# Patient Record
Sex: Male | Born: 1975 | Race: Black or African American | Hispanic: No | Marital: Single | Smoking: Former smoker
Health system: Southern US, Community
[De-identification: ages and names within clinical notes are randomized; demographics above are authoritative.]

## PROBLEM LIST (undated history)

## (undated) DIAGNOSIS — Z9225 Personal history of immunosupression therapy: Secondary | ICD-10-CM

## (undated) DIAGNOSIS — D709 Neutropenia, unspecified: Secondary | ICD-10-CM

## (undated) DIAGNOSIS — M069 Rheumatoid arthritis, unspecified: Secondary | ICD-10-CM

## (undated) DIAGNOSIS — L819 Disorder of pigmentation, unspecified: Secondary | ICD-10-CM

## (undated) HISTORY — DX: Disorder of pigmentation, unspecified: L81.9

## (undated) HISTORY — DX: Neutropenia, unspecified: D70.9

## (undated) HISTORY — DX: Rheumatoid arthritis, unspecified: M06.9

## (undated) HISTORY — DX: Personal history of immunosuppression therapy: Z92.25

---

## 2007-12-11 ENCOUNTER — Emergency Department: Payer: Self-pay | Admitting: Emergency Medicine

## 2008-01-12 ENCOUNTER — Ambulatory Visit: Payer: Self-pay | Admitting: Internal Medicine

## 2008-09-10 ENCOUNTER — Ambulatory Visit: Payer: Self-pay | Admitting: Internal Medicine

## 2008-10-01 ENCOUNTER — Ambulatory Visit: Payer: Self-pay | Admitting: Internal Medicine

## 2008-10-10 ENCOUNTER — Ambulatory Visit: Payer: Self-pay | Admitting: Internal Medicine

## 2008-11-26 ENCOUNTER — Ambulatory Visit: Payer: Self-pay | Admitting: Internal Medicine

## 2008-12-11 ENCOUNTER — Ambulatory Visit: Payer: Self-pay | Admitting: Internal Medicine

## 2008-12-18 ENCOUNTER — Ambulatory Visit: Payer: Self-pay | Admitting: Internal Medicine

## 2009-01-10 ENCOUNTER — Ambulatory Visit: Payer: Self-pay | Admitting: Internal Medicine

## 2016-07-16 ENCOUNTER — Other Ambulatory Visit: Payer: Self-pay | Admitting: Internal Medicine

## 2016-07-16 DIAGNOSIS — N289 Disorder of kidney and ureter, unspecified: Secondary | ICD-10-CM

## 2016-07-21 ENCOUNTER — Ambulatory Visit
Admission: RE | Admit: 2016-07-21 | Discharge: 2016-07-21 | Disposition: A | Discharge status: Home / Self Care | Payer: Self-pay | Source: Ambulatory Visit | Attending: Internal Medicine | Admitting: Internal Medicine

## 2016-07-21 DIAGNOSIS — N289 Disorder of kidney and ureter, unspecified: Secondary | ICD-10-CM | POA: Insufficient documentation

## 2019-07-09 ENCOUNTER — Ambulatory Visit: Payer: Self-pay | Attending: Internal Medicine

## 2019-07-09 DIAGNOSIS — Z23 Encounter for immunization: Secondary | ICD-10-CM

## 2019-07-09 NOTE — Progress Notes (Signed)
   Covid-19 Vaccination Clinic  Name:  Sean [name]    MRN: [id number] DOB: 04-12-1976  07/09/2019  Sean [name] was observed post Covid-19 immunization for 15 minutes without incident. He was provided with Vaccine Information Sheet and instruction to access the V-Safe system.   Sean [name] was instructed to call 911 with any severe reactions post vaccine: Marland Kitchen Difficulty breathing  . Swelling of face and throat  . A fast heartbeat  . A bad rash all over body  . Dizziness and weakness   Immunizations Administered    Name Date Dose VIS Date Route   Pfizer COVID-19 Vaccine 07/09/2019 10:53 AM 0.3 mL 03/23/2019 Intramuscular   Manufacturer: [employer], [employer]   Lot: [id number]   NDC: [address]-8

## 2019-07-30 ENCOUNTER — Ambulatory Visit: Payer: Self-pay

## 2019-07-30 ENCOUNTER — Ambulatory Visit: Payer: Self-pay | Attending: Internal Medicine

## 2019-07-30 DIAGNOSIS — Z23 Encounter for immunization: Secondary | ICD-10-CM

## 2019-07-30 NOTE — Progress Notes (Signed)
   Covid-19 Vaccination Clinic  Name:  Sean [name]    MRN: [id number] DOB: 1976/02/01  07/30/2019  [name] was observed post Covid-19 immunization for 15 minutes without incident. He was provided with Vaccine Information Sheet and instruction to access the V-Safe system.   [name] was instructed to call 911 with any severe reactions post vaccine: Marland Kitchen Difficulty breathing  . Swelling of face and throat  . A fast heartbeat  . A bad rash all over body  . Dizziness and weakness   Immunizations Administered    Name Date Dose VIS Date Route   Pfizer COVID-19 Vaccine 07/30/2019 12:08 PM 0.3 mL 06/06/2018 Intramuscular   Manufacturer: [employer], [employer]   Lot: [id number]   NDC: [address]-5

## 2021-03-26 DIAGNOSIS — M0510 Rheumatoid lung disease with rheumatoid arthritis of unspecified site: Secondary | ICD-10-CM | POA: Diagnosis present

## 2021-03-26 DIAGNOSIS — M0579 Rheumatoid arthritis with rheumatoid factor of multiple sites without organ or systems involvement: Secondary | ICD-10-CM | POA: Diagnosis present

## 2021-03-26 DIAGNOSIS — R7989 Other specified abnormal findings of blood chemistry: Secondary | ICD-10-CM

## 2021-03-26 DIAGNOSIS — D709 Neutropenia, unspecified: Secondary | ICD-10-CM | POA: Insufficient documentation

## 2021-04-24 ENCOUNTER — Other Ambulatory Visit: Payer: Self-pay | Admitting: *Deleted

## 2021-04-24 ENCOUNTER — Encounter: Payer: Self-pay | Admitting: *Deleted

## 2021-04-29 ENCOUNTER — Inpatient Hospital Stay: Payer: Self-pay | Admitting: Oncology

## 2021-04-29 ENCOUNTER — Other Ambulatory Visit: Payer: Self-pay

## 2021-04-29 ENCOUNTER — Inpatient Hospital Stay: Payer: Self-pay

## 2021-04-29 ENCOUNTER — Encounter (INDEPENDENT_AMBULATORY_CARE_PROVIDER_SITE_OTHER): Payer: Self-pay

## 2021-04-29 ENCOUNTER — Other Ambulatory Visit: Payer: Self-pay | Admitting: *Deleted

## 2021-04-29 ENCOUNTER — Inpatient Hospital Stay: Payer: Self-pay | Attending: Oncology | Admitting: Oncology

## 2021-04-29 ENCOUNTER — Encounter: Payer: Self-pay | Admitting: Oncology

## 2021-04-29 VITALS — BP 129/85 | HR 85 | Temp 98.5°F | Resp 16 | Ht 77.0 in | Wt 197.2 lb

## 2021-04-29 DIAGNOSIS — D708 Other neutropenia: Secondary | ICD-10-CM

## 2021-04-29 DIAGNOSIS — D709 Neutropenia, unspecified: Secondary | ICD-10-CM | POA: Insufficient documentation

## 2021-04-29 DIAGNOSIS — Z87891 Personal history of nicotine dependence: Secondary | ICD-10-CM | POA: Insufficient documentation

## 2021-04-29 LAB — CBC WITH DIFFERENTIAL/PLATELET
Abs Immature Granulocytes: 0.01 10*3/uL (ref 0.00–0.07)
Basophils Absolute: 0 10*3/uL (ref 0.0–0.1)
Basophils Relative: 0 %
Eosinophils Absolute: 0 10*3/uL (ref 0.0–0.5)
Eosinophils Relative: 0 %
HCT: 33.5 % — ABNORMAL LOW (ref 39.0–52.0)
Hemoglobin: 11.2 g/dL — ABNORMAL LOW (ref 13.0–17.0)
Immature Granulocytes: 0 %
Lymphocytes Relative: 10 %
Lymphs Abs: 0.4 10*3/uL — ABNORMAL LOW (ref 0.7–4.0)
MCH: 31.4 pg (ref 26.0–34.0)
MCHC: 33.4 g/dL (ref 30.0–36.0)
MCV: 93.8 fL (ref 80.0–100.0)
Monocytes Absolute: 0.5 10*3/uL (ref 0.1–1.0)
Monocytes Relative: 11 %
Neutro Abs: 3.2 10*3/uL (ref 1.7–7.7)
Neutrophils Relative %: 79 %
Platelets: 221 10*3/uL (ref 150–400)
RBC: 3.57 MIL/uL — ABNORMAL LOW (ref 4.22–5.81)
RDW: 14.5 % (ref 11.5–15.5)
WBC: 4.1 10*3/uL (ref 4.0–10.5)
nRBC: 0 % (ref 0.0–0.2)

## 2021-04-29 LAB — TECHNOLOGIST SMEAR REVIEW: Plt Morphology: ADEQUATE

## 2021-04-29 LAB — HEPATITIS B SURFACE ANTIGEN: Hepatitis B Surface Ag: NONREACTIVE

## 2021-04-29 LAB — HEPATITIS B CORE ANTIBODY, TOTAL: Hep B Core Total Ab: NONREACTIVE

## 2021-04-29 LAB — FOLATE: Folate: 13.2 ng/mL (ref >5.9)

## 2021-04-29 LAB — HEPATITIS C ANTIBODY: HCV Ab: NONREACTIVE

## 2021-04-29 LAB — VITAMIN B12: Vitamin B-12: 277 pg/mL (ref 180–914)

## 2021-04-29 NOTE — Progress Notes (Signed)
Hematology/Oncology Consult note [facility] Telephone:([phone] Fax:([phone]) [phone]  Patient Care Team: Casilda Carls, MD as PCP - General Sindy Guadeloupe, MD as Consulting Physician (Hematology and Oncology) Quintin Alto, MD as Consulting Physician (Rheumatology)   Name of the patient: Sean [name]  [id number]  Jul 14, 1975    Reason for referral-neutropenia   Referring physician-Dr. Erskine Squibb  Date of visit: 04/29/21   History of presenting illness- Patient is a 46 year old male who was seen by rheumatology Dr. Posey Pronto for history of stage II rheumatoid arthritis.  He was taking Plaquenil and prednisone for that but stopped it for a while before restarting it again.  He has been referred to [place] for neutropenia.  Most recent CBC from 04/21/2021 showed a white count of 3, H&H of 11.2/33.8 with a platelet count of 216.  Differential mainly showed both lymphopenia and neutropenia with an ANC of 1.3 and a lymphocyte count of 980.  Relative monocytosis with absolute monocyte count was normal.  ESR elevated at 54.  Prior to that in November 2022 his white count was 2.7 as well.  Currently reports some ongoing fatigue as well as joint pain and skin rash.  Denies any recurrent infections or hospitalizations.  ECOG PS- 1  Pain scale- 3   Review of systems- Review of Systems  Constitutional:  Positive for malaise/fatigue. Negative for chills, fever and weight loss.  HENT:  Negative for congestion, ear discharge and nosebleeds.   Eyes:  Negative for blurred vision.  Respiratory:  Negative for cough, hemoptysis, sputum production, shortness of breath and wheezing.   Cardiovascular:  Negative for chest pain, palpitations, orthopnea and claudication.  Gastrointestinal:  Negative for abdominal pain, blood in stool, constipation, diarrhea, heartburn, melena, nausea and vomiting.  Genitourinary:  Negative for dysuria, flank pain, frequency, hematuria and urgency.   Musculoskeletal:  Positive for joint pain. Negative for back pain and myalgias.  Skin:  Negative for rash.  Neurological:  Negative for dizziness, tingling, focal weakness, seizures, weakness and headaches.  Endo/Heme/Allergies:  Does not bruise/bleed easily.  Psychiatric/Behavioral:  Negative for depression and suicidal ideas. The patient does not have insomnia.    No Known Allergies  There are no problems to display for this patient.    Past Medical History:  Diagnosis Date   H/O immunosuppressive therapy    Hyperpigmentation of skin    Hyperpigmentation rash/vesicles of the palm and face   Neutropenia ([place])    RA (rheumatoid arthritis) ([place])      History reviewed. No pertinent surgical history.  Social History   Socioeconomic History   Marital status: Single    Spouse name: Not on file   Number of children: Not on file   Years of education: Not on file   Highest education level: Not on file  Occupational History   Not on file  Tobacco Use   Smoking status: Former    Types: Cigarettes   Smokeless tobacco: Never  Substance and Sexual Activity   Alcohol use: Not Currently   Drug use: Not Currently    Types: Marijuana   Sexual activity: Not on file  Other Topics Concern   Not on file  Social History Narrative   Not on file   Social Determinants of Health   Financial Resource Strain: Not on file  Food Insecurity: Not on file  Transportation Needs: Not on file  Physical Activity: Not on file  Stress: Not on file  Social Connections: Not on file  Intimate Partner  Violence: Not on file     No family history on file.   Current Outpatient Medications:    hydroxychloroquine (PLAQUENIL) 200 MG tablet, Take 1 tablet by mouth 2 (two) times daily., Disp: , Rfl:    predniSONE (DELTASONE) 20 MG tablet, Take by mouth., Disp: , Rfl:    Physical exam:  Vitals:   04/29/21 1548  BP: 129/85  Pulse: 85  Resp: 16  Temp: 98.5 F (36.9 C)  TempSrc: Tympanic   SpO2: 100%  Weight: 197 lb 3.2 oz (89.4 kg)  Height: '6\' 5"'  (1.956 m)   Physical Exam Constitutional:      General: He is not in acute distress. Cardiovascular:     Rate and Rhythm: Normal rate and regular rhythm.     Heart sounds: Normal heart sounds.  Pulmonary:     Effort: Pulmonary effort is normal.     Breath sounds: Normal breath sounds.  Abdominal:     General: Bowel sounds are normal.     Palpations: Abdomen is soft.     Comments: No palpable hepatosplenomegaly  Musculoskeletal:     Cervical back: Normal range of motion.  Lymphadenopathy:     Comments: No palpable cervical, supraclavicular, axillary or inguinal adenopathy    Skin:    Comments: Maculopapular rash noted over bilateral hands  Neurological:     Mental Status: He is alert and oriented to person, place, and time.      Assessment and plan- Patient is a 46 y.o. male referred for leukopenia/neutropenia  From 3.4 in October 20 22-2.7 in November and 3 in January 2023.  Differential mainly shows neutropenia that is mild with an ANC of 1.3 and lymphopenia.  Although Plaquenil can rarely be associated with leukopenia this could also be suggestive of autoimmune neutropenia given that patient has concomitant rheumatoid arthritis.  Presently his neutropenia is mild and patient does not have any recurrent infections.  I will check CBC with differential, smear review, B12 folate and hepatitis C testing.  HIV testing in November 2022 was negative.    Clinically patient does not seem to have splenomegaly  For now I plan is to watch his neutrophil counts conservatively without the need for a bone marrow biopsy.  If there is a continued downward trend in his neutrophil count that can be considered.  Video visit with me in 2 weeks time  Patient's white cell count over the last 4 months has been waxing and waning   Thank you for this kind referral and the opportunity to participate in the care of this patient   Visit  Diagnosis 1. Other neutropenia ([place])     Dr. Randa Evens, MD, MPH [facility] at [facility] [phone] 04/29/2021

## 2021-04-30 LAB — HEPATITIS B SURFACE ANTIBODY, QUANTITATIVE: Hep B S AB Quant (Post): <3.1 m[IU]/mL — ABNORMAL LOW (ref >9.9)

## 2021-05-18 ENCOUNTER — Inpatient Hospital Stay: Payer: Self-pay | Attending: Oncology | Admitting: Oncology

## 2021-05-18 ENCOUNTER — Other Ambulatory Visit: Payer: Self-pay

## 2021-05-18 ENCOUNTER — Encounter: Payer: Self-pay | Admitting: Oncology

## 2021-05-18 DIAGNOSIS — D708 Other neutropenia: Secondary | ICD-10-CM

## 2021-05-18 DIAGNOSIS — E538 Deficiency of other specified B group vitamins: Secondary | ICD-10-CM

## 2021-05-18 NOTE — Progress Notes (Signed)
I connected with Sean [name] on 05/18/21 at 11:30 AM EST by video enabled telemedicine visit and verified that I am speaking with the correct person using two identifiers.   I discussed the limitations, risks, security and privacy concerns of performing an evaluation and management service by telemedicine and the availability of in-person appointments. I also discussed with the patient that there may be a patient responsible charge related to this service. The patient expressed understanding and agreed to proceed.  Other persons participating in the visit and their role in the encounter:  none  Patient's location:  work Provider's location:  work  Risk analyst Complaint: Discuss results of blood work  History of present illness:  Patient is a 46 year old male who was seen by rheumatology Dr. Posey Pronto for history of stage II rheumatoid arthritis.  He was taking Plaquenil and prednisone for that but stopped it for a while before restarting it again.  He has been referred to [place] for neutropenia.  Most recent CBC from 04/21/2021 showed a white count of 3, H&H of 11.2/33.8 with a platelet count of 216.  Differential mainly showed both lymphopenia and neutropenia with an ANC of 1.3 and a lymphocyte count of 980.  Relative monocytosis with absolute monocyte count was normal.  ESR elevated at 54.  Prior to that in November 2022 his white count was 2.7 as well.  Currently reports some ongoing fatigue as well as joint pain and skin rash.  Denies any recurrent infections or hospitalizations.   Results of blood work from 04/29/2021 showed a white cell count of 4.1 with an [place] of 3.2.  Mild lymphopenia with absolute lymphocyte count of 0.4.  Hepatitis B and C testing negative.  Folate levels were normal and B12 level mildly low at 277.  Interval history patient is doing well and denies any specific complaints at this time other than mild fatigue   Review of Systems  Constitutional:  Negative for chills, fever,  malaise/fatigue and weight loss.  HENT:  Negative for congestion, ear discharge and nosebleeds.   Eyes:  Negative for blurred vision.  Respiratory:  Negative for cough, hemoptysis, sputum production, shortness of breath and wheezing.   Cardiovascular:  Negative for chest pain, palpitations, orthopnea and claudication.  Gastrointestinal:  Negative for abdominal pain, blood in stool, constipation, diarrhea, heartburn, melena, nausea and vomiting.  Genitourinary:  Negative for dysuria, flank pain, frequency, hematuria and urgency.  Musculoskeletal:  Negative for back pain, joint pain and myalgias.  Skin:  Negative for rash.  Neurological:  Negative for dizziness, tingling, focal weakness, seizures, weakness and headaches.  Endo/Heme/Allergies:  Does not bruise/bleed easily.  Psychiatric/Behavioral:  Negative for depression and suicidal ideas. The patient does not have insomnia.    No Known Allergies  Past Medical History:  Diagnosis Date   H/O immunosuppressive therapy    Hyperpigmentation of skin    Hyperpigmentation rash/vesicles of the palm and face   Neutropenia ([facility])    RA (rheumatoid arthritis) ([place])     History reviewed. No pertinent surgical history.  Social History   Socioeconomic History   Marital status: Single    Spouse name: Not on file   Number of children: Not on file   Years of education: Not on file   Highest education level: Not on file  Occupational History   Not on file  Tobacco Use   Smoking status: Former    Types: Cigarettes   Smokeless tobacco: Never  Substance and Sexual Activity   Alcohol use: Not Currently  Drug use: Not Currently    Types: Marijuana   Sexual activity: Not on file  Other Topics Concern   Not on file  Social History Narrative   Not on file   Social Determinants of Health   Financial Resource Strain: Not on file  Food Insecurity: Not on file  Transportation Needs: Not on file  Physical Activity: Not on file  Stress: Not  on file  Social Connections: Not on file  Intimate Partner Violence: Not on file    Family History  Problem Relation Age of Onset   Hyperlipidemia Mother    Hypertension Father      Current Outpatient Medications:    hydroxychloroquine (PLAQUENIL) 200 MG tablet, Take 1 tablet by mouth 2 (two) times daily., Disp: , Rfl:    predniSONE (DELTASONE) 20 MG tablet, Take by mouth., Disp: , Rfl:   No results found.  No images are attached to the encounter.   No flowsheet data found. CBC Latest Ref Rng & Units 04/29/2021  WBC 4.0 - 10.5 K/uL 4.1  Hemoglobin 13.0 - 17.0 g/dL 11.2(L)  Hematocrit 39.0 - 52.0 % 33.5(L)  Platelets 150 - 400 K/uL 221     Observation/objective: Appears in no acute distress over video visit today.  Breathing is nonlabored  Assessment and plan: Patient is a 46 year old African-American male referred for neutropenia  Patient's white cell count has been waxing and waning with mild intermittent neutropenia.  No other cytopenias.  Hepatitis B and C testing negative.  HIV testing negative in the past.  B12 levels are mildly low at 277 and asked him to take oral B12 1000 mcg daily.  Overall given that his neutropenia is mild and intermittent he does not require a bone marrow biopsy at this time.  We will repeat CBC with differential in 6 months and see him thereafter.  If overall counts are stable he does not need to follow-up with me subsequently  Follow-up instructions: As above  I discussed the assessment and treatment plan with the patient. The patient was provided an opportunity to ask questions and all were answered. The patient agreed with the plan and demonstrated an understanding of the instructions.   The patient was advised to call back or seek an in-person evaluation if the symptoms worsen or if the condition fails to improve as anticipated.    Visit Diagnosis: 1. Other neutropenia ([place])   2. B12 deficiency     Dr. Randa Evens, MD, MPH [facility] at  [facility] Tel- [phone] 05/18/2021 2:15 PM

## 2021-06-02 ENCOUNTER — Other Ambulatory Visit: Payer: Self-pay | Admitting: Rheumatology

## 2021-06-02 DIAGNOSIS — M0579 Rheumatoid arthritis with rheumatoid factor of multiple sites without organ or systems involvement: Secondary | ICD-10-CM

## 2021-06-12 ENCOUNTER — Ambulatory Visit: Payer: 59

## 2021-07-10 ENCOUNTER — Ambulatory Visit
Admission: RE | Admit: 2021-07-10 | Discharge: 2021-07-10 | Disposition: A | Discharge status: Home / Self Care | Payer: 59 | Source: Ambulatory Visit | Attending: Rheumatology | Admitting: Rheumatology

## 2021-07-10 ENCOUNTER — Other Ambulatory Visit: Payer: Self-pay

## 2021-07-10 DIAGNOSIS — N2889 Other specified disorders of kidney and ureter: Secondary | ICD-10-CM | POA: Insufficient documentation

## 2021-07-10 DIAGNOSIS — M0579 Rheumatoid arthritis with rheumatoid factor of multiple sites without organ or systems involvement: Secondary | ICD-10-CM | POA: Diagnosis present

## 2021-07-10 DIAGNOSIS — J90 Pleural effusion, not elsewhere classified: Secondary | ICD-10-CM | POA: Insufficient documentation

## 2021-07-15 ENCOUNTER — Other Ambulatory Visit: Payer: Self-pay | Admitting: Pulmonary Disease

## 2021-07-15 DIAGNOSIS — R0609 Other forms of dyspnea: Secondary | ICD-10-CM

## 2021-07-15 DIAGNOSIS — J849 Interstitial pulmonary disease, unspecified: Secondary | ICD-10-CM

## 2021-07-24 ENCOUNTER — Ambulatory Visit
Admission: RE | Admit: 2021-07-24 | Discharge: 2021-07-24 | Disposition: A | Discharge status: Home / Self Care | Payer: 59 | Source: Ambulatory Visit | Attending: Pulmonary Disease | Admitting: Pulmonary Disease

## 2021-07-24 DIAGNOSIS — J849 Interstitial pulmonary disease, unspecified: Secondary | ICD-10-CM | POA: Diagnosis present

## 2021-07-24 DIAGNOSIS — R0609 Other forms of dyspnea: Secondary | ICD-10-CM | POA: Insufficient documentation

## 2021-07-24 MED ORDER — IOHEXOL 300 MG/ML  SOLN
75.0000 mL | Freq: Once | INTRAMUSCULAR | Status: AC | PRN
  Administered 2021-07-24: 75 mL via INTRAVENOUS

## 2021-07-25 ENCOUNTER — Other Ambulatory Visit: Payer: Self-pay

## 2021-07-25 ENCOUNTER — Observation Stay: Payer: 59

## 2021-07-25 ENCOUNTER — Observation Stay
Admission: EM | Admit: 2021-07-25 | Discharge: 2021-07-27 | Disposition: A | Discharge status: Home / Self Care | Payer: 59 | Attending: Internal Medicine | Admitting: Internal Medicine

## 2021-07-25 ENCOUNTER — Emergency Department: Payer: 59

## 2021-07-25 DIAGNOSIS — Z20822 Contact with and (suspected) exposure to covid-19: Secondary | ICD-10-CM | POA: Diagnosis not present

## 2021-07-25 DIAGNOSIS — R0902 Hypoxemia: Secondary | ICD-10-CM

## 2021-07-25 DIAGNOSIS — Z87891 Personal history of nicotine dependence: Secondary | ICD-10-CM | POA: Diagnosis not present

## 2021-07-25 DIAGNOSIS — R7401 Elevation of levels of liver transaminase levels: Secondary | ICD-10-CM | POA: Insufficient documentation

## 2021-07-25 DIAGNOSIS — M0510 Rheumatoid lung disease with rheumatoid arthritis of unspecified site: Secondary | ICD-10-CM | POA: Diagnosis present

## 2021-07-25 DIAGNOSIS — R7989 Other specified abnormal findings of blood chemistry: Secondary | ICD-10-CM

## 2021-07-25 DIAGNOSIS — J90 Pleural effusion, not elsewhere classified: Secondary | ICD-10-CM | POA: Diagnosis not present

## 2021-07-25 DIAGNOSIS — Z79899 Other long term (current) drug therapy: Secondary | ICD-10-CM | POA: Diagnosis not present

## 2021-07-25 DIAGNOSIS — M0579 Rheumatoid arthritis with rheumatoid factor of multiple sites without organ or systems involvement: Secondary | ICD-10-CM | POA: Diagnosis present

## 2021-07-25 DIAGNOSIS — M069 Rheumatoid arthritis, unspecified: Secondary | ICD-10-CM | POA: Diagnosis not present

## 2021-07-25 DIAGNOSIS — R0602 Shortness of breath: Secondary | ICD-10-CM | POA: Diagnosis present

## 2021-07-25 DIAGNOSIS — Z9889 Other specified postprocedural states: Secondary | ICD-10-CM

## 2021-07-25 DIAGNOSIS — Z7952 Long term (current) use of systemic steroids: Secondary | ICD-10-CM | POA: Diagnosis not present

## 2021-07-25 DIAGNOSIS — J189 Pneumonia, unspecified organism: Secondary | ICD-10-CM

## 2021-07-25 LAB — BASIC METABOLIC PANEL
Anion gap: 6 (ref 5–15)
BUN: 17 mg/dL (ref 6–20)
CO2: 26 mmol/L (ref 22–32)
Calcium: 8.6 mg/dL — ABNORMAL LOW (ref 8.9–10.3)
Chloride: 102 mmol/L (ref 98–111)
Creatinine, Ser: 0.85 mg/dL (ref 0.61–1.24)
GFR, Estimated: >60 mL/min (ref >60)
Glucose, Bld: 155 mg/dL — ABNORMAL HIGH (ref 70–99)
Potassium: 3.9 mmol/L (ref 3.5–5.1)
Sodium: 134 mmol/L — ABNORMAL LOW (ref 135–145)

## 2021-07-25 LAB — CBC WITH DIFFERENTIAL/PLATELET
Abs Immature Granulocytes: 0.02 10*3/uL (ref 0.00–0.07)
Basophils Absolute: 0 10*3/uL (ref 0.0–0.1)
Basophils Relative: 0 %
Eosinophils Absolute: 0.1 10*3/uL (ref 0.0–0.5)
Eosinophils Relative: 1 %
HCT: 41.9 % (ref 39.0–52.0)
Hemoglobin: 13.9 g/dL (ref 13.0–17.0)
Immature Granulocytes: 1 %
Lymphocytes Relative: 14 %
Lymphs Abs: 0.6 10*3/uL — ABNORMAL LOW (ref 0.7–4.0)
MCH: 31.1 pg (ref 26.0–34.0)
MCHC: 33.2 g/dL (ref 30.0–36.0)
MCV: 93.7 fL (ref 80.0–100.0)
Monocytes Absolute: 0.4 10*3/uL (ref 0.1–1.0)
Monocytes Relative: 10 %
Neutro Abs: 3.2 10*3/uL (ref 1.7–7.7)
Neutrophils Relative %: 74 %
Platelets: 162 10*3/uL (ref 150–400)
RBC: 4.47 MIL/uL (ref 4.22–5.81)
RDW: 13.2 % (ref 11.5–15.5)
WBC: 4.3 10*3/uL (ref 4.0–10.5)
nRBC: 0 % (ref 0.0–0.2)

## 2021-07-25 LAB — HEPATIC FUNCTION PANEL
ALT: 132 U/L — ABNORMAL HIGH (ref 0–44)
AST: 99 U/L — ABNORMAL HIGH (ref 15–41)
Albumin: 2.8 g/dL — ABNORMAL LOW (ref 3.5–5.0)
Alkaline Phosphatase: 56 U/L (ref 38–126)
Bilirubin, Direct: <0.1 mg/dL (ref 0.0–0.2)
Total Bilirubin: 0.3 mg/dL (ref 0.3–1.2)
Total Protein: 6.7 g/dL (ref 6.5–8.1)

## 2021-07-25 LAB — RESP PANEL BY RT-PCR (FLU A&B, COVID) ARPGX2
Influenza A by PCR: NEGATIVE
Influenza B by PCR: NEGATIVE
SARS Coronavirus 2 by RT PCR: NEGATIVE

## 2021-07-25 LAB — D-DIMER, QUANTITATIVE: D-Dimer, Quant: 6.01 ug/mL-FEU — ABNORMAL HIGH (ref 0.00–0.50)

## 2021-07-25 LAB — TROPONIN I (HIGH SENSITIVITY): Troponin I (High Sensitivity): 7 ng/L (ref <18)

## 2021-07-25 LAB — PROCALCITONIN: Procalcitonin: <0.1 ng/mL

## 2021-07-25 LAB — SEDIMENTATION RATE: Sed Rate: 50 mm/hr — ABNORMAL HIGH (ref 0–16)

## 2021-07-25 MED ORDER — IOHEXOL 350 MG/ML SOLN
75.0000 mL | Freq: Once | INTRAVENOUS | Status: AC | PRN
  Administered 2021-07-25: 75 mL via INTRAVENOUS

## 2021-07-25 MED ORDER — HEPARIN SODIUM (PORCINE) 5000 UNIT/ML IJ SOLN
5000.0000 [IU] | Freq: Three times a day (TID) | INTRAMUSCULAR | Status: DC
  Administered 2021-07-25 – 2021-07-27 (×6): 5000 [IU] via SUBCUTANEOUS
  Filled 2021-07-25 (×6): qty 1

## 2021-07-25 MED ORDER — SODIUM CHLORIDE 0.9 % IV SOLN: 1.0000 g | INTRAVENOUS | Status: DC

## 2021-07-25 MED ORDER — ALBUTEROL SULFATE (2.5 MG/3ML) 0.083% IN NEBU
3.0000 mL | INHALATION_SOLUTION | Freq: Four times a day (QID) | RESPIRATORY_TRACT | Status: DC | PRN
Start: 2021-07-25 — End: 2021-07-27

## 2021-07-25 MED ORDER — SODIUM CHLORIDE 0.9 % IV SOLN: 500.0000 mg | INTRAVENOUS | Status: DC

## 2021-07-25 MED ORDER — ALBUTEROL SULFATE (2.5 MG/3ML) 0.083% IN NEBU: 2.5000 mg | INHALATION_SOLUTION | RESPIRATORY_TRACT | Status: DC | PRN

## 2021-07-25 MED ORDER — SODIUM CHLORIDE 0.9 % IV SOLN
500.0000 mg | Freq: Once | INTRAVENOUS | Status: AC
Start: 2021-07-25 — End: 2021-07-25
  Administered 2021-07-25: 500 mg via INTRAVENOUS
  Filled 2021-07-25: qty 5

## 2021-07-25 MED ORDER — SODIUM CHLORIDE 0.9 % IV SOLN
1.0000 g | Freq: Once | INTRAVENOUS | Status: AC
  Administered 2021-07-25: 1 g via INTRAVENOUS
  Filled 2021-07-25: qty 10

## 2021-07-25 NOTE — Assessment & Plan Note (Deleted)
Uncertain if this is PNA or Rheum manifestation of RA/RV with pulmonary involvement.  ?CTA chest ordered due to VTE risk in his case shows: ?IMPRESSION: ?1. No evidence of pulmonary embolus. ?2. Persistent multifocal bibasilar airspace disease, with suspected ?areas of cavitation within the consolidated lower lobes. Findings ?could be related to aspiration pneumonia or infection. Atypical ?infection should be considered given history of rheumatoid arthritis ?and immunosuppressive therapy. No change since CT performed ?yesterday. ?3. Small left pleural effusion.  Trace right pleural fluid. ?4. Likely reactive lymph nodes within the mediastinum. ? ? ?

## 2021-07-25 NOTE — ED Notes (Signed)
Pt to ED via POV c/o shortness of breath and low oxygen level. Pt is in NAD at this time. Respirations are equal and unlabored. Pt is able to speak in complete sentences with no accessory muscle use noted.  ?

## 2021-07-25 NOTE — H&P (Signed)
?History and Physical  ? ? ?Patient: Sean [name] [id number] DOB: 09/17/1975 ?DOA: 07/25/2021 ?DOS: the patient was seen and examined on 07/26/2021 ?PCP: Casilda Carls, MD  ?Patient coming from: Home ? ?Chief Complaint:  ?Chief Complaint  ?Patient presents with  ? Shortness of Breath  ? ?HPI: Sean [name] is a 46 y.o. male with medical history significant of RA/ ? 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 That started 2 months ago. He has been  Sob and is out of work since 2 months as well.  ? ?Duration:: 2 months ? ?Frequency:every day ? ?Location:N/A. ? ?Quality:N/A ? ?Rate:10/10 ? ?Radiation:N/A ? ?Aggravating:worse with standing  and exertion. ? ?Alleviating:rest ? ?Associated factors:None  ?Pt has started to have symptoms of skin and joints since October last year, he has been told multiple diagnosis. before they found his RA.  ?Pt has skin ulceration and also on his hands and fingers.  ?Pt follows up with rheumatologist . ?Have advised him to take pictures or progressing lesions as well as healing lesion. ?Patient has been told he has Lyme disease then he was told he has syphilis because of the ulcers of his fingers.  Clinically he appears vasculitic symptoms. With fingertip ulceration. And pulmonary nodules  Patient also has manifestations of rheumatoid arthritis.  The joints of his hands are thickened and swollen.  Pulses in his hands are within normal limits as are his feet.  Discussed with him that although his presentation is unlikely a PE, he is at high risk and we should evaluate him with a CT angio today.  Inform patient that we have requested consult from pulmonary Dr. Lanney Gins who he saw last month. ?Discussed with patient and family to seek follow-up with dermatology as well as continue follow-up with rheumatology.  Patient has also been on chronic prednisone since October. ? ? ? ?Review of Systems: Review of Systems  ?HENT:  Positive for sore throat.   ?     Pt went to ENT who  examined him and said it was benign- [place]. ?PT was given dukes magic mouthwash by dr. Posey Pronto the rheumatologist.   ?Respiratory:  Positive for shortness of breath.   ?All other systems reviewed and are negative. ? ?Past Medical History:  ?Diagnosis Date  ? H/O immunosuppressive therapy   ? Hyperpigmentation of skin   ? Hyperpigmentation rash/vesicles of the palm and face  ? Neutropenia ([place])   ? RA (rheumatoid arthritis) ([place])   ? ?History reviewed. No pertinent surgical history. ?Social History:  reports that he has quit smoking. His smoking use included cigarettes. He has never used smokeless tobacco. He reports that he does not currently use alcohol. He reports that he does not currently use drugs after having used the following drugs: Marijuana. ? ?No Known Allergies ? ?Family History  ?Problem Relation Age of Onset  ? Hyperlipidemia Mother   ? Hypertension Father   ? ? ?Prior to Admission medications   ?Medication Sig Start Date End Date Taking? Authorizing Provider  ?folic acid (FOLVITE) 1 MG tablet Take 1 mg by mouth daily. 07/09/21  Yes [provider]  ?hydroxychloroquine (PLAQUENIL) 200 MG tablet Take 1 tablet by mouth 2 (two) times daily. 03/26/21  Yes [provider]  ?methotrexate 2.5 MG tablet Take 5 tablets by mouth every 7 (seven) days. 07/09/21  Yes [provider]  ?Vitamin D, Ergocalciferol, (DRISDOL) 1.25 MG (50000 UNIT) CAPS capsule Take 50,000 Units by mouth once a week. 07/06/21  Yes  [provider]  ? ? ?Physical Exam: ?Vitals:  ? 07/25/21 1800 07/25/21 1830 07/25/21 1900 07/25/21 2024  ?BP: 108/69 110/74 126/81 118/71  ?Pulse: 91 88 96 93  ?Resp: 18  19 16   ?Temp:    97.9 ?F (36.6 ?C)  ?TempSrc:    Oral  ?SpO2: 94% 94% 96% 97%  ?Weight:    79.3 kg  ?Height:    6\' 5"  (1.956 m)  ?Physical Exam ?Vitals and nursing note reviewed.  ?Constitutional:   ?   General: He is not in acute distress. ?   Appearance: Normal appearance. He is not ill-appearing,  toxic-appearing or diaphoretic.  ?HENT:  ?   Head: Normocephalic and atraumatic.  ?   Right Ear: Hearing and external ear normal.  ?   Left Ear: Hearing and external ear normal.  ?   Nose: Nose normal. No nasal deformity.  ?   Mouth/Throat:  ?   Lips: Pink.  ?   Mouth: Mucous membranes are moist.  ?   Tongue: No lesions.  ?   Pharynx: Oropharynx is clear.  ?Eyes:  ?   Extraocular Movements: Extraocular movements intact.  ?   Pupils: Pupils are equal, round, and reactive to light.  ?Neck:  ?   Vascular: No carotid bruit.  ?Cardiovascular:  ?   Rate and Rhythm: Normal rate and regular rhythm.  ?   Pulses: Normal pulses.  ?   Heart sounds: Normal heart sounds.  ?Pulmonary:  ?   Effort: Pulmonary effort is normal.  ?   Breath sounds: Examination of the right-lower field reveals wheezing. Examination of the left-lower field reveals wheezing. Wheezing present.  ?Abdominal:  ?   General: Bowel sounds are normal. There is no distension.  ?   Palpations: Abdomen is soft. There is no mass.  ?   Tenderness: There is no abdominal tenderness. There is no guarding.  ?   Hernia: No hernia is present.  ?Musculoskeletal:  ?   Right lower leg: No edema.  ?   Left lower leg: No edema.  ?Skin: ?   General: Skin is warm.  ?Neurological:  ?   General: No focal deficit present.  ?   Mental Status: He is alert and oriented to person, place, and time.  ?   Cranial Nerves: Cranial nerves 2-12 are intact.  ?   Motor: Motor function is intact.  ?Psychiatric:     ?   Attention and Perception: Attention normal.     ?   Mood and Affect: Mood normal.     ?   Speech: Speech normal.     ?   Behavior: Behavior normal. Behavior is cooperative.     ?   Cognition and Memory: Cognition normal.  ? ? ?Data Reviewed: ?Results for orders placed or performed during the hospital encounter of 07/25/21 (from the past 24 hour(s))  ?CBC with Differential     Status: Abnormal  ? Collection Time: 07/25/21  4:08 PM  ?Result Value Ref Range  ? WBC 4.3 4.0 - 10.5 K/uL   ? RBC 4.47 4.22 - 5.81 MIL/uL  ? Hemoglobin 13.9 13.0 - 17.0 g/dL  ? HCT 41.9 39.0 - 52.0 %  ? MCV 93.7 80.0 - 100.0 fL  ? MCH 31.1 26.0 - 34.0 pg  ? MCHC 33.2 30.0 - 36.0 g/dL  ? RDW 13.2 11.5 - 15.5 %  ? Platelets 162 150 - 400 K/uL  ? nRBC 0.0 0.0 - 0.2 %  ? Neutrophils  Relative % 74 %  ? Neutro Abs 3.2 1.7 - 7.7 K/uL  ? Lymphocytes Relative 14 %  ? Lymphs Abs 0.6 (L) 0.7 - 4.0 K/uL  ? Monocytes Relative 10 %  ? Monocytes Absolute 0.4 0.1 - 1.0 K/uL  ? Eosinophils Relative 1 %  ? Eosinophils Absolute 0.1 0.0 - 0.5 K/uL  ? Basophils Relative 0 %  ? Basophils Absolute 0.0 0.0 - 0.1 K/uL  ? Immature Granulocytes 1 %  ? Abs Immature Granulocytes 0.02 0.00 - 0.07 K/uL  ?Basic metabolic panel     Status: Abnormal  ? Collection Time: 07/25/21  4:08 PM  ?Result Value Ref Range  ? Sodium 134 (L) 135 - 145 mmol/L  ? Potassium 3.9 3.5 - 5.1 mmol/L  ? Chloride 102 98 - 111 mmol/L  ? CO2 26 22 - 32 mmol/L  ? Glucose, Bld 155 (H) 70 - 99 mg/dL  ? BUN 17 6 - 20 mg/dL  ? Creatinine, Ser 0.85 0.61 - 1.24 mg/dL  ? Calcium 8.6 (L) 8.9 - 10.3 mg/dL  ? GFR, Estimated >60 >60 mL/min  ? Anion gap 6 5 - 15  ?Resp Panel by RT-PCR (Flu A&B, Covid) Nasopharyngeal Swab     Status: None  ? Collection Time: 07/25/21  5:10 PM  ? Specimen: Nasopharyngeal Swab; Nasopharyngeal(NP) swabs in vial transport medium  ?Result Value Ref Range  ? SARS Coronavirus 2 by RT PCR NEGATIVE NEGATIVE  ? Influenza A by PCR NEGATIVE NEGATIVE  ? Influenza B by PCR NEGATIVE NEGATIVE  ?D-dimer, quantitative     Status: Abnormal  ? Collection Time: 07/25/21  6:29 PM  ?Result Value Ref Range  ? D-Dimer, Quant 6.01 (H) 0.00 - 0.50 ug/mL-FEU  ?Procalcitonin - Baseline     Status: None  ? Collection Time: 07/25/21  6:29 PM  ?Result Value Ref Range  ? Procalcitonin <0.10 ng/mL  ?Sedimentation rate     Status: Abnormal  ? Collection Time: 07/25/21  6:29 PM  ?Result Value Ref Range  ? Sed Rate 50 (H) 0 - 16 mm/hr  ?Troponin I (High Sensitivity)     Status: None  ?  Collection Time: 07/25/21  6:29 PM  ?Result Value Ref Range  ? Troponin I (High Sensitivity) 7 <18 ng/L  ?C-reactive protein     Status: Abnormal  ? Collection Time: 07/25/21  6:29 PM  ?Result Value Ref Range  ? CRP 1.3 (H)

## 2021-07-25 NOTE — ED Provider Notes (Signed)
? ?[facility] ?Provider Note ? ? ? Event Date/Time  ? First MD Initiated Contact with Patient 07/25/21 1652   ?  (approximate) ? ? ?History  ? ?Shortness of Breath ? ? ?HPI ? ?Sean [name] is a 46 y.o. male  who, presents to the emergency department today because of concern for continued shortness of breath.  Patient states he has felt short of breath for roughly 1 month.  He was seen by his PCP who diagnosed him with pneumonia and started him on on doxycycline.  He states he finished that yesterday but continues to be short of breath.  They do portable oximeter at home and they have found him to be dropping into the 80s on room air with exertion.  Patient denies any chest pain.  Did see pulmonologist and had a CT scan done yesterday although had not been read.  Patient denies any fevers.  States he does have history of rheumatoid arthritis. Denies any recent travel. ? ?Physical Exam  ? ?Triage Vital Signs: ?ED Triage Vitals  ?Enc Vitals Group  ?   BP 07/25/21 1610 126/72  ?   Pulse Rate 07/25/21 1610 94  ?   Resp 07/25/21 1610 18  ?   Temp 07/25/21 1610 98.3 ?F (36.8 ?C)  ?   Temp Source 07/25/21 1610 Oral  ?   SpO2 07/25/21 1610 94 %  ?   Weight 07/25/21 1603 178 lb (80.7 kg)  ?   Height 07/25/21 1603  (1.956 m)  ?   Head Circumference --   ?   Peak Flow --   ?   Pain Score 07/25/21 1603 0  ?   Pain Loc --   ?   Pain Edu? --   ?   Excl. in GC? --   ? ? ?Most recent vital signs: ?Vitals:  ? 07/25/21 1610  ?BP: 126/72  ?Pulse: 94  ?Resp: 18  ?Temp: 98.3 ?F (36.8 ?C)  ?SpO2: 94%  ? ? ?General: Awake, alert and oriented. ?CV:  Good peripheral perfusion. Regular rate and rhythm. ?Resp:  Normal effort. Rhonchi in the right lower and left lower lungs, worse on right.  ?Abd:  No distention.  ?MSK:  No lower extremity edema.  ? ? ?ED Results / Procedures / Treatments  ? ?Labs ?(all labs ordered are listed, but only abnormal results are displayed) ?Labs Reviewed  ?CBC WITH  DIFFERENTIAL/PLATELET - Abnormal; Notable for the following components:  ?    Result Value  ? Lymphs Abs 0.6 (*)   ? All other components within normal limits  ?BASIC METABOLIC PANEL - Abnormal; Notable for the following components:  ? Sodium 134 (*)   ? Glucose, Bld 155 (*)   ? Calcium 8.6 (*)   ? All other components within normal limits  ? ? ? ?EKG ? ?IPhineas Semen, attending physician, personally viewed and interpreted this EKG ? ?EKG Time: 1604 ?Rate: 96 ?Rhythm: normal sinus rhythm ?Axis: rightward axis ?Intervals: qtc 434 ?QRS: narrow ?ST changes: no st elevation ?Impression: abnormal ekg ? ?RADIOLOGY ?I independently interpreted and visualized the cxr. My interpretation: lower airspace disease ?Radiology interpretation:  ?  ?IMPRESSION:  ?1. Bibasilar airspace disease consistent with pneumonia.  ?2. Small bilateral pleural effusions.  ? ?PROCEDURES: ? ?Critical Care performed: No ? ?Procedures ? ? ?MEDICATIONS ORDERED IN ED: ?Medications  ?albuterol (PROVENTIL) (2.5 MG/3ML) 0.083% nebulizer solution 2.5 mg (has no administration in time range)  ? ? ? ?IMPRESSION /  MDM / ASSESSMENT AND PLAN / ED COURSE  ?I reviewed the triage vital signs and the nursing notes. ?             ?               ? ?Differential diagnosis includes, but is not limited to, pneumonia, pneumothorax, CHF, PE, ACS. ? ?Patient presented to the emergency department today because of concerns for shortness of breath.  He had been treated as an outpatient for pneumonia with doxycycline.  Patient was found to be hypoxic upon ambulation here in the emergency department.  Chest x-ray is consistent with pneumonia.  Additionally radiology reviewed CT scan performed yesterday.  Is consistent with pneumonia with possible early cavitating lesions.  Patient does have history of rheumatoid arthritis.  Given radiographic findings and hypoxia upon ambulation will plan on admission.  Did start IV antibiotics here in the emergency department.   Discussed with Dr. Allena Katz with hospitalist service who will plan on admission. ? ?FINAL CLINICAL IMPRESSION(S) / ED DIAGNOSES  ? ?Final diagnoses:  ?SOB (shortness of breath)  ?Hypoxia  ?Pneumonia due to infectious organism, unspecified laterality, unspecified part of lung  ? ? ? ?Note:  This document was prepared using Dragon voice recognition software and may include unintentional dictation errors. ? ?  ?Phineas Semen, MD ?07/25/21 1831 ? ?

## 2021-07-25 NOTE — Assessment & Plan Note (Addendum)
Follow-up with rheumatology as outpatient.  Feeling better after Solu-Medrol given here.  Will start with high-dose prednisone upon discharge 60 mg and taper down to 5 mg every week until down to 10 mg daily.  Follow-up with rheumatology as outpatient.  Also on Plaquenil and methotrexate as outpatient.  Continue folic acid. ? ? ?

## 2021-07-25 NOTE — ED Triage Notes (Signed)
Pt to ED POV with partner ?Pt has been feeling SOB for about 2 weeks, states is worse with standing and walking ?Pt bought pulse ox 2 days ago, partner states it has been reading low. Has been 89% when sitting, then drops to 80% when pt walks. States pulse also jumps to about 120 when pt walks. ? ?Partner gave pt her son's albuterol nebulizer to try, which he did one time and SPO2 went from 89% to 93% then when pt stood up, dropped again to 89%. ? ?Pt denies chest pain, speaking in full sentences. States he feels winded and tired and like cannot get full breath in. ? ?Currently 94% on RA. ?

## 2021-07-25 NOTE — ED Notes (Signed)
Blue top sent with other labs. 

## 2021-07-26 ENCOUNTER — Observation Stay: Payer: 59

## 2021-07-26 DIAGNOSIS — J9601 Acute respiratory failure with hypoxia: Secondary | ICD-10-CM

## 2021-07-26 DIAGNOSIS — M0579 Rheumatoid arthritis with rheumatoid factor of multiple sites without organ or systems involvement: Secondary | ICD-10-CM | POA: Diagnosis not present

## 2021-07-26 LAB — PROTIME-INR
INR: 1 (ref 0.8–1.2)
Prothrombin Time: 12.9 seconds (ref 11.4–15.2)

## 2021-07-26 LAB — LACTATE DEHYDROGENASE, PLEURAL OR PERITONEAL FLUID: LD, Fluid: 614 U/L — ABNORMAL HIGH (ref 3–23)

## 2021-07-26 LAB — BODY FLUID CELL COUNT WITH DIFFERENTIAL
Eos, Fluid: 0 %
Lymphs, Fluid: 52 %
Monocyte-Macrophage-Serous Fluid: 48 %
Neutrophil Count, Fluid: 0 %
Total Nucleated Cell Count, Fluid: 3926 cu mm

## 2021-07-26 LAB — HIV ANTIBODY (ROUTINE TESTING W REFLEX): HIV Screen 4th Generation wRfx: NONREACTIVE

## 2021-07-26 LAB — GLUCOSE, PLEURAL OR PERITONEAL FLUID: Glucose, Fluid: 160 mg/dL

## 2021-07-26 LAB — LACTATE DEHYDROGENASE: LDH: 169 U/L (ref 98–192)

## 2021-07-26 LAB — C-REACTIVE PROTEIN: CRP: 1.3 mg/dL — ABNORMAL HIGH (ref <1.0)

## 2021-07-26 LAB — GAMMA GT: GGT: 64 U/L — ABNORMAL HIGH (ref 7–50)

## 2021-07-26 LAB — TRIGLYCERIDES: Triglycerides: 187 mg/dL — ABNORMAL HIGH (ref <150)

## 2021-07-26 LAB — FIBRINOGEN: Fibrinogen: 483 mg/dL — ABNORMAL HIGH (ref 210–475)

## 2021-07-26 MED ORDER — HYDROXYCHLOROQUINE SULFATE 200 MG PO TABS
200.0000 mg | ORAL_TABLET | Freq: Once | ORAL | Status: AC
  Administered 2021-07-26: 200 mg via ORAL
  Filled 2021-07-26: qty 1

## 2021-07-26 MED ORDER — METHYLPREDNISOLONE SODIUM SUCC 125 MG IJ SOLR
80.0000 mg | Freq: Every day | INTRAMUSCULAR | Status: DC
  Administered 2021-07-26 – 2021-07-27 (×2): 80 mg via INTRAVENOUS
  Filled 2021-07-26 (×2): qty 2

## 2021-07-26 MED ORDER — SULFAMETHOXAZOLE-TRIMETHOPRIM 800-160 MG PO TABS
1.0000 | ORAL_TABLET | Freq: Two times a day (BID) | ORAL | Status: DC
  Administered 2021-07-26 – 2021-07-27 (×3): 1 via ORAL
  Filled 2021-07-26 (×3): qty 1

## 2021-07-26 MED ORDER — WITCH HAZEL-GLYCERIN EX PADS
MEDICATED_PAD | CUTANEOUS | Status: DC | PRN
  Filled 2021-07-26: qty 100

## 2021-07-26 NOTE — Progress Notes (Signed)
?PROGRESS NOTE ? ? ? ?Sean [name]  [name] DOB: [birth year] DOA: 07/25/2021 ?PCP: Sean Carls, MD  ? ?Brief Narrative:  ?Per Pulm/hospitalist: ?This is a 46 year old male with a history of vesicular rash, neutropenia recent diagnosis of rheumatoid arthritis came in with complaints of progressive shortness of breath x2 months also reports joint discomfort.  Reports somebody previously mentioned having Lyme disease or syphilis but he is unsure.  He apparently had ear nose and throat evaluation without significant findings.  He was also evaluated by rheumatologist Dr. Posey Pronto and internal medicine Dr. Ola Spurr.  During internal medicine evaluation November 2022 reported having subjective fevers which were intermittent intermittent for total duration of few weeks as well as loose stools and myalgias with rash bilateral hands associated with 15 pound weight loss.  At that time labs did show mild lymphopenia with a WBC count of 3.4 as well as mild transaminitis in the 100s.  He did have COVID and flu and RSV testing which was negative in the ER and previous evaluation.  When he seen provider in emergency room there was some concern for possible RMSF and he did have a course of doxycycline for that.  Further rheumatologic evaluation was performed and patient was diagnosed with rheumatoid arthritis and had initiated therapy for this.  He has been on Plaquenil and prednisone.  Further patient was seen by me in the office and also complained of shortness of breath was noted to have nodules in this was also thought to be secondary to rheumatoid.  At that time patient did not look acutely ill and was with stable vitals on room air with recommendation to continue treatment of underlying systemic inflammatory disease which is known to be associated with pulmonary nodules.  On this admission lab work shows mildly elevated CRP at 1.3 and moderately elevated ESR at 50 ferritin was markedly elevated over 1200 July 10, 2021.  Chlamydia and gonorrhea PCR was - November 2022.  D-dimer on this admission is also markedly elevated over 6.01. I reviewed his CTPE study with findigs of left pleural effusion and infiltrates bilaterally worse at bases.  ? ? ?Assessment & Plan: ?  ?Principal Problem: ?  SOB (shortness of breath) ?Active Problems: ?  Rheumatoid arthritis involving multiple sites with positive rheumatoid factor ([place]) ? ? ?-Acute hypoxemic respiratory failure ?This complex case was seen by pulmonology in consultation-following closely as patient is known to pulmonology ?As noted prior infectious work-ups been negative ?CT with concerns for infection versus rheumatological disease-pulmonology consulted ID ?-Continue Solu-Medrol 40 mg twice daily ?-Additional immunologic evaluation for CD4/CD8 ratio, CD19, IL-6, triglycerides, GGT, immunoglobulins. ? ?-Left pleural effusion with compressive atelectasis ?IR for thoracentesis today, pulmonology evaluating for lymphoma and leukemia panel as well as flow cytometry cytology Gram stain pH LDH and microbiology ? ?-Rheumatoid arthritis ?Admitting provider ordered C3, ANA, ANCA, ESR, CRP ?Continue on Plaquenil and methotrexate ?Consider rheumatology consult pending results of above work-up ? ? ?DVT prophylaxis: SCD/Compression stockings  ?Code Status: full code ? ?  ?Code Status Orders  ?(From admission, onward)  ?  ? ? ?  ? ?  Start     Ordered  ? 07/25/21 1918  Full code  Continuous       ? 07/25/21 1918  ? ?  ?  ? ?  ? ?Code Status History   ? ? This patient has a current code status but no historical code status.  ? ?  ? ?Family Communication: Family member at bedside ?Disposition Plan:  Patient not stable for discharge requiring IV steroids, further pulmonology work-up for new respiratory failure ?Consults called: None ?Admission status: Observation ? ? ?Consultants:  ?Pulmonology ? ?Procedures:  ?DG Chest 2 View ? ?Result Date: 07/25/2021 ?CLINICAL DATA:  Short of breath for 2  weeks, hypoxia EXAM: CHEST - 2 VIEW COMPARISON:  07/24/2021 FINDINGS: Frontal and lateral views of the chest demonstrate persistent bibasilar airspace disease and small bilateral effusions. No pneumothorax. Cardiac silhouette is unremarkable. No acute bony abnormalities. IMPRESSION: 1. Bibasilar airspace disease consistent with pneumonia. 2. Small bilateral pleural effusions. Electronically Signed   By: Randa Ngo M.D.   On: 07/25/2021 16:38  ? ?CT CHEST W CONTRAST ? ?Result Date: 07/25/2021 ?CLINICAL DATA:  Worsening shortness of breath, fatigue, previous tobacco abuse, rheumatoid arthritis, recent diagnosis of pneumonia EXAM: CT CHEST WITH CONTRAST TECHNIQUE: Multidetector CT imaging of the chest was performed during intravenous contrast administration. RADIATION DOSE REDUCTION: This exam was performed according to the departmental dose-optimization program which includes automated exposure control, adjustment of the mA and/or kV according to patient size and/or use of iterative reconstruction technique. CONTRAST:  70m OMNIPAQUE IOHEXOL 300 MG/ML  SOLN COMPARISON:  07/09/2021 FINDINGS: Cardiovascular: The heart is unremarkable without pericardial effusion. No evidence of thoracic aortic aneurysm or dissection. Mediastinum/Nodes: No pathologic adenopathy within the mediastinum, hila, or axillary regions. Numerous subcentimeter lymph nodes are nonspecific. Thyroid, trachea, and esophagus are unremarkable. Lungs/Pleura: There is dense multifocal airspace disease at the lung bases, most pronounced within the lingula and bilateral lower lobes. Within the lower lobes, there are small cystic areas which could reflect central cavitation. Small left pleural effusion. No pneumothorax. Mild emphysema at the lung apices. Central airways are patent. Upper Abdomen: No acute abnormality. Musculoskeletal: There are no acute or destructive bony lesions. Reconstructed images demonstrate no additional findings. IMPRESSION: 1.  Multifocal bibasilar airspace disease consistent with bronchopneumonia. Cystic areas within the lower lobes compatible with cavitation. Given the patient's known diagnosis of rheumatoid arthritis and immunosuppressive therapy, atypical infection should be considered. The appearance is not typical for cavitating rheumatoid nodules. 2. Small left pleural effusion. 3. Subcentimeter mediastinal, hilar, and axillary lymph nodes, nonspecific. No pathologic adenopathy. These may be reactive. These results were called by telephone at the time of interpretation on 07/25/2021 at 6:01 pm to ER provider DR GArchie Balboa who verbally acknowledged these results. Electronically Signed   By: MRanda NgoM.D.   On: 07/25/2021 18:04  ? ?CT Angio Chest Pulmonary Embolism (PE) W or WO Contrast ? ?Result Date: 07/25/2021 ?CLINICAL DATA:  Short of breath for 2 weeks, dyspnea on exertion, positive D dimer EXAM: CT ANGIOGRAPHY CHEST WITH CONTRAST TECHNIQUE: Multidetector CT imaging of the chest was performed using the standard protocol during bolus administration of intravenous contrast. Multiplanar CT image reconstructions and MIPs were obtained to evaluate the vascular anatomy. RADIATION DOSE REDUCTION: This exam was performed according to the departmental dose-optimization program which includes automated exposure control, adjustment of the mA and/or kV according to patient size and/or use of iterative reconstruction technique. CONTRAST:  72mOMNIPAQUE IOHEXOL 350 MG/ML SOLN COMPARISON:  07/24/2021 FINDINGS: Cardiovascular: This is a technically adequate evaluation of the pulmonary vasculature. No filling defects or pulmonary emboli. The heart is unremarkable without pericardial effusion. No evidence of thoracic aortic aneurysm or dissection. Mediastinum/Nodes: Stable borderline enlarged lymph nodes within the mediastinum, likely reactive. No hilar or axillary adenopathy. Thyroid, trachea, and esophagus are unremarkable. Lungs/Pleura: No  significant change in the bilateral lower lobe consolidation seen previously. Small cystic  areas within the lower lobe consolidation may reflect early cavitation. Small left pleural effusion. Trace right pleu

## 2021-07-26 NOTE — Consult Note (Addendum)
? ? ? ?PULMONOLOGY ? ? ? ? ? ? ? ? ?Date: 07/26/2021,   ?MRN# [id number] Sean [name] [birth year] ? ? ?  ?AdmissionWeight: 80.7 kg                 ?CurrentWeight: 79.3 kg ? ?Referring provider: Dr. Posey [name] ? ? ?CHIEF COMPLAINT:  ? ?Abnormal chest imaging with pulmonary nodule worsening shortness of breath x2 months ? ? ?HISTORY OF PRESENT ILLNESS  ? ?This is a 46 year old male with a history of vesicular rash, neutropenia recent diagnosis of rheumatoid arthritis came in with complaints of progressive shortness of breath x2 months also reports joint discomfort.  Reports somebody previously mentioned having Lyme disease or syphilis but he is unsure.  He apparently had ear nose and throat evaluation without significant findings.  He was also evaluated by rheumatologist Dr. Posey [name] and internal medicine Dr. Ola [name].  During internal medicine evaluation November 2022 reported having subjective fevers which were intermittent intermittent for total duration of few weeks as well as loose stools and myalgias with rash bilateral hands associated with 15 pound weight loss.  At that time labs did show mild lymphopenia with a WBC count of 3.4 as well as mild transaminitis in the 100s.  He did have COVID and flu and RSV testing which was negative in the ER and previous evaluation.  When he seen provider in emergency room there was some concern for possible RMSF and he did have a course of doxycycline for that.  Further rheumatologic evaluation was performed and patient was diagnosed with rheumatoid arthritis and had initiated therapy for this.  He has been on Plaquenil and prednisone.  Further patient was seen by me in the office and also complained of shortness of breath was noted to have nodules in this was also thought to be secondary to rheumatoid.  At that time patient did not look acutely ill and was with stable vitals on room air with recommendation to continue treatment of underlying systemic inflammatory disease which  is known to be associated with pulmonary nodules.  On this admission lab work shows mildly elevated CRP at 1.3 and moderately elevated ESR at 50 ferritin was markedly elevated over 1200 July 10, 2021.  Chlamydia and gonorrhea PCR was - November 2022.  D-dimer on this admission is also markedly elevated over 6.01. I reviewed his CTPE study with findigs of left pleural effusion and infiltrates bilaterally worse at bases.  ? ? ?PAST MEDICAL HISTORY  ? ?Past Medical History:  ?Diagnosis Date  ? H/O immunosuppressive therapy   ? Hyperpigmentation of skin   ? Hyperpigmentation rash/vesicles of the palm and face  ? Neutropenia ([place])   ? RA (rheumatoid arthritis) ([name])   ? ? ? ?SURGICAL HISTORY  ? ?History reviewed. No pertinent surgical history. ? ? ?FAMILY HISTORY  ? ?Family History  ?Problem Relation Age of Onset  ? Hyperlipidemia Mother   ? Hypertension Father   ? ? ? ?SOCIAL HISTORY  ? ?Social History  ? ?Tobacco Use  ? Smoking status: Former  ?  Types: Cigarettes  ? Smokeless tobacco: Never  ?Substance Use Topics  ? Alcohol use: Not Currently  ? Drug use: Not Currently  ?  Types: Marijuana  ? ? ? ?MEDICATIONS  ? ? ?Home Medication:  ?  ?Current Medication: ? ?Current Facility-Administered Medications:  ?  albuterol (PROVENTIL) (2.5 MG/3ML) 0.083% nebulizer solution 3 mL, 3 mL, Inhalation, Q6H PRN, Para Skeans, MD ?  azithromycin (ZITHROMAX) 500 mg in sodium  chloride 0.9 % 250 mL IVPB, 500 mg, Intravenous, Q24H, Patel, Ekta V, MD ?  cefTRIAXone (ROCEPHIN) 1 g in sodium chloride 0.9 % 100 mL IVPB, 1 g, Intravenous, Q24H, Sean [name], Ekta V, MD ?  heparin injection 5,000 Units, 5,000 Units, Subcutaneous, Q8H, Para Skeans, MD, 5,000 Units at 07/26/21 9211 ? ? ? ?ALLERGIES  ? ?Patient has no known allergies. ? ? ? ? ?REVIEW OF SYSTEMS  ? ? ?Review of Systems: ? ?Gen:  Denies  fever, sweats, chills weigh loss  ?HEENT: Denies blurred vision, double vision, ear pain, eye pain, hearing loss, nose bleeds, sore throat ?Cardiac:   No dizziness, chest pain or heaviness, chest tightness,edema ?Resp:   reports dyspnea chronically  ?Gi: Denies swallowing difficulty, stomach pain, nausea or vomiting, diarrhea, constipation, bowel incontinence ?Gu:  Denies bladder incontinence, burning urine ?Ext:   Denies Joint pain, stiffness or swelling ?Skin: Denies  skin rash, easy bruising or bleeding or hives ?Endoc:  Denies polyuria, polydipsia , polyphagia or weight change ?Psych:   Denies depression, insomnia or hallucinations  ? ?Other:  All other systems negative ? ? ?VS: BP (!) 102/59 (BP Location: Left Arm)   Pulse 78   Temp 98.6 ?F (37 ?C)   Resp 16   Ht '6\' 5"'  (1.956 m)   Wt 79.3 kg   SpO2 97%   BMI 20.73 kg/m?   ? ? ? ?PHYSICAL EXAM  ? ? ?GENERAL:NAD, no fevers, chills, no weakness no fatigue ?HEAD: Normocephalic, atraumatic.  ?EYES: Pupils equal, round, reactive to light. Extraocular muscles intact. No scleral icterus.  ?MOUTH: Moist mucosal membrane. Dentition intact. No abscess noted.  ?EAR, NOSE, THROAT: Clear without exudates. No external lesions.  ?NECK: Supple. No thyromegaly. No nodules. No JVD.  ?PULMONARY: decreased breath sounds with mild rhonchi worse at bases bilaterally.  ?CARDIOVASCULAR: S1 and S2. Regular rate and rhythm. No murmurs, rubs, or gallops. No edema. Pedal pulses 2+ bilaterally.  ?GASTROINTESTINAL: Soft, nontender, nondistended. No masses. Positive bowel sounds. No hepatosplenomegaly.  ?MUSCULOSKELETAL: No swelling, clubbing, or edema. Range of motion full in all extremities.  ?NEUROLOGIC: Cranial nerves II through XII are intact. No gross focal neurological deficits. Sensation intact. Reflexes intact.  ?SKIN: No ulceration, lesions, rashes, or cyanosis. Skin warm and dry. Turgor intact.  ?PSYCHIATRIC: Mood, affect within normal limits. The patient is awake, alert and oriented x 3. Insight, judgment intact.  ? ? ?  ? ?IMAGING  ? ? ? ?ASSESSMENT/PLAN  ? ? ?Acute hypoxemic respiratory failure   ?    -Family reporting  consistent SPO2 low 80s while at rest at home, has been normoxic during my evaluation but does have bibasilar crackles and abnormal CT chest with findings infiltrates bilaterally and left-sided effusion ?- patient is not toxic appearing but is also relatively immunocompromised due to underlying rheumatoid and is currently on steroids outpatient prednisone and Plaquenil. ?-We will place on Bactrim twice daily at least for PCP prophylaxis and concomitantly treating possible infectious pneumonia.  He is nontachypneic nonlabored breathing without chest pain and does not show clinical signs of pneumonia.  We will DC Rocephin and Zithromax for now. ?-Additionally procalcitonin is negative less than 0.1 and absence of leukocytosis. ?-Previous infectious work-up has been negative. 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  Previous testing was negative for HIV/chlamydia gonorrhea.  Additional immunologic evaluation for CD4/CD8 ratio CD19 and 20 IL-6, triglycerides and GGT , immunoglobulins, pathology consultation for evaluation of hemophagocytosis ?-We will start empiric Solu-Medrol 40 mg twice daily ?-Infectious disease routine consultation for additional evaluation due to yet unclear etiology of recurrent febrile illness with progressive clinical worsening ?-Rheumatology evaluation routine consult ? ?Left pleural effusion with compressive atelectasis ?   -Have ordered diagnostic  thoracentesis with lymphoma and leukemia panel as well as flow cytometry, cytology, Gram stain, pH, LDH, microbiology ?-Patient did have incentive spirometry and use several times each hour. ? ?  ? ? ? ? ?Thank yo

## 2021-07-26 NOTE — Procedures (Signed)
PROCEDURE SUMMARY: ? ?Successful US guided diagnostic and therapeutic left thoracentesis. ?Yielded 500 mL of cloudy, yellow fluid. ?Pt tolerated procedure well. ?No immediate complications. ? ?Specimen was sent for labs. ?CXR ordered. ? ?EBL < 1 mL ? ?Shon Hough, AGNP ?07/26/2021 ?10:52 AM ? ?  ? ?

## 2021-07-27 DIAGNOSIS — R918 Other nonspecific abnormal finding of lung field: Secondary | ICD-10-CM | POA: Diagnosis not present

## 2021-07-27 DIAGNOSIS — J90 Pleural effusion, not elsewhere classified: Secondary | ICD-10-CM | POA: Diagnosis not present

## 2021-07-27 DIAGNOSIS — R7989 Other specified abnormal findings of blood chemistry: Secondary | ICD-10-CM

## 2021-07-27 DIAGNOSIS — R0602 Shortness of breath: Secondary | ICD-10-CM | POA: Diagnosis not present

## 2021-07-27 DIAGNOSIS — M0579 Rheumatoid arthritis with rheumatoid factor of multiple sites without organ or systems involvement: Secondary | ICD-10-CM | POA: Diagnosis not present

## 2021-07-27 LAB — IGG 1, 2, 3, AND 4
IgG (Immunoglobin G), Serum: 1469 mg/dL (ref 603–1613)
IgG, Subclass 1: 1133 mg/dL — ABNORMAL HIGH (ref 248–810)
IgG, Subclass 2: 273 mg/dL (ref 130–555)
IgG, Subclass 3: 59 mg/dL (ref 15–102)
IgG, Subclass 4: 46 mg/dL (ref 2–96)

## 2021-07-27 LAB — CMV IGM: CMV IgM: <30 AU/mL (ref 0.0–29.9)

## 2021-07-27 LAB — MEASLES/MUMPS/RUBELLA IMMUNITY
Mumps IgG: 91 AU/mL (ref >10.9)
Rubella: 7.56 index (ref >0.99)
Rubeola IgG: 169 AU/mL (ref >16.4)

## 2021-07-27 LAB — EBV AB TO VIRAL CAPSID AG PNL, IGG+IGM
EBV VCA IgG: 516 U/mL — ABNORMAL HIGH (ref 0.0–17.9)
EBV VCA IgM: <36 U/mL (ref 0.0–35.9)

## 2021-07-27 LAB — C3 COMPLEMENT: C3 Complement: 95 mg/dL (ref 82–167)

## 2021-07-27 LAB — RHEUMATOID FACTOR: Rheumatoid fact SerPl-aCnc: 182.3 IU/mL — ABNORMAL HIGH (ref <14.0)

## 2021-07-27 LAB — ANA: Anti Nuclear Antibody (ANA): NEGATIVE

## 2021-07-27 LAB — INTERLEUKIN-6, SERUM  IL6SL: Interleukin-6, Serum: 45 pg/mL — ABNORMAL HIGH (ref 0.0–13.0)

## 2021-07-27 MED ORDER — PREDNISONE 10 MG PO TABS
ORAL_TABLET | ORAL | 0 refills | Status: DC
Start: 2021-07-27 — End: 2021-09-13

## 2021-07-27 MED ORDER — WITCH HAZEL-GLYCERIN EX PADS: MEDICATED_PAD | CUTANEOUS | 0 refills | Status: DC | PRN

## 2021-07-27 MED ORDER — METHYLPREDNISOLONE SODIUM SUCC 125 MG IJ SOLR: 60.0000 mg | Freq: Every day | INTRAMUSCULAR | Status: DC

## 2021-07-27 MED ORDER — SULFAMETHOXAZOLE-TRIMETHOPRIM 400-80 MG PO TABS: 1.0000 | ORAL_TABLET | Freq: Two times a day (BID) | ORAL | 0 refills | Status: DC

## 2021-07-27 MED ORDER — POLYETHYLENE GLYCOL 3350 17 G PO PACK
17.0000 g | PACK | Freq: Every day | ORAL | Status: DC
  Administered 2021-07-27: 17 g via ORAL
  Filled 2021-07-27: qty 1

## 2021-07-27 NOTE — Progress Notes (Signed)
? ? ? ?PULMONOLOGY ? ? ? ? ? ? ? ? ?Date: 07/27/2021,   ?MRN# [id number] Sean [name] 1975/07/18 ? ? ?  ?AdmissionWeight: 80.7 kg                 ?CurrentWeight: 79.3 kg ? ?Referring provider: Dr. Posey Pronto ? ? ?CHIEF COMPLAINT:  ? ?Abnormal chest imaging with pulmonary nodule worsening shortness of breath x2 months ? ? ?HISTORY OF PRESENT ILLNESS  ? ?This is a 46 year old male with a history of vesicular rash, neutropenia recent diagnosis of rheumatoid arthritis came in with complaints of progressive shortness of breath x2 months also reports joint discomfort.  Reports somebody previously mentioned having Lyme disease or syphilis but he is unsure.  He apparently had ear nose and throat evaluation without significant findings.  He was also evaluated by rheumatologist Dr. Posey Pronto and internal medicine Dr. Ola Spurr.  During internal medicine evaluation November 2022 reported having subjective fevers which were intermittent intermittent for total duration of few weeks as well as loose stools and myalgias with rash bilateral hands associated with 15 pound weight loss.  At that time labs did show mild lymphopenia with a WBC count of 3.4 as well as mild transaminitis in the 100s.  He did have COVID and flu and RSV testing which was negative in the ER and previous evaluation.  When he seen provider in emergency room there was some concern for possible RMSF and he did have a course of doxycycline for that.  Further rheumatologic evaluation was performed and patient was diagnosed with rheumatoid arthritis and had initiated therapy for this.  He has been on Plaquenil and prednisone.  Further patient was seen by me in the office and also complained of shortness of breath was noted to have nodules in this was also thought to be secondary to rheumatoid.  At that time patient did not look acutely ill and was with stable vitals on room air with recommendation to continue treatment of underlying systemic inflammatory disease which  is known to be associated with pulmonary nodules.  On this admission lab work shows mildly elevated CRP at 1.3 and moderately elevated ESR at 50 ferritin was markedly elevated over 1200 July 10, 2021.  Chlamydia and gonorrhea PCR was - November 2022.  D-dimer on this admission is also markedly elevated over 6.01. I reviewed his CTPE study with findigs of left pleural effusion and infiltrates bilaterally worse at bases.  ? ?07/27/21- patient reports feeling better after initiation of steroids at solumedrol. I have reduced solumedrol to 60 daily IV.  Would dc on pred 68m with reduction by 538mweekly. He had thoracentesis and reports improvement. Most of the testing is still not back but he is stable on room air. Dr RaRamon Dredgeas seen him today and we discussed findings together with patient and family. He is cleared from pulmonary perspective and may follow up with [facility]. He should have single strength bactrim once daily for PCP prophylaxis ? ?PAST MEDICAL HISTORY  ? ?Past Medical History:  ?Diagnosis Date  ? H/O immunosuppressive therapy   ? Hyperpigmentation of skin   ? Hyperpigmentation rash/vesicles of the palm and face  ? [place]  ? RA (rheumatoid arthritis) ([facility]  ? ? ? ?SURGICAL HISTORY  ? ?History reviewed. No pertinent surgical history. ? ? ?FAMILY HISTORY  ? ?Family History  ?Problem Relation Age of Onset  ? Hyperlipidemia Mother   ? Hypertension Father   ? ? ? ?SOCIAL HISTORY  ? ?  Social History  ? ?Tobacco Use  ? Smoking status: Former  ?  Types: Cigarettes  ? Smokeless tobacco: Never  ?Substance Use Topics  ? Alcohol use: Not Currently  ? Drug use: Not Currently  ?  Types: Marijuana  ? ? ? ?MEDICATIONS  ? ? ?Home Medication:  ?  ?Current Medication: ? ?Current Facility-Administered Medications:  ?  albuterol (PROVENTIL) (2.5 MG/3ML) 0.083% nebulizer solution 3 mL, 3 mL, Inhalation, Q6H PRN, Para Skeans, MD ?  heparin injection 5,000 Units, 5,000 Units, Subcutaneous, Q8H, Para Skeans, MD, 5,000 Units at 07/27/21 [phone] ?  methylPREDNISolone sodium succinate (SOLU-MEDROL) 125 mg/2 mL injection 80 mg, 80 mg, Intravenous, Daily, Ottie Glazier, MD, 80 mg at 07/27/21 5643 ?  polyethylene glycol (MIRALAX / GLYCOLAX) packet 17 g, 17 g, Oral, Daily, Wieting, Richard, MD, 17 g at 07/27/21 0900 ?  sulfamethoxazole-trimethoprim (BACTRIM DS) 800-160 MG per tablet 1 tablet, 1 tablet, Oral, Q12H, Ottie Glazier, MD, 1 tablet at 07/27/21 3295 ?  witch hazel-glycerin (TUCKS) pad, , Topical, PRN, Spongberg, Audie Pinto, MD, Given at 07/26/21 2142 ? ? ? ?ALLERGIES  ? ?Patient has no known allergies. ? ? ? ? ?REVIEW OF SYSTEMS  ? ? ?Review of Systems: ? ?Gen:  Denies  fever, sweats, chills weigh loss  ?HEENT: Denies blurred vision, double vision, ear pain, eye pain, hearing loss, nose bleeds, sore throat ?Cardiac:  No dizziness, chest pain or heaviness, chest tightness,edema ?Resp:   reports dyspnea chronically  ?Gi: Denies swallowing difficulty, stomach pain, nausea or vomiting, diarrhea, constipation, bowel incontinence ?Gu:  Denies bladder incontinence, burning urine ?Ext:   Denies Joint pain, stiffness or swelling ?Skin: Denies  skin rash, easy bruising or bleeding or hives ?Endoc:  Denies polyuria, polydipsia , polyphagia or weight change ?Psych:   Denies depression, insomnia or hallucinations  ? ?Other:  All other systems negative ? ? ?VS: BP 105/75 (BP Location: Left Arm)   Pulse 88   Temp 98.1 ?F (36.7 ?C) (Oral)   Resp 18   Ht '6\' 5"'  (1.956 m)   Wt 79.3 kg   SpO2 96%   BMI 20.73 kg/m?   ? ? ? ?PHYSICAL EXAM  ? ? ?GENERAL:NAD, no fevers, chills, no weakness no fatigue ?HEAD: Normocephalic, atraumatic.  ?EYES: Pupils equal, round, reactive to light. Extraocular muscles intact. No scleral icterus.  ?MOUTH: Moist mucosal membrane. Dentition intact. No abscess noted.  ?EAR, NOSE, THROAT: Clear without exudates. No external lesions.  ?NECK: Supple. No thyromegaly. No nodules. No JVD.   ?PULMONARY: decreased breath sounds with mild rhonchi worse at bases bilaterally.  ?CARDIOVASCULAR: S1 and S2. Regular rate and rhythm. No murmurs, rubs, or gallops. No edema. Pedal pulses 2+ bilaterally.  ?GASTROINTESTINAL: Soft, nontender, nondistended. No masses. Positive bowel sounds. No hepatosplenomegaly.  ?MUSCULOSKELETAL: No swelling, clubbing, or edema. Range of motion full in all extremities.  ?NEUROLOGIC: Cranial nerves II through XII are intact. No gross focal neurological deficits. Sensation intact. Reflexes intact.  ?SKIN: No ulceration, lesions, rashes, or cyanosis. Skin warm and dry. Turgor intact.  ?PSYCHIATRIC: Mood, affect within normal limits. The patient is awake, alert and oriented x 3. Insight, judgment intact.  ? ? ?  ? ?IMAGING  ? ? ? ?ASSESSMENT/PLAN  ? ? ?Acute hypoxemic respiratory failure   ?    -Family reporting consistent SPO2 low 80s while at rest at home, has been normoxic during my evaluation but does have bibasilar crackles and abnormal CT chest with findings infiltrates bilaterally and left-sided effusion ?-  patient is not toxic appearing but is also relatively immunocompromised due to underlying rheumatoid and is currently on steroids outpatient prednisone and Plaquenil. ?-We will place on Bactrim twice daily at least for PCP prophylaxis and concomitantly treating possible infectious pneumonia.  He is nontachypneic nonlabored breathing without chest pain and does not show clinical signs of pneumonia.  We will DC Rocephin and Zithromax for now. ?-Additionally procalcitonin is negative less than 0.1 and absence of leukocytosis. ?-Previous infectious work-up has been negative. 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

## 2021-07-27 NOTE — Progress Notes (Signed)
Patient being discharged home. IV removed. Went over discharge instructions and medications with patient and patients wife. Both agreed they understood and all questions were answered. Patients home medications that were stored in pharmacy was given back to patient. Patient going home POV with wife.  ?

## 2021-07-27 NOTE — Assessment & Plan Note (Signed)
Follow up as outpatient.  

## 2021-07-27 NOTE — Consult Note (Addendum)
NAME: Sean [name]  ?DOB: February 14, 1976  ?MRN: [id number]  ?Date/Time: 07/27/2021 11:10 AM ? ?REQUESTING PROVIDER: Dr. Lanney Gins ?Subjective:  ?REASON FOR CONSULT: Bilateral pulmonary infiltrate ?History obtained from patient, his partner and chart reviewed thoroughly. ?Dr.Aleskerov in the room ? ?Sean [name] is a 46 y.o. male admitted with increasing shortness of breath. ?Patient was doing well at his baseline until 7 months ago. ?He first went to [facility] ED with a 3-week history of intermittent fever, headache, diarrhea, myalgia, chest pain and rash to his hands bilaterally and weight loss of 15 pounds on 02/09/2021.  He had a temperature of 101.5 during that ED visit.  COVID was negative so was flu and RSV.  Chest x-ray done did not show any infiltrate but blunting of the right costophrenic angle suggestive of pleural effusion.  During that visit he was given doxycycline for possible [facility] spotted fever. ?He then saw Dr. Ola Spurr internal medicine/infectious disease on 02/23/2021.  His main complaint was the rash and swelling of his hands and rash around his face and eyes when he came to see Dr. Ola Spurr.  He had a temperature of 101.  Dr. Caryn Section had a broad differential including syphilis, acute HIV dermatomyositis, rheumatoid arthritis, polymyositis, lupus.  He did not think tickborne illness was likely.  He sent labs for the above test.  Syphilis and HIV were negative.  So was the autoimmune work-up except for rheumatoid arthritis which was increased at 32 ( normal < 14). ?He was referred to rheumatologist and saw Dr. Posey Pronto on 03/26/2021.  He diagnosed him with rheumatoid arthritis stage II.  He was concerned about Felty syndrome because of low WBC.  The patient was already on prednisone 60 mg/day and started him to taper it to 20 mg/day.  He also put him on Plaquenil 200 mg twice daily.  Patient was also referred to oncology for the leukopenia.  Dr. Janese Banks plan to observe him with repeat  CBC.  Patient states when the prednisone was reduced to 5 mg he was having breath.  Increased prednisone himself.  On 07/15/2021 he saw pulmonologist at [facility] clinic for increasing shortness of breath.  He ordered a CT chest.  This was done on 07/24/2021 and it showed multifocal bibasilar airspace disease consistent with bronchopneumonia.  There was cystic areas within the lower lobes compatible with cavitation.  Subcentimeter mediastinal, hilar and axillary lymph nodes were present which were nonspecific. ?Patient presented to the ED on 07/25/2021 as his pulse was low 80s. ?He had a CT a and that ruled out PE.  The bilateral multifocal infiltrates were seen as well as cavitation in the lower lobes. ?He was been Solu-Medrol 60 mg by pulmonologist and he felt much better after that ?I am asked to see him just to rule out any infectious process. ?Patient does not give any history of travel ?He works in a warehouse ?He has a dog ?His partner has a bearded dragon ?He does not do any landscaping ?No swimming in body of water outside.  Only in the pool ?Smokes marijuana.  No vaping ?Uses a lot of bodybuilding products including SARMS ?Multiple tattoos ? ?Past Medical History:  ?Diagnosis Date  ? H/O immunosuppressive therapy   ? Hyperpigmentation of skin   ? Hyperpigmentation rash/vesicles of the palm and face  ? Neutropenia ([place])   ? RA (rheumatoid arthritis) ([place])   ?  ?History reviewed. No pertinent surgical history.  ?Social History  ? ?Socioeconomic History  ? Marital status: Single  ?  Spouse name: Not on file  ? Number of children: Not on file  ? Years of education: Not on file  ? Highest education level: Not on file  ?Occupational History  ? Not on file  ?Tobacco Use  ? Smoking status: Former  ?  Types: Cigarettes  ? Smokeless tobacco: Never  ?Substance and Sexual Activity  ? Alcohol use: Not Currently  ? Drug use: Not Currently  ?  Types: Marijuana  ? Sexual activity: Not on file  ?Other Topics Concern  ? Not on  file  ?Social History Narrative  ? Not on file  ? ?Social Determinants of Health  ? ?Financial Resource Strain: Not on file  ?Food Insecurity: Not on file  ?Transportation Needs: Not on file  ?Physical Activity: Not on file  ?Stress: Not on file  ?Social Connections: Not on file  ?Intimate Partner Violence: Not on file  ?  ?Family History  ?Problem Relation Age of Onset  ? Hyperlipidemia Mother   ? Hypertension Father   ? ?No Known Allergies ?I? ?Current Facility-Administered Medications  ?Medication Dose Route Frequency Provider Last Rate Last Admin  ? albuterol (PROVENTIL) (2.5 MG/3ML) 0.083% nebulizer solution 3 mL  3 mL Inhalation Q6H PRN Para Skeans, MD      ? heparin injection 5,000 Units  5,000 Units Subcutaneous Q8H Para Skeans, MD   5,000 Units at 07/27/21 [phone]  ? methylPREDNISolone sodium succinate (SOLU-MEDROL) 125 mg/2 mL injection 80 mg  80 mg Intravenous Daily Ottie Glazier, MD   80 mg at 07/27/21 [id number]  ? polyethylene glycol (MIRALAX / GLYCOLAX) packet 17 g  17 g Oral Daily Wieting, Richard, MD      ? sulfamethoxazole-trimethoprim (BACTRIM DS) 800-160 MG per tablet 1 tablet  1 tablet Oral Q12H Ottie Glazier, MD   1 tablet at 07/27/21 [id number]  ? witch hazel-glycerin (TUCKS) pad   Topical PRN Marcell Anger, MD   Given at 07/26/21 2142  ?  ? ?Abtx:  ?Anti-infectives (From admission, onward)  ? ? Start     Dose/Rate Route Frequency Ordered Stop  ? 07/26/21 2100  hydroxychloroquine (PLAQUENIL) tablet 200 mg       ? 200 mg Oral  Once 07/26/21 2013 07/26/21 2143  ? 07/26/21 1800  azithromycin (ZITHROMAX) 500 mg in sodium chloride 0.9 % 250 mL IVPB  Status:  Discontinued       ? 500 mg ?250 mL/hr over 60 Minutes Intravenous Every 24 hours 07/25/21 1922 07/26/21 0959  ? 07/26/21 1800  cefTRIAXone (ROCEPHIN) 1 g in sodium chloride 0.9 % 100 mL IVPB  Status:  Discontinued       ? 1 g ?200 mL/hr over 30 Minutes Intravenous Every 24 hours 07/25/21 1922 07/26/21 0959  ? 07/26/21 1045   sulfamethoxazole-trimethoprim (BACTRIM DS) 800-160 MG per tablet 1 tablet       ? 1 tablet Oral Every 12 hours 07/26/21 0959    ? 07/25/21 1800  cefTRIAXone (ROCEPHIN) 1 g in sodium chloride 0.9 % 100 mL IVPB       ? 1 g ?200 mL/hr over 30 Minutes Intravenous  Once 07/25/21 1752 07/25/21 1832  ? 07/25/21 1800  azithromycin (ZITHROMAX) 500 mg in sodium chloride 0.9 % 250 mL IVPB       ? 500 mg ?250 mL/hr over 60 Minutes Intravenous  Once 07/25/21 1752 07/25/21 2040  ? ?  ? ? ?REVIEW OF SYSTEMS:  ?Cons Intermittent fever, negative chills, ++weight loss ?Eyes: negative diplopia or visual  changes, negative eye pain ?ENT: negative coryza, + sore throat ?Resp: + cough, clear sputum no hemoptysis, ++dyspnea ?Cards: negative for chest pain, palpitations, lower extremity edema ?GU: negative for frequency, dysuria and hematuria ?GI: Negative for abdominal pain, diarrhea, bleeding, constipation ?Skin: ++ rash ?Heme: negative for easy bruising and gum/nose bleeding ?MS: stiffness and pain hands, fingers, elbows ?Neurolo:negative for headaches, dizziness, vertigo, memory problems  ?Psych: negative for feelings of anxiety, depression  ?Endocrine: negative for thyroid, diabetes ?Allergy/Immunology- negative for any medication or food allergies ?? ? ?Objective:  ?VITALS:  ?BP 105/75 (BP Location: Left Arm)   Pulse 88   Temp 98.1 ?F (36.7 ?C) (Oral)   Resp 18   Ht 6\' 5"  (1.956 m)   Wt 79.3 kg   SpO2 96%   BMI 20.73 kg/m?  ?PHYSICAL EXAM:  ?General: Alert, cooperative, no distress, appears stated age.  ?Head: Normocephalic, without obvious abnormality, atraumatic. ?Eyes: Conjunctivae clear, anicteric sclerae. Pupils are equal ?ENT Nares normal. No drainage or sinus tenderness. ?Lips, mucosa, and tongue normal. No Thrush ?Neck: Supple, symmetrical, no adenopathy, thyroid: non tender ?no carotid bruit and no JVD. ?Back: No CVA tenderness. ?Lungs: Clear to auscultation bilaterally. No Wheezing or Rhonchi. No rales. ?Heart:  Regular rate and rhythm, no murmur, rub or gallop. ?Abdomen: Soft, non-tender,not distended. Bowel sounds normal. No masses ?Extremities: atraumatic, no cyanosis. No edema. No clubbing ?Skin: No rashes or lesions. Or bruising ?Lymph

## 2021-07-27 NOTE — Discharge Summary (Signed)
?Physician Discharge Summary ?  ?Patient: Sean [name] MRN: [id number] DOB: 03-13-1976  ?Admit date:     07/25/2021  ?Discharge date: 07/27/21  ?Discharge Physician: Alford Highland  ? ?PCP: [name], MD  ? ?Recommendations at discharge:  ? ?Follow-up PCP 7 days ?Follow-up with pulmonary Dr. Karna Christmas on Thursday. ?Follow-up with rheumatology ? ?Discharge Diagnoses: ?Principal Problem: ?  Pleural effusion ?Active Problems: ?  Rheumatoid arthritis involving multiple sites with positive rheumatoid factor (HCC) ?  Elevated LFTs ? ? ?Hospital Course: ?The patient was admitted to the hospital on 07/25/2021 and discharged on 07/27/2021.  The patient has a history of rheumatoid arthritis who presented with shortness of breath.  Interventional radiology performed a thoracentesis which removed 500 mL of fluid underneath the left lung.  The patient was seen by pulmonary and infectious disease.  They believe that the pleural fluid is likely secondary to rheumatoid arthritis rather than infection.  Pulmonary recommended single strength Bactrim on a daily basis for prophylaxis.  Pulmonary also recommended high-dose prednisone 60 mg daily upon discharge.  The patient was feeling better and off oxygen at this point. ? ?Assessment and Plan: ?* Pleural effusion ?Likely secondary to rheumatoid arthritis.  500 mL of fluid drawn off from underneath the left lung.  Follow-up with pulmonary as outpatient.  High-dose prednisone 60 mg daily for 1 week and taper down by 5 mg every week down to his usual 10 mg. ? ?Rheumatoid arthritis involving multiple sites with positive rheumatoid factor (HCC) ?Follow-up with rheumatology as outpatient.  Feeling better after Solu-Medrol given here.  Will start with high-dose prednisone upon discharge 60 mg and taper down to 5 mg every week until down to 10 mg daily.  Follow-up with rheumatology as outpatient.  Also on Plaquenil and methotrexate as outpatient.  Continue folic acid. ? ? ? ?Elevated  LFTs ?Follow-up as outpatient ? ? ? ? ?  ? ? ?Consultants: Pulmonary, infectious disease ?Procedures performed: Left thoracentesis by interventional radiology ?Disposition: Home ?Diet recommendation:  ?Regular diet ?DISCHARGE MEDICATION: ?Allergies as of 07/27/2021   ?No Known Allergies ?  ? ?  ?Medication List  ?  ? ?TAKE these medications   ? ?folic acid 1 MG tablet ?Commonly known as: FOLVITE ?Take 1 mg by mouth daily. ?  ?hydroxychloroquine 200 MG tablet ?Commonly known as: PLAQUENIL ?Take 1 tablet by mouth 2 (two) times daily. ?  ?methotrexate 2.5 MG tablet ?Take 5 tablets by mouth every 7 (seven) days. ?  ?predniSONE 10 MG tablet ?Commonly known as: DELTASONE ?6 tabs (60 mg)po daily for one week; then taper  every week until down to 10 mg daily ?  ?sulfamethoxazole-trimethoprim 400-80 MG tablet ?Commonly known as: BACTRIM ?Take 1 tablet by mouth 2 (two) times daily. ?  ?Vitamin D (Ergocalciferol) 1.25 MG (50000 UNIT) Caps capsule ?Commonly known as: DRISDOL ?Take 50,000 Units by mouth once a week. ?  ?witch hazel-glycerin pad ?Commonly known as: TUCKS ?Apply topically as needed for hemorrhoids. ?  ? ?  ? ? Follow-up Information   ? ? [name], MD Follow up in 1 week(s).   ?Specialty: Internal Medicine ?Contact information: ?[address] ?[address] ?[place] [address] ?[phone] ? ? ?  ?  ? ? Vida Rigger, MD Follow up.   ?Specialty: Pulmonary Disease ?Why: Keep appointment on thursday ?Contact information: ?[address] ?[place] [address] ?[phone] ? ? ?  ?  ? ?  ?  ? ?  ? ?Discharge Exam: ?Filed Weights  ?  07/25/21 1603 07/25/21 2024  ?Weight: 80.7 kg 79.3 kg  ? ?Physical Exam ?HENT:  ?   Head: Normocephalic.  ?   Mouth/Throat:  ?   Pharynx: No oropharyngeal exudate.  ?Eyes:  ?   General: Lids are normal.  ?   Conjunctiva/sclera: Conjunctivae normal.  ?Cardiovascular:  ?   Rate and Rhythm: Normal rate and regular rhythm.  ?   Heart sounds: Normal heart sounds, S1 normal and S2  normal.  ?Pulmonary:  ?   Breath sounds: Examination of the right-lower field reveals rhonchi. Examination of the left-lower field reveals rhonchi. Rhonchi present. No decreased breath sounds, wheezing or rales.  ?Abdominal:  ?   Palpations: Abdomen is soft.  ?   Tenderness: There is no abdominal tenderness.  ?Musculoskeletal:  ?   Right lower leg: No swelling.  ?   Left lower leg: No swelling.  ?Skin: ?   General: Skin is warm.  ?   Findings: No rash.  ?Neurological:  ?   Mental Status: He is alert and oriented to person, place, and time.  ?  ? ?Condition at discharge: stable ? ?The results of significant diagnostics from this hospitalization (including imaging, microbiology, ancillary and laboratory) are listed below for reference.  ? ?Imaging Studies: ?DG Chest 2 View ? ?Result Date: 07/25/2021 ?CLINICAL DATA:  Short of breath for 2 weeks, hypoxia EXAM: CHEST - 2 VIEW COMPARISON:  07/24/2021 FINDINGS: Frontal and lateral views of the chest demonstrate persistent bibasilar airspace disease and small bilateral effusions. No pneumothorax. Cardiac silhouette is unremarkable. No acute bony abnormalities. IMPRESSION: 1. Bibasilar airspace disease consistent with pneumonia. 2. Small bilateral pleural effusions. Electronically Signed   By: Sharlet Salina M.D.   On: 07/25/2021 16:38  ? ?CT CHEST W CONTRAST ? ?Result Date: 07/25/2021 ?CLINICAL DATA:  Worsening shortness of breath, fatigue, previous tobacco abuse, rheumatoid arthritis, recent diagnosis of pneumonia EXAM: CT CHEST WITH CONTRAST TECHNIQUE: Multidetector CT imaging of the chest was performed during intravenous contrast administration. RADIATION DOSE REDUCTION: This exam was performed according to the departmental dose-optimization program which includes automated exposure control, adjustment of the mA and/or kV according to patient size and/or use of iterative reconstruction technique. CONTRAST:  75mL OMNIPAQUE IOHEXOL 300 MG/ML  SOLN COMPARISON:  07/09/2021  FINDINGS: Cardiovascular: The heart is unremarkable without pericardial effusion. No evidence of thoracic aortic aneurysm or dissection. Mediastinum/Nodes: No pathologic adenopathy within the mediastinum, hila, or axillary regions. Numerous subcentimeter lymph nodes are nonspecific. Thyroid, trachea, and esophagus are unremarkable. Lungs/Pleura: There is dense multifocal airspace disease at the lung bases, most pronounced within the lingula and bilateral lower lobes. Within the lower lobes, there are small cystic areas which could reflect central cavitation. Small left pleural effusion. No pneumothorax. Mild emphysema at the lung apices. Central airways are patent. Upper Abdomen: No acute abnormality. Musculoskeletal: There are no acute or destructive bony lesions. Reconstructed images demonstrate no additional findings. IMPRESSION: 1. Multifocal bibasilar airspace disease consistent with bronchopneumonia. Cystic areas within the lower lobes compatible with cavitation. Given the patient's known diagnosis of rheumatoid arthritis and immunosuppressive therapy, atypical infection should be considered. The appearance is not typical for cavitating rheumatoid nodules. 2. Small left pleural effusion. 3. Subcentimeter mediastinal, hilar, and axillary lymph nodes, nonspecific. No pathologic adenopathy. These may be reactive. These results were called by telephone at the time of interpretation on 07/25/2021 at 6:01 pm to ER provider DR Derrill Kay, who verbally acknowledged these results. Electronically Signed   By: Maxwell Caul.D.  On: 07/25/2021 18:04  ? ?CT Angio Chest Pulmonary Embolism (PE) W or WO Contrast ? ?Result Date: 07/25/2021 ?CLINICAL DATA:  Short of breath for 2 weeks, dyspnea on exertion, positive D dimer EXAM: CT ANGIOGRAPHY CHEST WITH CONTRAST TECHNIQUE: Multidetector CT imaging of the chest was performed using the standard protocol during bolus administration of intravenous contrast. Multiplanar CT image  reconstructions and MIPs were obtained to evaluate the vascular anatomy. RADIATION DOSE REDUCTION: This exam was performed according to the departmental dose-optimization program which includes automated expo

## 2021-07-27 NOTE — Assessment & Plan Note (Signed)
Likely secondary to rheumatoid arthritis.  500 mL of fluid drawn off from underneath the left lung.  Follow-up with pulmonary as outpatient.  High-dose prednisone 60 mg daily for 1 week and taper down by 5 mg every week down to his usual 10 mg. ?

## 2021-07-28 LAB — ACID FAST SMEAR (AFB, MYCOBACTERIA): Acid Fast Smear: NEGATIVE

## 2021-07-28 LAB — T-HELPER CELLS CD4/CD8 %
% CD 4 Pos. Lymph.: 55.9 % (ref 30.8–58.5)
Absolute CD 4 Helper: 447 /uL (ref 359–1519)
Basophils Absolute: 0 10*3/uL (ref 0.0–0.2)
Basos: 0 %
CD3+CD4+ Cells/CD3+CD8+ Cells Bld: 3.16 (ref 0.92–3.72)
CD3+CD8+ Cells # Bld: 142 /uL (ref 109–897)
CD3+CD8+ Cells NFr Bld: 17.7 % (ref 12.0–35.5)
EOS (ABSOLUTE): 0.1 10*3/uL (ref 0.0–0.4)
Eos: 2 %
Hematocrit: 39.2 % (ref 37.5–51.0)
Hemoglobin: 13.2 g/dL (ref 13.0–17.7)
Immature Grans (Abs): 0 10*3/uL (ref 0.0–0.1)
Immature Granulocytes: 0 %
Lymphocytes Absolute: 0.8 10*3/uL (ref 0.7–3.1)
Lymphs: 26 %
MCH: 31.1 pg (ref 26.6–33.0)
MCHC: 33.7 g/dL (ref 31.5–35.7)
MCV: 92 fL (ref 79–97)
Monocytes Absolute: 0.4 10*3/uL (ref 0.1–0.9)
Monocytes: 15 %
Neutrophils Absolute: 1.7 10*3/uL (ref 1.4–7.0)
Neutrophils: 57 %
Platelets: 149 10*3/uL — ABNORMAL LOW (ref 150–450)
RBC: 4.25 x10E6/uL (ref 4.14–5.80)
RDW: 12.9 % (ref 11.6–15.4)
WBC: 3 10*3/uL — ABNORMAL LOW (ref 3.4–10.8)

## 2021-07-28 LAB — ANCA PROFILE
Anti-MPO Antibodies: 0.2 units (ref 0.0–0.9)
Anti-PR3 Antibodies: <0.2 units (ref 0.0–0.9)
Atypical P-ANCA titer: <1:20 {titer}
C-ANCA: <1:20 {titer}
P-ANCA: <1:20 {titer}

## 2021-07-28 LAB — COMP PANEL: LEUKEMIA/LYMPHOMA

## 2021-07-28 LAB — ANGIOTENSIN CONVERTING ENZYME: Angiotensin-Converting Enzyme: 63 U/L (ref 14–82)

## 2021-07-29 LAB — CD19 AND CD20, FLOW CYTOMETRY

## 2021-07-29 LAB — CYTOLOGY - NON PAP

## 2021-07-30 LAB — BODY FLUID CULTURE W GRAM STAIN: Culture: NO GROWTH

## 2021-07-30 LAB — FUNGITELL, SERUM: Fungitell Result: <31 pg/mL (ref <80)

## 2021-07-31 ENCOUNTER — Other Ambulatory Visit (HOSPITAL_COMMUNITY): Payer: Self-pay | Admitting: Pulmonary Disease

## 2021-07-31 ENCOUNTER — Other Ambulatory Visit: Payer: Self-pay | Admitting: Pulmonary Disease

## 2021-07-31 DIAGNOSIS — R627 Adult failure to thrive: Secondary | ICD-10-CM

## 2021-08-02 LAB — HISTOPLASMA GAL'MANNAN AG SER: Histoplasma Gal'mannan Ag Ser: <0.5 (ref <0.5)

## 2021-08-05 DIAGNOSIS — J849 Interstitial pulmonary disease, unspecified: Secondary | ICD-10-CM | POA: Diagnosis present

## 2021-08-05 DIAGNOSIS — Z7960 Long term (current) use of unspecified immunomodulators and immunosuppressants: Secondary | ICD-10-CM | POA: Insufficient documentation

## 2021-08-12 LAB — MISC LABCORP TEST (SEND OUT)
LabCorp test name: 5367
Labcorp test code: 5367

## 2021-08-14 ENCOUNTER — Encounter (HOSPITAL_COMMUNITY): Admission: RE | Admit: 2021-08-14 | Payer: 59 | Source: Ambulatory Visit

## 2021-08-14 ENCOUNTER — Encounter (HOSPITAL_COMMUNITY): Payer: Self-pay

## 2021-08-20 ENCOUNTER — Ambulatory Visit
Admission: RE | Admit: 2021-08-20 | Discharge: 2021-08-20 | Disposition: A | Discharge status: Home / Self Care | Payer: 59 | Source: Ambulatory Visit | Attending: Pulmonary Disease | Admitting: Pulmonary Disease

## 2021-08-20 ENCOUNTER — Other Ambulatory Visit: Payer: Self-pay

## 2021-08-20 DIAGNOSIS — R634 Abnormal weight loss: Secondary | ICD-10-CM | POA: Diagnosis present

## 2021-08-20 DIAGNOSIS — J90 Pleural effusion, not elsewhere classified: Secondary | ICD-10-CM | POA: Insufficient documentation

## 2021-08-20 DIAGNOSIS — R590 Localized enlarged lymph nodes: Secondary | ICD-10-CM | POA: Diagnosis not present

## 2021-08-20 DIAGNOSIS — R627 Adult failure to thrive: Secondary | ICD-10-CM | POA: Insufficient documentation

## 2021-08-20 LAB — GLUCOSE, CAPILLARY: Glucose-Capillary: 210 mg/dL — ABNORMAL HIGH (ref 70–99)

## 2021-08-20 MED ORDER — FLUDEOXYGLUCOSE F - 18 (FDG) INJECTION
9.0000 | Freq: Once | INTRAVENOUS | Status: AC | PRN
  Administered 2021-08-20: 9.05 via INTRAVENOUS

## 2021-08-27 LAB — FUNGUS CULTURE RESULT

## 2021-08-27 LAB — FUNGUS CULTURE WITH STAIN

## 2021-08-27 LAB — FUNGAL ORGANISM REFLEX

## 2021-09-01 ENCOUNTER — Encounter: Payer: Self-pay | Admitting: Intensive Care

## 2021-09-01 ENCOUNTER — Emergency Department: Payer: 59

## 2021-09-01 ENCOUNTER — Inpatient Hospital Stay
Admission: EM | Admit: 2021-09-01 | Discharge: 2021-09-14 | DRG: 199 | Disposition: A | Discharge status: Home Health | Payer: 59 | Attending: Obstetrics and Gynecology | Admitting: Obstetrics and Gynecology

## 2021-09-01 ENCOUNTER — Other Ambulatory Visit: Payer: Self-pay

## 2021-09-01 DIAGNOSIS — R1310 Dysphagia, unspecified: Secondary | ICD-10-CM

## 2021-09-01 DIAGNOSIS — T378X5A Adverse effect of other specified systemic anti-infectives and antiparasitics, initial encounter: Secondary | ICD-10-CM | POA: Diagnosis present

## 2021-09-01 DIAGNOSIS — T380X5A Adverse effect of glucocorticoids and synthetic analogues, initial encounter: Secondary | ICD-10-CM | POA: Diagnosis present

## 2021-09-01 DIAGNOSIS — E876 Hypokalemia: Secondary | ICD-10-CM

## 2021-09-01 DIAGNOSIS — J189 Pneumonia, unspecified organism: Secondary | ICD-10-CM | POA: Diagnosis present

## 2021-09-01 DIAGNOSIS — R1312 Dysphagia, oropharyngeal phase: Secondary | ICD-10-CM | POA: Diagnosis not present

## 2021-09-01 DIAGNOSIS — Z8249 Family history of ischemic heart disease and other diseases of the circulatory system: Secondary | ICD-10-CM | POA: Diagnosis not present

## 2021-09-01 DIAGNOSIS — B3781 Candidal esophagitis: Secondary | ICD-10-CM | POA: Diagnosis present

## 2021-09-01 DIAGNOSIS — Z83438 Family history of other disorder of lipoprotein metabolism and other lipidemia: Secondary | ICD-10-CM | POA: Diagnosis not present

## 2021-09-01 DIAGNOSIS — J392 Other diseases of pharynx: Secondary | ICD-10-CM | POA: Diagnosis present

## 2021-09-01 DIAGNOSIS — Z79620 Long term (current) use of immunosuppressive biologic: Secondary | ICD-10-CM | POA: Diagnosis not present

## 2021-09-01 DIAGNOSIS — D849 Immunodeficiency, unspecified: Secondary | ICD-10-CM | POA: Diagnosis present

## 2021-09-01 DIAGNOSIS — K219 Gastro-esophageal reflux disease without esophagitis: Secondary | ICD-10-CM | POA: Diagnosis present

## 2021-09-01 DIAGNOSIS — Z681 Body mass index (BMI) 19 or less, adult: Secondary | ICD-10-CM | POA: Diagnosis not present

## 2021-09-01 DIAGNOSIS — E1165 Type 2 diabetes mellitus with hyperglycemia: Secondary | ICD-10-CM | POA: Diagnosis present

## 2021-09-01 DIAGNOSIS — R634 Abnormal weight loss: Secondary | ICD-10-CM | POA: Diagnosis not present

## 2021-09-01 DIAGNOSIS — R079 Chest pain, unspecified: Principal | ICD-10-CM

## 2021-09-01 DIAGNOSIS — E43 Unspecified severe protein-calorie malnutrition: Secondary | ICD-10-CM | POA: Insufficient documentation

## 2021-09-01 DIAGNOSIS — R042 Hemoptysis: Secondary | ICD-10-CM | POA: Diagnosis not present

## 2021-09-01 DIAGNOSIS — H539 Unspecified visual disturbance: Secondary | ICD-10-CM | POA: Diagnosis present

## 2021-09-01 DIAGNOSIS — J188 Other pneumonia, unspecified organism: Secondary | ICD-10-CM | POA: Diagnosis present

## 2021-09-01 DIAGNOSIS — R0781 Pleurodynia: Secondary | ICD-10-CM

## 2021-09-01 DIAGNOSIS — M0579 Rheumatoid arthritis with rheumatoid factor of multiple sites without organ or systems involvement: Secondary | ICD-10-CM | POA: Diagnosis not present

## 2021-09-01 DIAGNOSIS — R748 Abnormal levels of other serum enzymes: Secondary | ICD-10-CM

## 2021-09-01 DIAGNOSIS — K222 Esophageal obstruction: Secondary | ICD-10-CM | POA: Diagnosis present

## 2021-09-01 DIAGNOSIS — J982 Interstitial emphysema: Principal | ICD-10-CM | POA: Diagnosis present

## 2021-09-01 DIAGNOSIS — Z7952 Long term (current) use of systemic steroids: Secondary | ICD-10-CM

## 2021-09-01 DIAGNOSIS — J90 Pleural effusion, not elsewhere classified: Secondary | ICD-10-CM | POA: Diagnosis present

## 2021-09-01 DIAGNOSIS — R739 Hyperglycemia, unspecified: Secondary | ICD-10-CM | POA: Diagnosis not present

## 2021-09-01 DIAGNOSIS — J690 Pneumonitis due to inhalation of food and vomit: Secondary | ICD-10-CM | POA: Diagnosis not present

## 2021-09-01 DIAGNOSIS — J390 Retropharyngeal and parapharyngeal abscess: Secondary | ICD-10-CM | POA: Diagnosis not present

## 2021-09-01 DIAGNOSIS — Z87891 Personal history of nicotine dependence: Secondary | ICD-10-CM

## 2021-09-01 DIAGNOSIS — Y92009 Unspecified place in unspecified non-institutional (private) residence as the place of occurrence of the external cause: Secondary | ICD-10-CM

## 2021-09-01 DIAGNOSIS — E878 Other disorders of electrolyte and fluid balance, not elsewhere classified: Secondary | ICD-10-CM

## 2021-09-01 DIAGNOSIS — M0510 Rheumatoid lung disease with rheumatoid arthritis of unspecified site: Secondary | ICD-10-CM | POA: Diagnosis present

## 2021-09-01 DIAGNOSIS — Z931 Gastrostomy status: Secondary | ICD-10-CM

## 2021-09-01 LAB — BASIC METABOLIC PANEL
Anion gap: 7 (ref 5–15)
BUN: 12 mg/dL (ref 6–20)
CO2: 24 mmol/L (ref 22–32)
Calcium: 8.7 mg/dL — ABNORMAL LOW (ref 8.9–10.3)
Chloride: 104 mmol/L (ref 98–111)
Creatinine, Ser: 0.9 mg/dL (ref 0.61–1.24)
GFR, Estimated: >60 mL/min (ref >60)
Glucose, Bld: 250 mg/dL — ABNORMAL HIGH (ref 70–99)
Potassium: 3.7 mmol/L (ref 3.5–5.1)
Sodium: 135 mmol/L (ref 135–145)

## 2021-09-01 LAB — HEPATIC FUNCTION PANEL
ALT: 52 U/L — ABNORMAL HIGH (ref 0–44)
AST: 33 U/L (ref 15–41)
Albumin: 3 g/dL — ABNORMAL LOW (ref 3.5–5.0)
Alkaline Phosphatase: 63 U/L (ref 38–126)
Bilirubin, Direct: 0.1 mg/dL (ref 0.0–0.2)
Indirect Bilirubin: 0.7 mg/dL (ref 0.3–0.9)
Total Bilirubin: 0.8 mg/dL (ref 0.3–1.2)
Total Protein: 7.1 g/dL (ref 6.5–8.1)

## 2021-09-01 LAB — CBC
HCT: 40.1 % (ref 39.0–52.0)
Hemoglobin: 13.5 g/dL (ref 13.0–17.0)
MCH: 30.8 pg (ref 26.0–34.0)
MCHC: 33.7 g/dL (ref 30.0–36.0)
MCV: 91.6 fL (ref 80.0–100.0)
Platelets: 197 10*3/uL (ref 150–400)
RBC: 4.38 MIL/uL (ref 4.22–5.81)
RDW: 12.7 % (ref 11.5–15.5)
WBC: 6.2 10*3/uL (ref 4.0–10.5)
nRBC: 0 % (ref 0.0–0.2)

## 2021-09-01 LAB — LIPASE, BLOOD: Lipase: 45 U/L (ref 11–51)

## 2021-09-01 LAB — TROPONIN I (HIGH SENSITIVITY)
Troponin I (High Sensitivity): 9 ng/L (ref <18)
Troponin I (High Sensitivity): 11 ng/L (ref <18)

## 2021-09-01 LAB — SURGICAL PCR SCREEN
MRSA, PCR: NEGATIVE
Staphylococcus aureus: NEGATIVE

## 2021-09-01 LAB — BRAIN NATRIURETIC PEPTIDE: B Natriuretic Peptide: 22.9 pg/mL (ref 0.0–100.0)

## 2021-09-01 LAB — PROCALCITONIN: Procalcitonin: <0.1 ng/mL

## 2021-09-01 MED ORDER — SULFAMETHOXAZOLE-TRIMETHOPRIM 400-80 MG PO TABS
1.0000 | ORAL_TABLET | ORAL | Status: DC
  Administered 2021-09-02: 1 via ORAL
  Filled 2021-09-01: qty 1

## 2021-09-01 MED ORDER — UMECLIDINIUM BROMIDE 62.5 MCG/ACT IN AEPB
1.0000 | INHALATION_SPRAY | Freq: Every day | RESPIRATORY_TRACT | Status: DC
  Administered 2021-09-02 – 2021-09-13 (×10): 1 via RESPIRATORY_TRACT
  Filled 2021-09-01 (×2): qty 7

## 2021-09-01 MED ORDER — SODIUM CHLORIDE 0.9 % IV SOLN: 2.0000 g | INTRAVENOUS | Status: DC

## 2021-09-01 MED ORDER — HYDROXYCHLOROQUINE SULFATE 200 MG PO TABS
200.0000 mg | ORAL_TABLET | Freq: Two times a day (BID) | ORAL | Status: DC
  Administered 2021-09-01 – 2021-09-02 (×2): 200 mg via ORAL
  Filled 2021-09-01 (×2): qty 1

## 2021-09-01 MED ORDER — SODIUM CHLORIDE 0.9 % IV SOLN
500.0000 mg | INTRAVENOUS | Status: AC
  Administered 2021-09-02 – 2021-09-05 (×4): 500 mg via INTRAVENOUS
  Filled 2021-09-01 (×2): qty 5
  Filled 2021-09-01: qty 500
  Filled 2021-09-01: qty 5

## 2021-09-01 MED ORDER — ACETAMINOPHEN 325 MG PO TABS
650.0000 mg | ORAL_TABLET | Freq: Four times a day (QID) | ORAL | Status: DC | PRN
  Administered 2021-09-01 – 2021-09-04 (×5): 650 mg via ORAL
  Filled 2021-09-01 (×5): qty 2

## 2021-09-01 MED ORDER — LIDOCAINE VISCOUS HCL 2 % MT SOLN
15.0000 mL | Freq: Once | OROMUCOSAL | Status: AC
  Administered 2021-09-01: 15 mL via ORAL
  Filled 2021-09-01: qty 15

## 2021-09-01 MED ORDER — ONDANSETRON HCL 4 MG PO TABS: 4.0000 mg | ORAL_TABLET | Freq: Four times a day (QID) | ORAL | Status: DC | PRN

## 2021-09-01 MED ORDER — IPRATROPIUM-ALBUTEROL 0.5-2.5 (3) MG/3ML IN SOLN
3.0000 mL | Freq: Four times a day (QID) | RESPIRATORY_TRACT | Status: DC
Start: 2021-09-01 — End: 2021-09-02
  Administered 2021-09-01 – 2021-09-02 (×2): 3 mL via RESPIRATORY_TRACT
  Filled 2021-09-01 (×2): qty 3

## 2021-09-01 MED ORDER — PREDNISONE 10 MG PO TABS
30.0000 mg | ORAL_TABLET | Freq: Every day | ORAL | Status: DC
  Administered 2021-09-01 – 2021-09-03 (×3): 30 mg via ORAL
  Filled 2021-09-01 (×3): qty 3

## 2021-09-01 MED ORDER — SUCRALFATE 1 GM/10ML PO SUSP
1.0000 g | Freq: Once | ORAL | Status: DC
  Filled 2021-09-01 (×3): qty 10

## 2021-09-01 MED ORDER — SODIUM CHLORIDE 0.9 % IV SOLN: INTRAVENOUS | Status: DC

## 2021-09-01 MED ORDER — ONDANSETRON HCL 4 MG/2ML IJ SOLN
4.0000 mg | Freq: Once | INTRAMUSCULAR | Status: AC
Start: 2021-09-01 — End: 2021-09-01
  Administered 2021-09-01: 4 mg via INTRAVENOUS
  Filled 2021-09-01: qty 2

## 2021-09-01 MED ORDER — SODIUM CHLORIDE 0.9 % IV SOLN
2.0000 g | Freq: Once | INTRAVENOUS | Status: AC
  Administered 2021-09-01: 2 g via INTRAVENOUS
  Filled 2021-09-01: qty 12.5

## 2021-09-01 MED ORDER — MORPHINE SULFATE (PF) 2 MG/ML IV SOLN
2.0000 mg | INTRAVENOUS | Status: DC | PRN
  Administered 2021-09-01 – 2021-09-05 (×11): 2 mg via INTRAVENOUS
  Filled 2021-09-01 (×12): qty 1

## 2021-09-01 MED ORDER — TRAZODONE HCL 50 MG PO TABS: 25.0000 mg | ORAL_TABLET | Freq: Every evening | ORAL | Status: DC | PRN

## 2021-09-01 MED ORDER — BUDESON-GLYCOPYRROL-FORMOTEROL 160-9-4.8 MCG/ACT IN AERO: 2.0000 | INHALATION_SPRAY | Freq: Two times a day (BID) | RESPIRATORY_TRACT | Status: DC

## 2021-09-01 MED ORDER — VANCOMYCIN HCL 1250 MG/250ML IV SOLN
1250.0000 mg | Freq: Two times a day (BID) | INTRAVENOUS | Status: DC
  Administered 2021-09-02: 1250 mg via INTRAVENOUS
  Filled 2021-09-01: qty 250

## 2021-09-01 MED ORDER — IOHEXOL 350 MG/ML SOLN
50.0000 mL | Freq: Once | INTRAVENOUS | Status: AC | PRN
  Administered 2021-09-01: 50 mL via INTRAVENOUS

## 2021-09-01 MED ORDER — VITAMIN D (ERGOCALCIFEROL) 1.25 MG (50000 UNIT) PO CAPS
50000.0000 [IU] | ORAL_CAPSULE | ORAL | Status: DC
  Administered 2021-09-02 – 2021-09-09 (×2): 50000 [IU] via ORAL
  Filled 2021-09-01 (×2): qty 1

## 2021-09-01 MED ORDER — ONDANSETRON HCL 4 MG/2ML IJ SOLN: 4.0000 mg | Freq: Four times a day (QID) | INTRAMUSCULAR | Status: DC | PRN

## 2021-09-01 MED ORDER — PNEUMOCOCCAL 20-VAL CONJ VACC 0.5 ML IM SUSY
0.5000 mL | PREFILLED_SYRINGE | INTRAMUSCULAR | Status: DC
  Filled 2021-09-01 (×2): qty 0.5

## 2021-09-01 MED ORDER — GUAIFENESIN ER 600 MG PO TB12
600.0000 mg | ORAL_TABLET | Freq: Two times a day (BID) | ORAL | Status: DC
  Administered 2021-09-01 – 2021-09-02 (×2): 600 mg via ORAL
  Filled 2021-09-01 (×2): qty 1

## 2021-09-01 MED ORDER — ALUM & MAG HYDROXIDE-SIMETH 200-200-20 MG/5ML PO SUSP
30.0000 mL | Freq: Once | ORAL | Status: AC
  Administered 2021-09-01: 30 mL via ORAL
  Filled 2021-09-01: qty 30

## 2021-09-01 MED ORDER — SODIUM CHLORIDE 0.9 % IV SOLN
500.0000 mg | Freq: Once | INTRAVENOUS | Status: AC
  Administered 2021-09-01: 500 mg via INTRAVENOUS
  Filled 2021-09-01: qty 5

## 2021-09-01 MED ORDER — MORPHINE SULFATE (PF) 4 MG/ML IV SOLN
4.0000 mg | Freq: Once | INTRAVENOUS | Status: AC
  Administered 2021-09-01: 4 mg via INTRAVENOUS
  Filled 2021-09-01: qty 1

## 2021-09-01 MED ORDER — CEFEPIME HCL 2 G IV SOLR: 2.0000 g | Freq: Two times a day (BID) | INTRAVENOUS | Status: DC

## 2021-09-01 MED ORDER — ACETAMINOPHEN 650 MG RE SUPP: 650.0000 mg | Freq: Four times a day (QID) | RECTAL | Status: DC | PRN

## 2021-09-01 MED ORDER — VANCOMYCIN HCL 1500 MG/300ML IV SOLN
1500.0000 mg | Freq: Once | INTRAVENOUS | Status: AC
  Administered 2021-09-01: 1500 mg via INTRAVENOUS
  Filled 2021-09-01: qty 300

## 2021-09-01 MED ORDER — FLUTICASONE FUROATE-VILANTEROL 200-25 MCG/ACT IN AEPB
1.0000 | INHALATION_SPRAY | Freq: Every day | RESPIRATORY_TRACT | Status: DC
  Administered 2021-09-02 – 2021-09-14 (×10): 1 via RESPIRATORY_TRACT
  Filled 2021-09-01 (×2): qty 28

## 2021-09-01 MED ORDER — MYCOPHENOLATE MOFETIL 250 MG PO CAPS
500.0000 mg | ORAL_CAPSULE | Freq: Two times a day (BID) | ORAL | Status: DC
  Administered 2021-09-01 – 2021-09-03 (×4): 500 mg via ORAL
  Filled 2021-09-01 (×4): qty 2

## 2021-09-01 MED ORDER — SODIUM CHLORIDE 0.9 % IV SOLN
2.0000 g | Freq: Three times a day (TID) | INTRAVENOUS | Status: AC
  Administered 2021-09-02 – 2021-09-06 (×15): 2 g via INTRAVENOUS
  Filled 2021-09-01 (×4): qty 12.5
  Filled 2021-09-01: qty 2
  Filled 2021-09-01 (×10): qty 12.5

## 2021-09-01 MED ORDER — MAGNESIUM HYDROXIDE 400 MG/5ML PO SUSP: 30.0000 mL | Freq: Every day | ORAL | Status: DC | PRN

## 2021-09-01 MED ORDER — ENOXAPARIN SODIUM 40 MG/0.4ML IJ SOSY
40.0000 mg | PREFILLED_SYRINGE | INTRAMUSCULAR | Status: DC
  Administered 2021-09-01 – 2021-09-08 (×8): 40 mg via SUBCUTANEOUS
  Filled 2021-09-01 (×10): qty 0.4

## 2021-09-01 MED ORDER — IPRATROPIUM-ALBUTEROL 0.5-2.5 (3) MG/3ML IN SOLN: 3.0000 mL | RESPIRATORY_TRACT | Status: DC | PRN

## 2021-09-01 NOTE — ED Provider Notes (Signed)
[facility] Provider Note    Event Date/Time   First MD Initiated Contact with Patient 09/01/21 1425     (approximate)   History   Chest Pain   HPI  Sean [name] is a 46 y.o. male   with history of rheumatoid arthritis, interstitial lung disease, here with chest and abdominal pain.  The patient states that he was recently switched to CellCept.  Since then, he has had stabbing, burning, epigastric and chest pain.  It is worse when he swallows.  He is also had shortness of breath and pain when he takes a deep inspiration.  Patient has a history of previous RA associated pleural effusions requiring thoracentesis, and states he has felt mildly short of breath.  He has been taking medications without relief.  Denies any known fevers.  Denies any sputum production.  No hemoptysis.      Physical Exam   Triage Vital Signs: ED Triage Vitals  Enc Vitals Group     BP 09/01/21 1226 127/82     Pulse Rate 09/01/21 1226 (!) 102     Resp 09/01/21 1226 (!) 22     Temp 09/01/21 1226 98.3 F (36.8 C)     Temp Source 09/01/21 1226 Oral     SpO2 09/01/21 1226 (!) 88 %     Weight 09/01/21 1223 160 lb (72.6 kg)     Height 09/01/21 1223  (1.956 m)     Head Circumference --      Peak Flow --      Pain Score 09/01/21 1222 10     Pain Loc --      Pain Edu? --      Excl. in GC? --     Most recent vital signs: Vitals:   09/01/21 1933 09/01/21 2013  BP:  119/82  Pulse:  (!) 105  Resp:  18  Temp: 100 F (37.8 C) 100.1 F (37.8 C)  SpO2:  97%     General: Awake, appears mildly uncomfortable. CV:  Good peripheral perfusion.  Tachycardic. Resp:  Normal effort.  Bilateral rales with coarse breath sounds. Abd:  No distention.  Minimal epigastric tenderness.  No rebound or guarding. Other:  Dry mucous membranes.  Oropharynx clear without plaques.   ED Results / Procedures / Treatments   Labs (all labs ordered are listed, but only abnormal results are  displayed) Labs Reviewed  BASIC METABOLIC PANEL - Abnormal; Notable for the following components:      Result Value   Glucose, Bld 250 (*)    Calcium 8.7 (*)    All other components within normal limits  HEPATIC FUNCTION PANEL - Abnormal; Notable for the following components:   Albumin 3.0 (*)    ALT 52 (*)    All other components within normal limits  CBC  LIPASE, BLOOD  PROCALCITONIN  BRAIN NATRIURETIC PEPTIDE  BASIC METABOLIC PANEL  CBC  TROPONIN I (HIGH SENSITIVITY)  TROPONIN I (HIGH SENSITIVITY)     EKG Sinus tachycardia, ventricular rate 102.  PR 134, QRS 76, QTc 4 9.  No acute ST elevations or depressions.  Nonspecific, likely rate related changes.   RADIOLOGY Chest x-ray: Bibasilar atelectasis/infiltrates   I also independently reviewed and agree with radiologist interpretations.   PROCEDURES:  Critical Care performed: No  .1-3 Lead EKG Interpretation Performed by: Shaune Pollack, MD Authorized by: Shaune Pollack, MD     Interpretation: normal     ECG rate:  90-100  ECG rate assessment: tachycardic     Rhythm: sinus rhythm     Ectopy: none     Conduction: normal   Comments:     Indication: chest pain    MEDICATIONS ORDERED IN ED: Medications  sucralfate (CARAFATE) 1 GM/10ML suspension 1 g (0 g Oral Not Given 09/01/21 1718)  vancomycin (VANCOREADY) IVPB 1500 mg/300 mL (1,500 mg Intravenous New Bag/Given 09/01/21 1922)  azithromycin (ZITHROMAX) 500 mg in sodium chloride 0.9 % 250 mL IVPB (500 mg Intravenous New Bag/Given 09/01/21 1923)  enoxaparin (LOVENOX) injection 40 mg (has no administration in time range)  0.9 %  sodium chloride infusion (has no administration in time range)  acetaminophen (TYLENOL) tablet 650 mg (has no administration in time range)    Or  acetaminophen (TYLENOL) suppository 650 mg (has no administration in time range)  traZODone (DESYREL) tablet 25 mg (has no administration in time range)  magnesium hydroxide (MILK OF  MAGNESIA) suspension 30 mL (has no administration in time range)  ondansetron (ZOFRAN) tablet 4 mg (has no administration in time range)    Or  ondansetron (ZOFRAN) injection 4 mg (has no administration in time range)  azithromycin (ZITHROMAX) 500 mg in sodium chloride 0.9 % 250 mL IVPB (has no administration in time range)  ceFEPIme (MAXIPIME) 2 g in sodium chloride 0.9 % 100 mL IVPB (has no administration in time range)  guaiFENesin (MUCINEX) 12 hr tablet 600 mg (has no administration in time range)  ipratropium-albuterol (DUONEB) 0.5-2.5 (3) MG/3ML nebulizer solution 3 mL (has no administration in time range)  ipratropium-albuterol (DUONEB) 0.5-2.5 (3) MG/3ML nebulizer solution 3 mL (has no administration in time range)  hydroxychloroquine (PLAQUENIL) tablet 200 mg (has no administration in time range)  sulfamethoxazole-trimethoprim (BACTRIM) 400-80 MG per tablet 1 tablet (has no administration in time range)  predniSONE (DELTASONE) tablet 10 mg (has no administration in time range)  mycophenolate (CELLCEPT) tablet 500 mg (has no administration in time range)  Vitamin D (Ergocalciferol) (DRISDOL) capsule 50,000 Units (has no administration in time range)  morphine (PF) 2 MG/ML injection 2 mg (has no administration in time range)  alum & mag hydroxide-simeth (MAALOX/MYLANTA) 200-200-20 MG/5ML suspension 30 mL (30 mLs Oral Given 09/01/21 1546)    And  lidocaine (XYLOCAINE) 2 % viscous mouth solution 15 mL (15 mLs Oral Given 09/01/21 1546)  morphine (PF) 4 MG/ML injection 4 mg (4 mg Intravenous Given 09/01/21 1604)  ondansetron (ZOFRAN) injection 4 mg (4 mg Intravenous Given 09/01/21 1601)  iohexol (OMNIPAQUE) 350 MG/ML injection 50 mL (50 mLs Intravenous Contrast Given 09/01/21 1617)  morphine (PF) 4 MG/ML injection 4 mg (4 mg Intravenous Given 09/01/21 1832)  ceFEPIme (MAXIPIME) 2 g in sodium chloride 0.9 % 100 mL IVPB (0 g Intravenous Stopped 09/01/21 1945)     IMPRESSION / MDM / ASSESSMENT AND  PLAN / ED COURSE  I reviewed the triage vital signs and the nursing notes.                               The patient is on the cardiac monitor to evaluate for evidence of arrhythmia and/or significant heart rate changes.   Ddx:  Differential includes the following, with pertinent life- or limb-threatening emergencies considered:  Esophagitis (candidal, medication-related), ILD exacerbation with pleuritis, PE, PNA, atelectasis, less likely CAD/cardiac   MDM:  46 yo M with PMHx RA on Humira, Cellcept, prednisone and bactrim here with chest/abdominal pain. DDx as above. Pt  appears significantly uncomfortable, concern for esophagitis, pleuritis, PE. Denies fevers but is tachycardic, immunosuppressed. CBC without leukocytosis or anemia. BMP is largely unremarkable. Mild hyperglycemia likely related to his steroid use.EKG nonischemic, trop neg, doubt ACS. BNP normal. CXR shows bibasilar infiltrates, and he is s/p recent thoracentesis. Will check CT Angio for further evaluation, plan to likely admit. IVF, analgesia given.    MEDICATIONS GIVEN IN ED: Medications  sucralfate (CARAFATE) 1 GM/10ML suspension 1 g (0 g Oral Not Given 09/01/21 1718)  vancomycin (VANCOREADY) IVPB 1500 mg/300 mL (1,500 mg Intravenous New Bag/Given 09/01/21 1922)  azithromycin (ZITHROMAX) 500 mg in sodium chloride 0.9 % 250 mL IVPB (500 mg Intravenous New Bag/Given 09/01/21 1923)  enoxaparin (LOVENOX) injection 40 mg (has no administration in time range)  0.9 %  sodium chloride infusion (has no administration in time range)  acetaminophen (TYLENOL) tablet 650 mg (has no administration in time range)    Or  acetaminophen (TYLENOL) suppository 650 mg (has no administration in time range)  traZODone (DESYREL) tablet 25 mg (has no administration in time range)  magnesium hydroxide (MILK OF MAGNESIA) suspension 30 mL (has no administration in time range)  ondansetron (ZOFRAN) tablet 4 mg (has no administration in time range)     Or  ondansetron (ZOFRAN) injection 4 mg (has no administration in time range)  azithromycin (ZITHROMAX) 500 mg in sodium chloride 0.9 % 250 mL IVPB (has no administration in time range)  ceFEPIme (MAXIPIME) 2 g in sodium chloride 0.9 % 100 mL IVPB (has no administration in time range)  guaiFENesin (MUCINEX) 12 hr tablet 600 mg (has no administration in time range)  ipratropium-albuterol (DUONEB) 0.5-2.5 (3) MG/3ML nebulizer solution 3 mL (has no administration in time range)  ipratropium-albuterol (DUONEB) 0.5-2.5 (3) MG/3ML nebulizer solution 3 mL (has no administration in time range)  hydroxychloroquine (PLAQUENIL) tablet 200 mg (has no administration in time range)  sulfamethoxazole-trimethoprim (BACTRIM) 400-80 MG per tablet 1 tablet (has no administration in time range)  predniSONE (DELTASONE) tablet 10 mg (has no administration in time range)  mycophenolate (CELLCEPT) tablet 500 mg (has no administration in time range)  Vitamin D (Ergocalciferol) (DRISDOL) capsule 50,000 Units (has no administration in time range)  morphine (PF) 2 MG/ML injection 2 mg (has no administration in time range)  alum & mag hydroxide-simeth (MAALOX/MYLANTA) 200-200-20 MG/5ML suspension 30 mL (30 mLs Oral Given 09/01/21 1546)    And  lidocaine (XYLOCAINE) 2 % viscous mouth solution 15 mL (15 mLs Oral Given 09/01/21 1546)  morphine (PF) 4 MG/ML injection 4 mg (4 mg Intravenous Given 09/01/21 1604)  ondansetron (ZOFRAN) injection 4 mg (4 mg Intravenous Given 09/01/21 1601)  iohexol (OMNIPAQUE) 350 MG/ML injection 50 mL (50 mLs Intravenous Contrast Given 09/01/21 1617)  morphine (PF) 4 MG/ML injection 4 mg (4 mg Intravenous Given 09/01/21 1832)  ceFEPIme (MAXIPIME) 2 g in sodium chloride 0.9 % 100 mL IVPB (0 g Intravenous Stopped 09/01/21 1945)     Consults:     EMR reviewed  Reviewed DC summary 07/27/21,admission for pleural effusion thought 2/2 RA     FINAL CLINICAL IMPRESSION(S) / ED DIAGNOSES   Final  diagnoses:  Chest pain, unspecified type     Rx / DC Orders   ED Discharge Orders     None        Note:  This document was prepared using Dragon voice recognition software and may include unintentional dictation errors.   Shaune PollackIsaacs, Jessicaann Overbaugh, MD 09/01/21 2013

## 2021-09-01 NOTE — ED Notes (Signed)
Secure messaged by RN Jeanice Limholly that ok to bring patient to room IP.

## 2021-09-01 NOTE — ED Notes (Signed)
Pt endorsing chest and esophagus pain at this time. MD [place].

## 2021-09-01 NOTE — Consult Note (Addendum)
Pharmacy Antibiotic Note  Sean [name] is a 46 y.o. male w/ h/o RA and ILD (on CellCept, prednisone and Bactrim for PCP prophylaxis and Plaquenil) admitted on 09/01/2021 with with dyspnea and chest pain undergoing w/u for PNA and broad differential.  Pharmacy has been consulted for Vancomycin dosing.  Plan: F/u MRSA PCR ordered & cultures. Vancomycin 1.5g x1 in ED (5/23 1930); followed by 1.25g q12h (5/24 0700>> Goal AUC 400-600  Est AUC: 510; Cmax: 35; Cmin: 13 SCr 0.9; (TBW<IBW); Vd 0.72  Pt also received cefepime 2g IV x1 in ED (5/23 1915); followed by Cefepime 2g IV q8h (5/24 0400>> Pt also received azithromycin 500mg  IV x1 in ED (5/24 1930); then Azithromycin 500mg  IV q24h (5/24 -5/28)  Height: 6\' 5"  (195.6 cm) Weight: 72.6 kg (160 lb) IBW/kg (Calculated) : 89.1  Temp (24hrs), Avg:99.5 F (37.5 C), Min:98.3 F (36.8 C), Max:100.1 F (37.8 C)  Recent Labs  Lab 09/01/21 1227  WBC 6.2  CREATININE 0.90    Estimated Creatinine Clearance: 107.6 mL/min (by C-G formula based on SCr of 0.9 mg/dL).    No Known Allergies  Antimicrobials this admission: VAN/CFP (5/23 >>  Azith (5/23 >> 5/28) Bactrim SS for ppx (continued from home)  Dose adjustments this admission: CTM and adjust PRN.  Microbiology results: No Cultures drawn yet 5/23 MRSA PCR: ordered  Thank you for allowing pharmacy to be a part of this patient's care.  Martyn MalayBrandon D Pegi Milazzo 09/01/2021 8:23 PM

## 2021-09-01 NOTE — H&P (Signed)
PATIENT NAME: Sean KaufmanGeoffrey [name]    MR#:  [id number]  DATE OF BIRTH:  Dec 24, 1975  DATE OF ADMISSION:  09/01/2021  PRIMARY CARE PHYSICIAN: Sherrie MustacheJadali, Fayegh, MD   Patient is coming from: Home  REQUESTING/REFERRING PHYSICIAN: Georga HackingMcHugh, Kelly Rose, MD   CHIEF COMPLAINT:   Chief Complaint  Patient presents with  . Chest Pain    HISTORY OF PRESENT ILLNESS:  Sean [name] is a 46 y.o. male with medical history significant for ***  ED Course: *** EKG as reviewed by me : *** Imaging: *** PAST MEDICAL HISTORY:   Past Medical History:  Diagnosis Date  . H/O immunosuppressive therapy   . Hyperpigmentation of skin    Hyperpigmentation rash/vesicles of the palm and face  . Neutropenia (HCC)   . RA (rheumatoid arthritis) (HCC)     PAST SURGICAL HISTORY:  History reviewed. No pertinent surgical history.  SOCIAL HISTORY:   Social History   Tobacco Use  . Smoking status: Former    Types: Cigarettes  . Smokeless tobacco: Never  Substance Use Topics  . Alcohol use: Not Currently    FAMILY HISTORY:   Family History  Problem Relation Age of Onset  . Hyperlipidemia Mother   . Hypertension Father     DRUG ALLERGIES:  No Known Allergies  REVIEW OF SYSTEMS:   ROS As per history of present illness. All pertinent systems were reviewed above. Constitutional, HEENT, cardiovascular, respiratory, GI, GU, musculoskeletal, neuro, psychiatric, endocrine, integumentary and hematologic systems were reviewed and are otherwise negative/unremarkable except for positive findings mentioned above in the HPI.   MEDICATIONS AT HOME:   Prior to Admission medications   Medication Sig Start Date End Date Taking? Authorizing Provider  BREZTRI AEROSPHERE 160-9-4.8 MCG/ACT AERO Inhale 2 puffs into the lungs 2 (two) times daily. 08/27/21  Yes [provider]  hydroxychloroquine (PLAQUENIL) 200 MG tablet Take 1 tablet by mouth 2 (two) times daily. 03/26/21  Yes  [provider]  mycophenolate (CELLCEPT) 500 MG tablet Take 1 tablet by mouth every 12 (twelve) hours. 08/27/21  Yes [provider]  predniSONE (DELTASONE) 10 MG tablet 6 tabs (60 mg)po daily for one week; then taper 5mg  every week until down to 10 mg daily 07/27/21  Yes Wieting, Richard, MD  sulfamethoxazole-trimethoprim (BACTRIM) 400-80 MG tablet Take 1 tablet by mouth 2 (two) times daily. Patient taking differently: Take 1 tablet by mouth 3 (three) times a week. 07/27/21  Yes Wieting, Richard, MD  folic acid (FOLVITE) 1 MG tablet Take 1 mg by mouth daily. Patient not taking: Reported on 09/01/2021 07/09/21   [provider]  methotrexate 2.5 MG tablet Take 5 tablets by mouth every 7 (seven) days. Patient not taking: Reported on 09/01/2021 07/09/21   [provider]  Vitamin D, Ergocalciferol, (DRISDOL) 1.25 MG (50000 UNIT) CAPS capsule Take 50,000 Units by mouth once a week. 07/06/21   [provider]  witch hazel-glycerin (TUCKS) pad Apply topically as needed for hemorrhoids. Patient not taking: Reported on 09/01/2021 07/27/21   Alford HighlandWieting, Richard, MD      VITAL SIGNS:  Blood pressure 128/83, pulse (!) 103, temperature 100 F (37.8 C), temperature source Oral, resp. rate (!) 21, height 6\' 5"  (1.956 m), weight 72.6 kg, SpO2 95 %.  PHYSICAL EXAMINATION:  Physical Exam  GENERAL:  46 y.o.-year-old patient lying in the bed with no acute distress.  EYES: Pupils equal, round, reactive to light and accommodation. No scleral icterus. Extraocular muscles  intact.  HEENT: Head atraumatic, normocephalic. Oropharynx and nasopharynx clear.  NECK:  Supple, no jugular venous distention. No thyroid enlargement, no tenderness.  LUNGS: Normal breath sounds bilaterally, no wheezing, rales,rhonchi or crepitation. No use of accessory muscles of respiration.  CARDIOVASCULAR: Regular rate and rhythm, S1, S2 normal. No murmurs, rubs, or gallops.  ABDOMEN: Soft, nondistended,  nontender. Bowel sounds present. No organomegaly or mass.  EXTREMITIES: No pedal edema, cyanosis, or clubbing.  NEUROLOGIC: Cranial nerves II through XII are intact. Muscle strength 5/5 in all extremities. Sensation intact. Gait not checked.  PSYCHIATRIC: The patient is alert and oriented x 3.  Normal affect and good eye contact. SKIN: No obvious rash, lesion, or ulcer.   LABORATORY PANEL:   CBC Recent Labs  Lab 09/01/21 1227  WBC 6.2  HGB 13.5  HCT 40.1  PLT 197   ------------------------------------------------------------------------------------------------------------------  Chemistries  Recent Labs  Lab 09/01/21 1227  NA 135  K 3.7  CL 104  CO2 24  GLUCOSE 250*  BUN 12  CREATININE 0.90  CALCIUM 8.7*  AST 33  ALT 52*  ALKPHOS 63  BILITOT 0.8   ------------------------------------------------------------------------------------------------------------------  Cardiac Enzymes No results for input(s): TROPONINI in the last 168 hours. ------------------------------------------------------------------------------------------------------------------  RADIOLOGY:  DG Chest 2 View  Result Date: 09/01/2021 CLINICAL DATA:  Chest pain. EXAM: CHEST - 2 VIEW COMPARISON:  July 26, 2021.  Aug 20, 2021. FINDINGS: The heart size and mediastinal contours are within normal limits. Stable bibasilar atelectasis or infiltrates are noted with associated pleural effusions. The visualized skeletal structures are unremarkable. IMPRESSION: Stable bibasilar atelectasis or infiltrates are noted with associated pleural effusions. Electronically Signed   By: Lupita Raider M.D.   On: 09/01/2021 12:59   CT CHEST WO CONTRAST  Result Date: 09/01/2021 CLINICAL DATA:  Esophageal perforation with pneumo mediastinum. Right-sided chest pain for a few days. EXAM: CT CHEST WITHOUT CONTRAST TECHNIQUE: Multidetector CT imaging of the chest was performed following the standard protocol without IV contrast.  RADIATION DOSE REDUCTION: This exam was performed according to the departmental dose-optimization program which includes automated exposure control, adjustment of the mA and/or kV according to patient size and/or use of iterative reconstruction technique. COMPARISON:  CT angiography of the chest 09/01/2021 FINDINGS: Cardiovascular: Mild cardiac enlargement. No pericardial effusions. Normal caliber thoracic aorta. Mediastinum/Nodes: Extensive pneumomediastinum is again demonstrated throughout the mediastinum and extending into the base of the neck and supraclavicular regions. Contrast material fills the esophagus. The esophagus is patent to the stomach without any definite contrast extravasation. No significant lymphadenopathy. Lungs/Pleura: Emphysematous changes in the lungs. Coarse multifocal and confluent infiltrates scattered throughout the lungs with consolidation in the lung bases. This may represent multifocal pneumonia. Small bilateral pleural effusions. Airways are patent. Upper Abdomen: No acute abnormalities demonstrated. Musculoskeletal: No chest wall mass or suspicious bone lesions identified. IMPRESSION: 1. Extensive pneumomediastinum as previously demonstrated. Contrast filled esophagus is patent and no extraluminal contrast extravasation is identified. 2. Multiple focal and confluent infiltrates are scattered throughout the lungs, suggesting multifocal pneumonia. Small bilateral pleural effusions. Electronically Signed   By: Burman Nieves M.D.   On: 09/01/2021 18:31   CT Angio Chest PE W and/or Wo Contrast  Result Date: 09/01/2021 CLINICAL DATA:  Pulmonary embolism suspected. High probability. Right-sided chest pain. Rheumatoid arthritis. EXAM: CT ANGIOGRAPHY CHEST WITH CONTRAST TECHNIQUE: Multidetector CT imaging of the chest was performed using the standard protocol during bolus administration of intravenous contrast. Multiplanar CT image reconstructions and MIPs were obtained to evaluate the  vascular anatomy. RADIATION DOSE REDUCTION: This exam was performed according to the departmental dose-optimization program which includes automated exposure control, adjustment of the mA and/or kV according to patient size and/or use of iterative reconstruction technique. CONTRAST:  50mL OMNIPAQUE IOHEXOL 350 MG/ML SOLN COMPARISON:  Chest radiography same day. Prior CT angiography 07/25/2021. FINDINGS: Cardiovascular: Heart size upper limits of normal. No coronary artery calcification or aortic atherosclerotic calcification. Pulmonary arterial opacification is good. There are no pulmonary emboli. Mediastinum/Nodes: There is pneumomediastinum, extending into the lower neck. No evidence of mass. Few small mediastinal nodes as seen previously. Lungs/Pleura: Small pleural effusions layering dependently. Bilateral lower lobe atelectasis/pneumonia with areas of cystic change. This could be due to infectious pneumonia or rheumatoid lung disease. There is no pneumothorax/pleural air. Upper Abdomen: Negative Musculoskeletal: No acute spinal finding. Review of the MIP images confirms the above findings. IMPRESSION: No pulmonary emboli. Acute pneumomediastinum, with extension into the lower neck. No pneumothorax/pleural air. Small effusions, less than were seen previously. Bilateral lower lobe volume loss/pneumonia with areas of cystic change. Findings could be due to infectious pneumonia and or rheumatoid lung disease. Electronically Signed   By: Paulina Fusi M.D.   On: 09/01/2021 16:38      IMPRESSION AND PLAN:  Assessment and Plan: No notes have been filed under this hospital service. Service: Hospitalist      DVT prophylaxis: Lovenox***  Advanced Care Planning:  Code Status: full code***  Family Communication:  The plan of care was discussed in details with the patient (and family). I answered all questions. The patient agreed to proceed with the above mentioned plan. Further management will depend upon  hospital course. Disposition Plan: Back to previous home environment Consults called: none***  All the records are reviewed and case discussed with ED provider.  Status is: Inpatient {Inpatient:23812}   At the time of the admission, it appears that the appropriate admission status for this patient is inpatient.  This is judged to be reasonable and necessary in order to provide the required intensity of service to ensure the patient's safety given the presenting symptoms, physical exam findings and initial radiographic and laboratory data in the context of comorbid conditions.  The patient requires inpatient status due to high intensity of service, high risk of further deterioration and high frequency of surveillance required.  I certify that at the time of admission, it is my clinical judgment that the patient will require inpatient hospital care extending more than 2 midnights.                            Dispo: The patient is from: Home              Anticipated d/c is to: Home              Patient currently is not medically stable to d/c.              Difficult to place patient: No  Hannah Beat M.D on 09/01/2021 at 7:57 PM  [facility]   From 7 PM-7 AM, contact night-coverage [email or web address]  CC: Primary care physician; [name], MD

## 2021-09-01 NOTE — ED Notes (Signed)
Pt taken off O2 at this time. PT denying SHOB at this time.

## 2021-09-01 NOTE — ED Notes (Signed)
Patient placed on 2L O2 

## 2021-09-01 NOTE — ED Notes (Signed)
Pt ambulatory to toilet

## 2021-09-01 NOTE — ED Triage Notes (Signed)
Patient presents with right sided chest pain for a few days. Denies radiation. Hx fluid on lung due to rheumatoid arthritis per mom

## 2021-09-01 NOTE — ED Provider Notes (Signed)
The patient is a 46 year old male with past medical history of rheumatoid arthritis and interstitial lung disease on CellCept, prednisone and Bactrim for PCP prophylaxis and Plaquenil.  Presenting with dyspnea and chest pain.  He is pending a CTA at the time of signout.  CTA shows extensive bibasilar infiltrates which could be infectious versus worsening of his RA.  He also has acute pneumomediastinum without pneumothorax.  Spoke with radiology about how to best evaluate the esophagus this patient is having some dysphagia but no vomiting.  Radiology comments that the esophagus itself looks normal there is no air around it but could get an esophagram versus CT with oral contrast.  Unable to obtain esophagram so obtained a CT with oral contrast which does not show any extravasation esophagus is normal.  Suspect that the mediastinum is related to the lung disease and is spontaneous.  Will cover with cefepime vancomycin and azithromycin and admit to the hospital service.  Discussed with Dr. Cristina Gong.   Georga Hacking, MD 09/01/21 [phone]

## 2021-09-02 DIAGNOSIS — M0579 Rheumatoid arthritis with rheumatoid factor of multiple sites without organ or systems involvement: Secondary | ICD-10-CM | POA: Diagnosis not present

## 2021-09-02 DIAGNOSIS — E1165 Type 2 diabetes mellitus with hyperglycemia: Secondary | ICD-10-CM | POA: Insufficient documentation

## 2021-09-02 DIAGNOSIS — R739 Hyperglycemia, unspecified: Secondary | ICD-10-CM | POA: Diagnosis not present

## 2021-09-02 DIAGNOSIS — T380X5A Adverse effect of glucocorticoids and synthetic analogues, initial encounter: Secondary | ICD-10-CM

## 2021-09-02 DIAGNOSIS — J189 Pneumonia, unspecified organism: Secondary | ICD-10-CM | POA: Diagnosis not present

## 2021-09-02 DIAGNOSIS — R079 Chest pain, unspecified: Secondary | ICD-10-CM

## 2021-09-02 LAB — BASIC METABOLIC PANEL
Anion gap: 6 (ref 5–15)
BUN: 10 mg/dL (ref 6–20)
CO2: 24 mmol/L (ref 22–32)
Calcium: 8.3 mg/dL — ABNORMAL LOW (ref 8.9–10.3)
Chloride: 103 mmol/L (ref 98–111)
Creatinine, Ser: 0.86 mg/dL (ref 0.61–1.24)
GFR, Estimated: >60 mL/min (ref >60)
Glucose, Bld: 261 mg/dL — ABNORMAL HIGH (ref 70–99)
Potassium: 4.7 mmol/L (ref 3.5–5.1)
Sodium: 133 mmol/L — ABNORMAL LOW (ref 135–145)

## 2021-09-02 LAB — COMPREHENSIVE METABOLIC PANEL
ALT: 38 U/L (ref 0–44)
AST: 26 U/L (ref 15–41)
Albumin: 2.6 g/dL — ABNORMAL LOW (ref 3.5–5.0)
Alkaline Phosphatase: 47 U/L (ref 38–126)
Anion gap: 7 (ref 5–15)
BUN: 12 mg/dL (ref 6–20)
CO2: 23 mmol/L (ref 22–32)
Calcium: 8.4 mg/dL — ABNORMAL LOW (ref 8.9–10.3)
Chloride: 98 mmol/L (ref 98–111)
Creatinine, Ser: 0.79 mg/dL (ref 0.61–1.24)
GFR, Estimated: >60 mL/min (ref >60)
Glucose, Bld: 376 mg/dL — ABNORMAL HIGH (ref 70–99)
Potassium: 4.2 mmol/L (ref 3.5–5.1)
Sodium: 128 mmol/L — ABNORMAL LOW (ref 135–145)
Total Bilirubin: 0.6 mg/dL (ref 0.3–1.2)
Total Protein: 6.4 g/dL — ABNORMAL LOW (ref 6.5–8.1)

## 2021-09-02 LAB — CBC
HCT: 36.3 % — ABNORMAL LOW (ref 39.0–52.0)
Hemoglobin: 12.3 g/dL — ABNORMAL LOW (ref 13.0–17.0)
MCH: 31.3 pg (ref 26.0–34.0)
MCHC: 33.9 g/dL (ref 30.0–36.0)
MCV: 92.4 fL (ref 80.0–100.0)
Platelets: 186 10*3/uL (ref 150–400)
RBC: 3.93 MIL/uL — ABNORMAL LOW (ref 4.22–5.81)
RDW: 12.8 % (ref 11.5–15.5)
WBC: 10.3 10*3/uL (ref 4.0–10.5)
nRBC: 0 % (ref 0.0–0.2)

## 2021-09-02 MED ORDER — ENSURE ENLIVE PO LIQD: 237.0000 mL | Freq: Three times a day (TID) | ORAL | Status: DC

## 2021-09-02 MED ORDER — ADALIMUMAB 40 MG/0.4ML ~~LOC~~ AJKT
40.0000 mg | AUTO-INJECTOR | SUBCUTANEOUS | Status: DC
  Administered 2021-09-03: 40 mg via SUBCUTANEOUS
  Filled 2021-09-02: qty 40

## 2021-09-02 MED ORDER — MICAFUNGIN SODIUM 100 MG IV SOLR
150.0000 mg | INTRAVENOUS | Status: DC
  Administered 2021-09-02 – 2021-09-03 (×2): 150 mg via INTRAVENOUS
  Filled 2021-09-02 (×3): qty 7.5

## 2021-09-02 MED ORDER — GUAIFENESIN-CODEINE 100-10 MG/5ML PO SOLN
10.0000 mL | Freq: Four times a day (QID) | ORAL | Status: AC
  Administered 2021-09-02 – 2021-09-04 (×7): 10 mL via ORAL
  Filled 2021-09-02 (×8): qty 10

## 2021-09-02 MED ORDER — MAGIC MOUTHWASH W/LIDOCAINE
5.0000 mL | Freq: Three times a day (TID) | ORAL | Status: AC | PRN
  Administered 2021-09-03 – 2021-09-06 (×6): 5 mL via ORAL
  Filled 2021-09-02 (×7): qty 5

## 2021-09-02 MED ORDER — IPRATROPIUM-ALBUTEROL 0.5-2.5 (3) MG/3ML IN SOLN
3.0000 mL | Freq: Two times a day (BID) | RESPIRATORY_TRACT | Status: DC
  Administered 2021-09-02 – 2021-09-05 (×6): 3 mL via RESPIRATORY_TRACT
  Filled 2021-09-02 (×6): qty 3

## 2021-09-02 MED ORDER — PANTOPRAZOLE SODIUM 40 MG PO TBEC
40.0000 mg | DELAYED_RELEASE_TABLET | Freq: Every day | ORAL | Status: DC
  Administered 2021-09-02: 40 mg via ORAL
  Filled 2021-09-02: qty 1

## 2021-09-02 MED ORDER — INSULIN ASPART 100 UNIT/ML IJ SOLN: 0.0000 [IU] | Freq: Every day | INTRAMUSCULAR | Status: DC | PRN

## 2021-09-02 MED ORDER — ADULT MULTIVITAMIN W/MINERALS CH
1.0000 | ORAL_TABLET | Freq: Every day | ORAL | Status: DC
Start: 2021-09-02 — End: 2021-09-03
  Administered 2021-09-02 – 2021-09-03 (×2): 1 via ORAL
  Filled 2021-09-02 (×2): qty 1

## 2021-09-02 NOTE — Consult Note (Addendum)
PULMONOLOGY         Date: 09/02/2021,   MRN# [id number] Sean Sean [name] Sean Sean [name]     AdmissionWeight: 72.6 kg                 CurrentWeight: 72.6 kg  Referring provider: Dr. Dareen Piano   CHIEF COMPLAINT:   Forceful cough with pneumomediastinum   HISTORY OF PRESENT ILLNESS   This is Sean [name] pleasant patient with Sean [name] history of severe rheumatoid arthritis, seen previously while hospitalized due to bilateral multifocal pneumonia with treatment for community-acquired pneumonia and resolution.  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  Patient has been progressively dyspneic with reduction in spirometry performed in clinic via PFT.  Additional evaluation with rheumatology status post initiation of Humira infusions as well as CellCept and chronic prednisone use at higher dose starting 60 mg with slow taper.  On previous admission he also had moderate pleural effusion with resolution postthoracentesis and clinical improvement patient develops worsening symptoms when tapering below 45 mg daily including scaling of skin fingers and skin redness and erythema around nasolabial folds and worsening dyspnea and stiffness.  He came in on this admission with chest discomfort and severe dysphagia with cough.  He was seen in clinic and noted to have severe oral thrush status post ear nose and throat evaluation with course of Diflucan and partial resolution.  On examination there is still erythema in the upper airway and lower airway as well as remnants of thrush visible in the lower airway.  CT chest independently reviewed with findings of pneumomediastinum without esophageal perforation.  Overall interval improvement in lung parenchyma bilaterally.  Pulmonary critical care consultation placed for additional evaluation management   PAST MEDICAL HISTORY   Past Medical History:  Diagnosis Date    H/O immunosuppressive therapy    Hyperpigmentation of skin    Hyperpigmentation rash/vesicles of the palm and face   Neutropenia (HCC)    RA (rheumatoid arthritis) (HCC)      SURGICAL HISTORY   History reviewed. No pertinent surgical history.   FAMILY HISTORY   Family History  Problem Relation Age of Onset   Hyperlipidemia Mother    Hypertension Father      SOCIAL HISTORY   Social History   Tobacco Use   Smoking status: Former    Types: Cigarettes   Smokeless tobacco: Never  Vaping Use   Vaping Use: Never used  Substance Use Topics   Alcohol use: Not Currently   Drug use: Not Currently    Types: Marijuana     MEDICATIONS    Home Medication:    Current Medication:  Current Facility-Administered Medications:    0.9 %  sodium chloride infusion, , Intravenous, Continuous, Sean Sean [name], Sean Sean [name], Sean Sean [name], Last Rate: 100 mL/hr at 09/02/21 0714, New Bag at 09/02/21 0714   acetaminophen (TYLENOL) tablet 650 mg, 650 mg, Oral, Q6H PRN, 650 mg at 09/01/21 2020 **OR** acetaminophen (TYLENOL) suppository 650 mg, 650 mg, Rectal, Q6H PRN, Sean Sean [name], Sean Sean [name], Sean Sean [name]   [START ON 09/03/2021] Adalimumab PNKT 40 mg, 40 mg, Subcutaneous, Q14 Days, Sean Sean [name], Sean Sean [name]   azithromycin (ZITHROMAX) 500 mg in sodium chloride 0.9 % 250 mL IVPB, 500 mg, Intravenous, Q24H, Sean Sean [name], Sean Sean [name], Sean Sean [name]   ceFEPIme (MAXIPIME) 2 g in sodium chloride 0.9 % 100 mL IVPB, 2 g, Intravenous, Q8H, Sean Sean [name], Sean Sean [name], RPH, Last Rate: 200 mL/hr at 09/02/21 0412, 2 g at 09/02/21  0412   enoxaparin (LOVENOX) injection 40 mg, 40 mg, Subcutaneous, Q24H, Sean Sean [name], Sean Sean [name], Sean Sean [name], 40 mg at 09/01/21 2020   fluticasone furoate-vilanterol (BREO ELLIPTA) 200-25 MCG/ACT 1 puff, 1 puff, Inhalation, Daily, 1 puff at 09/02/21 0904 **AND** umeclidinium bromide (INCRUSE ELLIPTA) 62.5 MCG/ACT 1 puff, 1 puff, Inhalation, Daily, Sean Sean [name], Sean Sean [name], Sean Sean [name], 1 puff at 09/02/21 [id number]   guaiFENesin (MUCINEX) 12 hr tablet 600 mg, 600 mg, Oral, BID, Sean Sean [name], Sean Sean [name], Sean Sean [name], 600 mg at  09/02/21 [id number]   hydroxychloroquine (PLAQUENIL) tablet 200 mg, 200 mg, Oral, BID, Sean Sean [name], Sean Sean [name], Sean Sean [name], 200 mg at 09/02/21 [id number]   ipratropium-albuterol (DUONEB) 0.5-2.5 (3) MG/3ML nebulizer solution 3 mL, 3 mL, Nebulization, Q4H PRN, Sean Sean [name], Sean Sean [name], Sean Sean [name]   ipratropium-albuterol (DUONEB) 0.5-2.5 (3) MG/3ML nebulizer solution 3 mL, 3 mL, Nebulization, BID, Sean Sean [name], Sean Sean [name], Sean Sean [name]   magnesium hydroxide (MILK OF MAGNESIA) suspension 30 mL, 30 mL, Oral, Daily PRN, Sean Sean [name], Sean Sean [name], Sean Sean [name]   micafungin (MYCAMINE) 150 mg in sodium chloride 0.9 % 100 mL IVPB, 150 mg, Intravenous, Q24H, Sean Sean [name], Sean Sean [name]   morphine (PF) 2 MG/ML injection 2 mg, 2 mg, Intravenous, Q4H PRN, Sean Sean [name], Sean Sean [name], Sean Sean [name], 2 mg at 09/02/21 0901   mycophenolate (CELLCEPT) capsule 500 mg, 500 mg, Oral, Q12H, Sean Sean [name], Sean Sean [name], Sean Sean [name], 500 mg at 09/02/21 0859   ondansetron (ZOFRAN) tablet 4 mg, 4 mg, Oral, Q6H PRN **OR** ondansetron (ZOFRAN) injection 4 mg, 4 mg, Intravenous, Q6H PRN, Sean Sean [name], Sean Sean [name], Sean Sean [name]   pneumococcal 20-valent conjugate vaccine (PREVNAR 20) injection 0.5 mL, 0.5 mL, Intramuscular, Tomorrow-1000, Sean Sean [name], Sean Sean [name], Sean Sean [name]   predniSONE (DELTASONE) tablet 30 mg, 30 mg, Oral, Daily, Sean Sean [name], Sean Sean [name], Sean Sean [name], 30 mg at 09/02/21 0859   sucralfate (CARAFATE) 1 GM/10ML suspension 1 g, 1 g, Oral, Once, Sean Sean [name], Sean Sean [name], Sean Sean [name]   sulfamethoxazole-trimethoprim (BACTRIM) 400-80 MG per tablet 1 tablet, 1 tablet, Oral, Once per day on Mon Wed Fri, Sean Sean [name], Sean Sean [name], Sean Sean [name], 1 tablet at 09/02/21 0900   traZODone (DESYREL) tablet 25 mg, 25 mg, Oral, QHS PRN, Sean Sean [name], Sean Sean [name]   Sean Sean [name] (Ergocalciferol) (DRISDOL) capsule 50,000 Units, 50,000 Units, Oral, Weekly, Sean Sean [name], Sean Sean [name], Sean Sean [name], 50,000 Units at 09/02/21 0900    ALLERGIES   Patient has no known allergies.     REVIEW OF SYSTEMS    Review of Systems:  Gen:  Denies  fever, sweats, chills weigh loss  HEENT: Denies blurred vision, double vision, ear pain, eye pain, hearing loss, nose bleeds, sore throat Cardiac:  No dizziness, chest pain or  heaviness, chest tightness,edema Resp:   reports dyspnea chronically  Gi: Denies swallowing difficulty, stomach pain, nausea or vomiting, diarrhea, constipation, bowel incontinence Gu:  Denies bladder incontinence, burning urine Ext:   Denies Joint pain, stiffness or swelling Skin: Denies  skin rash, easy bruising or bleeding or hives Endoc:  Denies polyuria, polydipsia , polyphagia or weight change Psych:   Denies depression, insomnia or hallucinations   Other:  All other systems negative   VS: BP (!) 100/56 (BP Location: Right Arm)   Pulse 96   Temp 98.1 F (36.7 C)   Resp 18   Ht 6\' 5"  (1.956 m)   Wt 72.6 kg   SpO2 97%   BMI 18.97 kg/m      PHYSICAL EXAM    GENERAL:NAD, no fevers, chills, no weakness no fatigue HEAD: Normocephalic, atraumatic.  EYES: Pupils equal, round, reactive to light. Extraocular muscles intact. No scleral icterus.  MOUTH: Moist mucosal membrane. Dentition intact. No  abscess noted.  EAR, NOSE, THROAT: Clear without exudates. No external lesions.  NECK: Supple. No thyromegaly. No nodules. No JVD.  PULMONARY: decreased breath sounds with mild rhonchi worse at bases bilaterally.  CARDIOVASCULAR: S1 and S2. Regular rate and rhythm. No murmurs, rubs, or gallops. No edema. Pedal pulses 2+ bilaterally.  GASTROINTESTINAL: Soft, nontender, nondistended. No masses. Positive bowel sounds. No hepatosplenomegaly.  MUSCULOSKELETAL: No swelling, clubbing, or edema. Range of motion full in all extremities.  NEUROLOGIC: Cranial nerves II through XII are intact. No gross focal neurological deficits. Sensation intact. Reflexes intact.  SKIN: No ulceration, lesions, rashes, or cyanosis. Skin warm and dry. Turgor intact.  PSYCHIATRIC: Mood, affect within normal limits. The patient is awake, alert and oriented x 3. Insight, judgment intact.       IMAGING     ASSESSMENT/PLAN   Subacute chest discomfort and severe dysphagia Secondary to forceful cough likely due  to recurrent candidiasis with thrush -Failure of Diflucan p.o. and outpatient status post ENT evaluation -Have initiated micafungin IV for now -Supportive care for pneumomediastinum including reduction of shearing forces, avoid incentive spirometry, BiPAP/CPAP, flutter valve -Agree with empiric cefepime and Zithromax until microbiology returns -GI consultation-have discussed with Dr. 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     Thank you for allowing me to participate in the care of this patient.   Patient/Family are satisfied with care plan and all questions have been answered.    Provider disclosure: Patient with at least one acute or chronic illness or injury that poses Sean [name] threat to life or bodily function and is being managed actively during this encounter.  All of the below services have been performed independently by signing provider:  review of prior documentation from internal and or external health records.  Review of previous and current lab results.  Interview and comprehensive assessment during patient visit today. Review of current and previous chest radiographs/CT scans. Discussion of management and test interpretation with health care team and patient/family.   This document was prepared using Dragon voice recognition software and Sean include unintentional dictation errors.     Vida Rigger, M.Sean [name].   Division of Pulmonary & Critical Care Medicine

## 2021-09-02 NOTE — Progress Notes (Signed)
Inpatient Diabetes Program Recommendations  AACE/ADA: New Consensus Statement on Inpatient Glycemic Control (2015)  Target Ranges:  Prepandial:   less than 140 mg/dL      Peak postprandial:   less than 180 mg/dL (1-2 hours)      Critically ill patients:  140 - 180 mg/dL   Lab Results  Component Value Date   GLUCAP 210 (H) 08/20/2021    Review of Glycemic Control  Latest Reference Range & Units 09/01/21 12:27 09/02/21 06:12 09/02/21 13:18  Glucose 70 - 99 mg/dL 827 (H) 078 (H) 675 (H)    Current orders for Inpatient glycemic control:  Novolog 0-9 units tid prn (will not be scheduled for RN to be prompted to give)  Inpatient Diabetes Program Recommendations:    - schedule Novolog Correction 0-15 units tid and hs scale (ordered as prn) -  Add Novolog 3 units tid meal coverage if eating>50% of meals.  Thanks,  Christena Deem RN, MSN, BC-ADM Inpatient Diabetes Coordinator Team Pager [phone] (8a-5p)

## 2021-09-02 NOTE — Assessment & Plan Note (Addendum)
CT chest concerning for multifocal pneumonia.  He also have history of rheumatoid arthritis with pulmonary involvement. He is on multiple immunosuppressants for rheumatoid arthritis.  He is also at risk for aspiration pneumonia due to dysphagia, dysphagia and recent candidal infection.  Surprisingly, he has not had any fever.  Tmax was 100.1 on arrival.  He has no leukocytosis either.  He is on room air. -Vanco, cefepime and Zithromax 5/23-5/27>> Levaquin 5/30-5/31. -On IV Unasyn mainly for posterior pharyngeal perforation

## 2021-09-02 NOTE — Progress Notes (Signed)
Initial Nutrition Assessment  DOCUMENTATION CODES:   Severe malnutrition in context of chronic illness  INTERVENTION:  - Liberalize diet from a heart healthy to a regular diet to provide widest variety of menu options to enhance nutritional adequacy  - Ensure Enlive po BID, each supplement provides 350 kcal and 20 grams of protein.  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  GOAL:   Patient will meet greater than or equal to 90% of their needs  MONITOR:   PO intake, Supplement acceptance, Diet advancement, Labs, Weight trends  REASON FOR ASSESSMENT:   Malnutrition Screening Tool    ASSESSMENT:   Pt admitted with chest pain d/t bilateral PNA. 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  Per review of chart, CT chest shows no evidence of esophageal perforation. GI following for dysphagia and odynophagia, which is suspected to be attributed to pneumoperitoneum. Noted plans for EGD tomorrow.   Pt endorses swallowing difficulty within the last couple months associated with pain in his throat. He reports that he was eating well until this past week when he began having chest pain which is worsened with consumption of food and fluids. Prior to this last week, he was eating 4 meals per day consisting of eggs and grits for breakfast, and for the remaining meals he may have a burger, pasta, or spaghetti. This morning he ordered an omelette but was unable to eat it d/t chest pain.   Meal completions: 05/24: 50%-breakfast, 30%-lunch  Pt reports a usual weight of 208 lbs and endorses weight loss since October with most recent known weight of 160 lbs. Reviewed weight history. Pt noted to have had a 19% weight loss within the last 4 months which is  clinically significant for time frame.   Medications: plaquenil, cellcept, prednisone, sucralfate, Vitamin D, IV abx  Labs: sodium 133, calcium 8.3, CBG's 210  NUTRITION - FOCUSED PHYSICAL EXAM:  Flowsheet Row Most Recent Value  Orbital Region Mild depletion  Upper Arm Region Severe depletion  Thoracic and Lumbar Region Severe depletion  Buccal Region Moderate depletion  Temple Region Moderate depletion  Clavicle Bone Region Moderate depletion  Clavicle and Acromion Bone Region Moderate depletion  Scapular Bone Region Severe depletion  Dorsal Hand No depletion  Patellar Region Mild depletion  Anterior Thigh Region Mild depletion  Posterior Calf Region No depletion  Edema (RD Assessment) None  Hair Reviewed  Eyes Reviewed  Mouth Reviewed  Skin Reviewed  Nails Reviewed       Diet Order:   Diet Order             Diet regular Room service appropriate? Yes; Fluid consistency: Thin  Diet effective now                   EDUCATION NEEDS:   Education needs have been addressed  Skin:  Skin Assessment: Reviewed RN Assessment  Last BM:  5/22  Height:   Ht Readings from Last 1 Encounters:  09/01/21 6\' 5"  (1.956 m)    Weight:   Wt Readings from Last 1 Encounters:  09/01/21 72.6 kg   BMI:  Body mass index is 18.97 kg/m.  Estimated Nutritional Needs:   Kcal:  2300-2500  Protein:  100-115g  Fluid:  >/=2.3L  Clayborne Dana, RDN, LDN Clinical Nutrition

## 2021-09-02 NOTE — Assessment & Plan Note (Addendum)
On methotrexate, prednisone, CellCept and Plaquenil at home. -On IV Solu-Medrol per pulmonology.

## 2021-09-02 NOTE — Progress Notes (Signed)
PROGRESS NOTE  Sean [name]    DOB: 12/11/1975, 46 y.o.  [id number]    Code Status: Full Code   DOA: 09/01/2021   LOS: 1   Brief hospital course  Sean [name] is a 46 y.o. 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  They presented from home to the ED on 09/01/2021 with dyspnea and dysphagia/odynophagia, cough x several days worsening.  Patient recently treated with Diflucan for esophageal candidiasis infection.  In the ED, it was found that they had temperature as high as 100.1, tachycardia up to 105, tachypnea up to 23, O2 sats 88% on room air, stable blood pressure.  Significant findings included blood glucose of 250 with calcium of 8.7 and ALT of 52 with albumin of 3 and total protein of 7.1.  High-sensitivity troponin was 9 and later 11.  Procalcitonin was less than 0.1 and CBC was unremarkable. Chest CTA revealed no evidence for PE.  Showed acute pneumomediastinum with extension to lower neck with no pneumonia no thorax/pleural air.  It showed small effusions less than previously seen.  It showed bilateral lower lobe volume loss/pneumonia with areas of cystic changes.  Findings could be due to infectious pneumonia and or rheumatoid  lung disease.  Chest CT without contrast revealed extensive pneumomediastinum and contrast-filled esophagus was patent with no extraluminal contrast extravasation.  It showed multiple focal and confluent infiltrates that are scattered throughout the lungs suggestive of multifocal pneumonia with small bilateral pleural effusions. Pulmonology was consulted.  They were initially treated with cefepime, vancomycin, Zithromax.   Patient was admitted to medicine service for further workup and management of pneumonia as outlined in detail below.  Thoracentesis 4/16  09/02/21 -stable, improved  Assessment & Plan  Principal Problem:    Bilateral pneumonia Active Problems:   Rheumatoid arthritis involving multiple sites with positive rheumatoid factor (HCC)   Steroid-induced hyperglycemia  Bilateral pneumonia- stable on room air. -Pulmonology consulted.  Providers familiar with the patient's medical history.  Appreciate recommendations and care -Continue cefepime, Zithromax -Antitussive's as needed -F/U cultures   Interstitial lung disease  rheumatoid arthritis involving multiple sites with positive rheumatoid factor (HCC) -Continue Humira, CellCept, prednisone taper -Pulmonology has discontinued Plaquenil due to side effect profile -Low threshold to re-image chest if worsening pain or respirations due to concern of expanding pneumo mediastinum  Odynophagia  dysphagia- thought to be related to Candida esophagitis in setting of immune suppression.  Patient's biggest complaint is the discomfort with swallowing and currently denies dyspnea.  He states that prior to admission he was taking 800 mg ibuprofen twice daily for several days.  The morphine given and patient helped somewhat. -Analgesia as needed -GI consulted, appreciate recommendations  -EGD 5/25 - PPI -Micafungin IV for refractory candidal infection suspected   Steroid-induced hyperglycemia - We will continue monitoring blood glucose measures with supplement coverage with NovoLog.   Body mass index is 18.97 kg/m.  VTE ppx: enoxaparin (LOVENOX) injection 40 mg Start: 09/01/21 2200   Diet:     Diet   Diet Heart Room service appropriate? Yes; Fluid consistency: Thin   Consultants: Pulmonology GI Subjective 09/02/21    Pt reports no dyspnea or shortness of breath this morning.  He complains of stable throat soreness which is limiting his p.o. intake.  The pain extends down into his chest and feels as though when he swallows food is getting stuck in his throat.  Objective   Vitals:   09/01/21 2320 09/02/21 0400 09/02/21 0405 09/02/21 0736  BP:  115/64  110/72 112/90  Pulse: (!) 104  94 (!) 104  Resp: Temp: 98.9 F (37.2 C)  99.7 F (37.6 C) 98.1 F (36.7 C)  TempSrc: Oral  Oral   SpO2: 97%  96% 93%  Weight:      Height:       No intake or output data in the 24 hours ending 09/02/21 0754 Filed Weights   09/01/21 1223  Weight: 72.6 kg     Physical Exam:  General: awake, alert, NAD HEENT: atraumatic, clear conjunctiva, anicteric sclera, MMM, hearing grossly normal.  Pharynx erythematous Respiratory: normal respiratory effort. CTAB.  Pain in lower right ribs with deep inhalation.  Tender to palpation. Cardiovascular: quick capillary refill Gastrointestinal: soft, NT, ND Nervous: A&O x3. no gross focal neurologic deficits, normal speech Extremities: moves all equally, no edema, normal tone Skin: dry, intact, normal temperature, normal color. No rashes, lesions or ulcers on exposed skin Psychiatry: normal mood, congruent affect  Labs   I have personally reviewed the following labs and imaging studies CBC    Component Value Date/Time   WBC 10.3 09/02/2021 0612   RBC 3.93 (L) 09/02/2021 0612   HGB 12.3 (L) 09/02/2021 0612   HGB 13.2 07/26/2021 0905   HCT 36.3 (L) 09/02/2021 0612   HCT 39.2 07/26/2021 0905   PLT 186 09/02/2021 0612   PLT 149 (L) 07/26/2021 0905   MCV 92.4 09/02/2021 0612   MCV 92 07/26/2021 0905   MCH 31.3 09/02/2021 0612   MCHC 33.9 09/02/2021 0612   RDW 12.8 09/02/2021 0612   RDW 12.9 07/26/2021 0905   LYMPHSABS 0.8 07/26/2021 0905   MONOABS 0.4 07/25/2021 1608   EOSABS 0.1 07/26/2021 0905   BASOSABS 0.0 07/26/2021 0905      Latest Ref Rng & Units 09/02/2021    6:12 AM 09/01/2021   12:27 PM 07/25/2021    4:08 PM  BMP  Glucose 70 - 99 mg/dL 161   096   045    BUN 6 - 20 mg/dL Creatinine 0.61 - 1.24 mg/dL 4.09   8.11   9.14    Sodium 135 - 145 mmol/L 133   135   134    Potassium 3.5 - 5.1 mmol/L 4.7   3.7   3.9    Chloride 98 - 111 mmol/L 103   104   102     CO2 22 - 32 mmol/L Calcium 8.9 - 10.3 mg/dL 8.3   8.7   8.6      DG Chest 2 View  Result Date: 09/01/2021 CLINICAL DATA:  Chest pain. EXAM: CHEST - 2 VIEW COMPARISON:  July 26, 2021.  Aug 20, 2021. FINDINGS: The heart size and mediastinal contours are within normal limits. Stable bibasilar atelectasis or infiltrates are noted with associated pleural effusions. The visualized skeletal structures are unremarkable. IMPRESSION: Stable bibasilar atelectasis or infiltrates are noted with associated pleural effusions. Electronically Signed   By: Lupita Raider M.D.   On: 09/01/2021 12:59   CT CHEST WO CONTRAST  Result Date: 09/01/2021 CLINICAL DATA:  Esophageal perforation with pneumo mediastinum. Right-sided chest pain for a few days. EXAM: CT CHEST WITHOUT CONTRAST TECHNIQUE: Multidetector CT imaging of the chest was performed following the standard protocol without IV contrast.  RADIATION DOSE REDUCTION: This exam was performed according to the departmental dose-optimization program which includes automated exposure control, adjustment of the mA and/or kV according to patient size and/or use of iterative reconstruction technique. COMPARISON:  CT angiography of the chest 09/01/2021 FINDINGS: Cardiovascular: Mild cardiac enlargement. No pericardial effusions. Normal caliber thoracic aorta. Mediastinum/Nodes: Extensive pneumomediastinum is again demonstrated throughout the mediastinum and extending into the base of the neck and supraclavicular regions. Contrast material fills the esophagus. The esophagus is patent to the stomach without any definite contrast extravasation. No significant lymphadenopathy. Lungs/Pleura: Emphysematous changes in the lungs. Coarse multifocal and confluent infiltrates scattered throughout the lungs with consolidation in the lung bases. This may represent multifocal pneumonia. Small bilateral pleural effusions. Airways are patent. Upper Abdomen: No acute  abnormalities demonstrated. Musculoskeletal: No chest wall mass or suspicious bone lesions identified. IMPRESSION: 1. Extensive pneumomediastinum as previously demonstrated. Contrast filled esophagus is patent and no extraluminal contrast extravasation is identified. 2. Multiple focal and confluent infiltrates are scattered throughout the lungs, suggesting multifocal pneumonia. Small bilateral pleural effusions. Electronically Signed   By: Burman Nieves M.D.   On: 09/01/2021 18:31   CT Angio Chest PE W and/or Wo Contrast  Result Date: 09/01/2021 CLINICAL DATA:  Pulmonary embolism suspected. High probability. Right-sided chest pain. Rheumatoid arthritis. EXAM: CT ANGIOGRAPHY CHEST WITH CONTRAST TECHNIQUE: Multidetector CT imaging of the chest was performed using the standard protocol during bolus administration of intravenous contrast. Multiplanar CT image reconstructions and MIPs were obtained to evaluate the vascular anatomy. RADIATION DOSE REDUCTION: This exam was performed according to the departmental dose-optimization program which includes automated exposure control, adjustment of the mA and/or kV according to patient size and/or use of iterative reconstruction technique. CONTRAST:  50mL OMNIPAQUE IOHEXOL 350 MG/ML SOLN COMPARISON:  Chest radiography same day. Prior CT angiography 07/25/2021. FINDINGS: Cardiovascular: Heart size upper limits of normal. No coronary artery calcification or aortic atherosclerotic calcification. Pulmonary arterial opacification is good. There are no pulmonary emboli. Mediastinum/Nodes: There is pneumomediastinum, extending into the lower neck. No evidence of mass. Few small mediastinal nodes as seen previously. Lungs/Pleura: Small pleural effusions layering dependently. Bilateral lower lobe atelectasis/pneumonia with areas of cystic change. This could be due to infectious pneumonia or rheumatoid lung disease. There is no pneumothorax/pleural air. Upper Abdomen: Negative  Musculoskeletal: No acute spinal finding. Review of the MIP images confirms the above findings. IMPRESSION: No pulmonary emboli. Acute pneumomediastinum, with extension into the lower neck. No pneumothorax/pleural air. Small effusions, less than were seen previously. Bilateral lower lobe volume loss/pneumonia with areas of cystic change. Findings could be due to infectious pneumonia and or rheumatoid lung disease. Electronically Signed   By: Paulina Fusi M.D.   On: 09/01/2021 16:38    Disposition Plan & Communication  Patient status: Inpatient  Admitted From: Home Planned disposition location: Home Anticipated discharge date: 5/26 pending further GI work-up and treatment of candidal pharyngitis  Family Communication: Mother and father at bedside   Author: Leeroy Bock, DO [facility] 09/02/2021, 7:54 AM   Available by Epic secure chat 7AM-7PM. If 7PM-7AM, please contact night-coverage.  [facility] contact information found on [email or web address].

## 2021-09-02 NOTE — Assessment & Plan Note (Addendum)
A1c 11.9%.  No history of diabetes.  Likely due to chronic steroid use. Recent Labs  Lab 09/12/21 1952 09/13/21 0022 09/13/21 0437 09/13/21 0817 09/13/21 1131  GLUCAP 171* 117* 85 133* 256*  -Change SSI to moderate every 6 hours. -Check C-peptide -Consult diabetic coordinator -Needs insulin on discharge.

## 2021-09-02 NOTE — Consult Note (Signed)
Consultation  Referring Provider:     Dr Karna Christmas Admit date 09/01/21 Consult date    09/02/21     Reason for Consultation:     dysphagia         HPI:   Sean [name] is a 46 y.o. 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 Had ID eval last month while hospitalized for sob/pulmonary effusion at which time he underwent thoracentesis. Presented yesterday to ED with dyspnea/dyshagia/cough and admitted with pneumomediastnum/mutlifocal pneumonia. He has been given IV cefepime, vancomycin and Zithromax. He has been cosulted by pumonology, and GI has been requested for assistance with dysphagia. Patient reports his breathing improved after the thoracentesis. Dysphagia started about a week ago- at that time he also noted some increase in cough and soreness in the chest. There has been some odynophagia at the initiation of the swallow and it feels like the food gets hung in the mid chest and then passes quickly. This does not last long. He endorses frequent Ibuprofen use over the last month or so. There has been mild reflux at times. Denies abdominal pain, melena, NV, rectal bleeding and all other GI concerns. He denies any problems with sedated procedures. He reports some mild sob at rest but that his breathing is stable to improved since admission. Note he is also being treated for thrush. CT chest demonstrated a patent esophagus and extensive pneumomediastinum but improvement to his lungs. He is receiving supportive care for the pneumomediastinum. Denies any family history of GI malignancy. Labs as below.  PREVIOUS ENDOSCOPIES:            none   Past Medical History:  Diagnosis Date   H/O immunosuppressive therapy    Hyperpigmentation of skin    Hyperpigmentation  rash/vesicles of the palm and face   Neutropenia (HCC)    RA (rheumatoid arthritis) (HCC)     History reviewed. No pertinent surgical history.  Family History  Problem Relation Age of Onset   Hyperlipidemia Mother    Hypertension Father      Social History   Tobacco Use   Smoking status: Former    Types: Cigarettes   Smokeless tobacco: Never  Vaping Use   Vaping Use: Never used  Substance Use Topics   Alcohol use: Not Currently   Drug use: Not Currently    Types: Marijuana    Prior to Admission medications   Medication Sig Start Date End Date Taking? Authorizing Provider  BREZTRI AEROSPHERE 160-9-4.8 MCG/ACT AERO Inhale 2 puffs into the lungs 2 (two) times daily. 08/27/21  Yes [provider]  hydroxychloroquine (PLAQUENIL) 200 MG tablet Take 1 tablet by mouth 2 (two) times daily. 03/26/21  Yes [provider]  mycophenolate (CELLCEPT) 500 MG tablet Take 1 tablet by mouth every 12 (twelve) hours. 08/27/21  Yes [provider]  predniSONE (DELTASONE) 10 MG tablet 6 tabs (60 mg)po daily for one week; then taper  every week until down to 10 mg daily 07/27/21  Yes Wieting, Richard, MD  sulfamethoxazole-trimethoprim (BACTRIM) 400-80 MG tablet Take 1 tablet by mouth 2 (two) times daily. Patient taking differently: Take 1 tablet by mouth 3 (three) times a week. 07/27/21  Yes Wieting, Richard, MD  folic acid (FOLVITE) 1 MG tablet Take 1 mg by mouth daily. Patient not taking: Reported on 09/01/2021  07/09/21   [provider]  methotrexate 2.5 MG tablet Take 5 tablets by mouth every 7 (seven) days. Patient not taking: Reported on 09/01/2021 07/09/21   [provider]  Vitamin D, Ergocalciferol, (DRISDOL) 1.25 MG (50000 UNIT) CAPS capsule Take 50,000 Units by mouth once a week. 07/06/21   [provider]  witch hazel-glycerin (TUCKS) pad Apply topically as needed for hemorrhoids. Patient not taking: Reported on 09/01/2021 07/27/21   Alford Highland, MD    Current Facility-Administered Medications  Medication Dose Route Frequency Provider Last Rate Last Admin   acetaminophen (TYLENOL) tablet 650 mg  650 mg Oral Q6H PRN Mansy, Jan A, MD   650 mg at 09/01/21 2020   Or   acetaminophen (TYLENOL) suppository 650 mg  650 mg Rectal Q6H PRN Mansy, Vernetta Honey, MD       [START ON 09/03/2021] Adalimumab PNKT 40 mg  40 mg Subcutaneous Q14 Days Leeroy Bock, MD       azithromycin (ZITHROMAX) 500 mg in sodium chloride 0.9 % 250 mL IVPB  500 mg Intravenous Q24H Mansy, Jan A, MD       ceFEPIme (MAXIPIME) 2 g in sodium chloride 0.9 % 100 mL IVPB  2 g Intravenous Q8H Beers, Apolinar Junes D, RPH 200 mL/hr at 09/02/21 0412 2 g at 09/02/21 0412   enoxaparin (LOVENOX) injection 40 mg  40 mg Subcutaneous Q24H Mansy, Jan A, MD   40 mg at 09/01/21 2020   fluticasone furoate-vilanterol (BREO ELLIPTA) 200-25 MCG/ACT 1 puff  1 puff Inhalation Daily Mansy, Jan A, MD   1 puff at 09/02/21 1610   And   umeclidinium bromide (INCRUSE ELLIPTA) 62.5 MCG/ACT 1 puff  1 puff Inhalation Daily Mansy, Jan A, MD   1 puff at 09/02/21 0904   guaiFENesin-codeine 100-10 MG/5ML solution 10 mL  10 mL Oral QID Vida Rigger, MD       ipratropium-albuterol (DUONEB) 0.5-2.5 (3) MG/3ML nebulizer solution 3 mL  3 mL Nebulization Q4H PRN Mansy, Jan A, MD       ipratropium-albuterol (DUONEB) 0.5-2.5 (3) MG/3ML nebulizer solution 3 mL  3 mL Nebulization BID Leeroy Bock, MD       magnesium hydroxide (MILK OF MAGNESIA) suspension 30 mL  30 mL Oral Daily PRN Mansy, Jan A, MD       micafungin (MYCAMINE) 150 mg in sodium chloride 0.9 % 100 mL IVPB  150 mg Intravenous Q24H Vida Rigger, MD       morphine (PF) 2 MG/ML injection 2 mg  2 mg Intravenous Q4H PRN Mansy, Jan A, MD   2 mg at 09/02/21 0901   mycophenolate (CELLCEPT) capsule 500 mg  500 mg Oral Q12H Mansy, Jan A, MD   500 mg at 09/02/21 0859   ondansetron (ZOFRAN) tablet 4 mg  4 mg Oral Q6H PRN Mansy, Jan A, MD       Or    ondansetron [facility]) injection 4 mg  4 mg Intravenous Q6H PRN Mansy, Jan A, MD       pneumococcal 20-valent conjugate vaccine (PREVNAR 20) injection 0.5 mL  0.5 mL Intramuscular Tomorrow-1000 Mansy, Jan A, MD       predniSONE (DELTASONE) tablet 30 mg  30 mg Oral Daily Mansy, Jan A, MD   30 mg at 09/02/21 0859   sucralfate (CARAFATE) 1 GM/10ML suspension 1 g  1 g Oral Once Shaune Pollack, MD       sulfamethoxazole-trimethoprim (BACTRIM) 400-80 MG per tablet 1 tablet  1  tablet Oral Once per day on Mon Wed Fri Mansy, Jan A, MD   1 tablet at 09/02/21 0900   traZODone (DESYREL) tablet 25 mg  25 mg Oral QHS PRN Mansy, Vernetta Honey, MD       Vitamin D (Ergocalciferol) (DRISDOL) capsule 50,000 Units  50,000 Units Oral Weekly Mansy, Jan A, MD   50,000 Units at 09/02/21 0900    Allergies as of 09/01/2021   (No Known Allergies)     Review of Systems:    All systems reviewed and negative except where noted in HPI.  Gen: Denies any fever, chills, sweats, anorexia, fatigue, weakness, malaise, weight loss, and sleep disorder CV: Denies chest pain, angina, palpitations, syncope, orthopnea, PND, peripheral edema, and claudication. Resp: Denies dyspnea at rest, dyspnea with exercise, cough, sputum, wheezing, coughing up blood, and pleurisy. GI: Denies vomiting blood, jaundice, and fecal incontinence.   Denies dysphagia or odynophagia. GU : Denies urinary burning, blood in urine, urinary frequency, urinary hesitancy, nocturnal urination, and urinary incontinence. MS: Denies joint pain, limitation of movement, and swelling, stiffness, low back pain, extremity pain. Denies muscle weakness, cramps, atrophy.  Derm: Denies rash, itching, dry skin, hives, moles, warts, or unhealing ulcers.  Psych: Denies depression, anxiety, memory loss, suicidal ideation, hallucinations, paranoia, and confusion. Heme: Denies bruising, bleeding, and enlarged lymph nodes. Neuro:  Denies any headaches, dizziness, paresthesias. Endo:   Denies any problems with DM, thyroid, adrenal function.    Physical Exam:  Vital signs in last 24 hours: Temp:  [98.1 F (36.7 C)-100.1 F (37.8 C)] 98.1 F (36.7 C) (05/24 1155) Pulse Rate:  [94-105] 96 (05/24 1155) Resp:  [15-23] 18 (05/24 1155) BP: (100-147)/(56-119) 100/56 (05/24 1155) SpO2:  [93 %-98 %] 97 % (05/24 1155) Last BM Date : 08/31/21 General:   Pleasant  in NAD Head:  Normocephalic and atraumatic. Eyes:   No icterus.   Conjunctiva pink. Ears:  Normal auditory acuity. Mouth: Mucosa pink moist, no lesions. Neck:  Supple; no masses felt Lungs:  Respirations even and unlabored. Mild tachypnea. Lungs clear to auscultation in the upper lobes, bilateral crackles present at bases.  No wheezes, or rhonchi. I do not appreciate any subq emphysema to chest or neck Heart:  S1S2, RRR, no MRG. No edema. Abdomen:   Flat, soft, nondistended, nontender. Normal bowel sounds. No appreciable masses or hepatomegaly. No rebound signs or other peritoneal signs. Rectal:  Not performed.  Msk:  MAEW x4, No clubbing or cyanosis. Strength 5/5. Symmetrical without gross deformities. Neurologic:  Alert and  oriented x4;  Cranial nerves II-XII intact.  Skin:  Warm, dry, pink without significant lesions or rashes. Psych:  Alert and cooperative. Normal affect.  LAB RESULTS: Recent Labs    09/01/21 1227 09/02/21 0612  WBC 6.2 10.3  HGB 13.5 12.3*  HCT 40.1 36.3*  PLT 197 186   BMET Recent Labs    09/01/21 1227 09/02/21 0612  NA 135 133*  K 3.7 4.7  CL 104 103  CO2 24 24  GLUCOSE 250* 261*  BUN 12 10  CREATININE 0.90 0.86  CALCIUM 8.7* 8.3*   LFT Recent Labs    09/01/21 1227  PROT 7.1  ALBUMIN 3.0*  AST 33  ALT 52*  ALKPHOS 63  BILITOT 0.8  BILIDIR 0.1  IBILI 0.7   PT/INR No results for input(s): LABPROT, INR in the last 72 hours.  STUDIES: DG Chest 2 View  Result Date: 09/01/2021 CLINICAL DATA:  Chest pain. EXAM: CHEST - 2 VIEW COMPARISON:  July 26, 2021.  Aug 20, 2021. FINDINGS: The heart size and mediastinal contours are within normal limits. Stable bibasilar atelectasis or infiltrates are noted with associated pleural effusions. The visualized skeletal structures are unremarkable. IMPRESSION: Stable bibasilar atelectasis or infiltrates are noted with associated pleural effusions. Electronically Signed   By: Lupita Raider M.D.   On: 09/01/2021 12:59   CT CHEST WO CONTRAST  Result Date: 09/01/2021 CLINICAL DATA:  Esophageal perforation with pneumo mediastinum. Right-sided chest pain for a few days. EXAM: CT CHEST WITHOUT CONTRAST TECHNIQUE: Multidetector CT imaging of the chest was performed following the standard protocol without IV contrast. RADIATION DOSE REDUCTION: This exam was performed according to the departmental dose-optimization program which includes automated exposure control, adjustment of the mA and/or kV according to patient size and/or use of iterative reconstruction technique. COMPARISON:  CT angiography of the chest 09/01/2021 FINDINGS: Cardiovascular: Mild cardiac enlargement. No pericardial effusions. Normal caliber thoracic aorta. Mediastinum/Nodes: Extensive pneumomediastinum is again demonstrated throughout the mediastinum and extending into the base of the neck and supraclavicular regions. Contrast material fills the esophagus. The esophagus is patent to the stomach without any definite contrast extravasation. No significant lymphadenopathy. Lungs/Pleura: Emphysematous changes in the lungs. Coarse multifocal and confluent infiltrates scattered throughout the lungs with consolidation in the lung bases. This may represent multifocal pneumonia. Small bilateral pleural effusions. Airways are patent. Upper Abdomen: No acute abnormalities demonstrated. Musculoskeletal: No chest wall mass or suspicious bone lesions identified. IMPRESSION: 1. Extensive pneumomediastinum as previously demonstrated. Contrast filled esophagus is patent and no  extraluminal contrast extravasation is identified. 2. Multiple focal and confluent infiltrates are scattered throughout the lungs, suggesting multifocal pneumonia. Small bilateral pleural effusions. Electronically Signed   By: Burman Nieves M.D.   On: 09/01/2021 18:31   CT Angio Chest PE W and/or Wo Contrast  Result Date: 09/01/2021 CLINICAL DATA:  Pulmonary embolism suspected. High probability. Right-sided chest pain. Rheumatoid arthritis. EXAM: CT ANGIOGRAPHY CHEST WITH CONTRAST TECHNIQUE: Multidetector CT imaging of the chest was performed using the standard protocol during bolus administration of intravenous contrast. Multiplanar CT image reconstructions and MIPs were obtained to evaluate the vascular anatomy. RADIATION DOSE REDUCTION: This exam was performed according to the departmental dose-optimization program which includes automated exposure control, adjustment of the mA and/or kV according to patient size and/or use of iterative reconstruction technique. CONTRAST:  50mL OMNIPAQUE IOHEXOL 350 MG/ML SOLN COMPARISON:  Chest radiography same day. Prior CT angiography 07/25/2021. FINDINGS: Cardiovascular: Heart size upper limits of normal. No coronary artery calcification or aortic atherosclerotic calcification. Pulmonary arterial opacification is good. There are no pulmonary emboli. Mediastinum/Nodes: There is pneumomediastinum, extending into the lower neck. No evidence of mass. Few small mediastinal nodes as seen previously. Lungs/Pleura: Small pleural effusions layering dependently. Bilateral lower lobe atelectasis/pneumonia with areas of cystic change. This could be due to infectious pneumonia or rheumatoid lung disease. There is no pneumothorax/pleural air. Upper Abdomen: Negative Musculoskeletal: No acute spinal finding. Review of the MIP images confirms the above findings. IMPRESSION: No pulmonary emboli. Acute pneumomediastinum, with extension into the lower neck. No pneumothorax/pleural air.  Small effusions, less than were seen previously. Bilateral lower lobe volume loss/pneumonia with areas of cystic change. Findings could be due to infectious pneumonia and or rheumatoid lung disease. Electronically Signed   By: Paulina Fusi M.D.   On: 09/01/2021 16:38       Impression / Plan:   Dysphagia- history of recent frequent nsaids- patient was seen with Dr Mia Creek- planning for EGD  tomorrow for luminal evaluation as clinically feasible. He is agreeable to this. My benefit from PPI pending results if there is esophagitis.  Thank you very much for this consult. These services were provided by Vevelyn Pat, NP-C, in collaboration with Regis Bill, MD, with whom I have discussed this patient in full.   Vevelyn Pat, NP-C

## 2021-09-03 ENCOUNTER — Inpatient Hospital Stay: Payer: 59

## 2021-09-03 ENCOUNTER — Inpatient Hospital Stay: Payer: 59 | Admitting: Anesthesiology

## 2021-09-03 ENCOUNTER — Encounter
Admission: EM | Disposition: A | Discharge status: Home Health | Payer: Self-pay | Source: Home / Self Care | Attending: Student in an Organized Health Care Education/Training Program

## 2021-09-03 ENCOUNTER — Encounter: Payer: Self-pay | Admitting: Family Medicine

## 2021-09-03 DIAGNOSIS — R079 Chest pain, unspecified: Secondary | ICD-10-CM | POA: Diagnosis not present

## 2021-09-03 DIAGNOSIS — T380X5A Adverse effect of glucocorticoids and synthetic analogues, initial encounter: Secondary | ICD-10-CM | POA: Diagnosis not present

## 2021-09-03 DIAGNOSIS — R739 Hyperglycemia, unspecified: Secondary | ICD-10-CM | POA: Diagnosis not present

## 2021-09-03 DIAGNOSIS — M0579 Rheumatoid arthritis with rheumatoid factor of multiple sites without organ or systems involvement: Secondary | ICD-10-CM | POA: Diagnosis not present

## 2021-09-03 HISTORY — PX: ESOPHAGOGASTRODUODENOSCOPY: SHX5428

## 2021-09-03 LAB — BASIC METABOLIC PANEL
Anion gap: 7 (ref 5–15)
BUN: 11 mg/dL (ref 6–20)
CO2: 21 mmol/L — ABNORMAL LOW (ref 22–32)
Calcium: 8.7 mg/dL — ABNORMAL LOW (ref 8.9–10.3)
Chloride: 104 mmol/L (ref 98–111)
Creatinine, Ser: 0.79 mg/dL (ref 0.61–1.24)
GFR, Estimated: >60 mL/min (ref >60)
Glucose, Bld: 243 mg/dL — ABNORMAL HIGH (ref 70–99)
Potassium: 3.8 mmol/L (ref 3.5–5.1)
Sodium: 132 mmol/L — ABNORMAL LOW (ref 135–145)

## 2021-09-03 LAB — GLUCOSE, CAPILLARY
Glucose-Capillary: 286 mg/dL — ABNORMAL HIGH (ref 70–99)
Glucose-Capillary: 230 mg/dL — ABNORMAL HIGH (ref 70–99)
Glucose-Capillary: 257 mg/dL — ABNORMAL HIGH (ref 70–99)

## 2021-09-03 SURGERY — EGD (ESOPHAGOGASTRODUODENOSCOPY)
Anesthesia: General

## 2021-09-03 MED ORDER — ALUM & MAG HYDROXIDE-SIMETH 200-200-20 MG/5ML PO SUSP
30.0000 mL | Freq: Once | ORAL | Status: AC
Start: 2021-09-03 — End: 2021-09-03
  Administered 2021-09-03: 30 mL via ORAL
  Filled 2021-09-03: qty 30

## 2021-09-03 MED ORDER — INSULIN GLARGINE-YFGN 100 UNIT/ML ~~LOC~~ SOLN
10.0000 [IU] | Freq: Every day | SUBCUTANEOUS | Status: DC
  Administered 2021-09-03 – 2021-09-04 (×2): 10 [IU] via SUBCUTANEOUS
  Filled 2021-09-03 (×3): qty 0.1

## 2021-09-03 MED ORDER — SODIUM CHLORIDE 0.9 % IV SOLN: INTRAVENOUS | Status: DC

## 2021-09-03 MED ORDER — PANTOPRAZOLE SODIUM 40 MG IV SOLR
40.0000 mg | INTRAVENOUS | Status: DC
  Administered 2021-09-03 – 2021-09-14 (×12): 40 mg via INTRAVENOUS
  Filled 2021-09-03 (×12): qty 10

## 2021-09-03 MED ORDER — LACTATED RINGERS IV SOLN: INTRAVENOUS | Status: DC | PRN

## 2021-09-03 MED ORDER — LIDOCAINE HCL (CARDIAC) PF 100 MG/5ML IV SOSY
PREFILLED_SYRINGE | INTRAVENOUS | Status: DC | PRN
  Administered 2021-09-03: 50 mg via INTRAVENOUS

## 2021-09-03 MED ORDER — PROPOFOL 10 MG/ML IV BOLUS
INTRAVENOUS | Status: DC | PRN
  Administered 2021-09-03: 150 ug/kg/min via INTRAVENOUS
  Administered 2021-09-03: 200 mg via INTRAVENOUS

## 2021-09-03 MED ORDER — METHYLPREDNISOLONE SODIUM SUCC 40 MG IJ SOLR
40.0000 mg | INTRAMUSCULAR | Status: DC
  Administered 2021-09-04 – 2021-09-11 (×8): 40 mg via INTRAVENOUS
  Filled 2021-09-03 (×8): qty 1

## 2021-09-03 MED ORDER — LIDOCAINE VISCOUS HCL 2 % MT SOLN
15.0000 mL | Freq: Once | OROMUCOSAL | Status: AC
  Administered 2021-09-03: 15 mL via ORAL
  Filled 2021-09-03: qty 15

## 2021-09-03 NOTE — Care Plan (Addendum)
EGD showed narrowing at upper esophageal sphincter, difficult to pass this point. Esophagus was normal with no candida but biopsies were done to rule out CMV. Recommend modified barium swallow to assess narrowing. Counseled patient that he will have a very sore throat the next few days.  Raylene Miyamoto MD, MPH [place]

## 2021-09-03 NOTE — Op Note (Signed)
[facility] Gastroenterology Patient Name: Sean [name] Procedure Date: 09/03/2021 4:29 PM MRN: [id number] Account #: [id number] Date of Birth: 08/06/1975 Admit Type: Inpatient Age: 46 Room: [facility] ENDO ROOM 1 Gender: Male Note Status: Finalized Instrument Name: Upper Endoscope [id number] Procedure:             Upper GI endoscopy Indications:           Dysphagia, Odynophagia Providers:             Andrey Farmer MD, MD Referring MD:          No Local Md, MD (Referring MD) Medicines:             Monitored Anesthesia Care Complications:         No immediate complications. Estimated blood loss:                         Minimal. Procedure:             Pre-Anesthesia Assessment:                        - Prior to the procedure, a History and Physical was                         performed, and patient medications and allergies were                         reviewed. The patient is competent. The risks and                         benefits of the procedure and the sedation options and                         risks were discussed with the patient. All questions                         were answered and informed consent was obtained.                         Patient identification and proposed procedure were                         verified by the physician, the nurse, the                         anesthesiologist, the anesthetist and the technician                         in the endoscopy suite. Mental Status Examination:                         alert and oriented. Airway Examination: normal                         oropharyngeal airway and neck mobility. Respiratory                         Examination: clear to auscultation. CV Examination:  normal. Prophylactic Antibiotics: The patient does not                         require prophylactic antibiotics. Prior                         Anticoagulants: The patient has taken no previous                          anticoagulant or antiplatelet agents. ASA Grade                         Assessment: III - A patient with severe systemic                         disease. After reviewing the risks and benefits, the                         patient was deemed in satisfactory condition to                         undergo the procedure. The anesthesia plan was to use                         monitored anesthesia care (MAC). Immediately prior to                         administration of medications, the patient was                         re-assessed for adequacy to receive sedatives. The                         heart rate, respiratory rate, oxygen saturations,                         blood pressure, adequacy of pulmonary ventilation, and                         response to care were monitored throughout the                         procedure. The physical status of the patient was                         re-assessed after the procedure.                        After obtaining informed consent, the endoscope was                         passed under direct vision. Throughout the procedure,                         the patient's blood pressure, pulse, and oxygen                         saturations were monitored continuously. The Endoscope  was introduced through the mouth, and advanced to the                         second part of duodenum. The upper GI endoscopy was                         somewhat difficult due to narrowing. The patient                         tolerated the procedure well. Findings:      Very narrowed upper esophageal sphincter      The examined esophagus was normal. Biopsies were taken with a cold       forceps for histology. Estimated blood loss was minimal.      The entire examined stomach was normal.      The examined duodenum was normal. Impression:            - Normal esophagus. Biopsied.                        - Normal stomach.                        - Normal  examined duodenum. Recommendation:        - Return patient to hospital ward for ongoing care.                        - Perform a modified barium swallow at appointment to                         be scheduled.                        - Await pathology results. Procedure Code(s):     --- Professional ---                        [phone], Esophagogastroduodenoscopy, flexible,                         transoral; with biopsy, single or multiple Diagnosis Code(s):     --- Professional ---                        R13.10, Dysphagia, unspecified CPT copyright 2019 [employer]. All rights reserved. The codes documented in this report are preliminary and upon coder review may  be revised to meet current compliance requirements. Andrey Farmer MD, MD 09/03/2021 4:57:33 PM Number of Addenda: 0 Note Initiated On: 09/03/2021 4:29 PM Estimated Blood Loss:  Estimated blood loss was minimal.      [facility]

## 2021-09-03 NOTE — Progress Notes (Signed)
PULMONOLOGY         Date: 09/03/2021,   MRN# [id number] Sean [name] Oct 24, 1975     AdmissionWeight: 72.6 kg                 CurrentWeight: 72.6 kg  Referring provider: Dr. Dareen Piano   CHIEF COMPLAINT:   Forceful cough with pneumomediastinum   HISTORY OF PRESENT ILLNESS   This is a 46 pleasant patient with a history of severe rheumatoid arthritis, seen previously while hospitalized due to bilateral multifocal pneumonia with treatment for community-acquired pneumonia and resolution.  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  Patient has been progressively dyspneic with reduction in spirometry performed in clinic via PFT.  Additional evaluation with rheumatology status post initiation of Humira infusions as well as CellCept and chronic prednisone use at higher dose starting 60 mg with slow taper.  On previous admission he also had moderate pleural effusion with resolution postthoracentesis and clinical improvement patient develops worsening symptoms when tapering [name] 45 mg daily including scaling of skin fingers and skin redness and erythema around nasolabial folds and worsening dyspnea and stiffness.  He came in on this admission with chest discomfort and severe dysphagia with cough.  He was seen in clinic and noted to have severe oral thrush status post ear nose and throat evaluation with course of Diflucan and partial resolution.  On examination there is still erythema in the upper airway and lower airway as well as remnants of thrush visible in the lower airway.  CT chest independently reviewed with findings of pneumomediastinum without esophageal perforation.  Overall interval improvement in lung parenchyma bilaterally.  Pulmonary critical care consultation placed for additional evaluation management   09/03/21-  patient seen at bedside. He has continued dysphagia  worse with pill swallowing. I have changed most of his PO meds to IV for now. Discussed medical plan with family and patient at bedside. Reviewed case with GI today there is plan for EGD. Reviewed CXR this am, mild b/l pleural effusions worse from prior but not clinicallyh significant, pneumonmediastinum is stable. Will continue to follow.   PAST MEDICAL HISTORY   Past Medical History:  Diagnosis Date   H/O immunosuppressive therapy    Hyperpigmentation of skin    Hyperpigmentation rash/vesicles of the palm and face   Neutropenia (HCC)    RA (rheumatoid arthritis) (HCC)      SURGICAL HISTORY   History reviewed. No pertinent surgical history.   FAMILY HISTORY   Family History  Problem Relation Age of Onset   Hyperlipidemia Mother    Hypertension Father      SOCIAL HISTORY   Social History   Tobacco Use   Smoking status: Former    Types: Cigarettes   Smokeless tobacco: Never  Vaping Use   Vaping Use: Never used  Substance Use Topics   Alcohol use: Not Currently   Drug use: Not Currently    Types: Marijuana     MEDICATIONS    Home Medication:    Current Medication:  Current Facility-Administered Medications:    acetaminophen (TYLENOL) tablet 650 mg, 650 mg, Oral, Q6H PRN, 650 mg at 09/03/21 0925 **OR** acetaminophen (TYLENOL) suppository 650 mg, 650 mg, Rectal, Q6H PRN, Mansy, Jan A, MD   Adalimumab PNKT 40 mg, 40 mg, Subcutaneous, Q14 Days, Leeroy Bock, MD, 40 mg at 09/03/21 0912   alum & mag hydroxide-simeth (MAALOX/MYLANTA) 200-200-20 MG/5ML suspension 30  mL, 30 mL, Oral, Once **AND** lidocaine (XYLOCAINE) 2 % viscous mouth solution 15 mL, 15 mL, Oral, Once, Karna ChristmasAleskerov, Yennifer Segovia, MD   azithromycin (ZITHROMAX) 500 mg in sodium chloride 0.9 % 250 mL IVPB, 500 mg, Intravenous, Q24H, Mansy, Jan A, MD, Stopped at 09/02/21 2209   ceFEPIme (MAXIPIME) 2 g in sodium chloride 0.9 % 100 mL IVPB, 2 g, Intravenous, Q8H, Beers, Sherlynn CarbonBrandon D, RPH, Stopped at 09/03/21 0648    enoxaparin (LOVENOX) injection 40 mg, 40 mg, Subcutaneous, Q24H, Mansy, Jan A, MD, 40 mg at 09/02/21 2102   feeding supplement (ENSURE ENLIVE / ENSURE PLUS) liquid 237 mL, 237 mL, Oral, TID BM, Anderson, Chelsey L, MD   fluticasone furoate-vilanterol (BREO ELLIPTA) 200-25 MCG/ACT 1 puff, 1 puff, Inhalation, Daily, 1 puff at 09/03/21 0912 **AND** umeclidinium bromide (INCRUSE ELLIPTA) 62.5 MCG/ACT 1 puff, 1 puff, Inhalation, Daily, Mansy, Jan A, MD, 1 puff at 09/03/21 0912   guaiFENesin-codeine 100-10 MG/5ML solution 10 mL, 10 mL, Oral, QID, Karna ChristmasAleskerov, Cletus Paris, MD, 10 mL at 09/03/21 0910   insulin aspart (novoLOG) injection 0-9 Units, 0-9 Units, Subcutaneous, Daily PRN, Leeroy BockAnderson, Chelsey L, MD   ipratropium-albuterol (DUONEB) 0.5-2.5 (3) MG/3ML nebulizer solution 3 mL, 3 mL, Nebulization, Q4H PRN, Mansy, Jan A, MD   ipratropium-albuterol (DUONEB) 0.5-2.5 (3) MG/3ML nebulizer solution 3 mL, 3 mL, Nebulization, BID, Jamelle RushingAnderson, Chelsey L, MD, 3 mL at 09/03/21 0815   magic mouthwash w/lidocaine, 5 mL, Oral, TID PRN, Leeroy BockAnderson, Chelsey L, MD, 5 mL at 09/03/21 0911   magnesium hydroxide (MILK OF MAGNESIA) suspension 30 mL, 30 mL, Oral, Daily PRN, Mansy, Jan A, MD   [START ON 09/04/2021] methylPREDNISolone sodium succinate (SOLU-MEDROL) 40 mg/mL injection 40 mg, 40 mg, Intravenous, Q24H, Kashden Deboy, MD   micafungin (MYCAMINE) 150 mg in sodium chloride 0.9 % 100 mL IVPB, 150 mg, Intravenous, Q24H, Dalin Caldera, MD, Stopped at 09/02/21 1608   morphine (PF) 2 MG/ML injection 2 mg, 2 mg, Intravenous, Q4H PRN, Mansy, Jan A, MD, 2 mg at 09/02/21 2325   ondansetron (ZOFRAN) tablet 4 mg, 4 mg, Oral, Q6H PRN **OR** ondansetron (ZOFRAN) injection 4 mg, 4 mg, Intravenous, Q6H PRN, Mansy, Jan A, MD   pantoprazole (PROTONIX) injection 40 mg, 40 mg, Intravenous, Q24H, Bryant Saye, MD   pneumococcal 20-valent conjugate vaccine (PREVNAR 20) injection 0.5 mL, 0.5 mL, Intramuscular, Tomorrow-1000, Mansy, Jan A, MD    sucralfate (CARAFATE) 1 GM/10ML suspension 1 g, 1 g, Oral, Once, Shaune PollackIsaacs, [name], MD   Vitamin D (Ergocalciferol) (DRISDOL) capsule 50,000 Units, 50,000 Units, Oral, Weekly, Mansy, Jan A, MD, 50,000 Units at 09/02/21 0900    ALLERGIES   Patient has no known allergies.     REVIEW OF SYSTEMS    Review of Systems:  Gen:  Denies  fever, sweats, chills weigh loss  HEENT: Denies blurred vision, double vision, ear pain, eye pain, hearing loss, nose bleeds, sore throat Cardiac:  No dizziness, chest pain or heaviness, chest tightness,edema Resp:   reports dyspnea chronically  Gi: Denies swallowing difficulty, stomach pain, nausea or vomiting, diarrhea, constipation, bowel incontinence Gu:  Denies bladder incontinence, burning urine Ext:   Denies Joint pain, stiffness or swelling Skin: Denies  skin rash, easy bruising or bleeding or hives Endoc:  Denies polyuria, polydipsia , polyphagia or weight change Psych:   Denies depression, insomnia or hallucinations   Other:  All other systems negative   VS: BP 131/90 (BP Location: Right Arm)   Pulse 96   Temp 98.1 F (36.7 C)  Resp (!) 24   Ht  (1.956 m)   Wt 72.6 kg   SpO2 90%   BMI 18.97 kg/m      PHYSICAL EXAM    GENERAL:NAD, no fevers, chills, no weakness no fatigue HEAD: Normocephalic, atraumatic.  EYES: Pupils equal, round, reactive to light. Extraocular muscles intact. No scleral icterus.  MOUTH: Moist mucosal membrane. Dentition intact. No abscess noted.  EAR, NOSE, THROAT: Clear without exudates. No external lesions.  NECK: Supple. No thyromegaly. No nodules. No JVD.  PULMONARY: decreased breath sounds with mild rhonchi worse at bases bilaterally.  CARDIOVASCULAR: S1 and S2. Regular rate and rhythm. No murmurs, rubs, or gallops. No edema. Pedal pulses 2+ bilaterally.  GASTROINTESTINAL: Soft, nontender, nondistended. No masses. Positive bowel sounds. No hepatosplenomegaly.  MUSCULOSKELETAL: No swelling, clubbing,  or edema. Range of motion full in all extremities.  NEUROLOGIC: Cranial nerves II through XII are intact. No gross focal neurological deficits. Sensation intact. Reflexes intact.  SKIN: No ulceration, lesions, rashes, or cyanosis. Skin warm and dry. Turgor intact.  PSYCHIATRIC: Mood, affect within normal limits. The patient is awake, alert and oriented x 3. Insight, judgment intact.       IMAGING        ASSESSMENT/PLAN   Subacute chest discomfort and severe dysphagia Secondary to forceful cough likely due to recurrent candidiasis with thrush -Failure of Diflucan p.o. and outpatient status post ENT evaluation -Have initiated micafungin IV for now -Supportive care for pneumomediastinum including reduction of shearing forces, avoid incentive spirometry, BiPAP/CPAP, flutter valve -Agree with empiric cefepime and Zithromax until microbiology returns -GI consultation-plan for EGD today -Guaifenesin with codeine solution 4 times daily -will add nasal canula O2 for supportive care to allow diffusion of nitrogen rich gas out of soft tissue   Recurrent bilateral pleural effusion Previous thoracentesis with fluid pattern showing mildly ascitic eosinophilic predominant exudate consistent with rheumatoid associated effusions -Have DC'd IVF due to likely contributing to redevelopment of pleural effusion -Clinically patient is not dehydrated   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     Thank you for allowing me to participate in the care of this patient.   Patient/Family are satisfied with care plan and all questions have been answered.    Provider disclosure: Patient with at least one acute or chronic illness or injury that  poses a threat to life or bodily function and is being managed actively during this encounter.  All of the [name] services have been performed independently by signing provider:  review of prior documentation from internal and or external health records.  Review of previous and current lab results.  Interview and comprehensive assessment during patient visit today. Review of current and previous chest radiographs/CT scans. Discussion of management and test interpretation with health care team and patient/family.   This document was prepared using Dragon voice recognition software and may include unintentional dictation errors.     Vida Rigger, M.D.  Division of Pulmonary & Critical Care Medicine

## 2021-09-03 NOTE — Anesthesia Postprocedure Evaluation (Signed)
Anesthesia Post Note  Patient: Sean [name]  Procedure(s) Performed: ESOPHAGOGASTRODUODENOSCOPY (EGD)  Patient location during evaluation: Endoscopy Anesthesia Type: General Level of consciousness: awake and alert Pain management: pain level controlled Vital Signs Assessment: post-procedure vital signs reviewed and stable Respiratory status: spontaneous breathing, nonlabored ventilation, respiratory function stable and patient connected to nasal cannula oxygen Cardiovascular status: blood pressure returned to baseline and stable Postop Assessment: no apparent nausea or vomiting Anesthetic complications: no   No notable events documented.   Last Vitals:  Vitals:   09/03/21 1658 09/03/21 1708  BP: 108/70 (!) 142/88  Pulse: 100 95  Resp: 20 19  Temp: 36.7 C   SpO2: 98% 99%    Last Pain:  Vitals:   09/03/21 1658  TempSrc: Temporal  PainSc: 10-Worst pain ever                 Cleda MccreedyJoseph K Gloria Ricardo

## 2021-09-03 NOTE — Anesthesia Preprocedure Evaluation (Addendum)
Anesthesia Evaluation  Patient identified by MRN, date of birth, ID band Patient awake    Reviewed: Allergy & Precautions, NPO status , Patient's Chart, lab work & pertinent test results  Airway Mallampati: III  TM Distance: >3 FB Neck ROM: full    Dental  (+) Chipped   Pulmonary pneumonia, former smoker,    Pulmonary exam normal        Cardiovascular Exercise Tolerance: Good (-) angina(-) Past MI and (-) DOE Normal cardiovascular exam     Neuro/Psych negative neurological ROS  negative psych ROS   GI/Hepatic negative GI ROS, Neg liver ROS, neg GERD  ,  Endo/Other  negative endocrine ROS  Renal/GU negative Renal ROS  negative genitourinary   Musculoskeletal  (+) Arthritis ,   Abdominal   Peds  Hematology negative hematology ROS (+)   Anesthesia Other Findings Patient reports that they do that think that any food or pills are stuck in their throat at this time.  Past Medical History: No date: H/O immunosuppressive therapy No date: Hyperpigmentation of skin     Comment:  Hyperpigmentation rash/vesicles of the palm and face No date: Neutropenia (HCC) No date: RA (rheumatoid arthritis) (HCC)  History reviewed. No pertinent surgical history.  BMI    Body Mass Index: 18.98 kg/m      Reproductive/Obstetrics negative OB ROS                            Anesthesia Physical Anesthesia Plan  ASA: 3  Anesthesia Plan: General   Post-op Pain Management:    Induction: Intravenous  PONV Risk Score and Plan: Propofol infusion and TIVA  Airway Management Planned: Natural Airway and Nasal Cannula  Additional Equipment:   Intra-op Plan:   Post-operative Plan:   Informed Consent: I have reviewed the patients History and Physical, chart, labs and discussed the procedure including the risks, benefits and alternatives for the proposed anesthesia with the patient or authorized representative  who has indicated his/her understanding and acceptance.     Dental Advisory Given  Plan Discussed with: Anesthesiologist, CRNA and Surgeon  Anesthesia Plan Comments: (Patient consented for risks of anesthesia including but not limited to:  - adverse reactions to medications - risk of airway placement if required - damage to eyes, teeth, lips or other oral mucosa - nerve damage due to positioning  - sore throat or hoarseness - Damage to heart, brain, nerves, lungs, other parts of body or loss of life  Patient voiced understanding.)        Anesthesia Quick Evaluation

## 2021-09-03 NOTE — Transfer of Care (Signed)
Immediate Anesthesia Transfer of Care Note  Patient: Sean [name]  Procedure(s) Performed: ESOPHAGOGASTRODUODENOSCOPY (EGD)  Patient Location: PACU and Endoscopy Unit  Anesthesia Type:MAC  Level of Consciousness: drowsy  Airway & Oxygen Therapy: Patient Spontanous Breathing  Post-op Assessment: Report given to RN and Post -op Vital signs reviewed and stable  Post vital signs: Reviewed and stable  Last Vitals:  Vitals Value Taken Time  BP    Temp    Pulse 99 09/03/21 1659  Resp 22 09/03/21 1659  SpO2 100 % 09/03/21 1659  Vitals shown include unvalidated device data.  Last Pain:  Vitals:   09/03/21 1135  TempSrc:   PainSc: 6          Complications: No notable events documented.

## 2021-09-03 NOTE — Progress Notes (Signed)
PROGRESS NOTE  Sean [name]    DOB: 07-18-1975, 46 y.o.  [id number]    Code Status: Full Code   DOA: 09/01/2021   LOS: 2   Brief hospital course  Sean [name] is a 46 y.o. 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  They presented from home to the ED on 09/01/2021 with dyspnea and dysphagia/odynophagia, cough x several days worsening.  Patient recently treated with Diflucan for esophageal candidiasis infection. Thoracentesis 4/16  In the ED, it was found that they had temperature as high as 100.1, tachycardia up to 105, tachypnea up to 23, O2 sats 88% on room air, stable blood pressure.  Significant findings included blood glucose of 250 with calcium of 8.7 and ALT of 52 with albumin of 3 and total protein of 7.1.  High-sensitivity troponin was 9 and later 11.  Procalcitonin was less than 0.1 and CBC was unremarkable. Chest CTA revealed no evidence for PE.  Showed acute pneumomediastinum with extension to lower neck with no pneumonia no thorax/pleural air.  It showed small effusions less than previously seen.  It showed bilateral lower lobe volume loss/pneumonia with areas of cystic changes.  Findings could be due to infectious pneumonia and or rheumatoid  lung disease.  Chest CT without contrast revealed extensive pneumomediastinum and contrast-filled esophagus was patent with no extraluminal contrast extravasation.  It showed multiple focal and confluent infiltrates that are scattered throughout the lungs suggestive of multifocal pneumonia with small bilateral pleural effusions. Pulmonology was consulted.  They were initially treated with cefepime, vancomycin, Zithromax.   Patient was admitted to medicine service for further workup and management of pneumonia as outlined in detail below.  09/03/21 -stable, improved  Assessment & Plan  Principal Problem:    Bilateral pneumonia Active Problems:   Rheumatoid arthritis involving multiple sites with positive rheumatoid factor (HCC)   Steroid-induced hyperglycemia   Chest pain  Bilateral pneumonia  pneumomediastinum- stable on room air. Repeated chest xray today due to worsening pain reported but stable exam. Repeat was unchanged from previous. -Pulmonology consulted.  Providers familiar with the patient's medical history.  Appreciate recommendations and care -Continue cefepime, Zithromax -Antitussive's as needed   Interstitial lung disease  rheumatoid arthritis involving multiple sites with positive rheumatoid factor (Antoine)- humira dose due today, brought in from home. -Continue Humira, CellCept, prednisone taper -Pulmonology has discontinued Plaquenil due to side effect profile -Low threshold to re-image chest if worsening pain or respirations due to concern of expanding pneumo mediastinum  Odynophagia  dysphagia- thought to be related to Candida esophagitis in setting of immune suppression.  Patient's biggest complaint is the discomfort with swallowing and currently denies dyspnea.  He states that prior to admission he was taking 800 mg ibuprofen twice daily for several days.  The morphine given and patient helped somewhat. -Analgesia as needed -GI consulted, appreciate recommendations  -EGD 5/25 - PPI -Micafungin IV for refractory candidal infection suspected - magic mouthwash with lidocaine TID PRN   Steroid-induced hyperglycemia - started lantus 10units today and monitor blood sugars as he is NPO for most of the day  Body mass index is 18.97 kg/m.  VTE ppx: enoxaparin (LOVENOX) injection 40 mg Start: 09/01/21 2200   Diet:     Diet   Diet NPO time specified   Consultants: Pulmonology GI Subjective 09/03/21    Pt reports feeling worsening pressure/pain in his throat and mediastinum. He is  hungry but cannot eat due to procedure today. Denies respiratory complaints.     Objective   Vitals:   09/02/21 2032 09/02/21 2320 09/03/21 0334 09/03/21 0731  BP:  123/83 125/79 131/90  Pulse:  98 96 97  Resp:  16 16 18   Temp:  98 F (36.7 C) 98.5 F (36.9 C) 98.1 F (36.7 C)  TempSrc:   Oral   SpO2: 95% 97% 96% 93%  Weight:      Height:        Intake/Output Summary (Last 24 hours) at 09/03/2021 0747 Last data filed at 09/03/2021 0651 Gross per 24 hour  Intake 3624.7 ml  Output 1850 ml  Net 1774.7 ml   Filed Weights   09/01/21 1223  Weight: 72.6 kg     Physical Exam:  General: awake, alert, mild distress HEENT: atraumatic, clear conjunctiva, anicteric sclera, MMM, hearing grossly normal.  Pharynx erythematous Respiratory: normal respiratory effort. Crackles diffusely. Worse in bases. Cardiovascular: quick capillary refill Gastrointestinal: soft, NT, ND Nervous: A&O x3. no gross focal neurologic deficits, normal speech Extremities: moves all equally, no edema, normal tone Skin: dry, intact, normal temperature, normal color. No rashes, lesions or ulcers on exposed skin Psychiatry: normal mood, congruent affect  Labs   I have personally reviewed the following labs and imaging studies CBC    Component Value Date/Time   WBC 10.3 09/02/2021 0612   RBC 3.93 (L) 09/02/2021 0612   HGB 12.3 (L) 09/02/2021 0612   HGB 13.2 07/26/2021 0905   HCT 36.3 (L) 09/02/2021 0612   HCT 39.2 07/26/2021 0905   PLT 186 09/02/2021 0612   PLT 149 (L) 07/26/2021 0905   MCV 92.4 09/02/2021 0612   MCV 92 07/26/2021 0905   MCH 31.3 09/02/2021 0612   MCHC 33.9 09/02/2021 0612   RDW 12.8 09/02/2021 0612   RDW 12.9 07/26/2021 0905   LYMPHSABS 0.8 07/26/2021 0905   MONOABS 0.4 07/25/2021 1608   EOSABS 0.1 07/26/2021 0905   BASOSABS 0.0 07/26/2021 0905      Latest Ref Rng & Units 09/02/2021    1:18 PM 09/02/2021    6:12 AM 09/01/2021   12:27 PM  BMP  Glucose 70 - 99 mg/dL 376   261   250    BUN 6 - 20 mg/dL 12   10   12     Creatinine 0.61 - 1.24 mg/dL 0.79   0.86    0.90    Sodium 135 - 145 mmol/L 128   133   135    Potassium 3.5 - 5.1 mmol/L 4.2   4.7   3.7    Chloride 98 - 111 mmol/L 98   103   104    CO2 22 - 32 mmol/L 23   24   24     Calcium 8.9 - 10.3 mg/dL 8.4   8.3   8.7      DG Chest 2 View  Result Date: 09/01/2021 CLINICAL DATA:  Chest pain. EXAM: CHEST - 2 VIEW COMPARISON:  July 26, 2021.  Aug 20, 2021. FINDINGS: The heart size and mediastinal contours are within normal limits. Stable bibasilar atelectasis or infiltrates are noted with associated pleural effusions. The visualized skeletal structures are unremarkable. IMPRESSION: Stable bibasilar atelectasis or infiltrates are noted with associated pleural effusions. Electronically Signed   By: Marijo Conception M.D.   On: 09/01/2021 12:59   CT CHEST WO CONTRAST  Result Date: 09/01/2021 CLINICAL DATA:  Esophageal perforation with pneumo mediastinum. Right-sided chest pain  for a few days. EXAM: CT CHEST WITHOUT CONTRAST TECHNIQUE: Multidetector CT imaging of the chest was performed following the standard protocol without IV contrast. RADIATION DOSE REDUCTION: This exam was performed according to the departmental dose-optimization program which includes automated exposure control, adjustment of the mA and/or kV according to patient size and/or use of iterative reconstruction technique. COMPARISON:  CT angiography of the chest 09/01/2021 FINDINGS: Cardiovascular: Mild cardiac enlargement. No pericardial effusions. Normal caliber thoracic aorta. Mediastinum/Nodes: Extensive pneumomediastinum is again demonstrated throughout the mediastinum and extending into the base of the neck and supraclavicular regions. Contrast material fills the esophagus. The esophagus is patent to the stomach without any definite contrast extravasation. No significant lymphadenopathy. Lungs/Pleura: Emphysematous changes in the lungs. Coarse multifocal and confluent infiltrates scattered throughout the lungs with consolidation in the  lung bases. This may represent multifocal pneumonia. Small bilateral pleural effusions. Airways are patent. Upper Abdomen: No acute abnormalities demonstrated. Musculoskeletal: No chest wall mass or suspicious bone lesions identified. IMPRESSION: 1. Extensive pneumomediastinum as previously demonstrated. Contrast filled esophagus is patent and no extraluminal contrast extravasation is identified. 2. Multiple focal and confluent infiltrates are scattered throughout the lungs, suggesting multifocal pneumonia. Small bilateral pleural effusions. Electronically Signed   By: Lucienne Capers M.D.   On: 09/01/2021 18:31   CT Angio Chest PE W and/or Wo Contrast  Result Date: 09/01/2021 CLINICAL DATA:  Pulmonary embolism suspected. High probability. Right-sided chest pain. Rheumatoid arthritis. EXAM: CT ANGIOGRAPHY CHEST WITH CONTRAST TECHNIQUE: Multidetector CT imaging of the chest was performed using the standard protocol during bolus administration of intravenous contrast. Multiplanar CT image reconstructions and MIPs were obtained to evaluate the vascular anatomy. RADIATION DOSE REDUCTION: This exam was performed according to the departmental dose-optimization program which includes automated exposure control, adjustment of the mA and/or kV according to patient size and/or use of iterative reconstruction technique. CONTRAST:  63mL OMNIPAQUE IOHEXOL 350 MG/ML SOLN COMPARISON:  Chest radiography same day. Prior CT angiography 07/25/2021. FINDINGS: Cardiovascular: Heart size upper limits of normal. No coronary artery calcification or aortic atherosclerotic calcification. Pulmonary arterial opacification is good. There are no pulmonary emboli. Mediastinum/Nodes: There is pneumomediastinum, extending into the lower neck. No evidence of mass. Few small mediastinal nodes as seen previously. Lungs/Pleura: Small pleural effusions layering dependently. Bilateral lower lobe atelectasis/pneumonia with areas of cystic change.  This could be due to infectious pneumonia or rheumatoid lung disease. There is no pneumothorax/pleural air. Upper Abdomen: Negative Musculoskeletal: No acute spinal finding. Review of the MIP images confirms the above findings. IMPRESSION: No pulmonary emboli. Acute pneumomediastinum, with extension into the lower neck. No pneumothorax/pleural air. Small effusions, less than were seen previously. Bilateral lower lobe volume loss/pneumonia with areas of cystic change. Findings could be due to infectious pneumonia and or rheumatoid lung disease. Electronically Signed   By: Nelson Chimes M.D.   On: 09/01/2021 16:38    Disposition Plan & Communication  Patient status: Inpatient  Admitted From: Home Planned disposition location: Home Anticipated discharge date: 5/26 pending further GI work-up and treatment of candidal pharyngitis  Family Communication: Mother at bedside   Author: [name], DO [facility] 09/03/2021, 7:47 AM   Available by Epic secure chat 7AM-7PM. If 7PM-7AM, please contact night-coverage.  [facility] contact information found on [email or web address].

## 2021-09-03 NOTE — Progress Notes (Signed)
Inpatient Diabetes Program Recommendations  AACE/ADA: New Consensus Statement on Inpatient Glycemic Control (2015)  Target Ranges:  Prepandial:   less than 140 mg/dL      Peak postprandial:   less than 180 mg/dL (1-2 hours)      Critically ill patients:  140 - 180 mg/dL   Lab Results  Component Value Date   GLUCAP 286 (H) 09/03/2021    Review of Glycemic Control  Latest Reference Range & Units 09/01/21 12:27 09/02/21 06:12 09/02/21 13:18  Glucose 70 - 99 mg/dL 889 (H) 169 (H) 450 (H)    Current orders for Inpatient glycemic control:  Novolog 0-9 units tid prn (will not be scheduled for RN to be prompted to give)  PO prednisone 30 mg Daily Ensure Enlive tid between meals  Inpatient Diabetes Program Recommendations:    Pt not receiving insulin  - schedule Novolog Correction 0-15 units tid and hs scale (ordered as prn)   -  Pt may also benefit from adding Novolog 3 units tid meal coverage if eating>50% of meals.  Thanks,  Christena Deem RN, MSN, BC-ADM Inpatient Diabetes Coordinator Team Pager [phone] (8a-5p)

## 2021-09-04 ENCOUNTER — Inpatient Hospital Stay: Payer: 59

## 2021-09-04 ENCOUNTER — Encounter: Payer: Self-pay | Admitting: Gastroenterology

## 2021-09-04 DIAGNOSIS — R079 Chest pain, unspecified: Secondary | ICD-10-CM | POA: Diagnosis not present

## 2021-09-04 DIAGNOSIS — T380X5A Adverse effect of glucocorticoids and synthetic analogues, initial encounter: Secondary | ICD-10-CM | POA: Diagnosis not present

## 2021-09-04 DIAGNOSIS — M0579 Rheumatoid arthritis with rheumatoid factor of multiple sites without organ or systems involvement: Secondary | ICD-10-CM | POA: Diagnosis not present

## 2021-09-04 DIAGNOSIS — R739 Hyperglycemia, unspecified: Secondary | ICD-10-CM | POA: Diagnosis not present

## 2021-09-04 LAB — GLUCOSE, CAPILLARY: Glucose-Capillary: 252 mg/dL — ABNORMAL HIGH (ref 70–99)

## 2021-09-04 MED ORDER — SODIUM CHLORIDE 0.9 % IV SOLN
INTRAVENOUS | Status: DC | PRN
  Administered 2021-09-04: 50 mL via INTRAVENOUS
  Administered 2021-09-10: 10 mL via INTRAVENOUS

## 2021-09-04 MED ORDER — ALUM & MAG HYDROXIDE-SIMETH 200-200-20 MG/5ML PO SUSP
30.0000 mL | Freq: Once | ORAL | Status: AC
  Administered 2021-09-04: 30 mL via ORAL
  Filled 2021-09-04: qty 30

## 2021-09-04 MED ORDER — DICLOFENAC SODIUM 1 % EX GEL
2.0000 g | Freq: Four times a day (QID) | CUTANEOUS | Status: DC
Start: 2021-09-04 — End: 2021-09-14
  Administered 2021-09-04 – 2021-09-14 (×33): 2 g via TOPICAL
  Filled 2021-09-04: qty 100

## 2021-09-04 MED ORDER — LIDOCAINE VISCOUS HCL 2 % MT SOLN
15.0000 mL | Freq: Three times a day (TID) | OROMUCOSAL | Status: AC
  Administered 2021-09-04 – 2021-09-05 (×6): 15 mL via ORAL
  Filled 2021-09-04 (×6): qty 15

## 2021-09-04 NOTE — Progress Notes (Signed)
PROGRESS NOTE  Sean [name]    DOB: 27-May-1975, 45 y.o.  [id number]    Code Status: Full Code   DOA: 09/01/2021   LOS: 3   Brief hospital course  Sean [name] is a 46 y.o. 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  They presented from home to the ED on 09/01/2021 with dyspnea and dysphagia/odynophagia, cough x several days worsening.  Patient recently treated with Diflucan for esophageal candidiasis infection. Thoracentesis 4/16  In the ED, it was found that they had temperature as high as 100.1, tachycardia up to 105, tachypnea up to 23, O2 sats 88% on room air, stable blood pressure.  Significant findings included blood glucose of 250 with calcium of 8.7 and ALT of 52 with albumin of 3 and total protein of 7.1.  High-sensitivity troponin was 9 and later 11.  Procalcitonin was less than 0.1 and CBC was unremarkable. Chest CTA revealed no evidence for PE.  Showed acute pneumomediastinum with extension to lower neck with no pneumonia no thorax/pleural air.  It showed small effusions less than previously seen.  It showed bilateral lower lobe volume loss/pneumonia with areas of cystic changes.  Findings could be due to infectious pneumonia and or rheumatoid  lung disease.  Chest CT without contrast revealed extensive pneumomediastinum and contrast-filled esophagus was patent with no extraluminal contrast extravasation.  It showed multiple focal and confluent infiltrates that are scattered throughout the lungs suggestive of multifocal pneumonia with small bilateral pleural effusions. Pulmonology was consulted.  They were initially treated with cefepime, vancomycin, Zithromax.   Patient was admitted to medicine service for further workup and management of pneumonia as outlined in detail below.  09/04/21 -stable, improved  Assessment & Plan  Principal Problem:    Bilateral pneumonia Active Problems:   Rheumatoid arthritis involving multiple sites with positive rheumatoid factor (HCC)   Steroid-induced hyperglycemia   Chest pain  Bilateral pneumonia  pneumomediastinum- stable on room air. Stable repeat xray yesterday.  -Pulmonology consulted.  Provider is familiar with the patient's medical history.  Appreciate recommendations and care -Continue cefepime, Zithromax -Antitussive's as needed - supplemental oxygen   Interstitial lung disease  rheumatoid arthritis involving multiple sites with positive rheumatoid factor (HCC)- humira dose 5/25, brought in from home. -Continue Humira, prednisone taper -Pulmonology has discontinued Plaquenil due to side effect profile, and cellcept -Low threshold to re-image chest if worsening pain or respirations due to concern of expanding pneumo mediastinum - CT neck today to evaluate pain that can be attributed to RA in cervical spine  - votlaren gel, analgesia PRN  Odynophagia  dysphagia- thought to be related to Candida esophagitis in setting of immune suppression. EGD 5/25 unrevealing of yeast but biopsies were taken to r/o CMV or other. Patient states that magic mouthwash helped -Analgesia as needed -GI consulted, appreciate recommendations  -EGD 5/25 showed narrowing of upper esophageal opening, biopsy results pending  - recommended modified barium swallow outpatient - PPI -Micafungin IV for refractory candidal infection suspected - magic mouthwash with lidocaine TID PRN   Steroid-induced hyperglycemia - started lantus 10units  Body mass index is 18.98 kg/m.  VTE ppx: enoxaparin (LOVENOX) injection 40 mg Start: 09/01/21 2200   Diet:     Diet   Diet regular Room service appropriate? Yes; Fluid consistency: Thin   Consultants: Pulmonology GI Subjective 09/04/21    Sean [name] reports continued pain in throat with swallowing and back  of neck. Denies respiratory complaints.     Objective   Vitals:    09/03/21 2049 09/03/21 2349 09/04/21 0413 09/04/21 0721  BP:  105/66 118/66   Pulse:  (!) 105 90   Resp:  18 16   Temp:  99.1 F (37.3 C) 97.9 F (36.6 C)   TempSrc:   Oral   SpO2: 96% 98% 98% 95%  Weight:      Height:        Intake/Output Summary (Last 24 hours) at 09/04/2021 0755 Last data filed at 09/04/2021 0500 Gross per 24 hour  Intake 289.65 ml  Output 300 ml  Net -10.35 ml    Filed Weights   09/01/21 1223 09/03/21 1525  Weight: 72.6 kg 72.6 kg     Physical Exam:  General: awake, alert, mild distress HEENT: atraumatic, clear conjunctiva, anicteric sclera, MMM, hearing grossly normal.  Pharynx erythematous Respiratory: normal respiratory effort. Crackles diffusely. Worse in bases. Cardiovascular: quick capillary refill Gastrointestinal: soft, NT, ND Nervous: A&O x3. no gross focal neurologic deficits, normal speech Extremities: moves all equally, no edema, normal tone Skin: dry, intact, normal temperature, normal color. No rashes, lesions or ulcers on exposed skin Psychiatry: normal mood, congruent affect  Labs   I have personally reviewed the following labs and imaging studies CBC    Component Value Date/Time   WBC 10.3 09/02/2021 0612   RBC 3.93 (L) 09/02/2021 0612   HGB 12.3 (L) 09/02/2021 0612   HGB 13.2 07/26/2021 0905   HCT 36.3 (L) 09/02/2021 0612   HCT 39.2 07/26/2021 0905   PLT 186 09/02/2021 0612   PLT 149 (L) 07/26/2021 0905   MCV 92.4 09/02/2021 0612   MCV 92 07/26/2021 0905   MCH 31.3 09/02/2021 0612   MCHC 33.9 09/02/2021 0612   RDW 12.8 09/02/2021 0612   RDW 12.9 07/26/2021 0905   LYMPHSABS 0.8 07/26/2021 0905   MONOABS 0.4 07/25/2021 1608   EOSABS 0.1 07/26/2021 0905   BASOSABS 0.0 07/26/2021 0905      Latest Ref Rng & Units 09/03/2021    8:06 AM 09/02/2021    1:18 PM 09/02/2021    6:12 AM  BMP  Glucose 70 - 99 mg/dL 161   096   045    BUN 6 - 20 mg/dL Creatinine 0.61 - 1.24 mg/dL 4.09   8.11   9.14    Sodium  135 - 145 mmol/L 132   128   133    Potassium 3.5 - 5.1 mmol/L 3.8   4.2   4.7    Chloride 98 - 111 mmol/L 104   98   103    CO2 22 - 32 mmol/L Calcium 8.9 - 10.3 mg/dL 8.7   8.4   8.3      DG Chest Port 1 View  Result Date: 09/03/2021 CLINICAL DATA:  Monitoring pneumomediastinum expansion as patient endorses increased pain/pressure EXAM: PORTABLE CHEST 1 VIEW COMPARISON:  Chest radiograph May 23, 23. FINDINGS: Similar bibasilar opacities. Similar pneumomediastinum with gas in the partially visualized lower neck. Similar cardiomediastinal silhouette. Small bilateral pleural effusions. No definite pneumothorax. IMPRESSION: Similar bibasilar opacities, pneumomediastinum, and small bilateral pleural effusions. Electronically Signed   By: Feliberto Harts M.D.   On: 09/03/2021 10:29    Disposition Plan & Communication  Patient status: Inpatient  Admitted From: Home Planned disposition location: Home Anticipated discharge date: 5/27 pending  further GI work-up and treatment of pharyngitis  Family Communication: girlfriend at bedside   Author: Leeroy Bockhelsey L Estaban [place], DO [facility] 09/04/2021, 7:55 AM   Available by Epic secure chat 7AM-7PM. If 7PM-7AM, please contact night-coverage.  [facility] contact information found on [email or web address].

## 2021-09-04 NOTE — TOC Initial Note (Signed)
Transition of Care [facility]) - Initial/Assessment Note    Patient Details  Name: Sean [name] MRN: [id number] Date of Birth: 07-22-1975  [facility]) CM/SW Contact:    Truddie Hidden, RN Phone Number: 09/04/2021, 10:38 AM  Clinical Narrative:                  Transition of Care [facility]) Screening Note   Patient Details  Name: Sean [name] Date of Birth: [birth year]   Transition of Care [facility]) CM/SW Contact:    Truddie Hidden, RN Phone Number: 09/04/2021, 10:38 AM    Transition of Care Department (TOC) has reviewed patient and no TOC needs have been identified at this time. We will continue to monitor patient advancement through interdisciplinary progression rounds. If new patient transition needs arise, please place a TOC consult.          Patient Goals and CMS Choice        Expected Discharge Plan and Services                                                Prior Living Arrangements/Services                       Activities of Daily Living Home Assistive Devices/Equipment: None ADL Screening (condition at time of admission) Patient's cognitive ability adequate to safely complete daily activities?: Yes Is the patient deaf or have difficulty hearing?: No Does the patient have difficulty seeing, even when wearing glasses/contacts?: No Does the patient have difficulty concentrating, remembering, or making decisions?: No Patient able to express need for assistance with ADLs?: Yes Does the patient have difficulty dressing or bathing?: Yes Independently performs ADLs?: Yes (appropriate for developmental age) Does the patient have difficulty walking or climbing stairs?: No Weakness of Legs: Both Weakness of Arms/Hands: Both  Permission Sought/Granted                  Emotional Assessment              Admission diagnosis:  Bilateral pneumonia [J18.9] Patient Active Problem List   Diagnosis Date Noted    Steroid-induced hyperglycemia 09/02/2021   Chest pain    Bilateral pneumonia 09/01/2021   Pleural effusion 07/27/2021   Elevated LFTs 07/27/2021   Rheumatoid arthritis involving multiple sites with positive rheumatoid factor (HCC) 03/26/2021   PCP:  [name], MD Pharmacy:   [facility] [phone] [place], [place] - [address] AT [facility] [address] [place] [address] Phone: [phone] Fax: [phone]  [facility] - [place], [place] - [address] [place] [address] Phone: [phone] Fax: [phone]     Social Determinants of Health (SDOH) Interventions    Readmission Risk Interventions     View : No data to display.

## 2021-09-04 NOTE — Progress Notes (Signed)
PULMONOLOGY         Date: 09/04/2021,   MRN# [id number] Sean [name] May 24, 1975     AdmissionWeight: 72.6 kg                 CurrentWeight: 72.6 kg  Referring provider: Dr. Dareen Piano   CHIEF COMPLAINT:   Forceful cough with pneumomediastinum   HISTORY OF PRESENT ILLNESS   This is a 46 pleasant patient with a history of severe rheumatoid arthritis, seen previously while hospitalized due to bilateral multifocal pneumonia with treatment for community-acquired pneumonia and resolution.  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  Patient has been progressively dyspneic with reduction in spirometry performed in clinic via PFT.  Additional evaluation with rheumatology status post initiation of Humira infusions as well as CellCept and chronic prednisone use at higher dose starting 60 mg with slow taper.  On previous admission he also had moderate pleural effusion with resolution postthoracentesis and clinical improvement patient develops worsening symptoms when tapering below 45 mg daily including scaling of skin fingers and skin redness and erythema around nasolabial folds and worsening dyspnea and stiffness.  He came in on this admission with chest discomfort and severe dysphagia with cough.  He was seen in clinic and noted to have severe oral thrush status post ear nose and throat evaluation with course of Diflucan and partial resolution.  On examination there is still erythema in the upper airway and lower airway as well as remnants of thrush visible in the lower airway.  CT chest independently reviewed with findings of pneumomediastinum without esophageal perforation.  Overall interval improvement in lung parenchyma bilaterally.  Pulmonary critical care consultation placed for additional evaluation management   09/03/21-  patient seen at bedside. He has continued dysphagia  worse with pill swallowing. I have changed most of his PO meds to IV for now. Discussed medical plan with family and patient at bedside. Reviewed case with GI today there is plan for EGD. Reviewed CXR this am, mild b/l pleural effusions worse from prior but not clinicallyh significant, pneumonmediastinum is stable. Will continue to follow.   09/04/21 - patient seems improved.  He continues to have pain with swallowing. 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 There were biopsies done. We will obtain CT neck to eval cervical spine  PAST MEDICAL HISTORY   Past Medical History:  Diagnosis Date  . H/O immunosuppressive therapy   . Hyperpigmentation of skin    Hyperpigmentation rash/vesicles of the palm and face  . Neutropenia (HCC)   . RA (rheumatoid arthritis) (HCC)      SURGICAL HISTORY   History reviewed. No pertinent surgical history.   FAMILY HISTORY   Family History  Problem Relation Age of Onset  . Hyperlipidemia Mother   . Hypertension Father      SOCIAL HISTORY   Social History   Tobacco Use  . Smoking status: Former    Types: Cigarettes  . Smokeless tobacco: Never  Vaping Use  . Vaping Use: Never used  Substance Use Topics  . Alcohol use: Not Currently  . Drug use: Not Currently    Types: Marijuana     MEDICATIONS    Home Medication:    Current Medication:  Current Facility-Administered Medications:  .  acetaminophen (TYLENOL) tablet 650 mg, 650 mg, Oral, Q6H PRN, 650 mg at 09/04/21 1000 **OR** acetaminophen (TYLENOL) suppository 650 mg, 650 mg, Rectal, Q6H PRN, Mansy,  Jan A, MD .  Adalimumab PNKT 40 mg, 40 mg, Subcutaneous, Q14 Days, Leeroy Bock, MD, 40 mg at 09/03/21 0912 .  alum & mag hydroxide-simeth (MAALOX/MYLANTA) 200-200-20 MG/5ML suspension 30 mL, 30 mL, Oral, Once **AND** lidocaine (XYLOCAINE) 2 % viscous mouth solution 15 mL, 15 mL, Oral, TID, Karna Christmas, Vann Okerlund, MD .  azithromycin (ZITHROMAX) 500 mg in sodium chloride 0.9 % 250 mL  IVPB, 500 mg, Intravenous, Q24H, Mansy, Jan A, MD, Last Rate: 250 mL/hr at 09/03/21 2037, 500 mg at 09/03/21 2037 .  ceFEPIme (MAXIPIME) 2 g in sodium chloride 0.9 % 100 mL IVPB, 2 g, Intravenous, Q8H, Beers, Sherlynn Carbon, RPH, Last Rate: 200 mL/hr at 09/04/21 0602, 2 g at 09/04/21 0602 .  diclofenac Sodium (VOLTAREN) 1 % topical gel 2 g, 2 g, Topical, QID, Jamelle Rushing L, MD .  enoxaparin (LOVENOX) injection 40 mg, 40 mg, Subcutaneous, Q24H, Mansy, Jan A, MD, 40 mg at 09/03/21 2032 .  feeding supplement (ENSURE ENLIVE / ENSURE PLUS) liquid 237 mL, 237 mL, Oral, TID BM, Anderson, Chelsey L, MD .  fluticasone furoate-vilanterol (BREO ELLIPTA) 200-25 MCG/ACT 1 puff, 1 puff, Inhalation, Daily, 1 puff at 09/04/21 0846 **AND** umeclidinium bromide (INCRUSE ELLIPTA) 62.5 MCG/ACT 1 puff, 1 puff, Inhalation, Daily, Mansy, Jan A, MD, 1 puff at 09/04/21 0846 .  guaiFENesin-codeine 100-10 MG/5ML solution 10 mL, 10 mL, Oral, QID, Karna Christmas, Laydon Martis, MD, 10 mL at 09/04/21 0845 .  insulin glargine-yfgn (SEMGLEE) injection 10 Units, 10 Units, Subcutaneous, Daily, Leeroy Bock, MD, 10 Units at 09/04/21 0845 .  ipratropium-albuterol (DUONEB) 0.5-2.5 (3) MG/3ML nebulizer solution 3 mL, 3 mL, Nebulization, Q4H PRN, Mansy, Jan A, MD .  ipratropium-albuterol (DUONEB) 0.5-2.5 (3) MG/3ML nebulizer solution 3 mL, 3 mL, Nebulization, BID, Leeroy Bock, MD, 3 mL at 09/04/21 0720 .  magic mouthwash w/lidocaine, 5 mL, Oral, TID PRN, Leeroy Bock, MD, 5 mL at 09/04/21 0406 .  magnesium hydroxide (MILK OF MAGNESIA) suspension 30 mL, 30 mL, Oral, Daily PRN, Mansy, Jan A, MD .  methylPREDNISolone sodium succinate (SOLU-MEDROL) 40 mg/mL injection 40 mg, 40 mg, Intravenous, Q24H, Karna Christmas, Roseanne Juenger, MD, 40 mg at 09/04/21 0845 .  morphine (PF) 2 MG/ML injection 2 mg, 2 mg, Intravenous, Q4H PRN, Mansy, Jan A, MD, 2 mg at 09/03/21 2137 .  ondansetron (ZOFRAN) tablet 4 mg, 4 mg, Oral, Q6H PRN **OR** ondansetron (ZOFRAN)  injection 4 mg, 4 mg, Intravenous, Q6H PRN, Mansy, Jan A, MD .  pantoprazole (PROTONIX) injection 40 mg, 40 mg, Intravenous, Q24H, Karna Christmas, Mckinnley Cottier, MD, 40 mg at 09/04/21 0845 .  pneumococcal 20-valent conjugate vaccine (PREVNAR 20) injection 0.5 mL, 0.5 mL, Intramuscular, Tomorrow-1000, Mansy, Jan A, MD .  sucralfate (CARAFATE) 1 GM/10ML suspension 1 g, 1 g, Oral, Once, Shaune Pollack, MD .  Vitamin D (Ergocalciferol) (DRISDOL) capsule 50,000 Units, 50,000 Units, Oral, Weekly, Mansy, Vernetta Honey, MD, 50,000 Units at 09/02/21 0900    ALLERGIES   Patient has no known allergies.     REVIEW OF SYSTEMS    Review of Systems:  Gen:  Denies  fever, sweats, chills weigh loss  HEENT: Denies blurred vision, double vision, ear pain, eye pain, hearing loss, nose bleeds, sore throat Cardiac:  No dizziness, chest pain or heaviness, chest tightness,edema Resp:   reports dyspnea chronically  Gi: Denies swallowing difficulty, stomach pain, nausea or vomiting, diarrhea, constipation, bowel incontinence Gu:  Denies bladder incontinence, burning urine Ext:   Denies Joint pain, stiffness or swelling Skin: Denies  skin  rash, easy bruising or bleeding or hives Endoc:  Denies polyuria, polydipsia , polyphagia or weight change Psych:   Denies depression, insomnia or hallucinations   Other:  All other systems negative   VS: BP 122/81 (BP Location: Right Arm)   Pulse (!) 103   Temp 97.8 F (36.6 C) (Oral)   Resp 17   Ht 6\' 5"  (1.956 m)   Wt 72.6 kg   SpO2 95%   BMI 18.98 kg/m      PHYSICAL EXAM    GENERAL:NAD, no fevers, chills, no weakness no fatigue HEAD: Normocephalic, atraumatic.  EYES: Pupils equal, round, reactive to light. Extraocular muscles intact. No scleral icterus.  MOUTH: Moist mucosal membrane. Dentition intact. No abscess noted.  EAR, NOSE, THROAT: Clear without exudates. No external lesions.  NECK: Supple. No thyromegaly. No nodules. No JVD.  PULMONARY: decreased breath sounds  with mild rhonchi worse at bases bilaterally.  CARDIOVASCULAR: S1 and S2. Regular rate and rhythm. No murmurs, rubs, or gallops. No edema. Pedal pulses 2+ bilaterally.  GASTROINTESTINAL: Soft, nontender, nondistended. No masses. Positive bowel sounds. No hepatosplenomegaly.  MUSCULOSKELETAL: No swelling, clubbing, or edema. Range of motion full in all extremities.  NEUROLOGIC: Cranial nerves II through XII are intact. No gross focal neurological deficits. Sensation intact. Reflexes intact.  SKIN: No ulceration, lesions, rashes, or cyanosis. Skin warm and dry. Turgor intact.  PSYCHIATRIC: Mood, affect within normal limits. The patient is awake, alert and oriented x 3. Insight, judgment intact.       IMAGING        ASSESSMENT/PLAN   Subacute chest discomfort and severe dysphagia Secondary to forceful cough likely due to recurrent candidiasis with thrush -Failure of Diflucan p.o. and outpatient status post ENT evaluation -Have initiated micafungin IV for now -Supportive care for pneumomediastinum including reduction of shearing forces, avoid incentive spirometry, BiPAP/CPAP, flutter valve -Agree with empiric cefepime and Zithromax until microbiology returns -GI consultation-plan for EGD today -Guaifenesin with codeine solution 4 times daily -continue nasal canula O2 for supportive care to allow diffusion of nitrogen rich gas out of soft tissue   Severe dysphagia   - s/p EGD - severe narrowing of distal esophagus s/p biopsies  - CT cervical spine today -speech and swallow evaluation  Recurrent bilateral pleural effusion Previous thoracentesis with fluid pattern showing mildly ascitic eosinophilic predominant exudate consistent with rheumatoid associated effusions -Have DC'd IVF due to likely contributing to redevelopment of pleural effusion -Clinically patient is not dehydrated   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     Thank you for allowing me to participate in the care of this patient.   Patient/Family are satisfied with care plan and all questions have been answered.    Provider disclosure: Patient with at least one acute or chronic illness or injury that poses a threat to life or bodily function and is being managed actively during this encounter.  All of the below services have been performed independently by signing provider:  review of prior documentation from internal and or external health records.  Review of previous and current lab results.  Interview and comprehensive assessment during patient visit today. Review of current and previous chest radiographs/CT scans. Discussion of management and test interpretation with health care team and patient/family.  This document was prepared using Dragon voice recognition software and may include unintentional dictation errors.     Ottie Glazier, M.D.  Division of Pulmonary & Critical Care Medicine

## 2021-09-04 NOTE — Evaluation (Signed)
Objective Swallowing Evaluation: Type of Study: MBS-Modified Barium Swallow Study   Patient Details  Name: Sean [name] MRN: [id number] Date of Birth: 02-Sep-1975  Today's Date: 09/04/2021 Time: SLP Start Time (ACUTE ONLY): 1330 -SLP Stop Time (ACUTE ONLY): 1430  SLP Time Calculation (min) (ACUTE ONLY): 60 min   Past Medical History:  Past Medical History:  Diagnosis Date   H/O immunosuppressive therapy    Hyperpigmentation of skin    Hyperpigmentation rash/vesicles of the palm and face   Neutropenia (HCC)    RA (rheumatoid arthritis) ([place])    Past Surgical History:  Past Surgical History:  Procedure Laterality Date   ESOPHAGOGASTRODUODENOSCOPY N/A 09/03/2021   Procedure: ESOPHAGOGASTRODUODENOSCOPY (EGD);  Surgeon: Lesly Rubenstein, MD;  Location: [facility] ENDOSCOPY;  Service: Endoscopy;  Laterality: N/A;   HPI: Pt is a 46 y.o. 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  He denied any nausea or vomiting or abdominal pain.  Pt was admitted w/ Bilateral pneumonia  -  continue antibiotic therapy with IV cefepime and Zithromax as well as vancomycin given his immunosuppressed status;  Rheumatoid arthritis involving multiple sites with positive rheumatoid factor ([place]); Steroid-induced hyperglycemia.    CT of Chest: Extensive pneumomediastinum as previously demonstrated. Contrast  filled esophagus is patent and no extraluminal contrast  extravasation is identified.  2. Multiple focal and confluent infiltrates are scattered throughout  the lungs, suggesting multifocal pneumonia. Small bilateral pleural  effusions. Also noted results of CT of Spine: Gas in the prevertebral space, carotid  space, and deep cervical  space on the right. This is consistent gas dissecting upwards from  the known pneumomediastinum.   Subjective: pt awake, verbal and engaged fully w/ this Clinician during the MBSS following all instructions    Recommendations for follow up therapy are one component of a multi-disciplinary discharge planning process, led by the attending physician.  Recommendations may be updated based on patient status, additional functional criteria and insurance authorization.  Assessment / Plan / Recommendation     09/04/2021    4:00 PM  Clinical Impressions  Clinical Impression MBSS revealed Profound pharyngeal phase dysphagia resulting in aspiration of thin liquids, nectar thick liquids and puree via spoon. Concerning is pt's profound edema along MUCH of the posterior pharyngeal wall beginning at C2 and extending out of fluro field at clavicles(~C7-8). This edema profoundly protrudes at C4-C5 which not only impedes clearance of the bolus thru the UES into the Esophagus, but it also results in [place] COPIOUS ASPIRATION -- it does not allow effective opening of the UES to allow food/liquid boluses to pass thus aspiration despite strategies attempted resulting in MOD-SEVERE pharyngeal phase residue.   At this time, recommend NPO status with occasional ice chips post oral care to preserve swallow musculature. Given pt's chronic pneumonia and Dysphagia for ~6 months, suspect this might be part of a larger medical issue in which the recommendation is to consider an alternative source of nutrition(NGT) in order to meet nutrition needs. D/t the pharyngeal phase anatomical presentation, recommend NGT be placed under visualization. A repeat MBSS would be indicated post medical tx b/f re-establishing an oral diet. Further assessment w/ CT Soft Tissue of the Neck w/ consult to Neurology and ENT is recommended.  Discussed the above  w/ MDs, NSG, and Mother/Pt were updated.  SLP Visit Diagnosis Dysphagia,  pharyngeal phase (R13.13)  Impact on safety and function Severe aspiration risk;Risk for inadequate nutrition/hydration         09/04/2021  1330 PM  Treatment Recommendations  Treatment Recommendations Patient unable to participate in swallow therapy at this time d/t medical status; education on oral care and pleasure ice chips completed        09/04/2021    4:00 PM  Prognosis  Prognosis for Safe Diet Advancement Guarded  Barriers to Reach Goals --       09/04/2021    4:00 PM  Diet Recommendations  SLP Diet Recommendations NPO; frequent oral care  Medication Administration Via alternative means  Postural Changes Seated upright at 90 degrees w/ pleasure ice chips         09/04/2021    4:00 PM  Other Recommendations  Recommended Consults Consider ENT evaluation; Neurology evaluation; Dietician f/u  Oral Care Recommendations Oral care QID; Patient independent with oral care; frequent oral care daily and before any pleasure ice chips  Follow Up Recommendations TBD; follow physician's recommendations for discharge plan and follow up therapies; repeat MBSS prior to re-initiation of oral diet  Assistance recommended at discharge None  Functional Status Assessment --        View : No data to display.               09/04/2021  1330 PM  Oral Phase  Oral Phase [facility]       09/04/2021  1330 PM  Pharyngeal Phase  Pharyngeal Phase Impaired -- all consistencies         09/04/2021  1330 PM  Cervical Esophageal Phase   Cervical Esophageal Phase Impaired -- all consistencies            Orinda Kenner, MS, CCC-SLP Speech Language Pathologist Rehab Services; [place] [phone] (ascom) Najee Manninen 09/04/2021, 5:12 PM

## 2021-09-04 NOTE — Progress Notes (Signed)
Patient returned from swallow study. Patient now made NPO for risk of aspiration. Will keep  strict NPO.  Per speech, patient can swish and spit the lidocaine that is ordered.

## 2021-09-05 ENCOUNTER — Inpatient Hospital Stay: Payer: 59

## 2021-09-05 DIAGNOSIS — J690 Pneumonitis due to inhalation of food and vomit: Secondary | ICD-10-CM | POA: Diagnosis not present

## 2021-09-05 DIAGNOSIS — M0579 Rheumatoid arthritis with rheumatoid factor of multiple sites without organ or systems involvement: Secondary | ICD-10-CM | POA: Diagnosis not present

## 2021-09-05 DIAGNOSIS — R739 Hyperglycemia, unspecified: Secondary | ICD-10-CM | POA: Diagnosis not present

## 2021-09-05 DIAGNOSIS — R079 Chest pain, unspecified: Secondary | ICD-10-CM | POA: Diagnosis not present

## 2021-09-05 LAB — MAGNESIUM
Magnesium: 1.9 mg/dL (ref 1.7–2.4)
Magnesium: 2 mg/dL (ref 1.7–2.4)

## 2021-09-05 LAB — GLUCOSE, CAPILLARY
Glucose-Capillary: 103 mg/dL — ABNORMAL HIGH (ref 70–99)
Glucose-Capillary: 113 mg/dL — ABNORMAL HIGH (ref 70–99)
Glucose-Capillary: 177 mg/dL — ABNORMAL HIGH (ref 70–99)
Glucose-Capillary: 157 mg/dL — ABNORMAL HIGH (ref 70–99)
Glucose-Capillary: 118 mg/dL — ABNORMAL HIGH (ref 70–99)

## 2021-09-05 LAB — PHOSPHORUS
Phosphorus: 2.6 mg/dL (ref 2.5–4.6)
Phosphorus: 3.5 mg/dL (ref 2.5–4.6)

## 2021-09-05 LAB — POTASSIUM: Potassium: 4.7 mmol/L (ref 3.5–5.1)

## 2021-09-05 MED ORDER — PROSOURCE TF PO LIQD
45.0000 mL | Freq: Two times a day (BID) | ORAL | Status: DC
  Administered 2021-09-05 – 2021-09-12 (×9): 45 mL
  Filled 2021-09-05: qty 45

## 2021-09-05 MED ORDER — FREE WATER
100.0000 mL | Status: DC
  Administered 2021-09-05 – 2021-09-07 (×24): 100 mL

## 2021-09-05 MED ORDER — OSMOLITE 1.5 CAL PO LIQD
1000.0000 mL | ORAL | Status: DC
  Administered 2021-09-05: 1000 mL

## 2021-09-05 MED ORDER — HYDROMORPHONE HCL 1 MG/ML IJ SOLN
0.5000 mg | INTRAMUSCULAR | Status: DC | PRN
  Administered 2021-09-05 – 2021-09-14 (×46): 0.5 mg via INTRAVENOUS
  Filled 2021-09-05 (×47): qty 1

## 2021-09-05 NOTE — Progress Notes (Addendum)
Speech Language Pathology Treatment: Dysphagia  Patient Details Name: Sean [name] MRN: [id number] DOB: 1975/11/10 Today's Date: 09/05/2021 Time: 8811-0315 SLP Time Calculation (min) (ACUTE ONLY): 40 min  Assessment / Plan / Recommendation Clinical Impression  Met w/ both pt and Mother in room post the MBSS. Pt appeared relaxed; he verbally engaged in conversation and asked questions. Consulted w/ NSG, Team also re: results of the MBSS.  Discussed the results of the MBSS, the pharyngoesophageal phase dysphagia noted during the study and the impact of the cervical edema, present, and risk for, aspiration impacting Pulmonary status, recommendation for NPO status except for therapeutic/pleasure (SINGLE) ice chips POST thorough oral care for both pleasure and maintenance of swallowing musculature while NPO.  Briefly discussed the option of NGT for nutrition/hydration support while medical team is addressing his medical issues. Encouraged pt and Mother to discuss further w/ MD/GI, Dietician.  Viewed the MBSS w/ both pt and Mother in the room describing the pharyngoesophageal phase dysphagia and answered questions as able to. Recommended f/u w/ MDs including Neurology. Discussed the benefits of therapeutic/pleasure (SINGLE) ice chips POST thorough oral care and demonstrated use of swab/swab kit. Encouraged pt to continue to brush teeth at sink as he does baseline w/ expectoration of the toothpaste -- no swallowing of water/toothpaste.  ST services will continue to monitor pt's medical status, medical tx, and will repeat the MBSS when indicated, and prior to initiation of any oral intake/meals. Discussed the above w/ MDs/NSG who agreed. Pt and Mother in agreement as well.       HPI HPI: Pt is a 46 y.o. male with medical history significant for rheumatoid arthritis with rheumatoid lung disease on immunosuppressive therapy, with Humira as well as Plaquenil and prednisone, who presented to the  emergency room with acute onset of dyspnea with associated difficulty swallowing as well as cough productive of whitish sputum with low-grade fever without chills.  He denied any nausea or vomiting or abdominal pain.  Pt was admitted w/ Bilateral pneumonia  -  continue antibiotic therapy with IV cefepime and Zithromax as well as vancomycin given his immunosuppressed status;  Rheumatoid arthritis involving multiple sites with positive rheumatoid factor ([place]); Steroid-induced hyperglycemia.    CT of Chest: Extensive pneumomediastinum as previously demonstrated. Contrast  filled esophagus is patent and no extraluminal contrast  extravasation is identified.  2. Multiple focal and confluent infiltrates are scattered throughout  the lungs, suggesting multifocal pneumonia. Small bilateral pleural  effusions. Also noted results of CT of Spine: Gas in the prevertebral space, carotid space, and deep cervical  space on the right. This is consistent gas dissecting upwards from  the known pneumomediastinum.   MBSS completed w/ recommendation for NPO status -- see report.      SLP Plan  New goals to be determined pending instrumental study (when NEXT MBSS can be completed prior to any po initiation)      Recommendations for follow up therapy are one component of a multi-disciplinary discharge planning process, led by the attending physician.  Recommendations may be updated based on patient status, additional functional criteria and insurance authorization.    Recommendations  Diet recommendations: NPO (NGT rec'd) Medication Administration: Via alternative means                General recommendations:  (f/u by medical team) Oral Care Recommendations: Oral care QID;Oral care prior to ice chip/H20;Patient independent with oral care Follow Up Recommendations: Follow physician's recommendations for discharge plan and follow up therapies Assistance  recommended at discharge: None SLP Visit Diagnosis: Dysphagia,  pharyngeal phase (R13.13);Dysphagia, pharyngoesophageal phase (R13.14) (edema of the cervical neck) Plan: New goals to be determined pending instrumental study (when NEXT MBSS can be completed prior to any po initiation)           Sean [name]  09/04/2021, 1500 PM

## 2021-09-05 NOTE — Consult Note (Signed)
Sean [name], Sean [name] [id number] [birth year] Sean Osmond, MD  Reason for Consult: dysphagia  HPI:  Sean [name] is a 46 y.o. 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   Presented from home to the ED on 09/01/2021 with dyspnea and dysphagia/odynophagia, cough x several days worsening.  Patient recently treated with Diflucan for esophageal candidiasis infection. Thoracentesis 4/16   In the ED, it was found that they had temperature as high as 100.1, tachycardia up to 105, tachypnea up to 23, O2 sats 88% on room air, stable blood pressure.  Significant findings included blood glucose of 250 with calcium of 8.7 and ALT of 52 with albumin of 3 and total protein of 7.1.  High-sensitivity troponin was 9 and later 11.  Procalcitonin was less than 0.1 and CBC was unremarkable. Chest CTA revealed no evidence for PE.  Showed acute pneumomediastinum with extension to lower neck with no pneumonia no thorax/pleural air.  It showed small effusions less than previously seen.  It showed bilateral lower lobe volume loss/pneumonia with areas of cystic changes.  Findings could be due to infectious pneumonia and or rheumatoid  lung disease.  Chest CT without contrast revealed extensive pneumomediastinum and contrast-filled esophagus was patent with no extraluminal contrast extravasation.  It showed multiple focal and confluent infiltrates that are scattered throughout the lungs suggestive of multifocal pneumonia with small bilateral pleural effusions. Pulmonology was consulted.   They were initially treated with cefepime, vancomycin, Zithromax.    Patient was admitted to medicine service for further workup and management of pneumonia.  Pulmonary and GI have been consulted to pneumomediastinum and dysphagia.  EGD showed proximal stricture and modified barium showed esophageal dysphagia.   Patient endorses dysphagia to solids and liquids 1-2 weeks prior to admission to the ED.  No prior history of dysphagia.  Plan for NG tube for temporary feeding.    Allergies: No Known Allergies  ROS: Review of systems normal other than 12 systems except per HPI.  PMH:  Past Medical History:  Diagnosis Date   H/O immunosuppressive therapy    Hyperpigmentation of skin    Hyperpigmentation rash/vesicles of the palm and face   Neutropenia (HCC)    RA (rheumatoid arthritis) (HCC)     FH:  Family History  Problem Relation Age of Onset   Hyperlipidemia Mother    Hypertension Father     SH:  Social History   Socioeconomic History   Marital status: Single    Spouse name: Not on file   Number of children: Not on file   Years of education: Not on file   Highest education level: Not on file  Occupational History   Not on file  Tobacco Use   Smoking status: Former    Types: Cigarettes   Smokeless tobacco: Never  Vaping Use   Vaping Use: Never used  Substance and Sexual Activity   Alcohol use: Not Currently   Drug use: Not Currently    Types: Marijuana   Sexual activity: Not on file  Other Topics Concern   Not on file  Social History Narrative   Not on file   Social Determinants of Health   Financial Resource Strain: Not on file  Food Insecurity: Not on file  Transportation Needs: Not on file  Physical Activity: Not on file  Stress: Not on file  Social Connections: Not on file  Intimate Partner Violence: Not on file  PSH:  Past Surgical History:  Procedure Laterality Date   ESOPHAGOGASTRODUODENOSCOPY N/A 09/03/2021   Procedure: ESOPHAGOGASTRODUODENOSCOPY (EGD);  Surgeon: Lesly Rubenstein, MD;  Location: [facility] ENDOSCOPY;  Service: Endoscopy;  Laterality: N/A;    Physical  Exam: CN 2-12 grossly intact and symmetric. EAC/TMs normal BL. Oral cavity, lips, gums, ororpharynx normal with no masses or lesions. Skin warm and dry. Nasal cavity without polyps or  purulence. External nose and ears without masses or lesions. EOMI, PERRLA. Neck supple with no masses or lesions. No lymphadenopathy palpated. Thyroid normal with no masses.   A/P: Sean [name] is a 46 y.o. male with dysphagia.  GI and pulmonary notes reviewed.  CT spine results reviewed.  Speech pathology notes reviewed. Defer to GI for management of esophageal stricture. No ENT intervention needed at this time. Defer to speech for swallow therapy.   Sean [name] 09/05/2021 1:32 PM

## 2021-09-05 NOTE — Progress Notes (Signed)
PROGRESS NOTE  Sean [name]    DOB: 1976/02/14, 46 y.o.  WUJ:[id number]    Code Status: Full Code   DOA: 09/01/2021   LOS: 4   Brief hospital course  Sean [name] is a 46 y.o. 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  They presented from home to the ED on 09/01/2021 with dyspnea and dysphagia/odynophagia, cough x several days worsening.  Patient recently treated with Diflucan for esophageal candidiasis infection. Thoracentesis 4/16  In the ED, it was found that they had temperature as high as 100.1, tachycardia up to 105, tachypnea up to 23, O2 sats 88% on room air, stable blood pressure.  Significant findings included blood glucose of 250 with calcium of 8.7 and ALT of 52 with albumin of 3 and total protein of 7.1.  High-sensitivity troponin was 9 and later 11.  Procalcitonin was less than 0.1 and CBC was unremarkable. Chest CTA revealed no evidence for PE.  Showed acute pneumomediastinum with extension to lower neck with no pneumonia no thorax/pleural air.  It showed small effusions less than previously seen.  It showed bilateral lower lobe volume loss/pneumonia with areas of cystic changes.  Findings could be due to infectious pneumonia and or rheumatoid  lung disease.  Chest CT without contrast revealed extensive pneumomediastinum and contrast-filled esophagus was patent with no extraluminal contrast extravasation.  It showed multiple focal and confluent infiltrates that are scattered throughout the lungs suggestive of multifocal pneumonia with small bilateral pleural effusions. Pulmonology was consulted.  They were initially treated with cefepime, vancomycin, Zithromax.   Patient was admitted to medicine service for further workup and management of pneumonia as outlined in detail below.  09/05/21 -stable, improved  Assessment & Plan  Principal Problem:    Bilateral pneumonia Active Problems:   Rheumatoid arthritis involving multiple sites with positive rheumatoid factor (HCC)   Steroid-induced hyperglycemia   Chest pain  Bilateral pneumonia  pneumomediastinum- suspect related to coughing. stable on room air. Stable repeat xray 5/25. Cervical spine CT 5/26 shows gas in Prevertebral space, carotid space and deep cervical space. - neurosurgery consulted and recommended conservative management at this time.  -Pulmonology consulted.  Provider is familiar with the patient's medical history.  Appreciate recommendations and care -Continue cefepime, Zithromax until 5/28 -Antitussive's as needed - supplemental oxygen   Interstitial lung disease  rheumatoid arthritis involving multiple sites with positive rheumatoid factor (HCC)- humira dose 5/25, brought in from home. -Continue Humira, prednisone taper -Pulmonology has discontinued Plaquenil due to side effect profile, and cellcept -Low threshold to re-image chest if worsening pain or respirations due to concern of expanding pneumo mediastinum - votlaren gel, analgesia PRN  Odynophagia  dysphagia- initially thought to be related to Candida esophagitis in setting of immune suppression. EGD 5/25 unrevealing of yeast but biopsies were taken to r/o CMV or other. Patient states that magic mouthwash helping. His sternal pain has greatly improved since becoming NPO.  -Analgesia as needed -GI consulted, appreciate recommendations  -EGD 5/25 showed stricture of upper esophageal opening, biopsy results pending  - consider stricture dilation  - SLP evaluated with swallow study 5/26 and recommended NPO other than ice chips due to severe aspiration.  - IR consulted for enteral tube placement 5/27 for nutrition - nutrition consulted for enteral tube feedings - ENT consulted for evaluation - IV PPI - magic mouthwash with lidocaine TID PRN   Steroid-induced hyperglycemia - started  lantus 10units. Held today  while NPO and blood sugars borderline low.  Body mass index is 18.98 kg/m.  VTE ppx: enoxaparin (LOVENOX) injection 40 mg Start: 09/01/21 2200   Diet:     Diet   Diet NPO time specified   Consultants: Pulmonology GI IR Neurosurgery ENT  Subjective 09/05/21    Pt reports feeling improvement in his sternal pain since being NPO. Continues to deny respiratory complaints. He is agreeable to NG tube placement and has questions about PEG tube which I recommended against at this time.    Objective   Vitals:   09/04/21 1903 09/04/21 2006 09/04/21 2311 09/05/21 0302  BP: 118/78  122/81 128/82  Pulse: 88  92 91  Resp: Temp: 97.6 F (36.4 C)  98.2 F (36.8 C) 98.6 F (37 C)  TempSrc: Oral  Oral   SpO2: 100% 94% 98% 97%  Weight:      Height:        Intake/Output Summary (Last 24 hours) at 09/05/2021 0757 Last data filed at 09/05/2021 0356 Gross per 24 hour  Intake 711.26 ml  Output 300 ml  Net 411.26 ml    Filed Weights   09/01/21 1223 09/03/21 1525  Weight: 72.6 kg 72.6 kg     Physical Exam:  General: awake, alert, mild distress HEENT: atraumatic, clear conjunctiva, anicteric sclera, MMM, hearing grossly normal.  Pharynx erythematous Respiratory: normal respiratory effort. Crackles diffusely. Worse in bases. Cardiovascular: quick capillary refill Gastrointestinal: soft, NT, ND Nervous: A&O x3. no gross focal neurologic deficits, normal speech Extremities: moves all equally, no edema, normal tone Skin: dry, intact, normal temperature, normal color. No rashes, lesions or ulcers on exposed skin Psychiatry: normal mood, congruent affect  Labs   I have personally reviewed the following labs and imaging studies CBC    Component Value Date/Time   WBC 10.3 09/02/2021 0612   RBC 3.93 (L) 09/02/2021 0612   HGB 12.3 (L) 09/02/2021 0612   HGB 13.2 07/26/2021 0905   HCT 36.3 (L) 09/02/2021 0612   HCT 39.2 07/26/2021 0905   PLT 186 09/02/2021 0612   PLT 149  (L) 07/26/2021 0905   MCV 92.4 09/02/2021 0612   MCV 92 07/26/2021 0905   MCH 31.3 09/02/2021 0612   MCHC 33.9 09/02/2021 0612   RDW 12.8 09/02/2021 0612   RDW 12.9 07/26/2021 0905   LYMPHSABS 0.8 07/26/2021 0905   MONOABS 0.4 07/25/2021 1608   EOSABS 0.1 07/26/2021 0905   BASOSABS 0.0 07/26/2021 0905      Latest Ref Rng & Units 09/03/2021    8:06 AM 09/02/2021    1:18 PM 09/02/2021    6:12 AM  BMP  Glucose 70 - 99 mg/dL 409   811   914    BUN 6 - 20 mg/dL Creatinine 0.61 - 1.24 mg/dL 7.82   9.56   2.13    Sodium 135 - 145 mmol/L 132   128   133    Potassium 3.5 - 5.1 mmol/L 3.8   4.2   4.7    Chloride 98 - 111 mmol/L 104   98   103    CO2 22 - 32 mmol/L Calcium 8.9 - 10.3 mg/dL 8.7   8.4   8.3      CT CERVICAL SPINE WO CONTRAST  Result Date: 09/04/2021 CLINICAL DATA:  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 EXAM: CT CERVICAL SPINE WITHOUT CONTRAST TECHNIQUE: Multidetector CT imaging of the cervical spine was performed without intravenous contrast. Multiplanar CT image reconstructions were also generated. RADIATION DOSE REDUCTION: This exam was performed according to the departmental dose-optimization program which includes automated exposure control, adjustment of the mA and/or kV according to patient size and/or use of iterative reconstruction technique. COMPARISON:  CT chest without contrast 09/01/2021 FINDINGS: Alignment: No significant listhesis is present. Slight reversal of the normal cervical lordosis noted. Skull base and vertebrae: Craniocervical junction is within normal limits. No acute or healing fractures are present. No focal osseous lesions are present. Soft tissues and spinal canal: Gas is present in the prevertebral space, consistent with known pneumomediastinum. Some gas dissects into the carotid and deep cervical space on the right. No fluid collection present. No acute trauma. Spinal canal is within normal limits. Disc levels:  No  significant focal stenosis. Upper chest: The lung apices are clear.  Pneumomediastinum noted. IMPRESSION: 1. Gas in the prevertebral space, carotid space, and deep cervical space on the right. This is consistent gas dissecting upwards from the known pneumomediastinum. 2. No acute or healing fractures. 3. No significant focal stenosis. Electronically Signed   By: Marin Roberts M.D.   On: 09/04/2021 12:13   DG Chest Port 1 View  Result Date: 09/03/2021 CLINICAL DATA:  Monitoring pneumomediastinum expansion as patient endorses increased pain/pressure EXAM: PORTABLE CHEST 1 VIEW COMPARISON:  Chest radiograph May 23, 23. FINDINGS: Similar bibasilar opacities. Similar pneumomediastinum with gas in the partially visualized lower neck. Similar cardiomediastinal silhouette. Small bilateral pleural effusions. No definite pneumothorax. IMPRESSION: Similar bibasilar opacities, pneumomediastinum, and small bilateral pleural effusions. Electronically Signed   By: Feliberto Harts M.D.   On: 09/03/2021 10:29    Disposition Plan & Communication  Patient status: Inpatient  Admitted From: Home Planned disposition location: Home Anticipated discharge date: 5/29 pending completing workup/treatment  Family Communication: girlfriend at bedside   Author: Leeroy Bock, DO [facility] 09/05/2021, 7:57 AM   Available by Epic secure chat 7AM-7PM. If 7PM-7AM, please contact night-coverage.  [facility] contact information found on [email or web address].

## 2021-09-05 NOTE — Procedures (Signed)
Successful image guided Dobhoff placement via right nostril with tip terminating in gastric antrum. Tube affixed to nostril at 66 cm mark with tape. Tube flushed easily with 10 cc NS.  Tube may be used immediately.  Lynnette Caffey, PA-C

## 2021-09-05 NOTE — Progress Notes (Signed)
Nutrition Follow-up  DOCUMENTATION CODES:   Severe malnutrition in context of chronic illness  INTERVENTION:   Tube Feeding via NG: Radiology to place today Osmolite 1.5 at 60 ml/hr Begin TF at rate of 20 ml/hr; titrate by 10 mL q 12 hours until goal rate of 60 ml/hr Pro-Source TF 45 mL BID Goal regimen provides 112 g of protein, 2240 kcals and 1094 mL of free water  Pt not receiving any IV hydration at this time; Add free water flush of 100 ml q 2 hours; total of 2294 mL of free water  Consider adding sliding scale insulin coverage with initiation of TF. Currently no orders for insulin, pt on solumedrol. Reached out to attending to discuss. MD wanting to hold addition of sliding scale for now; orders to check CBGs q 4 hours ordered via Adult TF protocol  Monitor magnesium, potassium, and phosphorus BID for at least 3 days, MD to replete as needed, as pt is at risk for refeeding syndrome given poor po intake, malnutrition.  D/C Ensure Enlive TID given NPO status   NUTRITION DIAGNOSIS:   Severe Malnutrition related to chronic illness (RA, rheumatoid lung disease) as evidenced by severe fat depletion, mild muscle depletion, moderate muscle depletion, percent weight loss.  Being addressed via TF   GOAL:   Patient will meet greater than or equal to 90% of their needs  Progressing  MONITOR:   PO intake, Supplement acceptance, Diet advancement, Labs, Weight trends  REASON FOR ASSESSMENT:   Malnutrition Screening Tool    ASSESSMENT:   Pt admitted with chest pain d/t bilateral PNA. 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  5/25 EGD: stricture of upper esophageal opening, biopsy pending 5/26 SLP recommended NPO due to severe aspiration 5/27 Radiology consulted for NG tube  No BMP today, no mag, no phosphorus. RD to order potassium, magnesium and phosphorus for today and repeat orders post TF initiation given  refeeding risk  CBGs had been in 200s previously, 103-113 in last 24 hours. Pt is on solumedrol. Currently no insulin ordered, not even sliding scale  Labs: last BMP 5/25, CBGs 103-113, sodium 132 (5/25) Meds: solumedrol, Vit D   Diet Order:   Diet Order             Diet NPO time specified  Diet effective now                   EDUCATION NEEDS:   Education needs have been addressed  Skin:  Skin Assessment: Reviewed RN Assessment  Last BM:  5/22  Height:   Ht Readings from Last 1 Encounters:  09/03/21 6\' 5"  (1.956 m)    Weight:   Wt Readings from Last 1 Encounters:  09/03/21 72.6 kg    BMI:  Body mass index is 18.98 kg/m.  Estimated Nutritional Needs:   Kcal:  2200-2400 kcals  Protein:  110-120 g  Fluid:  >/= 2 L   Romelle Starcher MS, RDN, LDN, CNSC Registered Dietitian III Clinical Nutrition RD Pager and On-Call Pager Number Located in [place]

## 2021-09-06 ENCOUNTER — Inpatient Hospital Stay: Payer: 59

## 2021-09-06 DIAGNOSIS — M0579 Rheumatoid arthritis with rheumatoid factor of multiple sites without organ or systems involvement: Secondary | ICD-10-CM | POA: Diagnosis not present

## 2021-09-06 DIAGNOSIS — R739 Hyperglycemia, unspecified: Secondary | ICD-10-CM | POA: Diagnosis not present

## 2021-09-06 DIAGNOSIS — R079 Chest pain, unspecified: Secondary | ICD-10-CM | POA: Diagnosis not present

## 2021-09-06 DIAGNOSIS — T380X5A Adverse effect of glucocorticoids and synthetic analogues, initial encounter: Secondary | ICD-10-CM | POA: Diagnosis not present

## 2021-09-06 LAB — GLUCOSE, CAPILLARY
Glucose-Capillary: 118 mg/dL — ABNORMAL HIGH (ref 70–99)
Glucose-Capillary: 167 mg/dL — ABNORMAL HIGH (ref 70–99)
Glucose-Capillary: 194 mg/dL — ABNORMAL HIGH (ref 70–99)
Glucose-Capillary: 284 mg/dL — ABNORMAL HIGH (ref 70–99)
Glucose-Capillary: 296 mg/dL — ABNORMAL HIGH (ref 70–99)

## 2021-09-06 LAB — POTASSIUM: Potassium: 4.2 mmol/L (ref 3.5–5.1)

## 2021-09-06 LAB — PHOSPHORUS
Phosphorus: 3.2 mg/dL (ref 2.5–4.6)
Phosphorus: 3 mg/dL (ref 2.5–4.6)

## 2021-09-06 LAB — MAGNESIUM
Magnesium: 1.9 mg/dL (ref 1.7–2.4)
Magnesium: 1.9 mg/dL (ref 1.7–2.4)

## 2021-09-06 MED ORDER — MAGIC MOUTHWASH W/LIDOCAINE
5.0000 mL | Freq: Three times a day (TID) | ORAL | Status: AC | PRN
  Administered 2021-09-07 (×2): 5 mL via ORAL
  Filled 2021-09-06 (×3): qty 5

## 2021-09-06 MED ORDER — INSULIN ASPART 100 UNIT/ML IJ SOLN
0.0000 [IU] | Freq: Three times a day (TID) | INTRAMUSCULAR | Status: DC
  Administered 2021-09-06: 5 [IU] via SUBCUTANEOUS
  Administered 2021-09-07 – 2021-09-08 (×4): 3 [IU] via SUBCUTANEOUS
  Administered 2021-09-08: 5 [IU] via SUBCUTANEOUS
  Administered 2021-09-08: 3 [IU] via SUBCUTANEOUS
  Administered 2021-09-09: 2 [IU] via SUBCUTANEOUS
  Administered 2021-09-09: 5 [IU] via SUBCUTANEOUS
  Administered 2021-09-09: 2 [IU] via SUBCUTANEOUS
  Administered 2021-09-10 – 2021-09-11 (×2): 3 [IU] via SUBCUTANEOUS
  Administered 2021-09-11: 2 [IU] via SUBCUTANEOUS
  Administered 2021-09-11: 1 [IU] via SUBCUTANEOUS
  Filled 2021-09-06 (×14): qty 1

## 2021-09-06 NOTE — Progress Notes (Signed)
PROGRESS NOTE  Sean [name]    DOB: [birth year], 46 y.o.  ZOX:[id number]    Code Status: Full Code   DOA: 09/01/2021   LOS: 5   Brief hospital course  Sean [name] is a 46 y.o. 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  They presented from home to the ED on 09/01/2021 with dyspnea and dysphagia/odynophagia, cough x several days worsening.  Patient recently treated with Diflucan for esophageal candidiasis infection. Thoracentesis 4/16  In the ED, it was found that they had temperature as high as 100.1, tachycardia up to 105, tachypnea up to 23, O2 sats 88% on room air, stable blood pressure.  Significant findings included blood glucose of 250 with calcium of 8.7 and ALT of 52 with albumin of 3 and total protein of 7.1.  High-sensitivity troponin was 9 and later 11.  Procalcitonin was less than 0.1 and CBC was unremarkable. Chest CTA revealed no evidence for PE.  Showed acute pneumomediastinum with extension to lower neck with no pneumonia no thorax/pleural air.  It showed small effusions less than previously seen.  It showed bilateral lower lobe volume loss/pneumonia with areas of cystic changes.  Findings could be due to infectious pneumonia and or rheumatoid  lung disease.  Chest CT without contrast revealed extensive pneumomediastinum and contrast-filled esophagus was patent with no extraluminal contrast extravasation.  It showed multiple focal and confluent infiltrates that are scattered throughout the lungs suggestive of multifocal pneumonia with small bilateral pleural effusions. Pulmonology was consulted.  They were initially treated with cefepime, vancomycin, Zithromax.   Patient was admitted to medicine service for further workup and management of pneumonia as outlined in detail below.  5/25- EGD 5/26- swallow study positive for extreme aspiration. Made  NPO 5/27- Dobhoff placed for enteral feed. ENT reevaluated.  09/06/21 -stable, improved  Assessment & Plan  Principal Problem:   Bilateral pneumonia Active Problems:   Rheumatoid arthritis involving multiple sites with positive rheumatoid factor (HCC)   Steroid-induced hyperglycemia   Chest pain  Bilateral pneumonia  pneumomediastinum- suspect related to coughing. stable on room air. Stable repeat xray 5/25. Cervical spine CT 5/26 shows gas in Prevertebral space, carotid space and deep cervical space. - neurosurgery consulted and recommended conservative management at this time.  -Pulmonology consulted.  Provider is familiar with the patient's medical history.  Appreciate recommendations and care -Continue cefepime, Zithromax until 5/28 -Antitussive's as needed - supplemental oxygen   Interstitial lung disease  rheumatoid arthritis involving multiple sites with positive rheumatoid factor (HCC)- humira dose 5/25, brought in from home. -Continue Humira, prednisone taper -Pulmonology has discontinued Plaquenil due to side effect profile, and cellcept -Low threshold to re-image chest if worsening pain or respirations due to concern of expanding pneumo mediastinum - votlaren gel, analgesia PRN  Odynophagia  dysphagia- initially thought to be related to Candida esophagitis in setting of immune suppression. EGD 5/25 unrevealing of yeast but biopsies were taken to r/o CMV or other. Patient states that magic mouthwash helping. His sternal pain has greatly improved since becoming NPO.  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 Upon further discussion, they recommended swallow therapy - SLP evaluated  with  swallow study 5/26 and recommended NPO other than ice chips due to severe aspiration.  - IR consulted for enteral tube placement 5/27 for nutrition - nutrition consulted for enteral tube feedings - IV PPI - magic mouthwash with lidocaine TID PRN   Steroid-induced hyperglycemia - sliding scale restarted now with enteral feeds  Body mass index is 19.08 kg/m.  VTE ppx: enoxaparin (LOVENOX) injection 40 mg Start: 09/01/21 2200   Diet: NG tube feeds per dietition    Diet   Diet NPO time specified   Consultants: Pulmonology GI IR Neurosurgery ENT  Subjective 09/06/21    Pt reports doing alright today. Still has soreness in throat. Seems to be getting better. Tolerating NG tube well. Wants to know when he can leave the hospital.   Objective   Vitals:   09/06/21 0011 09/06/21 0331 09/06/21 0500 09/06/21 0800  BP: 93/80 105/72  115/76  Pulse: 86 79  82  Resp: Temp: 99.3 F (37.4 C) 98.1 F (36.7 C)  98.8 F (37.1 C)  TempSrc: Oral Oral  Oral  SpO2: 98% 97%  98%  Weight:   73 kg   Height:        Intake/Output Summary (Last 24 hours) at 09/06/2021 1145 Last data filed at 09/05/2021 1930 Gross per 24 hour  Intake 314.67 ml  Output 150 ml  Net 164.67 ml   Filed Weights   09/01/21 1223 09/03/21 1525 09/06/21 0500  Weight: 72.6 kg 72.6 kg 73 kg     Physical Exam:  General: awake, alert, mild distress HEENT: atraumatic, clear conjunctiva, anicteric sclera, MMM, hearing grossly normal.  NG tube in place Respiratory: normal respiratory effort. CTAB Cardiovascular: quick capillary refill Nervous: A&O x3. no gross focal neurologic deficits, normal speech Extremities: moves all equally, no edema, normal tone Skin: dry, intact, normal temperature, normal color. No rashes, lesions or ulcers on exposed skin Psychiatry: normal mood, congruent affect  Labs   I have personally reviewed the following labs and imaging studies CBC    Component Value Date/Time    WBC 10.3 09/02/2021 0612   RBC 3.93 (L) 09/02/2021 0612   HGB 12.3 (L) 09/02/2021 0612   HGB 13.2 07/26/2021 0905   HCT 36.3 (L) 09/02/2021 0612   HCT 39.2 07/26/2021 0905   PLT 186 09/02/2021 0612   PLT 149 (L) 07/26/2021 0905   MCV 92.4 09/02/2021 0612   MCV 92 07/26/2021 0905   MCH 31.3 09/02/2021 0612   MCHC 33.9 09/02/2021 0612   RDW 12.8 09/02/2021 0612   RDW 12.9 07/26/2021 0905   LYMPHSABS 0.8 07/26/2021 0905   MONOABS 0.4 07/25/2021 1608   EOSABS 0.1 07/26/2021 0905   BASOSABS 0.0 07/26/2021 0905      Latest Ref Rng & Units 09/06/2021    3:51 AM 09/05/2021   11:45 AM 09/03/2021    8:06 AM  BMP  Glucose 70 - 99 mg/dL   161    BUN 6 - 20 mg/dL   11    Creatinine 0.96 - 1.24 mg/dL   0.45    Sodium 409 - 145 mmol/L   132    Potassium 3.5 - 5.1 mmol/L 4.2   4.7   3.8    Chloride 98 - 111 mmol/L   104    CO2 22 - 32 mmol/L   21    Calcium 8.9 - 10.3 mg/dL   8.7      CT CERVICAL SPINE WO CONTRAST  Result  Date: 09/04/2021 CLINICAL DATA:  Neck pain 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 EXAM: CT CERVICAL SPINE WITHOUT CONTRAST TECHNIQUE: Multidetector CT imaging of the cervical spine was performed without intravenous contrast. Multiplanar CT image reconstructions were also generated. RADIATION DOSE REDUCTION: This exam was performed according to the departmental dose-optimization program which includes automated exposure control, adjustment of the mA and/or kV according to patient size and/or use of iterative reconstruction technique. COMPARISON:  CT chest without contrast 09/01/2021 FINDINGS: Alignment: No significant listhesis is present. Slight reversal of the normal cervical lordosis noted. Skull base and vertebrae: Craniocervical junction is within normal limits. No acute or healing fractures are present. No focal osseous lesions are present. Soft tissues and spinal canal: Gas is present in the prevertebral space, consistent with known pneumomediastinum. Some gas dissects into the  carotid and deep cervical space on the right. No fluid collection present. No acute trauma. Spinal canal is within normal limits. Disc levels:  No significant focal stenosis. Upper chest: The lung apices are clear.  Pneumomediastinum noted. IMPRESSION: 1. Gas in the prevertebral space, carotid space, and deep cervical space on the right. This is consistent gas dissecting upwards from the known pneumomediastinum. 2. No acute or healing fractures. 3. No significant focal stenosis. Electronically Signed   By: Marin Robertshristopher  Mattern M.D.   On: 09/04/2021 12:13   DG Basil Dessaso G Tube Plc W/Fl W/Rad  Result Date: 09/05/2021 INDICATION: RA with rheumatoid lung disease, aspiration pneumonia, pneumomediastinum without extravasation of esophageal contrast on CT chest 09/01/21. Request for image guided Dobhoff tube placement due to pharyngeal edema impairing UES function on MBSS yesterday with speech pathology. EXAM: NASO G TUBE PLACEMENT WITH FL AND WITH RAD COMPARISON:  CT chest w/o contrast 09/01/21 CONTRAST:  None FLUOROSCOPY TIME:  Radiation Exposure Index (as provided by the fluoroscopic device): 9.90 MGy Kerma COMPLICATIONS: None immediate PROCEDURE: The Dobhoff tube was lubricated with viscous lidocaine inserted into the right nostril. Under intermittent fluoroscopic guidance, the Dobhoff tube was advanced to the stomach with tip terminating in the gastric antrum. A spot fluoroscopic image was saved for documentation purposes. The tube was flushed easily with 10 cc 0.9% normal saline and affixed to the patient's nose with tape at 66 cm mark. The patient tolerated the procedure well without immediate postprocedural complication. IMPRESSION: Successful fluoroscopic guided placement of Dobhoff tube with tip terminating within the gastric antrum. The tube is ready for immediate use. Performed by Lynnette CaffeyShannon Watterson, PA-C and supervised by Signa Kellaylor Stroud, MD Electronically Signed   By: Signa Kellaylor  Stroud M.D.   On: 09/05/2021 15:55     Disposition Plan & Communication  Patient status: Inpatient  Admitted From: Home Planned disposition location: Home Anticipated discharge date: 5/29 pending completing workup/treatment  Family Communication: girlfriend at bedside   Author: Leeroy Bockhelsey L Rindi Beechy, DO [facility] 09/06/2021, 11:45 AM   Available by Epic secure chat 7AM-7PM. If 7PM-7AM, please contact night-coverage.  [facility] contact information found on [email or web address].

## 2021-09-06 NOTE — Progress Notes (Signed)
PULMONOLOGY         Date: 09/06/2021,   MRN# [id number] ISON WICHMANN [birth year]     AdmissionWeight: 72.6 kg                 CurrentWeight: 73 kg  Referring provider: Dr. Dareen Piano   CHIEF COMPLAINT:   Forceful cough with pneumomediastinum   HISTORY OF PRESENT ILLNESS   This is a 46 pleasant patient with a history of severe rheumatoid arthritis, seen previously while hospitalized due to bilateral multifocal pneumonia with treatment for community-acquired pneumonia and resolution.  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  Patient has been progressively dyspneic with reduction in spirometry performed in clinic via PFT.  Additional evaluation with rheumatology status post initiation of Humira infusions as well as CellCept and chronic prednisone use at higher dose starting 60 mg with slow taper.  On previous admission he also had moderate pleural effusion with resolution postthoracentesis and clinical improvement patient develops worsening symptoms when tapering below 45 mg daily including scaling of skin fingers and skin redness and erythema around nasolabial folds and worsening dyspnea and stiffness.  He came in on this admission with chest discomfort and severe dysphagia with cough.  He was seen in clinic and noted to have severe oral thrush status post ear nose and throat evaluation with course of Diflucan and partial resolution.  On examination there is still erythema in the upper airway and lower airway as well as remnants of thrush visible in the lower airway.  CT chest independently reviewed with findings of pneumomediastinum without esophageal perforation.  Overall interval improvement in lung parenchyma bilaterally.  Pulmonary critical care consultation placed for additional evaluation management   09/03/21-  patient seen at bedside. He has continued dysphagia worse  with pill swallowing. I have changed most of his PO meds to IV for now. Discussed medical plan with family and patient at bedside. Reviewed case with GI today there is plan for EGD. Reviewed CXR this am, mild b/l pleural effusions worse from prior but not clinicallyh significant, pneumonmediastinum is stable. Will continue to follow.   09/04/21 - patient seems improved.  He continues to have pain with swallowing. 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 There were biopsies done. We will obtain CT neck to eval cervical spine  09/06/21-  patient seen and evaluated at bedside.  He reports having much less throat pain.  He is noticing less skin breakdown and irritation around hands/fingers.  He is feeling better and breathing non labored.  He has repeat Cxr today.  Continue current management.    PAST MEDICAL HISTORY   Past Medical History:  Diagnosis Date  . H/O immunosuppressive therapy   . Hyperpigmentation of skin    Hyperpigmentation rash/vesicles of the palm and face  . Neutropenia (HCC)   . RA (rheumatoid arthritis) (HCC)      SURGICAL HISTORY   Past Surgical History:  Procedure Laterality Date  . ESOPHAGOGASTRODUODENOSCOPY N/A 09/03/2021   Procedure: ESOPHAGOGASTRODUODENOSCOPY (EGD);  Surgeon: Regis Bill, MD;  Location: [facility] ENDOSCOPY;  Service: Endoscopy;  Laterality: N/A;     FAMILY HISTORY   Family History  Problem Relation Age of Onset  . Hyperlipidemia Mother   . Hypertension Father      SOCIAL HISTORY   Social History   Tobacco Use  . Smoking status: Former    Types: Cigarettes  . Smokeless tobacco: Never  Vaping Use  . Vaping Use: Never used  Substance Use Topics  . Alcohol use: Not Currently  . Drug use: Not Currently    Types: Marijuana     MEDICATIONS    Home Medication:    Current Medication:  Current Facility-Administered Medications:  .  0.9 %  sodium chloride infusion, , Intravenous, PRN, Leeroy Bock, MD, Stopped at  09/04/21 1956 .  acetaminophen (TYLENOL) tablet 650 mg, 650 mg, Oral, Q6H PRN, 650 mg at 09/04/21 1000 **OR** acetaminophen (TYLENOL) suppository 650 mg, 650 mg, Rectal, Q6H PRN, Mansy, Jan A, MD .  Adalimumab PNKT 40 mg, 40 mg, Subcutaneous, Q14 Days, Leeroy Bock, MD, 40 mg at 09/03/21 0912 .  ceFEPIme (MAXIPIME) 2 g in sodium chloride 0.9 % 100 mL IVPB, 2 g, Intravenous, Q8H, Leeroy Bock, MD, Last Rate: 200 mL/hr at 09/06/21 0509, 2 g at 09/06/21 0509 .  diclofenac Sodium (VOLTAREN) 1 % topical gel 2 g, 2 g, Topical, QID, Leeroy Bock, MD, 2 g at 09/06/21 4315 .  enoxaparin (LOVENOX) injection 40 mg, 40 mg, Subcutaneous, Q24H, Mansy, Jan A, MD, 40 mg at 09/05/21 2127 .  feeding supplement (OSMOLITE 1.5 CAL) liquid 1,000 mL, 1,000 mL, Per Tube, Continuous, Leeroy Bock, MD, Last Rate: 20 mL/hr at 09/05/21 1542, 1,000 mL at 09/05/21 1542 .  feeding supplement (PROSource TF) liquid 45 mL, 45 mL, Per Tube, BID, Leeroy Bock, MD, 45 mL at 09/06/21 0831 .  fluticasone furoate-vilanterol (BREO ELLIPTA) 200-25 MCG/ACT 1 puff, 1 puff, Inhalation, Daily, 1 puff at 09/04/21 0846 **AND** umeclidinium bromide (INCRUSE ELLIPTA) 62.5 MCG/ACT 1 puff, 1 puff, Inhalation, Daily, Mansy, Jan A, MD, 1 puff at 09/04/21 0846 .  free water 100 mL, 100 mL, Per Tube, Q2H, Leeroy Bock, MD, 100 mL at 09/06/21 0833 .  HYDROmorphone (DILAUDID) injection 0.5 mg, 0.5 mg, Intravenous, Q4H PRN, Leeroy Bock, MD, 0.5 mg at 09/06/21 1126 .  insulin aspart (novoLOG) injection 0-9 Units, 0-9 Units, Subcutaneous, TID WC, Anderson, Chelsey L, MD .  ipratropium-albuterol (DUONEB) 0.5-2.5 (3) MG/3ML nebulizer solution 3 mL, 3 mL, Nebulization, Q4H PRN, Mansy, Jan A, MD .  magnesium hydroxide (MILK OF MAGNESIA) suspension 30 mL, 30 mL, Oral, Daily PRN, Mansy, Jan A, MD .  methylPREDNISolone sodium succinate (SOLU-MEDROL) 40 mg/mL injection 40 mg, 40 mg, Intravenous, Q24H, Karna Christmas, Khalani Novoa,  MD, 40 mg at 09/06/21 4008 .  ondansetron (ZOFRAN) tablet 4 mg, 4 mg, Oral, Q6H PRN **OR** ondansetron (ZOFRAN) injection 4 mg, 4 mg, Intravenous, Q6H PRN, Mansy, Jan A, MD .  pantoprazole (PROTONIX) injection 40 mg, 40 mg, Intravenous, Q24H, Karna Christmas, Milas Schappell, MD, 40 mg at 09/06/21 0828 .  pneumococcal 20-valent conjugate vaccine (PREVNAR 20) injection 0.5 mL, 0.5 mL, Intramuscular, Tomorrow-1000, Mansy, Jan A, MD .  sucralfate (CARAFATE) 1 GM/10ML suspension 1 g, 1 g, Oral, Once, Shaune Pollack, MD .  Vitamin D (Ergocalciferol) (DRISDOL) capsule 50,000 Units, 50,000 Units, Oral, Weekly, Mansy, Vernetta Honey, MD, 50,000 Units at 09/02/21 0900    ALLERGIES   Patient has no known allergies.     REVIEW OF SYSTEMS    Review of Systems:  Gen:  Denies  fever, sweats, chills weigh loss  HEENT: Denies blurred vision, double vision, ear pain, eye pain, hearing loss, nose bleeds, sore throat Cardiac:  No dizziness, chest pain or heaviness, chest tightness,edema Resp:   reports dyspnea chronically  Gi: Denies swallowing difficulty, stomach pain, nausea or vomiting, diarrhea, constipation, bowel incontinence Gu:  Denies bladder incontinence, burning urine Ext:   Denies Joint pain, stiffness or swelling Skin: Denies  skin rash, easy bruising or bleeding or hives Endoc:  Denies polyuria, polydipsia , polyphagia or weight change Psych:   Denies depression, insomnia or hallucinations   Other:  All other systems negative   VS: BP 115/76 (BP Location: Right Arm)   Pulse 82   Temp 98.8 F (37.1 C) (Oral)   Resp 18   Ht  (1.956 m)   Wt 73 kg   SpO2 98%   BMI 19.08 kg/m      PHYSICAL EXAM    GENERAL:NAD, no fevers, chills, no weakness no fatigue HEAD: Normocephalic, atraumatic.  EYES: Pupils equal, round, reactive to light. Extraocular muscles intact. No scleral icterus.  MOUTH: Moist mucosal membrane. Dentition intact. No abscess noted.  EAR, NOSE, THROAT: Clear without exudates. No  external lesions.  NECK: Supple. No thyromegaly. No nodules. No JVD.  PULMONARY: decreased breath sounds with mild rhonchi worse at bases bilaterally.  CARDIOVASCULAR: S1 and S2. Regular rate and rhythm. No murmurs, rubs, or gallops. No edema. Pedal pulses 2+ bilaterally.  GASTROINTESTINAL: Soft, nontender, nondistended. No masses. Positive bowel sounds. No hepatosplenomegaly.  MUSCULOSKELETAL: No swelling, clubbing, or edema. Range of motion full in all extremities.  NEUROLOGIC: Cranial nerves II through XII are intact. No gross focal neurological deficits. Sensation intact. Reflexes intact.  SKIN: No ulceration, lesions, rashes, or cyanosis. Skin warm and dry. Turgor intact.  PSYCHIATRIC: Mood, affect within normal limits. The patient is awake, alert and oriented x 3. Insight, judgment intact.       IMAGING        ASSESSMENT/PLAN   Subacute chest discomfort and severe dysphagia Secondary to forceful cough likely due to recurrent candidiasis with thrush -Failure of Diflucan p.o. and outpatient status post ENT evaluation -Have initiated micafungin IV for now -Supportive care for pneumomediastinum including reduction of shearing forces, avoid incentive spirometry, BiPAP/CPAP, flutter valve -Agree with empiric cefepime and Zithromax until microbiology returns -GI consultation-plan for EGD today -Guaifenesin with codeine solution 4 times daily -continue nasal canula O2 for supportive care to allow diffusion of nitrogen rich gas out of soft tissue   Severe dysphagia   - s/p EGD - severe narrowing of distal esophagus s/p biopsies  - CT cervical spine today -speech and swallow evaluation  Recurrent bilateral pleural effusion Previous thoracentesis with fluid pattern showing mildly ascitic eosinophilic predominant exudate consistent with rheumatoid associated effusions -Have DC'd IVF due to likely contributing to redevelopment of pleural effusion -Clinically patient is not  dehydrated   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     Thank you for allowing me to participate in the care of this patient.   Patient/Family are satisfied with care plan and all questions have been answered.    Provider disclosure: Patient with at least one acute or chronic illness or injury that poses a threat to life or bodily function and is being managed actively during this encounter.  All of the below services have been performed independently by signing provider:  review of prior documentation from internal and or external health records.  Review of previous and current lab results.  Interview and comprehensive assessment during patient visit today. Review of current and previous  chest radiographs/CT scans. Discussion of management and test interpretation with health care team and patient/family.   This document was prepared using Dragon voice recognition software and may include unintentional dictation errors.     Vida Rigger, M.D.  Division of Pulmonary & Critical Care Medicine

## 2021-09-07 ENCOUNTER — Inpatient Hospital Stay: Payer: 59

## 2021-09-07 DIAGNOSIS — R079 Chest pain, unspecified: Secondary | ICD-10-CM | POA: Diagnosis not present

## 2021-09-07 DIAGNOSIS — J390 Retropharyngeal and parapharyngeal abscess: Secondary | ICD-10-CM

## 2021-09-07 DIAGNOSIS — M0579 Rheumatoid arthritis with rheumatoid factor of multiple sites without organ or systems involvement: Secondary | ICD-10-CM | POA: Diagnosis not present

## 2021-09-07 DIAGNOSIS — R739 Hyperglycemia, unspecified: Secondary | ICD-10-CM | POA: Diagnosis not present

## 2021-09-07 LAB — BASIC METABOLIC PANEL
Anion gap: 6 (ref 5–15)
BUN: 19 mg/dL (ref 6–20)
CO2: 25 mmol/L (ref 22–32)
Calcium: 8.8 mg/dL — ABNORMAL LOW (ref 8.9–10.3)
Chloride: 103 mmol/L (ref 98–111)
Creatinine, Ser: 0.63 mg/dL (ref 0.61–1.24)
GFR, Estimated: >60 mL/min (ref >60)
Glucose, Bld: 227 mg/dL — ABNORMAL HIGH (ref 70–99)
Potassium: 4 mmol/L (ref 3.5–5.1)
Sodium: 134 mmol/L — ABNORMAL LOW (ref 135–145)

## 2021-09-07 LAB — GLUCOSE, CAPILLARY
Glucose-Capillary: 250 mg/dL — ABNORMAL HIGH (ref 70–99)
Glucose-Capillary: 205 mg/dL — ABNORMAL HIGH (ref 70–99)
Glucose-Capillary: 241 mg/dL — ABNORMAL HIGH (ref 70–99)

## 2021-09-07 MED ORDER — IOHEXOL 300 MG/ML  SOLN
75.0000 mL | Freq: Once | INTRAMUSCULAR | Status: AC | PRN
Start: 2021-09-07 — End: 2021-09-07
  Administered 2021-09-07: 75 mL via INTRAVENOUS

## 2021-09-07 MED ORDER — FREE WATER
145.0000 mL | Status: DC
  Administered 2021-09-07 – 2021-09-09 (×11): 145 mL

## 2021-09-07 MED ORDER — OSMOLITE 1.5 CAL PO LIQD
1500.0000 mL | ORAL | Status: DC
Start: 2021-09-07 — End: 2021-09-09
  Administered 2021-09-07: 1500 mL
  Administered 2021-09-08: 1000 mL
  Administered 2021-09-08: 1500 mL

## 2021-09-07 NOTE — Progress Notes (Signed)
PROGRESS NOTE  Sean [name]    DOB: [birth year], 46 y.o.  ZOX:[id number]    Code Status: Full Code   DOA: 09/01/2021   LOS: 6   Brief hospital course  Sean [name] is a 46 y.o. 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  They presented from home to the ED on 09/01/2021 with dyspnea and dysphagia/odynophagia, cough x several days worsening.  Patient recently treated with Diflucan for esophageal candidiasis infection. Thoracentesis 4/16  In the ED, it was found that they had temperature as high as 100.1, tachycardia up to 105, tachypnea up to 23, O2 sats 88% on room air, stable blood pressure.  Significant findings included blood glucose of 250 with calcium of 8.7 and ALT of 52 with albumin of 3 and total protein of 7.1.  High-sensitivity troponin was 9 and later 11.  Procalcitonin was less than 0.1 and CBC was unremarkable. Chest CTA revealed no evidence for PE.  Showed acute pneumomediastinum with extension to lower neck with no pneumonia no thorax/pleural air.  It showed small effusions less than previously seen.  It showed bilateral lower lobe volume loss/pneumonia with areas of cystic changes.  Findings could be due to infectious pneumonia and or rheumatoid  lung disease.  Chest CT without contrast revealed extensive pneumomediastinum and contrast-filled esophagus was patent with no extraluminal contrast extravasation.  It showed multiple focal and confluent infiltrates that are scattered throughout the lungs suggestive of multifocal pneumonia with small bilateral pleural effusions. Pulmonology was consulted.  They were initially treated with cefepime, vancomycin, Zithromax.   Patient was admitted to medicine service for further workup and management of pneumonia as outlined in detail below.  5/25- EGD 5/26- swallow study positive for extreme aspiration. Made  NPO 5/27- Dobhoff placed for enteral feed. ENT reevaluated.  5/29- CT neck shows likely developing retropharyngeal abscess. Asked ENT to evaluate again.   09/07/21 -stable, improved  Assessment & Plan  Principal Problem:   Bilateral pneumonia Active Problems:   Rheumatoid arthritis involving multiple sites with positive rheumatoid factor (HCC)   Steroid-induced hyperglycemia   Chest pain  Bilateral pneumonia  pneumomediastinum- suspect related to coughing. stable on room air. Stable repeat xray 5/25, 5/27. Cervical spine CT 5/26 shows gas in Prevertebral space, carotid space and deep cervical space.  - neurosurgery consulted and recommended conservative management at this time.  -Pulmonology consulted.  Provider is familiar with the patient's medical history.  Appreciate recommendations and care -Continue cefepime, Zithromax until 5/28 -Antitussive's as needed - supplemental oxygen   Interstitial lung disease  rheumatoid arthritis involving multiple sites with positive rheumatoid factor (HCC)- humira dose 5/25, brought in from home. -Continue Humira, prednisone taper -Pulmonology has discontinued Plaquenil due to side effect profile, and cellcept -Low threshold to re-image chest if worsening pain or respirations due to concern of expanding pneumo mediastinum - votlaren gel, analgesia PRN  Odynophagia  dysphagia Retropharyngeal abscess-  initially thought to be related to Candida esophagitis in setting of immune suppression. EGD 5/25 unrevealing of yeast but biopsies were taken to r/o CMV or other. Patient states that magic mouthwash helping. His sternal pain has greatly improved since becoming NPO.  CT soft tissue neck 5/29 showed improvement in the upper pneumomediastinum but developing retropharyngeal abscess. - starting clindamycin and levofloxacin to treat infection. Already had cefepime x5 days for PNA -Analgesia as needed -GI consulted, appreciate recommendations  -EGD  5/25 showed  stricture of upper esophageal opening, biopsy results pending  - consider stricture dilation but GI provider states that there is nothing to do from his standpoint - ENT consulted for evaluation and they have said nothing to do from their standpoint. Upon further discussion, they recommended swallow therapy - SLP evaluated with swallow study 5/26 and recommended NPO other than ice chips due to severe aspiration.   - will need repeat MBSS after period of healing - IR consulted for enteral tube placement 5/27 for nutrition - nutrition consulted for enteral tube feedings - IV PPI - magic mouthwash with lidocaine TID PRN - CBC, BMP am   Steroid-induced hyperglycemia - sliding scale restarted now with enteral feeds  Body mass index is 17.57 kg/m.  VTE ppx: enoxaparin (LOVENOX) injection 40 mg Start: 09/01/21 2200   Diet: NG tube feeds per dietition    Diet   Diet NPO time specified   Consultants: Pulmonology GI IR Neurosurgery ENT  Subjective 09/07/21    Pt reports doing ok today. He states that he is having neck pain and attributes to having to sit up in bed.    Objective   Vitals:   09/06/21 1929 09/07/21 0008 09/07/21 0430 09/07/21 0500  BP: 114/72 99/67 115/73   Pulse: 86 87 86   Resp: 18 20 18    Temp: 98.5 F (36.9 C) 98.3 F (36.8 C) 98.3 F (36.8 C)   TempSrc:      SpO2: 98% 98% 93%   Weight:    67.2 kg  Height:       No intake or output data in the 24 hours ending 09/07/21 0734  Filed Weights   09/03/21 1525 09/06/21 0500 09/07/21 0500  Weight: 72.6 kg 73 kg 67.2 kg     Physical Exam:  General: awake, alert, mild distress HEENT: atraumatic, clear conjunctiva, anicteric sclera, MMM, hearing grossly normal.  NG tube in place Respiratory: normal respiratory effort. CTAB Cardiovascular: quick capillary refill Nervous: A&O x3. no gross focal neurologic deficits, normal speech Extremities: moves all equally, no edema, normal tone Skin: dry,  intact, normal temperature, normal color. No rashes, lesions or ulcers on exposed skin Psychiatry: normal mood, congruent affect  Labs   I have personally reviewed the following labs and imaging studies CBC    Component Value Date/Time   WBC 10.3 09/02/2021 0612   RBC 3.93 (L) 09/02/2021 0612   HGB 12.3 (L) 09/02/2021 0612   HGB 13.2 07/26/2021 0905   HCT 36.3 (L) 09/02/2021 0612   HCT 39.2 07/26/2021 0905   PLT 186 09/02/2021 0612   PLT 149 (L) 07/26/2021 0905   MCV 92.4 09/02/2021 0612   MCV 92 07/26/2021 0905   MCH 31.3 09/02/2021 0612   MCHC 33.9 09/02/2021 0612   RDW 12.8 09/02/2021 0612   RDW 12.9 07/26/2021 0905   LYMPHSABS 0.8 07/26/2021 0905   MONOABS 0.4 07/25/2021 1608   EOSABS 0.1 07/26/2021 0905   BASOSABS 0.0 07/26/2021 0905      Latest Ref Rng & Units 09/07/2021    4:37 AM 09/06/2021    3:51 AM 09/05/2021   11:45 AM  BMP  Glucose 70 - 99 mg/dL 740      BUN 6 - 20 mg/dL 19      Creatinine 8.14 - 1.24 mg/dL 4.81      Sodium 856 - 145 mmol/L 134      Potassium 3.5 - 5.1 mmol/L 4.0   4.2   4.7    Chloride 98 -  111 mmol/L 103      CO2 22 - 32 mmol/L 25      Calcium 8.9 - 10.3 mg/dL 8.8        DG Chest Port 1 View  Result Date: 09/06/2021 CLINICAL DATA:  Follow-up pneumomediastinum. Rheumatoid lung disease. Aspiration pneumonia. EXAM: PORTABLE CHEST 1 VIEW COMPARISON:  09/03/2021 and chest CT dated 09/01/2021. FINDINGS: Small residual pneumomediastinum without significant change. Mildly improved bibasilar airspace opacities and left basilar linear densities. Interval mild-to-moderate peribronchial thickening. Stable small bilateral pleural effusions. Feeding tube extending into the stomach. Unremarkable bones. IMPRESSION: 1. Stable small residual pneumomediastinum. 2. Interval mild to moderate bronchitic changes. 3. Mild improved bibasilar pneumonia/aspiration pneumonitis. 4. Stable small bilateral pleural effusions. Electronically Signed   By: Beckie SaltsSteven  Reid M.D.    On: 09/06/2021 14:54   DG Naso G Tube Plc W/Fl W/Rad  Result Date: 09/05/2021 INDICATION: RA with rheumatoid lung disease, aspiration pneumonia, pneumomediastinum without extravasation of esophageal contrast on CT chest 09/01/21. Request for image guided Dobhoff tube placement due to pharyngeal edema impairing UES function on MBSS yesterday with speech pathology. EXAM: NASO G TUBE PLACEMENT WITH FL AND WITH RAD COMPARISON:  CT chest w/o contrast 09/01/21 CONTRAST:  None FLUOROSCOPY TIME:  Radiation Exposure Index (as provided by the fluoroscopic device): 9.90 MGy Kerma COMPLICATIONS: None immediate PROCEDURE: The Dobhoff tube was lubricated with viscous lidocaine inserted into the right nostril. Under intermittent fluoroscopic guidance, the Dobhoff tube was advanced to the stomach with tip terminating in the gastric antrum. A spot fluoroscopic image was saved for documentation purposes. The tube was flushed easily with 10 cc 0.9% normal saline and affixed to the patient's nose with tape at 66 cm mark. The patient tolerated the procedure well without immediate postprocedural complication. IMPRESSION: Successful fluoroscopic guided placement of Dobhoff tube with tip terminating within the gastric antrum. The tube is ready for immediate use. Performed by Lynnette CaffeyShannon Watterson, PA-C and supervised by Signa Kellaylor Stroud, MD Electronically Signed   By: Signa Kellaylor  Stroud M.D.   On: 09/05/2021 15:55    Disposition Plan & Communication  Patient status: Inpatient  Admitted From: Home Planned disposition location: Home Anticipated discharge date: 6/1 pending completing workup/treatment  Family Communication: none   Author: Leeroy Bockhelsey L Nguyen Butler, DO [facility] 09/07/2021, 7:34 AM   Available by Epic secure chat 7AM-7PM. If 7PM-7AM, please contact night-coverage.  [facility] contact information found on [email or web address].

## 2021-09-07 NOTE — Progress Notes (Signed)
PULMONOLOGY         Date: 09/07/2021,   MRN# [id number] Sean [name] [birth year]     AdmissionWeight: 72.6 kg                 CurrentWeight: 67.2 kg  Referring provider: Dr. Dareen Piano   CHIEF COMPLAINT:   Forceful cough with pneumomediastinum   HISTORY OF PRESENT ILLNESS   This is a 46 pleasant patient with a history of severe rheumatoid arthritis, seen previously while hospitalized due to bilateral multifocal pneumonia with treatment for community-acquired pneumonia and resolution.  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  Patient has been progressively dyspneic with reduction in spirometry performed in clinic via PFT.  Additional evaluation with rheumatology status post initiation of Humira infusions as well as CellCept and chronic prednisone use at higher dose starting 60 mg with slow taper.  On previous admission he also had moderate pleural effusion with resolution postthoracentesis and clinical improvement patient develops worsening symptoms when tapering below 45 mg daily including scaling of skin fingers and skin redness and erythema around nasolabial folds and worsening dyspnea and stiffness.  He came in on this admission with chest discomfort and severe dysphagia with cough.  He was seen in clinic and noted to have severe oral thrush status post ear nose and throat evaluation with course of Diflucan and partial resolution.  On examination there is still erythema in the upper airway and lower airway as well as remnants of thrush visible in the lower airway.  CT chest independently reviewed with findings of pneumomediastinum without esophageal perforation.  Overall interval improvement in lung parenchyma bilaterally.  Pulmonary critical care consultation placed for additional evaluation management   09/03/21-  patient seen at bedside. He has continued dysphagia  worse with pill swallowing. I have changed most of his PO meds to IV for now. Discussed medical plan with family and patient at bedside. Reviewed case with GI today there is plan for EGD. Reviewed CXR this am, mild b/l pleural effusions worse from prior but not clinicallyh significant, pneumonmediastinum is stable. Will continue to follow.   09/04/21 - patient seems improved.  He continues to have pain with swallowing. 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 There were biopsies done. We will obtain CT neck to eval cervical spine  09/07/21- patient clinically improved with less neck pain and less dysphagia. He had repeat CT neck with findings of fluid collection possible abscess.  Reviewed with Dr Dareen Piano and sent message to Dr Elenore Rota ENT for evaluation. He remains on cefepime.    PAST MEDICAL HISTORY   Past Medical History:  Diagnosis Date   H/O immunosuppressive therapy    Hyperpigmentation of skin    Hyperpigmentation rash/vesicles of the palm and face   Neutropenia (HCC)    RA (rheumatoid arthritis) (HCC)      SURGICAL HISTORY   Past Surgical History:  Procedure Laterality Date   ESOPHAGOGASTRODUODENOSCOPY N/A 09/03/2021   Procedure: ESOPHAGOGASTRODUODENOSCOPY (EGD);  Surgeon: Regis Bill, MD;  Location: [facility] ENDOSCOPY;  Service: Endoscopy;  Laterality: N/A;     FAMILY HISTORY   Family History  Problem Relation Age of Onset   Hyperlipidemia Mother    Hypertension Father      SOCIAL HISTORY   Social History   Tobacco Use   Smoking status: Former    Types: Cigarettes   Smokeless tobacco: Never  Vaping Use   Vaping  Use: Never used  Substance Use Topics   Alcohol use: Not Currently   Drug use: Not Currently    Types: Marijuana     MEDICATIONS    Home Medication:    Current Medication:  Current Facility-Administered Medications:    0.9 %  sodium chloride infusion, , Intravenous, PRN, Leeroy Bock, MD, Stopped at 09/04/21 1956    acetaminophen (TYLENOL) tablet 650 mg, 650 mg, Oral, Q6H PRN, 650 mg at 09/04/21 1000 **OR** acetaminophen (TYLENOL) suppository 650 mg, 650 mg, Rectal, Q6H PRN, Mansy, Jan A, MD   Adalimumab PNKT 40 mg, 40 mg, Subcutaneous, Q14 Days, Leeroy Bock, MD, 40 mg at 09/03/21 1610   diclofenac Sodium (VOLTAREN) 1 % topical gel 2 g, 2 g, Topical, QID, Jamelle Rushing L, MD, 2 g at 09/06/21 2135   enoxaparin (LOVENOX) injection 40 mg, 40 mg, Subcutaneous, Q24H, Mansy, Jan A, MD, 40 mg at 09/06/21 2135   feeding supplement (OSMOLITE 1.5 CAL) liquid 1,000 mL, 1,000 mL, Per Tube, Continuous, Leeroy Bock, MD, Last Rate: 20 mL/hr at 09/05/21 1542, 1,000 mL at 09/05/21 1542   feeding supplement (PROSource TF) liquid 45 mL, 45 mL, Per Tube, BID, Leeroy Bock, MD, 45 mL at 09/06/21 2149   fluticasone furoate-vilanterol (BREO ELLIPTA) 200-25 MCG/ACT 1 puff, 1 puff, Inhalation, Daily, 1 puff at 09/04/21 0846 **AND** umeclidinium bromide (INCRUSE ELLIPTA) 62.5 MCG/ACT 1 puff, 1 puff, Inhalation, Daily, Mansy, Jan A, MD, 1 puff at 09/04/21 0846   free water 100 mL, 100 mL, Per Tube, Q2H, Leeroy Bock, MD, 100 mL at 09/07/21 9604   HYDROmorphone (DILAUDID) injection 0.5 mg, 0.5 mg, Intravenous, Q4H PRN, Leeroy Bock, MD, 0.5 mg at 09/07/21 5409   insulin aspart (novoLOG) injection 0-9 Units, 0-9 Units, Subcutaneous, TID WC, Leeroy Bock, MD, 5 Units at 09/06/21 1720   ipratropium-albuterol (DUONEB) 0.5-2.5 (3) MG/3ML nebulizer solution 3 mL, 3 mL, Nebulization, Q4H PRN, Mansy, Jan A, MD   magic mouthwash w/lidocaine, 5 mL, Oral, TID PRN, Foust, Katy L, NP   magnesium hydroxide (MILK OF MAGNESIA) suspension 30 mL, 30 mL, Oral, Daily PRN, Mansy, Jan A, MD   methylPREDNISolone sodium succinate (SOLU-MEDROL) 40 mg/mL injection 40 mg, 40 mg, Intravenous, Q24H, Carissa Musick, MD, 40 mg at 09/06/21 0828   ondansetron (ZOFRAN) tablet 4 mg, 4 mg, Oral, Q6H PRN **OR** ondansetron  (ZOFRAN) injection 4 mg, 4 mg, Intravenous, Q6H PRN, Mansy, Jan A, MD   pantoprazole (PROTONIX) injection 40 mg, 40 mg, Intravenous, Q24H, Marilouise Densmore, MD, 40 mg at 09/06/21 8119   pneumococcal 20-valent conjugate vaccine (PREVNAR 20) injection 0.5 mL, 0.5 mL, Intramuscular, Tomorrow-1000, Mansy, Jan A, MD   sucralfate (CARAFATE) 1 GM/10ML suspension 1 g, 1 g, Oral, Once, Shaune Pollack, MD   Vitamin D (Ergocalciferol) (DRISDOL) capsule 50,000 Units, 50,000 Units, Oral, Weekly, Mansy, Jan A, MD, 50,000 Units at 09/02/21 0900    ALLERGIES   Patient has no known allergies.     REVIEW OF SYSTEMS    Review of Systems:  Gen:  Denies  fever, sweats, chills weigh loss  HEENT: Denies blurred vision, double vision, ear pain, eye pain, hearing loss, nose bleeds, sore throat Cardiac:  No dizziness, chest pain or heaviness, chest tightness,edema Resp:   reports dyspnea chronically  Gi: Denies swallowing difficulty, stomach pain, nausea or vomiting, diarrhea, constipation, bowel incontinence Gu:  Denies bladder incontinence, burning urine Ext:   Denies Joint pain, stiffness or swelling Skin: Denies  skin rash,  easy bruising or bleeding or hives Endoc:  Denies polyuria, polydipsia , polyphagia or weight change Psych:   Denies depression, insomnia or hallucinations   Other:  All other systems negative   VS: BP 115/73 (BP Location: Right Arm)   Pulse 86   Temp 98.3 F (36.8 C)   Resp 18   Ht  (1.956 m)   Wt 67.2 kg   SpO2 93%   BMI 17.57 kg/m      PHYSICAL EXAM    GENERAL:NAD, no fevers, chills, no weakness no fatigue HEAD: Normocephalic, atraumatic.  EYES: Pupils equal, round, reactive to light. Extraocular muscles intact. No scleral icterus.  MOUTH: Moist mucosal membrane. Dentition intact. No abscess noted.  EAR, NOSE, THROAT: Clear without exudates. No external lesions.  NECK: Supple. No thyromegaly. No nodules. No JVD.  PULMONARY: decreased breath sounds with  mild rhonchi worse at bases bilaterally.  CARDIOVASCULAR: S1 and S2. Regular rate and rhythm. No murmurs, rubs, or gallops. No edema. Pedal pulses 2+ bilaterally.  GASTROINTESTINAL: Soft, nontender, nondistended. No masses. Positive bowel sounds. No hepatosplenomegaly.  MUSCULOSKELETAL: No swelling, clubbing, or edema. Range of motion full in all extremities.  NEUROLOGIC: Cranial nerves II through XII are intact. No gross focal neurological deficits. Sensation intact. Reflexes intact.  SKIN: No ulceration, lesions, rashes, or cyanosis. Skin warm and dry. Turgor intact.  PSYCHIATRIC: Mood, affect within normal limits. The patient is awake, alert and oriented x 3. Insight, judgment intact.       IMAGING        ASSESSMENT/PLAN   Subacute chest discomfort and severe dysphagia Secondary to forceful cough likely due to recurrent candidiasis with thrush -Failure of Diflucan p.o. and outpatient status post ENT evaluation -Have initiated micafungin IV for now -Supportive care for pneumomediastinum including reduction of shearing forces, avoid incentive spirometry, BiPAP/CPAP, flutter valve -Agree with empiric cefepime and Zithromax until microbiology returns -GI consultation-plan for EGD today -Guaifenesin with codeine solution 4 times daily -continue nasal canula O2 for supportive care to allow diffusion of nitrogen rich gas out of soft tissue   Severe dysphagia   - s/p EGD - severe narrowing of distal esophagus s/p biopsies  - CT cervical spine today -speech and swallow evaluation  Recurrent bilateral pleural effusion Previous thoracentesis with fluid pattern showing mildly ascitic eosinophilic predominant exudate consistent with rheumatoid associated effusions -Have DC'd IVF due to likely contributing to redevelopment of pleural effusion -Clinically patient is not dehydrated   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     Thank you for allowing me to participate in the care of this patient.   Patient/Family are satisfied with care plan and all questions have been answered.    Provider disclosure: Patient with at least one acute or chronic illness or injury that poses a threat to life or bodily function and is being managed actively during this encounter.  All of the below services have been performed independently by signing provider:  review of prior documentation from internal and or external health records.  Review of previous and current lab results.  Interview and comprehensive assessment during patient visit today. Review of current and previous chest radiographs/CT scans. Discussion of management and test interpretation with health care team and patient/family.   This document was  prepared using Systems analyst and may include unintentional dictation errors.     Ottie Glazier, M.D.  Division of Pulmonary & Critical Care Medicine

## 2021-09-07 NOTE — Consult Note (Signed)
Consulting Department: Precervical emphysema  Primary Physician:  [name], MD  Chief Complaint: Precervical air  History of Present Illness: 09/07/2021 Sean [name] is a 46 y.o. male who presents with the chief complaint of precervical air.  He has a history of rheumatoid arthritis which is quite severe.  He also has a history of interstitial lung disease.  He states that he has previously had a severe coughing bout and after which developed some dysphagia and discomfort in his throat/neck.  He did not have any new neurologic symptoms.  No radiating pain.  No weakness.  No change in sensation.  States that he does have some neck discomfort however it is mostly in the anterior lateral portion.  It improves with pressure and increases with swallowing.   Review of Systems:  A 10 point review of systems is negative, except for the pertinent positives and negatives detailed in the HPI.  Past Medical History: Past Medical History:  Diagnosis Date   H/O immunosuppressive therapy    Hyperpigmentation of skin    Hyperpigmentation rash/vesicles of the palm and face   Neutropenia (HCC)    RA (rheumatoid arthritis) (HCC)     Past Surgical History: Past Surgical History:  Procedure Laterality Date   ESOPHAGOGASTRODUODENOSCOPY N/A 09/03/2021   Procedure: ESOPHAGOGASTRODUODENOSCOPY (EGD);  Surgeon: Regis Bill, MD;  Location: [facility] ENDOSCOPY;  Service: Endoscopy;  Laterality: N/A;    Allergies: Allergies as of 09/01/2021   (No Known Allergies)    Medications:  Current Facility-Administered Medications:    0.9 %  sodium chloride infusion, , Intravenous, PRN, Leeroy Bock, MD, Stopped at 09/04/21 1956   acetaminophen (TYLENOL) tablet 650 mg, 650 mg, Oral, Q6H PRN, 650 mg at 09/04/21 1000 **OR** acetaminophen (TYLENOL) suppository 650 mg, 650 mg, Rectal, Q6H PRN, Mansy, Jan A, MD   Adalimumab PNKT 40 mg, 40 mg, Subcutaneous, Q14 Days, Leeroy Bock, MD, 40 mg at  09/03/21 1610   diclofenac Sodium (VOLTAREN) 1 % topical gel 2 g, 2 g, Topical, QID, Jamelle Rushing L, MD, 2 g at 09/07/21 0854   enoxaparin (LOVENOX) injection 40 mg, 40 mg, Subcutaneous, Q24H, Mansy, Jan A, MD, 40 mg at 09/06/21 2135   feeding supplement (OSMOLITE 1.5 CAL) liquid 1,000 mL, 1,000 mL, Per Tube, Continuous, Leeroy Bock, MD, Last Rate: 20 mL/hr at 09/05/21 1542, 1,000 mL at 09/05/21 1542   feeding supplement (PROSource TF) liquid 45 mL, 45 mL, Per Tube, BID, Leeroy Bock, MD, 45 mL at 09/06/21 2149   fluticasone furoate-vilanterol (BREO ELLIPTA) 200-25 MCG/ACT 1 puff, 1 puff, Inhalation, Daily, 1 puff at 09/04/21 0846 **AND** umeclidinium bromide (INCRUSE ELLIPTA) 62.5 MCG/ACT 1 puff, 1 puff, Inhalation, Daily, Mansy, Jan A, MD, 1 puff at 09/04/21 0846   free water 100 mL, 100 mL, Per Tube, Q2H, Leeroy Bock, MD, 100 mL at 09/07/21 0854   HYDROmorphone (DILAUDID) injection 0.5 mg, 0.5 mg, Intravenous, Q4H PRN, Leeroy Bock, MD, 0.5 mg at 09/07/21 9604   insulin aspart (novoLOG) injection 0-9 Units, 0-9 Units, Subcutaneous, TID WC, Leeroy Bock, MD, 3 Units at 09/07/21 5409   ipratropium-albuterol (DUONEB) 0.5-2.5 (3) MG/3ML nebulizer solution 3 mL, 3 mL, Nebulization, Q4H PRN, Mansy, Jan A, MD   magic mouthwash w/lidocaine, 5 mL, Oral, TID PRN, Foust, Katy L, NP   magnesium hydroxide (MILK OF MAGNESIA) suspension 30 mL, 30 mL, Oral, Daily PRN, Mansy, Jan A, MD   methylPREDNISolone sodium succinate (SOLU-MEDROL) 40 mg/mL injection 40 mg, 40  mg, Intravenous, Q24H, Karna Christmas, Fuad, MD, 40 mg at 09/07/21 0852   ondansetron (ZOFRAN) tablet 4 mg, 4 mg, Oral, Q6H PRN **OR** ondansetron (ZOFRAN) injection 4 mg, 4 mg, Intravenous, Q6H PRN, Mansy, Jan A, MD   pantoprazole (PROTONIX) injection 40 mg, 40 mg, Intravenous, Q24H, Aleskerov, Fuad, MD, 40 mg at 09/07/21 9833   pneumococcal 20-valent conjugate vaccine (PREVNAR 20) injection 0.5 mL, 0.5 mL,  Intramuscular, Tomorrow-1000, Mansy, Jan A, MD   sucralfate (CARAFATE) 1 GM/10ML suspension 1 g, 1 g, Oral, Once, Shaune Pollack, MD   Vitamin D (Ergocalciferol) (DRISDOL) capsule 50,000 Units, 50,000 Units, Oral, Weekly, Mansy, Vernetta Honey, MD, 50,000 Units at 09/02/21 0900   Social History: Social History   Tobacco Use   Smoking status: Former    Types: Cigarettes   Smokeless tobacco: Never  Vaping Use   Vaping Use: Never used  Substance Use Topics   Alcohol use: Not Currently   Drug use: Not Currently    Types: Marijuana    Family Medical History: Family History  Problem Relation Age of Onset   Hyperlipidemia Mother    Hypertension Father     Physical Examination: Vitals:   09/07/21 0430 09/07/21 0831  BP: 115/73 116/81  Pulse: 86 88  Resp: 18 20  Temp: 98.3 F (36.8 C) 98.4 F (36.9 C)  SpO2: 93% 97%     General: Patient is well developed, well nourished, calm, collected, and in no apparent distress.  NEUROLOGICAL:  General: In no acute distress.   Awake, alert, oriented to person, place, and time.  Pupils equal round and reactive to light.  Facial tone is symmetric.  Tongue protrusion is midline.  There is no pronator drift.  Strength: Side Biceps Triceps Deltoid Interossei Grip Wrist Ext. Wrist Flex.  R 5 5 5 5 5 5 5   L 5 5 5 5 5 5 5    Side Iliopsoas Quads Hamstring PF DF EHL  R 5 5 5 5 5 5   L 5 5 5 5 5 5     Bilateral upper and lower extremity sensation is intact to light touch. Reflexes are 2+ and symmetric at the biceps, triceps, brachioradialis, patella and achilles. Hoffman's is absent. Clonus is not present.  Toes are down-going.    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   EXAM: CT CERVICAL SPINE WITHOUT CONTRAST   TECHNIQUE: Multidetector CT imaging of the cervical spine was performed without intravenous contrast. Multiplanar CT image reconstructions were also generated.   RADIATION DOSE  REDUCTION: This exam was performed according to the departmental dose-optimization program which includes automated exposure control, adjustment of the mA and/or kV according to patient size and/or use of iterative reconstruction technique.   COMPARISON:  CT chest without contrast 09/01/2021   FINDINGS: Alignment: No significant listhesis is present. Slight reversal of the normal cervical lordosis noted.   Skull base and vertebrae: Craniocervical junction is within normal limits. No acute or healing fractures are present. No focal osseous lesions are present.   Soft tissues and spinal canal: Gas is present in the prevertebral space, consistent with known pneumomediastinum. Some gas dissects into the carotid and deep cervical space on the right. No fluid collection present. No acute trauma. Spinal canal is within normal limits.   Disc levels:  No significant focal stenosis.   Upper chest: The lung apices are clear.  Pneumomediastinum noted.   IMPRESSION: 1. Gas in the prevertebral space, carotid space, and deep cervical space on the  right. This is consistent gas dissecting upwards from the known pneumomediastinum. 2. No acute or healing fractures. 3. No significant focal stenosis.     Electronically Signed   By: Marin Robertshristopher  Mattern M.D.   On: 09/04/2021 12:13      I have personally reviewed the images and agree with the above interpretation.  Labs:    Latest Ref Rng & Units 09/02/2021    6:12 AM 09/01/2021   12:27 PM 07/26/2021    9:05 AM  CBC  WBC 4.0 - 10.5 K/uL 10.3   6.2   3.0    Hemoglobin 13.0 - 17.0 g/dL 54.012.3   98.113.5   19.113.2    Hematocrit 39.0 - 52.0 % 36.3   40.1   39.2    Platelets 150 - 400 K/uL 186   197   149         Assessment and Plan: Sean [name] is a pleasant 46 y.o. male with a history of rheumatoid arthritis and interstitial lung disease who had a severe coughing bout recently.  Shortly thereafter he developed neck pain and swallowing  difficulties.  Denies any radiating symptoms in his upper or lower extremities.  Denies any other signs of myelopathy.  On physical exam he has good strength with nonpathologic reflexes.  His imaging demonstrates Signal in the anterior neck in the prevertebral and carotid spaces.  At the apex of his imaging of the cervical thoracic junction, there is some evidence of extension from his pneumomediastinum.  At this point there is no acute neurosurgical intervention warranted.  Would follow-up with ear nose and throat surgery about the dysphagia.  I have discussed the condition with the patient, including showing the radiographs and discussing treatment options in layman's terms.  The patient may benefit from conservative management.     Lovenia KimBrandon W. Garl Speigner, MD/MSCR Dept. of Neurosurgery

## 2021-09-07 NOTE — Progress Notes (Addendum)
Nutrition Follow-up  DOCUMENTATION CODES:   Severe malnutrition in context of chronic illness  INTERVENTION:   -Continue TF via NGT:  Increase Osmolite 1.5 @ 75 ml/hr over 20 hour period (pt can be liberated off pump for a total of 4 hours)  45 ml Prosource TF BID.    145 ml free water flush every 4 hours  Tube feeding regimen provides 2330 kcal (100% of needs), 116 grams of protein, and 1143 ml of H2O.  Total free water: 2019  -Monitor Mg, K, and Phos BID and replete as needed secondary to high refeeding risk  -If prolonged NPO status is anticipated, may need to consider permanent feeding access (ex PEG)  NUTRITION DIAGNOSIS:   Severe Malnutrition related to chronic illness (RA, rheumatoid lung disease) as evidenced by severe fat depletion, mild muscle depletion, moderate muscle depletion, percent weight loss.  Ongoing  GOAL:   Patient will meet greater than or equal to 90% of their needs  Progressing   MONITOR:   Diet advancement, TF tolerance  REASON FOR ASSESSMENT:   Malnutrition Screening Tool    ASSESSMENT:   Pt admitted with chest pain d/t bilateral PNA. 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 Bolus feedings are not ideal with NGT due to high volume causing an excessive time to administer and will also increase risk of tube clogging. RD will increase the rate of feedings to allow for pt to be be liberated from pump for up to 4 hours. MD and RN aware of plan. MD aware pt unable to discharge home with NGT.   Medications reviewed and include  solu-medrol and vitamin D.   Labs reviewed: Na: 134, K, Mg, and Phos WDL. CBGS: 205-284 (inpatient orders for glycemic control are 0-9 units insulin aspart TID with meals).    Diet Order:   Diet Order             Diet NPO time specified  Diet effective now                   EDUCATION NEEDS:   Education needs have been addressed  Skin:  Skin Assessment: Reviewed RN Assessment  Last BM:  09/06/21 (type 6)  Height:   Ht Readings from Last 1 Encounters:  09/03/21 6\' 5"  (1.956 m)    Weight:   Wt Readings from Last 1 Encounters:  09/07/21 67.2 kg   BMI:  Body mass index is 17.57 kg/m.  Estimated Nutritional Needs:   Kcal:  2200-2400 kcals  Protein:  110-120 g  Fluid:  >/= 2 L    Levada Schilling, RD, LDN, CDCES Registered Dietitian II Certified Diabetes Care and Education Specialist Please refer to [facility] for RD and/or RD on-call/weekend/after hours pager

## 2021-09-08 DIAGNOSIS — R079 Chest pain, unspecified: Secondary | ICD-10-CM | POA: Diagnosis not present

## 2021-09-08 DIAGNOSIS — R739 Hyperglycemia, unspecified: Secondary | ICD-10-CM | POA: Diagnosis not present

## 2021-09-08 DIAGNOSIS — J390 Retropharyngeal and parapharyngeal abscess: Secondary | ICD-10-CM | POA: Diagnosis not present

## 2021-09-08 DIAGNOSIS — M0579 Rheumatoid arthritis with rheumatoid factor of multiple sites without organ or systems involvement: Secondary | ICD-10-CM | POA: Diagnosis not present

## 2021-09-08 LAB — BASIC METABOLIC PANEL
Anion gap: 7 (ref 5–15)
BUN: 18 mg/dL (ref 6–20)
CO2: 27 mmol/L (ref 22–32)
Calcium: 8.9 mg/dL (ref 8.9–10.3)
Chloride: 102 mmol/L (ref 98–111)
Creatinine, Ser: 0.7 mg/dL (ref 0.61–1.24)
GFR, Estimated: >60 mL/min (ref >60)
Glucose, Bld: 215 mg/dL — ABNORMAL HIGH (ref 70–99)
Potassium: 4.5 mmol/L (ref 3.5–5.1)
Sodium: 136 mmol/L (ref 135–145)

## 2021-09-08 LAB — GLUCOSE, CAPILLARY
Glucose-Capillary: 245 mg/dL — ABNORMAL HIGH (ref 70–99)
Glucose-Capillary: 249 mg/dL — ABNORMAL HIGH (ref 70–99)
Glucose-Capillary: 205 mg/dL — ABNORMAL HIGH (ref 70–99)
Glucose-Capillary: 270 mg/dL — ABNORMAL HIGH (ref 70–99)

## 2021-09-08 LAB — CBC
HCT: 37.3 % — ABNORMAL LOW (ref 39.0–52.0)
Hemoglobin: 12.3 g/dL — ABNORMAL LOW (ref 13.0–17.0)
MCH: 30.3 pg (ref 26.0–34.0)
MCHC: 33 g/dL (ref 30.0–36.0)
MCV: 91.9 fL (ref 80.0–100.0)
Platelets: 272 10*3/uL (ref 150–400)
RBC: 4.06 MIL/uL — ABNORMAL LOW (ref 4.22–5.81)
RDW: 12.6 % (ref 11.5–15.5)
WBC: 4.8 10*3/uL (ref 4.0–10.5)
nRBC: 0 % (ref 0.0–0.2)

## 2021-09-08 LAB — SURGICAL PATHOLOGY

## 2021-09-08 MED ORDER — MAGIC MOUTHWASH W/LIDOCAINE
5.0000 mL | Freq: Three times a day (TID) | ORAL | Status: DC | PRN
  Administered 2021-09-08 – 2021-09-12 (×7): 5 mL via ORAL
  Filled 2021-09-08 (×8): qty 5

## 2021-09-08 MED ORDER — LEVOFLOXACIN 500 MG PO TABS
750.0000 mg | ORAL_TABLET | Freq: Every day | ORAL | Status: DC
  Administered 2021-09-08 – 2021-09-09 (×2): 750 mg
  Filled 2021-09-08 (×2): qty 2

## 2021-09-08 NOTE — Progress Notes (Signed)
PROGRESS NOTE  Sean [name]    DOB: [birth year], 46 y.o.  [id number]    Code Status: Full Code   DOA: 09/01/2021   LOS: 7   Brief hospital course  Sean [name] is a 46 y.o. 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  They presented from home to the ED on 09/01/2021 with dyspnea and dysphagia/odynophagia, cough x several days worsening.  Patient recently treated with Diflucan for esophageal candidiasis infection. Thoracentesis 4/16  In the ED, it was found that they had temperature as high as 100.1, tachycardia up to 105, tachypnea up to 23, O2 sats 88% on room air, stable blood pressure.  Significant findings included blood glucose of 250 with calcium of 8.7 and ALT of 52 with albumin of 3 and total protein of 7.1.  High-sensitivity troponin was 9 and later 11.  Procalcitonin was less than 0.1 and CBC was unremarkable. Chest CTA revealed no evidence for PE.  Showed acute pneumomediastinum with extension to lower neck with no pneumonia no thorax/pleural air.  It showed small effusions less than previously seen.  It showed bilateral lower lobe volume loss/pneumonia with areas of cystic changes.  Findings could be due to infectious pneumonia and or rheumatoid  lung disease.  Chest CT without contrast revealed extensive pneumomediastinum and contrast-filled esophagus was patent with no extraluminal contrast extravasation.  It showed multiple focal and confluent infiltrates that are scattered throughout the lungs suggestive of multifocal pneumonia with small bilateral pleural effusions. Pulmonology was consulted.  They were initially treated with cefepime, vancomycin, Zithromax.   Patient was admitted to medicine service for further workup and management of pneumonia as outlined in detail below.  5/25- EGD 5/26- swallow study positive for extreme aspiration. Made  NPO 5/27- Dobhoff placed for enteral feed. ENT reevaluated.  5/29- CT neck shows likely developing retropharyngeal abscess. Asked ENT to evaluate again.   09/08/21 -stable  Assessment & Plan  Principal Problem:   Bilateral pneumonia Active Problems:   Rheumatoid arthritis involving multiple sites with positive rheumatoid factor (HCC)   Steroid-induced hyperglycemia   Chest pain   Retropharyngeal abscess  Bilateral pneumonia  pneumomediastinum- completed Abx treatment for PNA. Pneumomediastinum improving from imaging 5/29. Stable ORA. Neurosurgery did not recommend surgical intervention for the cervical air. - cefepime and zithromax completed 5/28. -Pulmonology consulted, Appreciate recommendations and care -Antitussive's as needed   Interstitial lung disease  rheumatoid arthritis involving multiple sites with positive rheumatoid factor ([place])- humira dose 5/25, brought in from home. -Continue Humira, prednisone taper -Pulmonology has discontinued Plaquenil due to side effect profile, and cellcept -Low threshold to re-image chest if worsening pain or respirations due to concern of expanding pneumo mediastinum - votlaren gel, analgesia PRN  Odynophagia  dysphagia- extraluminal tracts in pharynx connecting with pleural cavity likely from coughing and responsible for PNA present on admission. Currently NPO with feeding tube in place by IR. Will need repeat MBSS, timing TBD in conjunction with speech therapy. Retropharyngeal abscess-  initially thought to be related to Candida esophagitis in setting of immune suppression. EGD 5/25 unrevealing of yeast but biopsies were taken to r/o CMV or other. Patient states that magic mouthwash helping. His sternal pain has greatly improved since becoming NPO.  CT soft tissue neck 5/29 showed improvement in the upper pneumomediastinum but developing retropharyngeal abscess. - continue levofloxacin, MRSA swab on admission neg - ENT consulted to  evaluate for intervention  of abscess ENT NEEDS TO RE-EVALUATE THIS PATIENT. 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   - will need repeat MBSS after period of healing. Timing will be critical as patient will have to have NG tube removed for study and would need IR to replace if still needed.  - nutrition consulted for enteral tube feedings- timed for 4 hour break daily to give patient some freedom from the pump. - IV PPI - magic mouthwash with lidocaine TID PRN - CBC, BMP am   Steroid-induced hyperglycemia - sliding scale restarted now with enteral feeds  Body mass index is 17.52 kg/m.  VTE ppx: enoxaparin (LOVENOX) injection 40 mg Start: 09/01/21 2200   Diet: NG tube feeds per dietition    Diet   Diet NPO time specified   Consultants: Pulmonology GI IR Neurosurgery ENT  Subjective 09/08/21    Pt reports doing well today. He has no complaints.    Objective   Vitals:   09/07/21 1934 09/08/21 0039 09/08/21 0410 09/08/21 0538  BP: 117/80 116/75 112/73   Pulse: 87 89 95   Resp: 18 18 18    Temp: 98.3 F (36.8 C) 98.2 F (36.8 C) 98.4 F (36.9 C)   TempSrc:   Oral   SpO2: 97% 97% 96%   Weight:    67 kg  Height:        Intake/Output Summary (Last 24 hours) at 09/08/2021 0752 Last data filed at 09/08/2021 0541 Gross per 24 hour  Intake 3981.25 ml  Output 175 ml  Net 3806.25 ml    Filed Weights   09/06/21 0500 09/07/21 0500 09/08/21 0538  Weight: 73 kg 67.2 kg 67 kg     Physical Exam:  General: awake, alert, mild distress HEENT: atraumatic, clear conjunctiva, anicteric sclera, MMM, hearing grossly normal.  NG tube in place Respiratory: normal  respiratory effort. CTAB Cardiovascular: quick capillary refill Nervous: A&O x3. no gross focal neurologic deficits, normal speech Extremities: moves all equally, no edema, normal tone Skin: dry, intact, normal temperature, normal color. No rashes, lesions or ulcers on exposed skin Psychiatry: normal mood, congruent affect  Labs   I have personally reviewed the following labs and imaging studies CBC    Component Value Date/Time   WBC 4.8 09/08/2021 0513   RBC 4.06 (L) 09/08/2021 0513   HGB 12.3 (L) 09/08/2021 0513   HGB 13.2 07/26/2021 0905   HCT 37.3 (L) 09/08/2021 0513   HCT 39.2 07/26/2021 0905   PLT 272 09/08/2021 0513   PLT 149 (L) 07/26/2021 0905   MCV 91.9 09/08/2021 0513   MCV 92 07/26/2021 0905   MCH 30.3 09/08/2021 0513   MCHC 33.0 09/08/2021 0513   RDW 12.6 09/08/2021 0513   RDW 12.9 07/26/2021 0905   LYMPHSABS 0.8 07/26/2021 0905   MONOABS 0.4 07/25/2021 1608   EOSABS 0.1 07/26/2021 0905   BASOSABS 0.0 07/26/2021 0905      Latest Ref Rng & Units 09/08/2021    5:13 AM 09/07/2021    4:37 AM 09/06/2021    3:51 AM  BMP  Glucose 70 - 99 mg/dL 215   227     BUN 6 - 20 mg/dL 18   19     Creatinine  0.61 - 1.24 mg/dL 0.70   0.63     Sodium 135 - 145 mmol/L 136   134     Potassium 3.5 - 5.1 mmol/L 4.5   4.0   4.2    Chloride 98 - 111 mmol/L 102   103     CO2 22 - 32 mmol/L 27   25     Calcium 8.9 - 10.3 mg/dL 8.9   8.8       CT SOFT TISSUE NECK W CONTRAST  Result Date: 09/07/2021 CLINICAL DATA:  Soft tissue swelling with infection suspected. Severe dysplasia with cough. Recent treatment for oral thrush EXAM: CT NECK WITH CONTRAST TECHNIQUE: Multidetector CT imaging of the neck was performed using the standard protocol following the bolus administration of intravenous contrast. RADIATION DOSE REDUCTION: This exam was performed according to the departmental dose-optimization program which includes automated exposure control, adjustment of the mA and/or kV according to  patient size and/or use of iterative reconstruction technique. CONTRAST:  7mL OMNIPAQUE IOHEXOL 300 MG/ML  SOLN COMPARISON:  Cervical spine CT from 3 days ago FINDINGS: Pharynx and larynx: Submucosal low-density thickening along the posterior hypopharynx and possibly at the aryepiglottic folds. There has been persistence of gas in the retropharyngeal space with clustered bubbles and fluid that shows some signs of peripheral ring is a shin with enhancing rim. The collection measures up to 8 mm anterior to posterior and 4.6 cm in transverse span. The collection is seen from C1-T2 C5-6. Gas elsewhere in the neck has resolved. Salivary glands: No inflammation, mass, or stone. Thyroid: Normal. Lymph nodes: None enlarged or abnormal density. Vascular: No acute finding. Limited intracranial: Negative Visualized orbits: Negative Mastoids and visualized paranasal sinuses: Clear Skeleton: No primary spinal infection seen. Upper chest: Patchy nodular opacities in the upper lungs as seen by recent chest CT. IMPRESSION: Upper pneumomediastinum and soft tissue emphysema has regressed but retropharyngeal gas and fluid has organized, likely developing abscess. Electronically Signed   By: Jorje Guild M.D.   On: 09/07/2021 10:07   DG Chest Port 1 View  Result Date: 09/06/2021 CLINICAL DATA:  Follow-up pneumomediastinum. Rheumatoid lung disease. Aspiration pneumonia. EXAM: PORTABLE CHEST 1 VIEW COMPARISON:  09/03/2021 and chest CT dated 09/01/2021. FINDINGS: Small residual pneumomediastinum without significant change. Mildly improved bibasilar airspace opacities and left basilar linear densities. Interval mild-to-moderate peribronchial thickening. Stable small bilateral pleural effusions. Feeding tube extending into the stomach. Unremarkable bones. IMPRESSION: 1. Stable small residual pneumomediastinum. 2. Interval mild to moderate bronchitic changes. 3. Mild improved bibasilar pneumonia/aspiration pneumonitis. 4. Stable  small bilateral pleural effusions. Electronically Signed   By: Claudie Revering M.D.   On: 09/06/2021 14:54    Disposition Plan & Communication  Patient status: Inpatient  Admitted From: Home Planned disposition location: Home Anticipated discharge date: 6/1 pending completing workup/treatment  Family Communication: none   Author: [name], DO [facility] 09/08/2021, 7:52 AM   Available by Epic secure chat 7AM-7PM. If 7PM-7AM, please contact night-coverage.  [facility] contact information found on [email or web address].

## 2021-09-08 NOTE — Consult Note (Signed)
Sean [name], Sean [name] [id number] 20-Jun-1975 [name], MD  Reason for Consult: Retropharyngeal abscess Requesting Physician: Leeroy Bock, MD Consulting Physician: [name], MD  HPI: This 46 y.o. year old male was admitted on 09/01/2021 for Bilateral pneumonia [J18.9]. Sean [name] is a 46 y.o. 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   Presented from home to the ED on 09/01/2021 with dyspnea and dysphagia/odynophagia, cough x several days worsening.  Patient recently treated with Diflucan for esophageal candidiasis infection. Thoracentesis 4/16   In the ED, it was found that they had temperature as high as 100.1, tachycardia up to 105, tachypnea up to 23, O2 sats 88% on room air, stable blood pressure.  Significant findings included blood glucose of 250 with calcium of 8.7 and ALT of 52 with albumin of 3 and total protein of 7.1.  High-sensitivity troponin was 9 and later 11.  Procalcitonin was less than 0.1 and CBC was unremarkable. Chest CTA revealed no evidence for PE.  Showed acute pneumomediastinum with extension to lower neck with no pneumonia no thorax/pleural air.  It showed small effusions less than previously seen.  It showed bilateral lower lobe volume loss/pneumonia with areas of cystic changes.  Findings could be due to infectious pneumonia and or rheumatoid  lung disease.  Chest CT without contrast revealed extensive pneumomediastinum and contrast-filled esophagus was patent with no extraluminal contrast extravasation.  It showed multiple focal and confluent infiltrates that are scattered throughout the lungs suggestive of multifocal pneumonia with small bilateral pleural effusions. Pulmonology was consulted.Initially treated with cefepime, vancomycin, Zithromax.    Patient was admitted to medicine service for further workup and management of  pneumonia.   Pulmonary and GI have been consulted to  evaluate pneumomediastinum and dysphagia.  EGD showed proximal stricture and modified barium showed esophageal dysphagia.  Patient endorses dysphagia to solids and liquids 1-2 weeks prior to admission to the ED.  No prior history of dysphagia.  Plan for NG tube for temporary feeding. Swallowing eval indicated aspiration risk. He has some throat pain but overall symptoms in terms of his chest pain, neck and throat pain have shown progressive improvement. He is not running fevers. CT yesterday shows improved edema in the deep tissues of the neck compared to CT of C-spine. He still has retropharyngeal air. There does not appear to be a significant fluid collection, however. Radiology described a 8 mm anterior to posterior and 4.6 cm in transverse span retropharyngeal collection, most of which remains air that has consolidated. Rim enhancement not impressive and I see very little fluid there. Dr. Lyn Hollingshead reviewed yesterday and did not feel this needed I&D and I concur.   Medications:  Current Facility-Administered Medications  Medication Dose Route Frequency Provider Last Rate Last Admin   0.9 %  sodium chloride infusion   Intravenous PRN Leeroy Bock, MD   Stopped at 09/04/21 1956   acetaminophen (TYLENOL) tablet 650 mg  650 mg Oral Q6H PRN Mansy, Jan A, MD   650 mg at 09/04/21 1000   Or   acetaminophen (TYLENOL) suppository 650 mg  650 mg Rectal Q6H PRN Mansy, Jan A, MD       Adalimumab PNKT 40 mg  40 mg Subcutaneous Q14 Days Leeroy Bock, MD   40 mg at 09/03/21 5621   diclofenac Sodium (VOLTAREN) 1 % topical gel 2 g  2 g Topical QID Jamelle Rushing  L, MD   2 g at 09/08/21 1705   enoxaparin (LOVENOX) injection 40 mg  40 mg Subcutaneous Q24H Mansy, Jan A, MD   40 mg at 09/07/21 2054   feeding supplement (OSMOLITE 1.5 CAL) liquid 1,500 mL  1,500 mL Per Tube Continuous Leeroy BockAnderson, Chelsey L, MD 75 mL/hr at 09/08/21 1712 1,500 mL at  09/08/21 1712   feeding supplement (PROSource TF) liquid 45 mL  45 mL Per Tube BID Leeroy BockAnderson, Chelsey L, MD   45 mL at 09/08/21 0938   fluticasone furoate-vilanterol (BREO ELLIPTA) 200-25 MCG/ACT 1 puff  1 puff Inhalation Daily Mansy, Jan A, MD   1 puff at 09/08/21 [id number]   And   umeclidinium bromide (INCRUSE ELLIPTA) 62.5 MCG/ACT 1 puff  1 puff Inhalation Daily Mansy, Jan A, MD   1 puff at 09/08/21 [id number]   free water 145 mL  145 mL Per Tube Q4H Leeroy BockAnderson, Chelsey L, MD   145 mL at 09/08/21 1705   HYDROmorphone (DILAUDID) injection 0.5 mg  0.5 mg Intravenous Q4H PRN Leeroy BockAnderson, Chelsey L, MD   0.5 mg at 09/08/21 1410   insulin aspart (novoLOG) injection 0-9 Units  0-9 Units Subcutaneous TID WC Leeroy BockAnderson, Chelsey L, MD   5 Units at 09/08/21 1705   ipratropium-albuterol (DUONEB) 0.5-2.5 (3) MG/3ML nebulizer solution 3 mL  3 mL Nebulization Q4H PRN Mansy, Jan A, MD       levofloxacin (LEVAQUIN) tablet 750 mg  750 mg Per Tube Daily Leeroy BockAnderson, Chelsey L, MD   750 mg at 09/08/21 [id number]   magic mouthwash w/lidocaine  5 mL Oral TID PRN Leeroy BockAnderson, Chelsey L, MD   5 mL at 09/08/21 1145   magnesium hydroxide (MILK OF MAGNESIA) suspension 30 mL  30 mL Oral Daily PRN Mansy, Jan A, MD       methylPREDNISolone sodium succinate (SOLU-MEDROL) 40 mg/mL injection 40 mg  40 mg Intravenous Q24H Vida RiggerAleskerov, Fuad, MD   40 mg at 09/08/21 0937   ondansetron (ZOFRAN) tablet 4 mg  4 mg Oral Q6H PRN Mansy, Jan A, MD       Or   ondansetron [facility](ZOFRAN) injection 4 mg  4 mg Intravenous Q6H PRN Mansy, Jan A, MD       pantoprazole (PROTONIX) injection 40 mg  40 mg Intravenous Q24H Vida RiggerAleskerov, Fuad, MD   40 mg at 09/08/21 0936   pneumococcal 20-valent conjugate vaccine (PREVNAR 20) injection 0.5 mL  0.5 mL Intramuscular Tomorrow-1000 Mansy, Jan A, MD       sucralfate (CARAFATE) 1 GM/10ML suspension 1 g  1 g Oral Once Shaune PollackIsaacs, [name], MD       Vitamin D (Ergocalciferol) (DRISDOL) capsule 50,000 Units  50,000 Units Oral Weekly Mansy, Jan A, MD    50,000 Units at 09/02/21 0900  .  Medications Prior to Admission  Medication Sig Dispense Refill   BREZTRI AEROSPHERE 160-9-4.8 MCG/ACT AERO Inhale 2 puffs into the lungs 2 (two) times daily.     hydroxychloroquine (PLAQUENIL) 200 MG tablet Take 1 tablet by mouth 2 (two) times daily.     mycophenolate (CELLCEPT) 500 MG tablet Take 1 tablet by mouth every 12 (twelve) hours.     predniSONE (DELTASONE) 10 MG tablet 6 tabs (60 mg)po daily for one week; then taper 5mg  every week until down to 10 mg daily 250 tablet 0   sulfamethoxazole-trimethoprim (BACTRIM) 400-80 MG tablet Take 1 tablet by mouth 3 (three) times a week.     folic acid (FOLVITE) 1 MG tablet Take 1  mg by mouth daily. (Patient not taking: Reported on 09/01/2021)     methotrexate 2.5 MG tablet Take 5 tablets by mouth every 7 (seven) days. (Patient not taking: Reported on 09/01/2021)     Vitamin D, Ergocalciferol, (DRISDOL) 1.25 MG (50000 UNIT) CAPS capsule Take 50,000 Units by mouth once a week.     witch hazel-glycerin (TUCKS) pad Apply topically as needed for hemorrhoids. (Patient not taking: Reported on 09/01/2021) 40 each 0    Allergies: No Known Allergies  PMH:  Past Medical History:  Diagnosis Date   H/O immunosuppressive therapy    Hyperpigmentation of skin    Hyperpigmentation rash/vesicles of the palm and face   Neutropenia (HCC)    RA (rheumatoid arthritis) (HCC)     Fam Hx:  Family History  Problem Relation Age of Onset   Hyperlipidemia Mother    Hypertension Father     Soc Hx:  Social History   Socioeconomic History   Marital status: Single    Spouse name: Not on file   Number of children: Not on file   Years of education: Not on file   Highest education level: Not on file  Occupational History   Not on file  Tobacco Use   Smoking status: Former    Types: Cigarettes   Smokeless tobacco: Never  Vaping Use   Vaping Use: Never used  Substance and Sexual Activity   Alcohol use: Not Currently   Drug  use: Not Currently    Types: Marijuana   Sexual activity: Not on file  Other Topics Concern   Not on file  Social History Narrative   Not on file   Social Determinants of Health   Financial Resource Strain: Not on file  Food Insecurity: Not on file  Transportation Needs: Not on file  Physical Activity: Not on file  Stress: Not on file  Social Connections: Not on file  Intimate Partner Violence: Not on file    PSH:  Past Surgical History:  Procedure Laterality Date   ESOPHAGOGASTRODUODENOSCOPY N/A 09/03/2021   Procedure: ESOPHAGOGASTRODUODENOSCOPY (EGD);  Surgeon: Regis Bill, MD;  Location: [facility] ENDOSCOPY;  Service: Endoscopy;  Laterality: N/A;  . Procedures since admission: No admission procedures for hospital encounter.  ROS: Review of systems normal other than 12 systems except per HPI.  PHYSICAL EXAM  Vitals: Blood pressure 108/80, pulse 90, temperature 98.3 F (36.8 C), resp. rate 19, height  (1.956 m), weight 67 kg, SpO2 98 %.. General: Well-developed, Well-nourished in no acute distress Mood: Mood and affect well adjusted, pleasant and cooperative. Orientation: Grossly alert and oriented. Vocal Quality: No hoarseness. Communicates verbally. head and Face: NCAT. No facial asymmetry. No visible skin lesions. No significant facial scars. No tenderness with sinus percussion. Facial strength normal and symmetric. Ears: External ears with normal landmarks, no lesions. External auditory canals free of infection, cerumen impaction or lesions. Tympanic membranes intact with good landmarks and normal mobility on pneumatic otoscopy. No middle ear effusion. Hearing: Speech reception grossly normal. Nose: External nose normal with midline dorsum and no lesions or deformity. Nasal Cavity reveals essentially midline septum with normal inferior turbinates. No significant mucosal congestion or erythema. Nasal secretions are minimal and clear. No polyps seen on anterior  rhinoscopy. NG tube in place.  Oral Cavity/ Oropharynx: Lips are normal with no lesions. Teeth no frank dental caries. Gingiva healthy with no lesions or gingivitis. Oropharynx including tongue, buccal mucosa, floor of mouth, hard and soft palate, uvula and posterior pharynx free of  exudates, erythema or lesions with normal symmetry and hydration. No significant pharyngeal edema.  Indirect Laryngoscopy/Nasopharyngoscopy: Visualization of the larynx, hypopharynx and nasopharynx is not possible in this setting with routine examination. Neck: Supple and symmetric with no palpable masses, tenderness or crepitance. The trachea is midline. Thyroid gland is soft, nontender and symmetric with no masses or enlargement. Parotid and submandibular glands are soft, nontender and symmetric, without masses. Lymphatic: Cervical lymph nodes are without palpable lymphadenopathy or tenderness. No palpable crepitance.  Respiratory: Normal respiratory effort without labored breathing. Cardiovascular: Carotid pulse shows regular rate and rhythm Neurologic: Cranial Nerves II through XII are grossly intact. Eyes: Gaze and Ocular Motility are grossly normal. PERRLA. No visible nystagmus.  MEDICAL DECISION MAKING: Data Review:  Results for orders placed or performed during the hospital encounter of 09/01/21 (from the past 48 hour(s))  Basic metabolic panel     Status: Abnormal   Collection Time: 09/07/21  4:37 AM  Result Value Ref Range   Sodium 134 (L) 135 - 145 mmol/L   Potassium 4.0 3.5 - 5.1 mmol/L   Chloride 103 98 - 111 mmol/L   CO2 25 22 - 32 mmol/L   Glucose, Bld 227 (H) 70 - 99 mg/dL    Comment: Glucose reference range applies only to samples taken after fasting for at least 8 hours.   BUN 19 6 - 20 mg/dL   Creatinine, Ser 1.61 0.61 - 1.24 mg/dL   Calcium 8.8 (L) 8.9 - 10.3 mg/dL   GFR, Estimated >09 >60 mL/min    Comment: (NOTE) Calculated using the CKD-EPI Creatinine Equation (2021)    Anion gap 6 5 -  15    Comment: Performed at [facility], [address] [place], [place] [address]  Glucose, capillary     Status: Abnormal   Collection Time: 09/07/21  6:10 AM  Result Value Ref Range   Glucose-Capillary 250 (H) 70 - 99 mg/dL    Comment: Glucose reference range applies only to samples taken after fasting for at least 8 hours.  Glucose, capillary     Status: Abnormal   Collection Time: 09/07/21 12:01 PM  Result Value Ref Range   Glucose-Capillary 205 (H) 70 - 99 mg/dL    Comment: Glucose reference range applies only to samples taken after fasting for at least 8 hours.  Glucose, capillary     Status: Abnormal   Collection Time: 09/07/21  4:15 PM  Result Value Ref Range   Glucose-Capillary 241 (H) 70 - 99 mg/dL    Comment: Glucose reference range applies only to samples taken after fasting for at least 8 hours.   Comment 1 Notify RN   CBC     Status: Abnormal   Collection Time: 09/08/21  5:13 AM  Result Value Ref Range   WBC 4.8 4.0 - 10.5 K/uL   RBC 4.06 (L) 4.22 - 5.81 MIL/uL   Hemoglobin 12.3 (L) 13.0 - 17.0 g/dL   HCT 81.1 (L) 91.4 - 78.2 %   MCV 91.9 80.0 - 100.0 fL   MCH 30.3 26.0 - 34.0 pg   MCHC 33.0 30.0 - 36.0 g/dL   RDW 95.6 21.3 - 08.6 %   Platelets 272 150 - 400 K/uL   nRBC 0.0 0.0 - 0.2 %    Comment: Performed at [facility], [address], [place], [place] [address]  Basic metabolic panel     Status: Abnormal   Collection Time: 09/08/21  5:13 AM  Result Value Ref Range  Sodium 136 135 - 145 mmol/L   Potassium 4.5 3.5 - 5.1 mmol/L   Chloride 102 98 - 111 mmol/L   CO2 27 22 - 32 mmol/L   Glucose, Bld 215 (H) 70 - 99 mg/dL    Comment: Glucose reference range applies only to samples taken after fasting for at least 8 hours.   BUN 18 6 - 20 mg/dL   Creatinine, Ser 7.84 0.61 - 1.24 mg/dL   Calcium 8.9 8.9 - 78.4 mg/dL   GFR, Estimated >12 >82 mL/min    Comment: (NOTE) Calculated using the CKD-EPI Creatinine Equation (2021)    Anion gap 7  5 - 15    Comment: Performed at [facility], [address] [place], [place] [address]  Glucose, capillary     Status: Abnormal   Collection Time: 09/08/21  5:45 AM  Result Value Ref Range   Glucose-Capillary 245 (H) 70 - 99 mg/dL    Comment: Glucose reference range applies only to samples taken after fasting for at least 8 hours.  Glucose, capillary     Status: Abnormal   Collection Time: 09/08/21  7:47 AM  Result Value Ref Range   Glucose-Capillary 249 (H) 70 - 99 mg/dL    Comment: Glucose reference range applies only to samples taken after fasting for at least 8 hours.  Glucose, capillary     Status: Abnormal   Collection Time: 09/08/21 11:50 AM  Result Value Ref Range   Glucose-Capillary 205 (H) 70 - 99 mg/dL    Comment: Glucose reference range applies only to samples taken after fasting for at least 8 hours.  Glucose, capillary     Status: Abnormal   Collection Time: 09/08/21  4:15 PM  Result Value Ref Range   Glucose-Capillary 270 (H) 70 - 99 mg/dL    Comment: Glucose reference range applies only to samples taken after fasting for at least 8 hours.  . CT SOFT TISSUE NECK W CONTRAST  Result Date: 09/07/2021 CLINICAL DATA:  Soft tissue swelling with infection suspected. Severe dysplasia with cough. Recent treatment for oral thrush EXAM: CT NECK WITH CONTRAST TECHNIQUE: Multidetector CT imaging of the neck was performed using the standard protocol following the bolus administration of intravenous contrast. RADIATION DOSE REDUCTION: This exam was performed according to the departmental dose-optimization program which includes automated exposure control, adjustment of the mA and/or kV according to patient size and/or use of iterative reconstruction technique. CONTRAST:  25mL OMNIPAQUE IOHEXOL 300 MG/ML  SOLN COMPARISON:  Cervical spine CT from 3 days ago FINDINGS: Pharynx and larynx: Submucosal low-density thickening along the posterior hypopharynx and possibly at the  aryepiglottic folds. There has been persistence of gas in the retropharyngeal space with clustered bubbles and fluid that shows some signs of peripheral ring is a shin with enhancing rim. The collection measures up to 8 mm anterior to posterior and 4.6 cm in transverse span. The collection is seen from C1-T2 C5-6. Gas elsewhere in the neck has resolved. Salivary glands: No inflammation, mass, or stone. Thyroid: Normal. Lymph nodes: None enlarged or abnormal density. Vascular: No acute finding. Limited intracranial: Negative Visualized orbits: Negative Mastoids and visualized paranasal sinuses: Clear Skeleton: No primary spinal infection seen. Upper chest: Patchy nodular opacities in the upper lungs as seen by recent chest CT. IMPRESSION: Upper pneumomediastinum and soft tissue emphysema has regressed but retropharyngeal gas and fluid has organized, likely developing abscess. Electronically Signed   By: Tiburcio Pea M.D.   On: 09/07/2021 10:07  .  ASSESSMENT: Retropharyngeal cellulitis with air bubbles in the retropharyngeal space, very little fluid collection visualized.  This is felt to have originally come from pneumomediastinum caused by severe coughing.  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  Clinically the patient has shown progressive improvement, with no ongoing fevers and improving chest and neck pain. PLAN: We can continue to follow this clinically but I do not see a strong indication for surgical intervention at this point, particularly considering the potential risk associated with surgery.  Continue n.p.o., continue IV antibiotics.   [name], MD 09/08/2021 5:24 PM

## 2021-09-08 NOTE — Progress Notes (Signed)
Inpatient Diabetes Program Recommendations  AACE/ADA: New Consensus Statement on Inpatient Glycemic Control (2015)  Target Ranges:  Prepandial:   less than 140 mg/dL      Peak postprandial:   less than 180 mg/dL (1-2 hours)      Critically ill patients:  140 - 180 mg/dL   Lab Results  Component Value Date   GLUCAP 205 (H) 09/08/2021    Review of Glycemic Control  Latest Reference Range & Units 09/08/21 05:45 09/08/21 07:47 09/08/21 11:50  Glucose-Capillary 70 - 99 mg/dL 675 (H) 916 (H) 384 (H)   Diabetes history: None Outpatient Diabetes medications:  none Current orders for Inpatient glycemic control:  Novolog sensitive tid with meals Osmolite 75 ml/hr 20 hours a day Solumedrol 40 mg IV daily  Inpatient Diabetes Program Recommendations:    May consider increasing Novolog correction to q 4 hours.   Thanks, Beryl Meager, RN, BC-ADM Inpatient Diabetes Coordinator Pager [phone]  (8a-5p)

## 2021-09-08 NOTE — Progress Notes (Signed)
PULMONOLOGY         Date: 09/08/2021,   MRN# [id number] Sean [name] Aug 18, 1975     AdmissionWeight: 72.6 kg                 CurrentWeight: 67 kg  Referring provider: Dr. Dareen Piano   CHIEF COMPLAINT:   Forceful cough with pneumomediastinum   HISTORY OF PRESENT ILLNESS   This is a 46 pleasant patient with a history of severe rheumatoid arthritis, seen previously while hospitalized due to bilateral multifocal pneumonia with treatment for community-acquired pneumonia and resolution.  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  Patient has been progressively dyspneic with reduction in spirometry performed in clinic via PFT.  Additional evaluation with rheumatology status post initiation of Humira infusions as well as CellCept and chronic prednisone use at higher dose starting 60 mg with slow taper.  On previous admission he also had moderate pleural effusion with resolution postthoracentesis and clinical improvement patient develops worsening symptoms when tapering below 45 mg daily including scaling of skin fingers and skin redness and erythema around nasolabial folds and worsening dyspnea and stiffness.  He came in on this admission with chest discomfort and severe dysphagia with cough.  He was seen in clinic and noted to have severe oral thrush status post ear nose and throat evaluation with course of Diflucan and partial resolution.  On examination there is still erythema in the upper airway and lower airway as well as remnants of thrush visible in the lower airway.  CT chest independently reviewed with findings of pneumomediastinum without esophageal perforation.  Overall interval improvement in lung parenchyma bilaterally.  Pulmonary critical care consultation placed for additional evaluation management   09/03/21-  patient seen at bedside. He has continued dysphagia worse  with pill swallowing. I have changed most of his PO meds to IV for now. Discussed medical plan with family and patient at bedside. Reviewed case with GI today there is plan for EGD. Reviewed CXR this am, mild b/l pleural effusions worse from prior but not clinicallyh significant, pneumonmediastinum is stable. Will continue to follow.   09/04/21 - patient seems improved.  He continues to have pain with swallowing. 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 There were biopsies done. We will obtain CT neck to eval cervical spine  09/07/21- patient clinically improved with less neck pain and less dysphagia. He had repeat CT neck with findings of fluid collection possible abscess.  Reviewed with Dr Dareen Piano and sent message to Dr Elenore Rota ENT for evaluation. He remains on cefepime.   09/08/21- Patient is s/p ENT evaluation with no findings to suggest abscess. Ive ordered repeat SLP to allow patient PO nourishement. He remains on levofloxacin and solumedrol for now and is clinically improved.    PAST MEDICAL HISTORY   Past Medical History:  Diagnosis Date   H/O immunosuppressive therapy    Hyperpigmentation of skin    Hyperpigmentation rash/vesicles of the palm and face   Neutropenia (HCC)    RA (rheumatoid arthritis) (HCC)      SURGICAL HISTORY   Past Surgical History:  Procedure Laterality Date   ESOPHAGOGASTRODUODENOSCOPY N/A 09/03/2021   Procedure: ESOPHAGOGASTRODUODENOSCOPY (EGD);  Surgeon: Regis Bill, MD;  Location: [facility] ENDOSCOPY;  Service: Endoscopy;  Laterality: N/A;     FAMILY HISTORY   Family History  Problem Relation Age of Onset   Hyperlipidemia Mother    Hypertension Father  SOCIAL HISTORY   Social History   Tobacco Use   Smoking status: Former    Types: Cigarettes   Smokeless tobacco: Never  Vaping Use   Vaping Use: Never used  Substance Use Topics   Alcohol use: Not Currently   Drug use: Not Currently    Types: Marijuana     MEDICATIONS     Home Medication:    Current Medication:  Current Facility-Administered Medications:    0.9 %  sodium chloride infusion, , Intravenous, PRN, Leeroy Bock, MD, Stopped at 09/04/21 1956   acetaminophen (TYLENOL) tablet 650 mg, 650 mg, Oral, Q6H PRN, 650 mg at 09/04/21 1000 **OR** acetaminophen (TYLENOL) suppository 650 mg, 650 mg, Rectal, Q6H PRN, Mansy, Jan A, MD   Adalimumab PNKT 40 mg, 40 mg, Subcutaneous, Q14 Days, Leeroy Bock, MD, 40 mg at 09/03/21 1610   diclofenac Sodium (VOLTAREN) 1 % topical gel 2 g, 2 g, Topical, QID, Jamelle Rushing L, MD, 2 g at 09/08/21 1705   enoxaparin (LOVENOX) injection 40 mg, 40 mg, Subcutaneous, Q24H, Mansy, Jan A, MD, 40 mg at 09/07/21 2054   feeding supplement (OSMOLITE 1.5 CAL) liquid 1,500 mL, 1,500 mL, Per Tube, Continuous, Leeroy Bock, MD, Last Rate: 75 mL/hr at 09/08/21 1712, 1,500 mL at 09/08/21 1712   feeding supplement (PROSource TF) liquid 45 mL, 45 mL, Per Tube, BID, Leeroy Bock, MD, 45 mL at 09/08/21 0938   fluticasone furoate-vilanterol (BREO ELLIPTA) 200-25 MCG/ACT 1 puff, 1 puff, Inhalation, Daily, 1 puff at 09/08/21 0939 **AND** umeclidinium bromide (INCRUSE ELLIPTA) 62.5 MCG/ACT 1 puff, 1 puff, Inhalation, Daily, Mansy, Jan A, MD, 1 puff at 09/08/21 9604   free water 145 mL, 145 mL, Per Tube, Q4H, Leeroy Bock, MD, 145 mL at 09/08/21 1705   HYDROmorphone (DILAUDID) injection 0.5 mg, 0.5 mg, Intravenous, Q4H PRN, Leeroy Bock, MD, 0.5 mg at 09/08/21 1410   insulin aspart (novoLOG) injection 0-9 Units, 0-9 Units, Subcutaneous, TID WC, Leeroy Bock, MD, 5 Units at 09/08/21 1705   ipratropium-albuterol (DUONEB) 0.5-2.5 (3) MG/3ML nebulizer solution 3 mL, 3 mL, Nebulization, Q4H PRN, Mansy, Jan A, MD   levofloxacin (LEVAQUIN) tablet 750 mg, 750 mg, Per Tube, Daily, Leeroy Bock, MD, 750 mg at 09/08/21 5409   magic mouthwash w/lidocaine, 5 mL, Oral, TID PRN, Leeroy Bock, MD, 5 mL  at 09/08/21 1145   magnesium hydroxide (MILK OF MAGNESIA) suspension 30 mL, 30 mL, Oral, Daily PRN, Mansy, Jan A, MD   methylPREDNISolone sodium succinate (SOLU-MEDROL) 40 mg/mL injection 40 mg, 40 mg, Intravenous, Q24H, Kassadee Carawan, MD, 40 mg at 09/08/21 0937   ondansetron (ZOFRAN) tablet 4 mg, 4 mg, Oral, Q6H PRN **OR** ondansetron (ZOFRAN) injection 4 mg, 4 mg, Intravenous, Q6H PRN, Mansy, Jan A, MD   pantoprazole (PROTONIX) injection 40 mg, 40 mg, Intravenous, Q24H, Derek Huneycutt, MD, 40 mg at 09/08/21 8119   pneumococcal 20-valent conjugate vaccine (PREVNAR 20) injection 0.5 mL, 0.5 mL, Intramuscular, Tomorrow-1000, Mansy, Jan A, MD   sucralfate (CARAFATE) 1 GM/10ML suspension 1 g, 1 g, Oral, Once, Shaune Pollack, MD   Vitamin D (Ergocalciferol) (DRISDOL) capsule 50,000 Units, 50,000 Units, Oral, Weekly, Mansy, Jan A, MD, 50,000 Units at 09/02/21 0900    ALLERGIES   Patient has no known allergies.     REVIEW OF SYSTEMS    Review of Systems:  Gen:  Denies  fever, sweats, chills weigh loss  HEENT: Denies blurred vision, double vision, ear pain, eye pain,  hearing loss, nose bleeds, sore throat Cardiac:  No dizziness, chest pain or heaviness, chest tightness,edema Resp:   reports dyspnea chronically  Gi: Denies swallowing difficulty, stomach pain, nausea or vomiting, diarrhea, constipation, bowel incontinence Gu:  Denies bladder incontinence, burning urine Ext:   Denies Joint pain, stiffness or swelling Skin: Denies  skin rash, easy bruising or bleeding or hives Endoc:  Denies polyuria, polydipsia , polyphagia or weight change Psych:   Denies depression, insomnia or hallucinations   Other:  All other systems negative   VS: BP 108/80 (BP Location: Left Arm)   Pulse 90   Temp 98.3 F (36.8 C)   Resp 19   Ht 6\' 5"  (1.956 m)   Wt 67 kg   SpO2 98%   BMI 17.52 kg/m      PHYSICAL EXAM    GENERAL:NAD, no fevers, chills, no weakness no fatigue HEAD: Normocephalic,  atraumatic.  EYES: Pupils equal, round, reactive to light. Extraocular muscles intact. No scleral icterus.  MOUTH: Moist mucosal membrane. Dentition intact. No abscess noted.  EAR, NOSE, THROAT: Clear without exudates. No external lesions.  NECK: Supple. No thyromegaly. No nodules. No JVD.  PULMONARY: decreased breath sounds with mild rhonchi worse at bases bilaterally.  CARDIOVASCULAR: S1 and S2. Regular rate and rhythm. No murmurs, rubs, or gallops. No edema. Pedal pulses 2+ bilaterally.  GASTROINTESTINAL: Soft, nontender, nondistended. No masses. Positive bowel sounds. No hepatosplenomegaly.  MUSCULOSKELETAL: No swelling, clubbing, or edema. Range of motion full in all extremities.  NEUROLOGIC: Cranial nerves II through XII are intact. No gross focal neurological deficits. Sensation intact. Reflexes intact.  SKIN: No ulceration, lesions, rashes, or cyanosis. Skin warm and dry. Turgor intact.  PSYCHIATRIC: Mood, affect within normal limits. The patient is awake, alert and oriented x 3. Insight, judgment intact.       IMAGING        ASSESSMENT/PLAN   Subacute chest discomfort and severe dysphagia Secondary to forceful cough likely due to recurrent candidiasis with thrush -Failure of Diflucan p.o. and outpatient status post ENT evaluation -s/p micafungin IV for now -Supportive care for pneumomediastinum including reduction of shearing forces, avoid incentive spirometry, BiPAP/CPAP, flutter valve -Agree with empiric cefepime and Zithromax narrowed to levofloxacin -GI consultation-s/p EGD -continue nasal canula O2 for supportive care to allow diffusion of nitrogen rich gas out of soft tissue   Severe dysphagia   - s/p EGD - severe narrowing of distal esophagus s/p biopsies  - CT cervical spine today -speech and swallow evaluation- nPO  Recurrent bilateral pleural effusion Previous thoracentesis with fluid pattern showing mildly ascitic eosinophilic predominant exudate  consistent with rheumatoid associated effusions -Have DC'd IVF due to likely contributing to redevelopment of pleural effusion -Clinically patient is not dehydrated   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     Thank you for allowing me to participate in the care of this patient.   Patient/Family are satisfied with care plan and all questions have been answered.    Provider disclosure: Patient with at least one acute or chronic illness or injury that poses a threat to life or bodily function and is being managed actively during this encounter.  All of the below services have been performed independently by signing provider:  review of prior documentation from  internal and or external health records.  Review of previous and current lab results.  Interview and comprehensive assessment during patient visit today. Review of current and previous chest radiographs/CT scans. Discussion of management and test interpretation with health care team and patient/family.   This document was prepared using Dragon voice recognition software and may include unintentional dictation errors.     Ottie Glazier, M.D.  Division of Pulmonary & Critical Care Medicine

## 2021-09-09 ENCOUNTER — Inpatient Hospital Stay: Payer: 59

## 2021-09-09 DIAGNOSIS — J390 Retropharyngeal and parapharyngeal abscess: Secondary | ICD-10-CM | POA: Diagnosis not present

## 2021-09-09 DIAGNOSIS — R634 Abnormal weight loss: Secondary | ICD-10-CM

## 2021-09-09 DIAGNOSIS — R1312 Dysphagia, oropharyngeal phase: Secondary | ICD-10-CM

## 2021-09-09 DIAGNOSIS — M0579 Rheumatoid arthritis with rheumatoid factor of multiple sites without organ or systems involvement: Secondary | ICD-10-CM | POA: Diagnosis not present

## 2021-09-09 DIAGNOSIS — R1310 Dysphagia, unspecified: Secondary | ICD-10-CM

## 2021-09-09 DIAGNOSIS — R739 Hyperglycemia, unspecified: Secondary | ICD-10-CM | POA: Diagnosis not present

## 2021-09-09 DIAGNOSIS — J189 Pneumonia, unspecified organism: Secondary | ICD-10-CM | POA: Diagnosis not present

## 2021-09-09 DIAGNOSIS — E43 Unspecified severe protein-calorie malnutrition: Secondary | ICD-10-CM | POA: Insufficient documentation

## 2021-09-09 LAB — BASIC METABOLIC PANEL
Anion gap: 6 (ref 5–15)
BUN: 18 mg/dL (ref 6–20)
CO2: 26 mmol/L (ref 22–32)
Calcium: 8.6 mg/dL — ABNORMAL LOW (ref 8.9–10.3)
Chloride: 99 mmol/L (ref 98–111)
Creatinine, Ser: 0.57 mg/dL — ABNORMAL LOW (ref 0.61–1.24)
GFR, Estimated: >60 mL/min (ref >60)
Glucose, Bld: 292 mg/dL — ABNORMAL HIGH (ref 70–99)
Potassium: 3.9 mmol/L (ref 3.5–5.1)
Sodium: 131 mmol/L — ABNORMAL LOW (ref 135–145)

## 2021-09-09 LAB — CBC
HCT: 35.4 % — ABNORMAL LOW (ref 39.0–52.0)
Hemoglobin: 12.1 g/dL — ABNORMAL LOW (ref 13.0–17.0)
MCH: 31.2 pg (ref 26.0–34.0)
MCHC: 34.2 g/dL (ref 30.0–36.0)
MCV: 91.2 fL (ref 80.0–100.0)
Platelets: 283 10*3/uL (ref 150–400)
RBC: 3.88 MIL/uL — ABNORMAL LOW (ref 4.22–5.81)
RDW: 12.5 % (ref 11.5–15.5)
WBC: 4.3 10*3/uL (ref 4.0–10.5)
nRBC: 0 % (ref 0.0–0.2)

## 2021-09-09 LAB — GLUCOSE, CAPILLARY
Glucose-Capillary: 263 mg/dL — ABNORMAL HIGH (ref 70–99)
Glucose-Capillary: 173 mg/dL — ABNORMAL HIGH (ref 70–99)
Glucose-Capillary: 185 mg/dL — ABNORMAL HIGH (ref 70–99)
Glucose-Capillary: 79 mg/dL (ref 70–99)

## 2021-09-09 LAB — ACID FAST CULTURE WITH REFLEXED SENSITIVITIES (MYCOBACTERIA): Acid Fast Culture: NEGATIVE

## 2021-09-09 MED ORDER — SODIUM CHLORIDE 0.9 % IV SOLN
3.0000 g | Freq: Four times a day (QID) | INTRAVENOUS | Status: DC
  Administered 2021-09-09 – 2021-09-14 (×19): 3 g via INTRAVENOUS
  Filled 2021-09-09 (×3): qty 8
  Filled 2021-09-09 (×2): qty 3
  Filled 2021-09-09 (×7): qty 8
  Filled 2021-09-09: qty 3
  Filled 2021-09-09 (×6): qty 8
  Filled 2021-09-09 (×2): qty 3
  Filled 2021-09-09: qty 8
  Filled 2021-09-09: qty 3

## 2021-09-09 MED ORDER — POTASSIUM CHLORIDE IN NACL 20-0.9 MEQ/L-% IV SOLN
INTRAVENOUS | Status: AC
  Filled 2021-09-09 (×4): qty 1000

## 2021-09-09 NOTE — Evaluation (Incomplete)
Objective Swallowing Evaluation: Type of Study: MBS-Modified Barium Swallow Study   Patient Details  Name: Sean [name] MRN: [id number] Date of Birth: September 11, 1975  Today's Date: 09/09/2021 Time: SLP Start Time (ACUTE ONLY): 1100 -SLP Stop Time (ACUTE ONLY): 1230  SLP Time Calculation (min) (ACUTE ONLY): 90 min   Past Medical History:  Past Medical History:  Diagnosis Date   H/O immunosuppressive therapy    Hyperpigmentation of skin    Hyperpigmentation rash/vesicles of the palm and face   Neutropenia (HCC)    RA (rheumatoid arthritis) ([place])    Past Surgical History:  Past Surgical History:  Procedure Laterality Date   ESOPHAGOGASTRODUODENOSCOPY N/A 09/03/2021   Procedure: ESOPHAGOGASTRODUODENOSCOPY (EGD);  Surgeon: Lesly Rubenstein, MD;  Location: [facility] ENDOSCOPY;  Service: Endoscopy;  Laterality: N/A;   HPI: Pt is a 46 y.o. 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  He denied any nausea or vomiting or abdominal pain.  Pt was admitted w/ Bilateral pneumonia  -  continue antibiotic therapy with IV cefepime and Zithromax as well as vancomycin given his immunosuppressed status;  Rheumatoid arthritis involving multiple sites with positive rheumatoid factor ([place]); Steroid-induced hyperglycemia.    CT of Chest: Extensive pneumomediastinum as previously demonstrated. Contrast  filled esophagus is patent and no extraluminal contrast  extravasation is identified.  2. Multiple focal and confluent infiltrates are scattered throughout  the lungs, suggesting multifocal pneumonia. Small bilateral pleural  effusions. Also noted results of CT of Spine: Gas in the prevertebral space, carotid  space, and deep cervical  space on the right. This is consistent gas dissecting upwards from  the known pneumomediastinum.   MBSS completed on 5/26 w/ recommendation for NPO status -- see report. Repeat MBSS TBD.   Subjective: pt awake, verbal and engaged fully w/ this Clinician during the MBSS following all instructions    Recommendations for follow up therapy are one component of a multi-disciplinary discharge planning process, led by the attending physician.  Recommendations may be updated based on patient status, additional functional criteria and insurance authorization.  Assessment / Plan / Recommendation     09/09/2021    3:00 PM  Clinical Impressions  SLP Visit Diagnosis Dysphagia, pharyngeal phase (R13.13);Dysphagia, pharyngoesophageal phase (R13.14)  Impact on safety and function Moderate aspiration risk;Risk for inadequate nutrition/hydration         09/09/2021    3:00 PM  Treatment Recommendations  Treatment Recommendations Therapy as outlined in treatment plan below        09/09/2021    3:00 PM  Prognosis  Prognosis for Safe Diet Advancement Fair  Barriers to Reach Goals Time post onset;Severity of deficits;Motivation  Barriers/Prognosis Comment apparent improvement in medical status(edema) in cervical neck since last mbss; swallow has improved somewhat       09/09/2021    3:00 PM  Diet Recommendations  SLP Diet Recommendations --  Liquid Administration via Cup;No straw  Medication Administration Crushed with puree  Compensations Minimize environmental distractions;Slow rate;Small sips/bites;Multiple dry swallows after each bite/sip;Follow solids with liquid;Effortful swallow;Clear throat intermittently  Postural Changes Remain semi-upright after after feeds/meals (Comment);Seated upright at 90 degrees         09/09/2021    3:00 PM  Other Recommendations  Recommended Consults Consider ENT evaluation  Oral Care Recommendations Oral care BID;Patient independent  with oral care;Oral care before and after PO  Other Recommendations --  Follow Up Recommendations Home health SLP  Assistance recommended at discharge None  Functional Status Assessment Patient has had a recent decline in their functional status and/or demonstrates limited ability to make significant improvements in function in a reasonable and predictable amount of time       09/09/2021    3:00 PM  Frequency and Duration   Speech Therapy Frequency (ACUTE ONLY) min 2x/week  Treatment Duration 2 weeks         09/09/2021    3:00 PM  Oral Phase  Oral Phase [facility]       09/09/2021    3:00 PM  Pharyngeal Phase  Pharyngeal Phase Impaired  Pharyngeal- Pudding Teaspoon Pharyngeal residue - pyriform;Pharyngeal residue - valleculae;Pharyngeal residue - posterior pharnyx;Lateral channel residue;Reduced epiglottic inversion;Reduced pharyngeal peristalsis;Reduced anterior laryngeal mobility;Reduced laryngeal elevation  Pharyngeal Material does not enter airway  Pharyngeal- Nectar Teaspoon Reduced epiglottic inversion;Reduced pharyngeal peristalsis;Reduced anterior laryngeal mobility;Reduced laryngeal elevation;Pharyngeal residue - valleculae;Pharyngeal residue - pyriform;Pharyngeal residue - posterior pharnyx;Lateral channel residue  Pharyngeal Material does not enter airway  Pharyngeal- Nectar Cup Reduced pharyngeal peristalsis;Reduced epiglottic inversion;Reduced anterior laryngeal mobility;Reduced laryngeal elevation;Pharyngeal residue - valleculae;Pharyngeal residue - pyriform;Pharyngeal residue - posterior pharnyx;Lateral channel residue  Pharyngeal Material does not enter airway  Pharyngeal- Thin Cup Reduced pharyngeal peristalsis;Reduced epiglottic inversion;Reduced anterior laryngeal mobility;Reduced laryngeal elevation;Pharyngeal residue - pyriform;Pharyngeal residue - posterior pharnyx;Pharyngeal residue - valleculae;Lateral channel residue  Pharyngeal Material does not enter airway   Pharyngeal- Puree Reduced pharyngeal peristalsis;Reduced epiglottic inversion;Reduced anterior laryngeal mobility;Reduced laryngeal elevation;Pharyngeal residue - pyriform;Pharyngeal residue - valleculae;Pharyngeal residue - posterior pharnyx;Lateral channel residue  Pharyngeal Material does not enter airway  Pharyngeal Comment min improved bolus motility through the pharynx w/ less residue remaining post swallow compared to previous study on 09/04/2021        09/09/2021    3:00 PM  Cervical Esophageal Phase   Cervical Esophageal Phase Impaired  Cervical Esophageal Comment decreased bolus motility through the UES and cervical esophagus.          Orinda Kenner, MS, CCC-SLP Speech Language Pathologist Rehab Services; [place] [phone] (ascom) Walter Min 09/09/2021, 3:55 PM

## 2021-09-09 NOTE — Progress Notes (Addendum)
Speech Language Pathology Treatment: Dysphagia  Patient Details Name: Sean [name] MRN: [id number] DOB: May 29, 1975 Today's Date: 09/09/2021 Time: 1430-1500 SLP Time Calculation (min) (ACUTE ONLY): 30 min  Assessment / Plan / Recommendation Clinical Impression  Pt seen for education re: oral care for maintenance of swallowing musculature as well as hygiene and pleasure. Discussion w/ pt and Mother on the MBSS results, the improvement in the pharyngeal swallow function itself -- No aspiration of po trials this study, and oral intake using strategies/precautions. Also briefly discussed the concern for, and recommendation of, need for further assessment/care by ENT re: safety of oral intake d/t the edema of the cervical neck, and a potential perforation of proximal pharyngo-Esophageal wall (posterior wall) resulting in contrast moving into the retropharyngeal space -- MD/ENT notified of this observation during the MBSS today and will f/u w/ pt/Mother. Questions answered re: the oropharyngeal swallow. Oral swabs provided. Addendum this PM: ENT/MD has recommended pt remain NPO w/ oral care/hygiene w/ alternative means of feeding (PEG) discussion at this time. NSG aware of above.       HPI HPI: Pt is a 46 y.o. 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  He denied any nausea or vomiting or abdominal pain.  Pt was admitted w/ Bilateral pneumonia  -  continue antibiotic therapy with IV cefepime and Zithromax as well as vancomycin given his immunosuppressed status;  Rheumatoid arthritis involving multiple sites with positive rheumatoid factor ([place]); Steroid-induced hyperglycemia.    CT of Chest:  Extensive pneumomediastinum as previously demonstrated. Contrast  filled esophagus is patent and no extraluminal contrast  extravasation is identified.  2. Multiple focal and confluent infiltrates are scattered throughout  the lungs, suggesting multifocal pneumonia. Small bilateral pleural  effusions. Also noted results of CT of Spine: Gas in the prevertebral space, carotid space, and deep cervical  space on the right. This is consistent gas dissecting upwards from  the known pneumomediastinum.   MBSS completed on 5/26 w/ recommendation for NPO status -- see report. Repeat MBSS revealed ongoing pharyngoesophageal phase dysphagia w/ recommendation to be NPO secondary to concern of edema of the cervical neck; potential perforation of proximal Esophagus per ENT/MD assessment resulting in contrast moving into the retropharyngeal space.      SLP Plan  All goals met;Discharge SLP treatment due to (comment) (pt is to remain NPO w/ consideration of PEG placement per ENT/MD recs)      Recommendations for follow up therapy are one component of a multi-disciplinary discharge planning process, led by the attending physician.  Recommendations may be updated based on patient status, additional functional criteria and insurance authorization.    Recommendations  Diet recommendations: NPO Medication Administration: Via alternative means Supervision: Patient able to self feed (ice chips) Compensations: Minimize environmental distractions;Slow rate (w/ prior oral care) Postural Changes and/or Swallow Maneuvers: Seated upright 90 degrees                General recommendations:  (ENT, GI consults) Oral Care Recommendations: Oral care QID;Oral care prior to ice chip/H20;Patient independent with oral care Follow Up Recommendations: Follow physician's recommendations for discharge plan and follow up therapies Assistance recommended at discharge: None SLP Visit Diagnosis: Dysphagia, pharyngeal phase  (R13.13);Dysphagia, pharyngoesophageal phase (R13.14) (edema of the  cervical neck; potential perforation of proximal Esophagus per ENT/MD assessment resulting in contrast moving into the retropharyngeal space.) Plan: All goals met;Discharge SLP treatment due to (comment) (pt is to remain NPO w/ consideration of PEG placement per ENT/MD recs)              Orinda Kenner, MS, [place]; [place] [phone] (ascom) [place]  09/09/2021, 5:14 PM

## 2021-09-09 NOTE — Assessment & Plan Note (Addendum)
Initially CT soft tissue neck on 5/29 raises concern for developing retropharyngeal abscess.  Imaging reviewed by ENT, who didn't appreciate an abscess that warrants this surgical intervention.  Barium swallow study on 5/31 raises concern for posterior pharyngeal perforation per radiology and SLP.  EGD on 5/25 normal except for proximal esophageal stricture.  Esophageal biopsy negative for CMV or malignancy. In-house ENT recommends transfer to tertiary center for repair. On-call ENT at the [name], Dr. [name] recommended strict n.p.o., antibiotics, good oral hygiene and G-tube feeding.  Transfer not necessary unless patient decompensates -Appreciate help by SLP, pulmonology, ENT and GI. -Continue IV Unasyn.  Discharged on Augmentin -Added chlorhexidine mouth rinse -IR placed G-tube on 6/2.  Bolus tube feed started on 6/3.

## 2021-09-09 NOTE — Progress Notes (Signed)
09/09/21  Case discussed with Dr. Alanda SlimGonfa.  Patient has a posterior pharyngeal perforation, and request was made for G tube as the patient will be NPO strictly.  However, discussed with him that for a surgical G tube, the patient would need general anesthesia, which would require endotracheal intubation.  Uncertain if this would be recommended given his perforation.  Another potential option would be IR for percutaneous G tube, but I'm also not certain of the feasibility of this.  Suggested that both teams were asked about potential options.  If anesthesia is willing to proceed with endotracheal intubation, then we could do an open vs robotic G tube placement.  For now, will await decision and if General Surgery needs to be involved, will leave formal consult note.  If neither team is able to proceed, then patient would need transfer to tertiary center for further care and more definitive enteral feeding route.  Henrene DodgeJose Trenyce Loera, MD

## 2021-09-09 NOTE — Progress Notes (Addendum)
PROGRESS NOTE  Sean [name] [id number] DOB: 1975-08-04   PCP: Casilda Carls, MD  Patient is from: Home.  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  CTA chest on 5/23 negative for PE but acute pneumomediastinum with extension into the lower neck and bilateral lower lobe volume loss/possible consolidation concerning for pneumonia or rheumatoid lung disease.  CT chest with oral contrast on 5/23 confirmed pneumomediastinum and multiple focal and confluent infiltrates scattered throughout the lungs but no contrast extravasation. patient was started on broad-spectrum antibiotics with cefepime and Zithromax.  Pulmonology, GI and ENT consulted.  CT cervical spine without contrast.  CT cervical spine without contrast on 5/26 concerning for gas in the prevertebral space, carotid space, deep cervical space on the right likely an extension from known pneumomediastinum.  Patient had fluoroscopy guided Dobbhoff tube placed on 5/27.  CT soft tissue neck with contrast on 5/29 showed regression of upper pneumomediastinum and soft tissue emphysema but organized retropharyngeal gas or fluid concerning for developing abscess.  Imaging reviewed by me ENT who did not feel there is significant abscess for surgical drain.  NG tube removed on 5/31.  He had a barium swallow study concerning for posterior pharyngeal perforation.  ENT recommended transfer to tertiary center for repair.  Addendum [facility], and talked to [name] and ENT on  call.  He reviewed patient's imaging, and recommended strict n.p.o., good oral hygiene with good antibiotic coverage and G-tube for feeding to allow the posterior pharyngeal perforation heal on its own. He feels stitching or trying to repair the perforation would cause more tissue damage. He also suggested calling back if patient is worse clinically.  General surgery consulted for G-tube placement.     Subjective: Seen and examined earlier this morning.  No major events overnight of this morning.  He had NG tube out this morning.  He had some blood tinge on phlegm after NGT was removed.  He denies chest pain, shortness of breath, GI or UTI symptoms.  Objective: Vitals:   09/09/21 0551 09/09/21 0826 09/09/21 1159 09/09/21 1638  BP:  117/76 108/80 107/72  Pulse:  94 94 94  Resp:  18 17 17   Temp:  97.7 F (36.5 C) 97.8 F (36.6 C) 97.8 F (36.6 C)  TempSrc:      SpO2:  94% 98% 100%  Weight: 70.5 kg     Height:        Examination:  GENERAL: No apparent distress.  Nontoxic. HEENT: MMM.  Vision and hearing grossly intact.  NECK: Supple.  No apparent JVD.  RESP:  No IWOB.  Fair aeration bilaterally. CVS:  RRR. Heart sounds normal.  ABD/GI/GU: BS+. Abd soft, NTND.  MSK/EXT:  Moves extremities. No apparent deformity. No edema.  SKIN: no apparent skin lesion or wound NEURO: Awake, alert and oriented appropriately.  No apparent focal neuro deficit. PSYCH: Calm. Normal affect.   Procedures:  5/25-EGD  Microbiology summarized: MRSA PCR screen nonreactive.  Assessment and Plan: * Multifocal pneumonia CT chest concerning for multifocal pneumonia.  He also have history of rheumatoid arthritis with pulmonary involvement. He is on multiple  immunosuppressants for rheumatoid arthritis.  He is also at risk for aspiration pneumonia due to dysphagia, dysphagia and recent candidal infection.  Surprisingly, he has not had any fever.  Tmax was 100.1 on arrival.  He has no leukocytosis either.  He  is on room air. -Completed 5 days of IV cefepime and Zithromax and transitioned to Levaquin -Change antibiotic to IV Unasyn for anaerobic coverage -Appreciate help by SLP -Aspiration precaution  Concern for retropharyngeal abscess/posterior pharyngeal perforation CT soft tissue neck on 5/29 raises concern for developing retropharyngeal abscess.  Imaging reviewed by ENT, who didn't appreciate an abscess that warrants this surgical intervention.  Barium swallow study on 5/31 raises concern for posterior pharyngeal perforation per radiology and SLP.  ENT recommends transfer to tertiary center for repair.  EGD on 5/25 normal except for proximal esophageal stricture.  Esophageal biopsy negative for CMV or malignancy. -Starting transfer to tertiary center. -Appreciate help by SLP -Change antibiotic to IV Unasyn.  Addendum [facility], and talked to [name] and ENT on call.  He reviewed patient's imaging, and recommended strict n.p.o., good oral hygiene with good antibiotic coverage and G-tube for feeding to allow the posterior pharyngeal perforation heal on its own. He feels stitching or trying to repair the perforation would cause more tissue damage. He also suggested calling back if changes in patient's condition.  General surgery consulted.  Started IV fluid for hydration.  Rheumatoid arthritis with pulmonary involvement On methotrexate, prednisone, CellCept and Plaquenil at home. -On IV Solu-Medrol per pulmonology.  Steroid-induced hyperglycemia No history of diabetes. Recent Labs  Lab 09/08/21 0747 09/08/21 1150 09/08/21 1615 09/09/21 0849 09/09/21 1257  GLUCAP 249* 205* 270* 263* 173*  Continue sliding scale insulin   Dysphagia/odynophagia Likely due to the above.  Severe protein calorie malnutrition/unintentional weight loss Body mass index is 18.43 kg/m. Nutrition Status: Nutrition Problem: Severe Malnutrition Etiology: chronic illness (RA, rheumatoid lung  disease) Signs/Symptoms: severe fat depletion, mild muscle depletion, moderate muscle depletion, percent weight loss Percent weight loss: 19 % Interventions: Prostat, Tube feeding      DVT prophylaxis:  enoxaparin (LOVENOX) injection 40 mg Start: 09/01/21 2200  Code Status: Full code Family Communication: Updated patient's mother at bedside. Level of care: Progressive Status is: Inpatient Remains inpatient appropriate because: Posterior pharyngeal perforation/possible retropharyngeal abscess   Final disposition: Transfer to tertiary center Consultants:  Pulmonology ENT Gastroenterology  Sch Meds:  Scheduled Meds:  Adalimumab  40 mg Subcutaneous Q14 Days   diclofenac Sodium  2 g Topical QID   enoxaparin (LOVENOX) injection  40 mg Subcutaneous Q24H   feeding supplement (PROSource TF)  45 mL Per Tube BID   fluticasone furoate-vilanterol  1 puff Inhalation Daily   And   umeclidinium bromide  1 puff Inhalation Daily   insulin aspart  0-9 Units Subcutaneous TID WC   methylPREDNISolone (SOLU-MEDROL) injection  40 mg Intravenous Q24H   pantoprazole (PROTONIX) IV  40 mg Intravenous Q24H   pneumococcal 20-valent conjugate vaccine  0.5 mL Intramuscular Tomorrow-1000   Vitamin D (Ergocalciferol)  50,000 Units Oral Weekly   Continuous Infusions:  sodium chloride Stopped (09/04/21 1956)   ampicillin-sulbactam (UNASYN) IV 200 mL/hr at 09/09/21 1753   PRN Meds:.sodium chloride, acetaminophen **OR** acetaminophen, HYDROmorphone (DILAUDID) injection, ipratropium-albuterol, magic mouthwash w/lidocaine, ondansetron **OR** ondansetron (ZOFRAN) IV  Antimicrobials: Anti-infectives (From admission, onward)    Start     Dose/Rate Route Frequency Ordered Stop   09/09/21 1800  Ampicillin-Sulbactam (UNASYN) 3 g in sodium chloride 0.9 % 100 mL  IVPB        3 g 200 mL/hr over 30 Minutes Intravenous Every 6 hours 09/09/21 1530     09/08/21 1000  levofloxacin (LEVAQUIN) tablet 750 mg  Status:   Discontinued        750 mg Per Tube Daily 09/08/21 0752 09/09/21 1530   09/02/21 2000  azithromycin (ZITHROMAX) 500 mg in sodium chloride 0.9 % 250 mL IVPB        500 mg 250 mL/hr over 60 Minutes Intravenous Every 24 hours 09/01/21 1929 09/05/21 2034   09/02/21 1330  micafungin (MYCAMINE) 150 mg in sodium chloride 0.9 % 100 mL IVPB  Status:  Discontinued        150 mg 107.5 mL/hr over 1 Hours Intravenous Every 24 hours 09/02/21 1215 09/04/21 1011   09/02/21 0900  sulfamethoxazole-trimethoprim (BACTRIM) 400-80 MG per tablet 1 tablet  Status:  Discontinued        1 tablet Oral Once per day on Mon Wed Fri 09/01/21 1958 09/03/21 0947   09/02/21 0701  ceFEPIme (MAXIPIME) 2 g in sodium chloride 0.9 % 100 mL IVPB  Status:  Discontinued        2 g 200 mL/hr over 30 Minutes Intravenous Every 12 hours 09/01/21 1933 09/01/21 2033   09/02/21 0700  vancomycin (VANCOREADY) IVPB 1250 mg/250 mL  Status:  Discontinued        1,250 mg 166.7 mL/hr over 90 Minutes Intravenous Every 12 hours 09/01/21 2034 09/02/21 1054   09/02/21 0400  ceFEPIme (MAXIPIME) 2 g in sodium chloride 0.9 % 100 mL IVPB        2 g 200 mL/hr over 30 Minutes Intravenous Every 8 hours 09/01/21 2033 09/06/21 2225   09/01/21 2200  hydroxychloroquine (PLAQUENIL) tablet 200 mg  Status:  Discontinued        200 mg Oral 2 times daily 09/01/21 1958 09/02/21 1241   09/01/21 1930  vancomycin (VANCOREADY) IVPB 1500 mg/300 mL        1,500 mg 150 mL/hr over 120 Minutes Intravenous  Once 09/01/21 1859 09/01/21 2206   09/01/21 1930  cefTRIAXone (ROCEPHIN) 2 g in sodium chloride 0.9 % 100 mL IVPB  Status:  Discontinued        2 g 200 mL/hr over 30 Minutes Intravenous Every 24 hours 09/01/21 1929 09/01/21 1929   09/01/21 1915  azithromycin (ZITHROMAX) 500 mg in sodium chloride 0.9 % 250 mL IVPB        500 mg 250 mL/hr over 60 Minutes Intravenous  Once 09/01/21 1909 09/01/21 2118   09/01/21 1900  ceFEPIme (MAXIPIME) 2 g in sodium chloride 0.9 % 100 mL  IVPB        2 g 200 mL/hr over 30 Minutes Intravenous  Once 09/01/21 1859 09/01/21 1945        I have personally reviewed the following labs and images: CBC: Recent Labs  Lab 09/08/21 0513 09/09/21 0545  WBC 4.8 4.3  HGB 12.3* 12.1*  HCT 37.3* 35.4*  MCV 91.9 91.2  PLT 272 283   BMP &GFR Recent Labs  Lab 09/03/21 0806 09/05/21 1145 09/05/21 1623 09/06/21 0351 09/06/21 1704 09/07/21 0437 09/08/21 0513 09/09/21 0545  NA 132*  --   --   --   --  134* 136 131*  K 3.8 4.7  --  4.2  --  4.0 4.5 3.9  CL 104  --   --   --   --  103 102 99  CO2 21*  --   --   --   --  25 27 26   GLUCOSE 243*  --   --   --   --  227* 215* 292*  BUN 11  --   --   --   --  19 18 18   CREATININE 0.79  --   --   --   --  0.63 0.70 0.57*  CALCIUM 8.7*  --   --   --   --  8.8* 8.9 8.6*  MG  --  1.9 2.0 1.9 1.9  --   --   --   PHOS  --  2.6 3.5 3.2 3.0  --   --   --    Estimated Creatinine Clearance: 117.5 mL/min (A) (by C-G formula based on SCr of 0.57 mg/dL (L)). Liver & Pancreas: No results for input(s): AST, ALT, ALKPHOS, BILITOT, PROT, ALBUMIN in the last 168 hours. No results for input(s): LIPASE, AMYLASE in the last 168 hours. No results for input(s): AMMONIA in the last 168 hours. Diabetic: No results for input(s): HGBA1C in the last 72 hours. Recent Labs  Lab 09/08/21 1150 09/08/21 1615 09/09/21 0849 09/09/21 1257 09/09/21 1707  GLUCAP 205* 270* 263* 173* 185*   Cardiac Enzymes: No results for input(s): CKTOTAL, CKMB, CKMBINDEX, TROPONINI in the last 168 hours. No results for input(s): PROBNP in the last 8760 hours. Coagulation Profile: No results for input(s): INR, PROTIME in the last 168 hours. Thyroid Function Tests: No results for input(s): TSH, T4TOTAL, FREET4, T3FREE, THYROIDAB in the last 72 hours. Lipid Profile: No results for input(s): CHOL, HDL, LDLCALC, TRIG, CHOLHDL, LDLDIRECT in the last 72 hours. Anemia Panel: No results for input(s): VITAMINB12, FOLATE,  FERRITIN, TIBC, IRON, RETICCTPCT in the last 72 hours. Urine analysis: No results found for: COLORURINE, APPEARANCEUR, LABSPEC, PHURINE, GLUCOSEU, HGBUR, BILIRUBINUR, KETONESUR, PROTEINUR, UROBILINOGEN, NITRITE, LEUKOCYTESUR Sepsis Labs: Invalid input(s): PROCALCITONIN, [place]  Microbiology: Recent Results (from the past 240 hour(s))  Surgical pcr screen     Status: None   Collection Time: 09/01/21 10:03 PM   Specimen: Nasal Mucosa; Nasal Swab  Result Value Ref Range Status   MRSA, PCR NEGATIVE NEGATIVE Final   Staphylococcus aureus NEGATIVE NEGATIVE Final    Comment: (NOTE) The Xpert SA Assay (FDA approved for NASAL specimens in patients 42 years of age and older), is one component of a comprehensive surveillance program. It is not intended to diagnose infection nor to guide or monitor treatment. Performed at [facility], [address], [place], [place] [address]     Radiology Studies: No results found.    Avani Sensabaugh T. Whittingham  If 7PM-7AM, please contact night-coverage [email or web address] 09/09/2021, 6:37 PM

## 2021-09-09 NOTE — Evaluation (Addendum)
Objective Swallowing Evaluation: Type of Study: MBS-Modified Barium Swallow Study   Patient Details  Name: Sean [name] MRN: [id number] Date of Birth: 08/27/1975  Today's Date: 09/09/2021 Time: SLP Start Time (ACUTE ONLY): 1100 -SLP Stop Time (ACUTE ONLY): 1200  SLP Time Calculation (min) (ACUTE ONLY): 60 min   Past Medical History:  Past Medical History:  Diagnosis Date   H/O immunosuppressive therapy    Hyperpigmentation of skin    Hyperpigmentation rash/vesicles of the palm and face   Neutropenia (HCC)    RA (rheumatoid arthritis) ([place])    Past Surgical History:  Past Surgical History:  Procedure Laterality Date   ESOPHAGOGASTRODUODENOSCOPY N/A 09/03/2021   Procedure: ESOPHAGOGASTRODUODENOSCOPY (EGD);  Surgeon: Lesly Rubenstein, MD;  Location: [facility] ENDOSCOPY;  Service: Endoscopy;  Laterality: N/A;   HPI: Pt is a 46 y.o. 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  He denied any nausea or vomiting or abdominal pain.  Pt was admitted w/ Bilateral pneumonia  -  continue antibiotic therapy with IV cefepime and Zithromax as well as vancomycin given his immunosuppressed status;  Rheumatoid arthritis involving multiple sites with positive rheumatoid factor ([place]); Steroid-induced hyperglycemia.    CT of Chest: Extensive pneumomediastinum as previously demonstrated. Contrast  filled esophagus is patent and no extraluminal contrast  extravasation is identified.  2. Multiple focal and confluent infiltrates are scattered throughout  the lungs, suggesting multifocal pneumonia. Small bilateral pleural  effusions. Also noted results of CT of Spine: Gas in the prevertebral space, carotid  space, and deep cervical  space on the right. This is consistent gas dissecting upwards from  the known pneumomediastinum.   MBSS completed on 5/26 w/ recommendation for NPO status -- see report. Repeat MBSS TBD.   Subjective: pt awake, verbal and engaged fully w/ this Clinician during the MBSS following all instructions    Recommendations for follow up therapy are one component of a multi-disciplinary discharge planning process, led by the attending physician.  Recommendations may be updated based on patient status, additional functional criteria and insurance authorization.  Assessment / Plan / Recommendation     09/09/2021    3:00 PM  Clinical Impressions  Clinical Impression A repeat MBSS revealed apparent improvement in pharyngeal wall edema in cervical neck since last MBSS(09/04/21) w/ improved pharyngeal swallow function and No aspiration during po trials of thin and Nectar liquids, puree, minced/moistened solids. The swallow function and efficiency appeared to have improved somewhat during the pharyngeal phase w/ increased pharyngeal movement, peristalsis, and epiglottic inversion. Airway protection was adequate w/ No aspiration nor laryngeal penetration occurring during or post trials, though MOD pharyngeal residue remained post initial swallow. Pt was able to use strategies of f/u, Dry swallows to reduce the pharyngeal residue.   However, during the study, there was an apparent communication between the proximal pharyngo-Esophageal wall (posterior wall) resulting in contrast moving into the retropharyngeal space. It is difficult to determine the exact location of the entrance where contrast crosses through, but contrasted bolus residue from trial consistencies did enter the retropharyngeal space. Post assessment of the MBSS by ENT indicated the same concern.  During the Oral phase, pt demonstrated adequate bolus management, timely A-P transfer, and oral clearing. Mastication efficiency was  appropriate. During the Pharyngeal  phase, the edema along the posterior pharyngeal wall was reduced from the C2-~C6; min more edema noted still at the C4-5 area approximately(though reduced since precious study). Improved laryngeal excursion w/ anterior movement noted; epiglottic inversion was noted during more swallows(especially effortful swallows), and pharyngeal peristalsis appeared more efficient w/ reduced bolus residue remaining post initial swallow. MIN-MOD pharyngeal residue remained(MOD residue w/ increased textured trials), however, d/u, Dry swallows reduced this residue and alternating b/t food/liquid appeared to aid reduction as well. Pt was instructed to use effortful swallows. Head turn/chin tuck strategies were not more successful than repeat, effortful swallows. NO aspiration nor laryngeal penetration occurred during the study. Improved bolus motility through the Cervical Esophagus was noted w/ no stasis nor retrograde activity in the viewable Esophagus during the study.  Overall, this is an improvement from previous study on 09/04/21 suspect d/t the reduction in the pharyngeal wall edema allowing for improved efficacy of pharyngeal phase swallowing.     Pt is recommended to be NPO w/ oral care and hygiene; discussion of alternative means of feeding (PEG) being completed by ENT/MD w/ pt and family. ST services will f/u w/ education w/ pt and family re: oral care and exs to maintain swallow musculature.   SLP Visit Diagnosis Dysphagia, pharyngeal phase (R13.13);Dysphagia, pharyngoesophageal phase (R13.14)  Impact on safety and function Risk for inadequate nutrition/hydration;Mild aspiration risk         09/09/2021    3:00 PM  Treatment Recommendations  Treatment Recommendations Patient unable to participate in swallow therapy at this time        09/09/2021    3:00 PM  Prognosis  Prognosis for Safe Diet Advancement Guarded  Barriers to Reach Goals Severity of deficits   Barriers/Prognosis Comment apparent improvement in pharyngeal wall edema in cervical neck since last mbss; swallow has improved somewhat.  However, there is concern of perforation of the proximal Esophageal (posterior) wall resulting in contrast moving into the retropharyngeal space.       09/09/2021    3:00 PM  Diet Recommendations  SLP Diet Recommendations --  Medication Administration Via alternative means  Compensations Minimize environmental distractions;Slow rate  Postural Changes Seated upright at 90 degrees         09/09/2021    3:00 PM  Other Recommendations  Recommended Consults Consider ENT evaluation  Oral Care Recommendations Patient independent with oral care;Oral care before and after PO;Oral care QID  Other Recommendations --  Follow Up Recommendations Follow physician's recommendations for discharge plan and follow up therapies  Assistance recommended at discharge None  Functional Status Assessment Patient has had a recent decline in their functional status and/or demonstrates limited ability to make significant improvements in function in a reasonable and predictable amount of time       09/09/2021    3:00 PM  Frequency and Duration   Speech Therapy Frequency (ACUTE ONLY) --  Treatment Duration --         09/09/2021    3:00 PM  Oral Phase  Oral Phase [facility]       09/09/2021    3:00 PM  Pharyngeal Phase  Pharyngeal Phase Impaired  Pharyngeal- Pudding Teaspoon Pharyngeal residue - pyriform;Pharyngeal residue - valleculae;Pharyngeal residue - posterior pharnyx;Lateral channel residue;Reduced epiglottic inversion;Reduced pharyngeal peristalsis;Reduced anterior laryngeal mobility;Reduced laryngeal elevation  Pharyngeal Material does not enter airway  Pharyngeal- Nectar Teaspoon Reduced epiglottic inversion;Reduced pharyngeal peristalsis;Reduced anterior laryngeal mobility;Reduced laryngeal elevation;Pharyngeal residue - valleculae;Pharyngeal residue -  pyriform;Pharyngeal residue - posterior pharnyx;Lateral channel residue  Pharyngeal  Material does not enter airway  Pharyngeal- Nectar Cup Reduced pharyngeal peristalsis;Reduced epiglottic inversion;Reduced anterior laryngeal mobility;Reduced laryngeal elevation;Pharyngeal residue - valleculae;Pharyngeal residue - pyriform;Pharyngeal residue - posterior pharnyx;Lateral channel residue  Pharyngeal Material does not enter airway  Pharyngeal- Thin Cup Reduced pharyngeal peristalsis;Reduced epiglottic inversion;Reduced anterior laryngeal mobility;Reduced laryngeal elevation;Pharyngeal residue - pyriform;Pharyngeal residue - posterior pharnyx;Pharyngeal residue - valleculae;Lateral channel residue  Pharyngeal Material does not enter airway  Pharyngeal- Puree Reduced pharyngeal peristalsis;Reduced epiglottic inversion;Reduced anterior laryngeal mobility;Reduced laryngeal elevation;Pharyngeal residue - pyriform;Pharyngeal residue - valleculae;Pharyngeal residue - posterior pharnyx;Lateral channel residue  Pharyngeal Material does not enter airway  Pharyngeal Comment min improved bolus motility through the pharynx w/ less residue remaining post swallow compared to previous study on 09/04/2021        09/09/2021    3:00 PM  Cervical Esophageal Phase   Cervical Esophageal Phase Impaired  Cervical Esophageal Comment decreased bolus motility through the UES and cervical esophagus.         Orinda Kenner, MS, CCC-SLP Speech Language Pathologist Rehab Services; [place] [phone] ([email or web address]) Socrates Cahoon 09/09/2021, 5:00 PM

## 2021-09-09 NOTE — Progress Notes (Signed)
PULMONOLOGY         Date: 09/09/2021,   MRN# [id number] Sean [name] 08/29/1975     AdmissionWeight: 72.6 kg                 CurrentWeight: 70.5 kg  Referring provider: Dr. Dareen Piano   CHIEF COMPLAINT:   Forceful cough with pneumomediastinum   HISTORY OF PRESENT ILLNESS   This is a 46 pleasant patient with a history of severe rheumatoid arthritis, seen previously while hospitalized due to bilateral multifocal pneumonia with treatment for community-acquired pneumonia and resolution.  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  Patient has been progressively dyspneic with reduction in spirometry performed in clinic via PFT.  Additional evaluation with rheumatology status post initiation of Humira infusions as well as CellCept and chronic prednisone use at higher dose starting 60 mg with slow taper.  On previous admission he also had moderate pleural effusion with resolution postthoracentesis and clinical improvement patient develops worsening symptoms when tapering below 45 mg daily including scaling of skin fingers and skin redness and erythema around nasolabial folds and worsening dyspnea and stiffness.  He came in on this admission with chest discomfort and severe dysphagia with cough.  He was seen in clinic and noted to have severe oral thrush status post ear nose and throat evaluation with course of Diflucan and partial resolution.  On examination there is still erythema in the upper airway and lower airway as well as remnants of thrush visible in the lower airway.  CT chest independently reviewed with findings of pneumomediastinum without esophageal perforation.  Overall interval improvement in lung parenchyma bilaterally.  Pulmonary critical care consultation placed for additional evaluation management   09/03/21-  patient seen at bedside. He has continued dysphagia  worse with pill swallowing. I have changed most of his PO meds to IV for now. Discussed medical plan with family and patient at bedside. Reviewed case with GI today there is plan for EGD. Reviewed CXR this am, mild b/l pleural effusions worse from prior but not clinicallyh significant, pneumonmediastinum is stable. Will continue to follow.   09/04/21 - patient seems improved.  He continues to have pain with swallowing. 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 There were biopsies done. We will obtain CT neck to eval cervical spine  09/07/21- patient clinically improved with less neck pain and less dysphagia. He had repeat CT neck with findings of fluid collection possible abscess.  Reviewed with Dr Dareen Piano and sent message to Dr Elenore Rota ENT for evaluation. He remains on cefepime.   09/08/21- Patient is s/p ENT evaluation with no findings to suggest abscess. Ive ordered repeat SLP to allow patient PO nourishement. He remains on levofloxacin and solumedrol for now and is clinically improved.   09/09/21- patient had eventful day. He is s/p SLP with findings of perforated hypopharynx and s/p ENT evaluation. He is to be NPO for now and possibly transfer to tertiary facility that may be able to offer head & neck surgery. He is reporting improvement in dysphagia and neck pain. 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    PAST MEDICAL HISTORY   Past Medical History:  Diagnosis Date   H/O immunosuppressive therapy    Hyperpigmentation of skin    Hyperpigmentation rash/vesicles of the palm and face   Neutropenia (HCC)    RA (rheumatoid arthritis) (HCC)  SURGICAL HISTORY   Past Surgical History:  Procedure Laterality Date   ESOPHAGOGASTRODUODENOSCOPY N/A 09/03/2021   Procedure: ESOPHAGOGASTRODUODENOSCOPY (EGD);  Surgeon: Regis Bill, MD;  Location: [facility] ENDOSCOPY;  Service: Endoscopy;  Laterality: N/A;     FAMILY HISTORY   Family History   Problem Relation Age of Onset   Hyperlipidemia Mother    Hypertension Father      SOCIAL HISTORY   Social History   Tobacco Use   Smoking status: Former    Types: Cigarettes   Smokeless tobacco: Never  Vaping Use   Vaping Use: Never used  Substance Use Topics   Alcohol use: Not Currently   Drug use: Not Currently    Types: Marijuana     MEDICATIONS    Home Medication:    Current Medication:  Current Facility-Administered Medications:    0.9 %  sodium chloride infusion, , Intravenous, PRN, Leeroy Bock, MD, Stopped at 09/04/21 1956   acetaminophen (TYLENOL) tablet 650 mg, 650 mg, Oral, Q6H PRN, 650 mg at 09/04/21 1000 **OR** acetaminophen (TYLENOL) suppository 650 mg, 650 mg, Rectal, Q6H PRN, Mansy, Jan A, MD   Adalimumab PNKT 40 mg, 40 mg, Subcutaneous, Q14 Days, Leeroy Bock, MD, 40 mg at 09/03/21 1610   diclofenac Sodium (VOLTAREN) 1 % topical gel 2 g, 2 g, Topical, QID, Jamelle Rushing L, MD, 2 g at 09/08/21 2253   enoxaparin (LOVENOX) injection 40 mg, 40 mg, Subcutaneous, Q24H, Mansy, Jan A, MD, 40 mg at 09/08/21 2253   feeding supplement (OSMOLITE 1.5 CAL) liquid 1,500 mL, 1,500 mL, Per Tube, Continuous, Leeroy Bock, MD, Last Rate: 75 mL/hr at 09/09/21 0551, Infusion Verify at 09/09/21 0551   feeding supplement (PROSource TF) liquid 45 mL, 45 mL, Per Tube, BID, Leeroy Bock, MD, 45 mL at 09/08/21 2253   fluticasone furoate-vilanterol (BREO ELLIPTA) 200-25 MCG/ACT 1 puff, 1 puff, Inhalation, Daily, 1 puff at 09/08/21 0939 **AND** umeclidinium bromide (INCRUSE ELLIPTA) 62.5 MCG/ACT 1 puff, 1 puff, Inhalation, Daily, Mansy, Jan A, MD, 1 puff at 09/08/21 9604   free water 145 mL, 145 mL, Per Tube, Q4H, Leeroy Bock, MD, 145 mL at 09/09/21 0400   HYDROmorphone (DILAUDID) injection 0.5 mg, 0.5 mg, Intravenous, Q4H PRN, Leeroy Bock, MD, 0.5 mg at 09/09/21 0550   insulin aspart (novoLOG) injection 0-9 Units, 0-9 Units,  Subcutaneous, TID WC, Leeroy Bock, MD, 5 Units at 09/08/21 1705   ipratropium-albuterol (DUONEB) 0.5-2.5 (3) MG/3ML nebulizer solution 3 mL, 3 mL, Nebulization, Q4H PRN, Mansy, Jan A, MD   levofloxacin (LEVAQUIN) tablet 750 mg, 750 mg, Per Tube, Daily, Leeroy Bock, MD, 750 mg at 09/08/21 5409   magic mouthwash w/lidocaine, 5 mL, Oral, TID PRN, Leeroy Bock, MD, 5 mL at 09/08/21 1145   magnesium hydroxide (MILK OF MAGNESIA) suspension 30 mL, 30 mL, Oral, Daily PRN, Mansy, Jan A, MD   methylPREDNISolone sodium succinate (SOLU-MEDROL) 40 mg/mL injection 40 mg, 40 mg, Intravenous, Q24H, Ciaira Natividad, MD, 40 mg at 09/08/21 0937   ondansetron (ZOFRAN) tablet 4 mg, 4 mg, Oral, Q6H PRN **OR** ondansetron (ZOFRAN) injection 4 mg, 4 mg, Intravenous, Q6H PRN, Mansy, Jan A, MD   pantoprazole (PROTONIX) injection 40 mg, 40 mg, Intravenous, Q24H, Shariyah Eland, MD, 40 mg at 09/08/21 0936   pneumococcal 20-valent conjugate vaccine (PREVNAR 20) injection 0.5 mL, 0.5 mL, Intramuscular, Tomorrow-1000, Mansy, Jan A, MD   sucralfate (CARAFATE) 1 GM/10ML suspension 1 g, 1 g, Oral, Once, Shaune Pollack, MD  Vitamin D (Ergocalciferol) (DRISDOL) capsule 50,000 Units, 50,000 Units, Oral, Weekly, Mansy, Jan A, MD, 50,000 Units at 09/02/21 0900    ALLERGIES   Patient has no known allergies.     REVIEW OF SYSTEMS    Review of Systems:  Gen:  Denies  fever, sweats, chills weigh loss  HEENT: Denies blurred vision, double vision, ear pain, eye pain, hearing loss, nose bleeds, sore throat Cardiac:  No dizziness, chest pain or heaviness, chest tightness,edema Resp:   reports dyspnea chronically  Gi: Denies swallowing difficulty, stomach pain, nausea or vomiting, diarrhea, constipation, bowel incontinence Gu:  Denies bladder incontinence, burning urine Ext:   Denies Joint pain, stiffness or swelling Skin: Denies  skin rash, easy bruising or bleeding or hives Endoc:  Denies polyuria,  polydipsia , polyphagia or weight change Psych:   Denies depression, insomnia or hallucinations   Other:  All other systems negative   VS: BP 108/66 (BP Location: Right Arm)   Pulse 88   Temp 98.3 F (36.8 C)   Resp 18   Ht  (1.956 m)   Wt 70.5 kg   SpO2 96%   BMI 18.43 kg/m      PHYSICAL EXAM    GENERAL:NAD, no fevers, chills, no weakness no fatigue HEAD: Normocephalic, atraumatic.  EYES: Pupils equal, round, reactive to light. Extraocular muscles intact. No scleral icterus.  MOUTH: Moist mucosal membrane. Dentition intact. No abscess noted.  EAR, NOSE, THROAT: Clear without exudates. No external lesions.  NECK: Supple. No thyromegaly. No nodules. No JVD.  PULMONARY: decreased breath sounds with mild rhonchi worse at bases bilaterally.  CARDIOVASCULAR: S1 and S2. Regular rate and rhythm. No murmurs, rubs, or gallops. No edema. Pedal pulses 2+ bilaterally.  GASTROINTESTINAL: Soft, nontender, nondistended. No masses. Positive bowel sounds. No hepatosplenomegaly.  MUSCULOSKELETAL: No swelling, clubbing, or edema. Range of motion full in all extremities.  NEUROLOGIC: Cranial nerves II through XII are intact. No gross focal neurological deficits. Sensation intact. Reflexes intact.  SKIN: No ulceration, lesions, rashes, or cyanosis. Skin warm and dry. Turgor intact.  PSYCHIATRIC: Mood, affect within normal limits. The patient is awake, alert and oriented x 3. Insight, judgment intact.       IMAGING        ASSESSMENT/PLAN   Subacute chest discomfort and severe dysphagia Secondary to forceful cough likely due to recurrent candidiasis with thrush -Failure of Diflucan p.o. and outpatient status post ENT evaluation -s/p micafungin IV for now -Supportive care for pneumomediastinum including reduction of shearing forces, avoid incentive spirometry, BiPAP/CPAP, flutter valve -Agree with empiric cefepime and Zithromax narrowed to levofloxacin -GI consultation-s/p  EGD -continue nasal canula O2 for supportive care to allow diffusion of nitrogen rich gas out of soft tissue   Severe dysphagia   - s/p EGD - severe narrowing of distal esophagus s/p biopsies  - CT cervical spine today -speech and swallow evaluation- nPO -repeat barium SLP - perforated hypopharynx  Recurrent bilateral pleural effusion Previous thoracentesis with fluid pattern showing mildly ascitic eosinophilic predominant exudate consistent with rheumatoid associated effusions -Have DC'd IVF due to likely contributing to redevelopment of pleural effusion -Clinically patient is not dehydrated   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     Thank you for allowing me to participate in  the care of this patient.   Patient/Family are satisfied with care plan and all questions have been answered.    Provider disclosure: Patient with at least one acute or chronic illness or injury that poses a threat to life or bodily function and is being managed actively during this encounter.  All of the below services have been performed independently by signing provider:  review of prior documentation from internal and or external health records.  Review of previous and current lab results.  Interview and comprehensive assessment during patient visit today. Review of current and previous chest radiographs/CT scans. Discussion of management and test interpretation with health care team and patient/family.   This document was prepared using Dragon voice recognition software and may include unintentional dictation errors.     Ottie Glazier, M.D.  Division of Pulmonary & Critical Care Medicine

## 2021-09-09 NOTE — Consult Note (Signed)
Sean [name], Sean [name] [id number] June 04, 1975 [name], MD  Reason for Consult: Retropharyngeal abscess, dysphagia Requesting Physician: Almon Hercules, MD Consulting Physician: [name], MD  HPI: This 46 y.o. year old male was admitted on 09/01/2021 for Bilateral pneumonia [J18.9].  On initial admission the patient had pneumomediastinum with air additionally in the neck but no obvious source for the air other than the thought this came from coughing.  There was no evidence of any esophageal perforation initially.  He has had some dysphagia and underwent an EGD on the 25th.  Modified barium swallow the next day showed quite a bit of edema in the region of the hypopharynx which contributed to issues with aspiration and he has been strict n.p.o.  Today he had a follow-up modified barium swallow.  There is clearly a communication between the lower hypopharynx posteriorly and the retropharyngeal space as contrast did enter the retropharyngeal space.  The patient continues to do well clinically with no fevers, continued improvement in throat and neck pain.  Medications:  Current Facility-Administered Medications  Medication Dose Route Frequency Provider Last Rate Last Admin   0.9 %  sodium chloride infusion   Intravenous PRN Leeroy Bock, MD   Stopped at 09/04/21 1956   acetaminophen (TYLENOL) tablet 650 mg  650 mg Oral Q6H PRN Mansy, Jan A, MD   650 mg at 09/04/21 1000   Or   acetaminophen (TYLENOL) suppository 650 mg  650 mg Rectal Q6H PRN Mansy, Jan A, MD       Adalimumab PNKT 40 mg  40 mg Subcutaneous Q14 Days Leeroy Bock, MD   40 mg at 09/03/21 0912   Ampicillin-Sulbactam (UNASYN) 3 g in sodium chloride 0.9 % 100 mL IVPB  3 g Intravenous Q6H Gonfa, Taye T, MD 200 mL/hr at 09/09/21 1750 3 g at 09/09/21 1750   diclofenac Sodium (VOLTAREN) 1 % topical gel 2 g  2 g Topical QID Leeroy Bock, MD   2 g at 09/09/21 1301   enoxaparin (LOVENOX) injection 40 mg  40 mg Subcutaneous  Q24H Mansy, Jan A, MD   40 mg at 09/08/21 2253   feeding supplement (PROSource TF) liquid 45 mL  45 mL Per Tube BID Leeroy Bock, MD   45 mL at 09/09/21 0902   fluticasone furoate-vilanterol (BREO ELLIPTA) 200-25 MCG/ACT 1 puff  1 puff Inhalation Daily Mansy, Jan A, MD   1 puff at 09/09/21 0934   And   umeclidinium bromide (INCRUSE ELLIPTA) 62.5 MCG/ACT 1 puff  1 puff Inhalation Daily Mansy, Jan A, MD   1 puff at 09/09/21 0910   HYDROmorphone (DILAUDID) injection 0.5 mg  0.5 mg Intravenous Q4H PRN Leeroy Bock, MD   0.5 mg at 09/09/21 1429   insulin aspart (novoLOG) injection 0-9 Units  0-9 Units Subcutaneous TID WC Leeroy Bock, MD   2 Units at 09/09/21 1301   ipratropium-albuterol (DUONEB) 0.5-2.5 (3) MG/3ML nebulizer solution 3 mL  3 mL Nebulization Q4H PRN Mansy, Jan A, MD       magic mouthwash w/lidocaine  5 mL Oral TID PRN Leeroy Bock, MD   5 mL at 09/09/21 0906   magnesium hydroxide (MILK OF MAGNESIA) suspension 30 mL  30 mL Oral Daily PRN Mansy, Jan A, MD       methylPREDNISolone sodium succinate (SOLU-MEDROL) 40 mg/mL injection 40 mg  40 mg Intravenous Q24H Vida Rigger, MD   40 mg at 09/09/21 0902   ondansetron (ZOFRAN) tablet  4 mg  4 mg Oral Q6H PRN Mansy, Jan A, MD       Or   ondansetron [facility]) injection 4 mg  4 mg Intravenous Q6H PRN Mansy, Jan A, MD       pantoprazole (PROTONIX) injection 40 mg  40 mg Intravenous Q24H Vida Rigger, MD   40 mg at 09/09/21 0902   pneumococcal 20-valent conjugate vaccine (PREVNAR 20) injection 0.5 mL  0.5 mL Intramuscular Tomorrow-1000 Mansy, Jan A, MD       sucralfate (CARAFATE) 1 GM/10ML suspension 1 g  1 g Oral Once Shaune Pollack, MD       Vitamin D (Ergocalciferol) (DRISDOL) capsule 50,000 Units  50,000 Units Oral Weekly Mansy, Vernetta Honey, MD   50,000 Units at 09/09/21 2440  .  Medications Prior to Admission  Medication Sig Dispense Refill   BREZTRI AEROSPHERE 160-9-4.8 MCG/ACT AERO Inhale 2 puffs into the lungs 2  (two) times daily.     hydroxychloroquine (PLAQUENIL) 200 MG tablet Take 1 tablet by mouth 2 (two) times daily.     mycophenolate (CELLCEPT) 500 MG tablet Take 1 tablet by mouth every 12 (twelve) hours.     predniSONE (DELTASONE) 10 MG tablet 6 tabs (60 mg)po daily for one week; then taper  every week until down to 10 mg daily 250 tablet 0   sulfamethoxazole-trimethoprim (BACTRIM) 400-80 MG tablet Take 1 tablet by mouth 3 (three) times a week.     folic acid (FOLVITE) 1 MG tablet Take 1 mg by mouth daily. (Patient not taking: Reported on 09/01/2021)     methotrexate 2.5 MG tablet Take 5 tablets by mouth every 7 (seven) days. (Patient not taking: Reported on 09/01/2021)     Vitamin D, Ergocalciferol, (DRISDOL) 1.25 MG (50000 UNIT) CAPS capsule Take 50,000 Units by mouth once a week.     witch hazel-glycerin (TUCKS) pad Apply topically as needed for hemorrhoids. (Patient not taking: Reported on 09/01/2021) 40 each 0    Allergies: No Known Allergies  PMH:  Past Medical History:  Diagnosis Date   H/O immunosuppressive therapy    Hyperpigmentation of skin    Hyperpigmentation rash/vesicles of the palm and face   Neutropenia (HCC)    RA (rheumatoid arthritis) (HCC)     Fam Hx:  Family History  Problem Relation Age of Onset   Hyperlipidemia Mother    Hypertension Father     Soc Hx:  Social History   Socioeconomic History   Marital status: Single    Spouse name: Not on file   Number of children: Not on file   Years of education: Not on file   Highest education level: Not on file  Occupational History   Not on file  Tobacco Use   Smoking status: Former    Types: Cigarettes   Smokeless tobacco: Never  Vaping Use   Vaping Use: Never used  Substance and Sexual Activity   Alcohol use: Not Currently   Drug use: Not Currently    Types: Marijuana   Sexual activity: Not on file  Other Topics Concern   Not on file  Social History Narrative   Not on file   Social Determinants of  Health   Financial Resource Strain: Not on file  Food Insecurity: Not on file  Transportation Needs: Not on file  Physical Activity: Not on file  Stress: Not on file  Social Connections: Not on file  Intimate Partner Violence: Not on file    PSH:  Past Surgical History:  Procedure Laterality Date   ESOPHAGOGASTRODUODENOSCOPY N/A 09/03/2021   Procedure: ESOPHAGOGASTRODUODENOSCOPY (EGD);  Surgeon: Regis Bill, MD;  Location: [facility] ENDOSCOPY;  Service: Endoscopy;  Laterality: N/A;  . Procedures since admission: No admission procedures for hospital encounter.  ROS: Review of systems normal other than 12 systems except per HPI.  PHYSICAL EXAM  Vitals: Blood pressure 107/72, pulse 94, temperature 97.8 F (36.6 C), resp. rate 17, height  (1.956 m), weight 70.5 kg, SpO2 100 %.. General: Well-developed, Well-nourished in no acute distress Mood: Mood and affect well adjusted, pleasant and cooperative. Orientation: Grossly alert and oriented. Vocal Quality: No hoarseness. Communicates verbally. head and Face: NCAT. No facial asymmetry. No visible skin lesions. No significant facial scars. No tenderness with sinus percussion. Facial strength normal and symmetric. Neck: Supple and symmetric with no palpable masses, tenderness or crepitance. The trachea is midline. Thyroid gland is soft, nontender and symmetric with no masses or enlargement. Parotid and submandibular glands are soft, nontender and symmetric, without masses.  Lymphatic: Cervical lymph nodes are without palpable lymphadenopathy or tenderness. Respiratory: Normal respiratory effort without labored breathing.  MEDICAL DECISION MAKING: Data Review:  Results for orders placed or performed during the hospital encounter of 09/01/21 (from the past 48 hour(s))  CBC     Status: Abnormal   Collection Time: 09/08/21  5:13 AM  Result Value Ref Range   WBC 4.8 4.0 - 10.5 K/uL   RBC 4.06 (L) 4.22 - 5.81 MIL/uL   Hemoglobin 12.3  (L) 13.0 - 17.0 g/dL   HCT 16.1 (L) 09.6 - 04.5 %   MCV 91.9 80.0 - 100.0 fL   MCH 30.3 26.0 - 34.0 pg   MCHC 33.0 30.0 - 36.0 g/dL   RDW 40.9 81.1 - 91.4 %   Platelets 272 150 - 400 K/uL   nRBC 0.0 0.0 - 0.2 %    Comment: Performed at [facility], [address], [place], [place] [address]  Basic metabolic panel     Status: Abnormal   Collection Time: 09/08/21  5:13 AM  Result Value Ref Range   Sodium 136 135 - 145 mmol/L   Potassium 4.5 3.5 - 5.1 mmol/L   Chloride 102 98 - 111 mmol/L   CO2 27 22 - 32 mmol/L   Glucose, Bld 215 (H) 70 - 99 mg/dL    Comment: Glucose reference range applies only to samples taken after fasting for at least 8 hours.   BUN 18 6 - 20 mg/dL   Creatinine, Ser 6.21 0.61 - 1.24 mg/dL   Calcium 8.9 8.9 - 30.8 mg/dL   GFR, Estimated >65 >78 mL/min    Comment: (NOTE) Calculated using the CKD-EPI Creatinine Equation (2021)    Anion gap 7 5 - 15    Comment: Performed at [facility], [address] [place], [place] [address]  Glucose, capillary     Status: Abnormal   Collection Time: 09/08/21  5:45 AM  Result Value Ref Range   Glucose-Capillary 245 (H) 70 - 99 mg/dL    Comment: Glucose reference range applies only to samples taken after fasting for at least 8 hours.  Glucose, capillary     Status: Abnormal   Collection Time: 09/08/21  7:47 AM  Result Value Ref Range   Glucose-Capillary 249 (H) 70 - 99 mg/dL    Comment: Glucose reference range applies only to samples taken after fasting for at least 8 hours.  Glucose, capillary     Status: Abnormal   Collection  Time: 09/08/21 11:50 AM  Result Value Ref Range   Glucose-Capillary 205 (H) 70 - 99 mg/dL    Comment: Glucose reference range applies only to samples taken after fasting for at least 8 hours.  Glucose, capillary     Status: Abnormal   Collection Time: 09/08/21  4:15 PM  Result Value Ref Range   Glucose-Capillary 270 (H) 70 - 99 mg/dL    Comment: Glucose reference range  applies only to samples taken after fasting for at least 8 hours.  CBC     Status: Abnormal   Collection Time: 09/09/21  5:45 AM  Result Value Ref Range   WBC 4.3 4.0 - 10.5 K/uL   RBC 3.88 (L) 4.22 - 5.81 MIL/uL   Hemoglobin 12.1 (L) 13.0 - 17.0 g/dL   HCT 03.4 (L) 91.7 - 91.5 %   MCV 91.2 80.0 - 100.0 fL   MCH 31.2 26.0 - 34.0 pg   MCHC 34.2 30.0 - 36.0 g/dL   RDW 05.6 97.9 - 48.0 %   Platelets 283 150 - 400 K/uL   nRBC 0.0 0.0 - 0.2 %    Comment: Performed at [facility], [address] [place], [place] [address]  Basic metabolic panel     Status: Abnormal   Collection Time: 09/09/21  5:45 AM  Result Value Ref Range   Sodium 131 (L) 135 - 145 mmol/L   Potassium 3.9 3.5 - 5.1 mmol/L   Chloride 99 98 - 111 mmol/L   CO2 26 22 - 32 mmol/L   Glucose, Bld 292 (H) 70 - 99 mg/dL    Comment: Glucose reference range applies only to samples taken after fasting for at least 8 hours.   BUN 18 6 - 20 mg/dL   Creatinine, Ser 7.48 (L) 0.61 - 1.24 mg/dL   Calcium 8.6 (L) 8.9 - 10.3 mg/dL   GFR, Estimated >27 >07 mL/min    Comment: (NOTE) Calculated using the CKD-EPI Creatinine Equation (2021)    Anion gap 6 5 - 15    Comment: Performed at [facility], [address] [place], [place] [address]  Glucose, capillary     Status: Abnormal   Collection Time: 09/09/21  8:49 AM  Result Value Ref Range   Glucose-Capillary 263 (H) 70 - 99 mg/dL    Comment: Glucose reference range applies only to samples taken after fasting for at least 8 hours.  Glucose, capillary     Status: Abnormal   Collection Time: 09/09/21 12:57 PM  Result Value Ref Range   Glucose-Capillary 173 (H) 70 - 99 mg/dL    Comment: Glucose reference range applies only to samples taken after fasting for at least 8 hours.  Glucose, capillary     Status: Abnormal   Collection Time: 09/09/21  5:07 PM  Result Value Ref Range   Glucose-Capillary 185 (H) 70 - 99 mg/dL    Comment: Glucose reference range applies  only to samples taken after fasting for at least 8 hours.  . No results found..   ASSESSMENT: Patient with perforation of the distal hypopharynx which is in communication with the retropharyngeal space as noted on modified barium swallow.  Fortunately he remains clinically stable with no fever and no evidence of sepsis.  His Dobbhoff tube was removed apparently for the barium swallow.  PLAN: I have talked with the clinical team and my recommendation was transfer to tertiary care center.  This is a complicated case and our local ENT service cannot manage a  perforation of the pharynx like this.  Most likely, should this need eventual repair, thoracic surgery would also need to be involved along with an experienced head neck surgery team.  The hospitalist service [facility].  They have not recommended transfer at this point because they do not feel there is a need for immediate surgical intervention.  His recommendation is that the patient remain strictly n.p.o. with the hopes that the hole will granulate in and heal on its own.  I talked with Dr. Gwenlyn FudgeGoldstein as well and while I do not disagree with this plan, we did discuss that if the patient decompensates clinically (sepsis, mediastinitis), transfer will certainly be necessary.  Clearly he will need to remain on broad-spectrum antibiotics.  In terms of feeding the patient, his recommendation was through a G-tube as a Dobbhoff tube through this area will potentially create irritation in an area that we are hoping will heal spontaneously.  This may have to be done by General Surgery due to concerns of further injury to the perforation by attempting any transoral endoscopy to assist with a G-tube, but I defer that decision to surgery. Parenteral nutrition would be another alternative. We both discussed concerns about healing in this patient who has rheumatoid arthritis and has been on medications for immune suppression which may affect  healing.  Timing for repeat barium swallow is certainly a question, and given the potential for slow healing this might be deferred for 10 to 14 days, but this was not specifically discussed with [name]. In terms of follow-up, if the patient is discharged, follow-up can be arranged by calling [phone], and requesting follow-up with 1 of [name]'s head and neck surgeons, either Dr. Agustina Caroliocke or Dr. Roby LoftsKahmke.   I explained all of this to the patient this afternoon and answered all of his questions.   Sandi MealyPaul S Siara Gorder, MD 09/09/2021 5:50 PM

## 2021-09-09 NOTE — Hospital Course (Addendum)
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  CTA chest on 5/23 negative for PE but acute pneumomediastinum with extension into the lower neck and bilateral lower lobe volume loss/possible consolidation concerning for pneumonia or rheumatoid lung disease.  CT chest with oral contrast on 5/23 confirmed pneumomediastinum and multiple focal and confluent infiltrates scattered throughout the lungs but no contrast extravasation. patient was started on broad-spectrum antibiotics with cefepime and Zithromax.  Pulmonology, GI and ENT consulted.  CT cervical spine without contrast.  CT cervical spine without contrast on 5/26 concerning for gas in the prevertebral space, carotid space, deep cervical space on the right likely an extension from known pneumomediastinum.  Patient had fluoroscopy guided Dobbhoff tube placed on 5/27.  CT soft tissue neck with contrast on 5/29 showed regression of upper pneumomediastinum and soft tissue emphysema but organized retropharyngeal gas or fluid concerning for developing abscess.  Imaging reviewed by me ENT who did not feel there is significant abscess for surgical drain.  NG tube removed on 5/31.  He had a barium swallow study concerning for posterior pharyngeal perforation.  ENT recommended transfer to tertiary center for repair.  Called [facility], and talked to Dr. [name] and ENT on call.  After he reviewed patient's imaging, he recommended strict n.p.o., good oral hygiene with chlorhexidine mouth rinse, good antibiotic coverage and G-tube for feeding to allow the posterior pharyngeal perforation heal on its own. He feels stitching or trying to repair  the perforation would cause more tissue damage.  Dr. [name] did not feel transfer to [name] is necessary.  However, he suggested calling back if patient is worse.    IR placed G-tube on 6/2.  Bolus tube feeding started on 6/3.  Discharged home on 6/5.

## 2021-09-09 NOTE — Progress Notes (Signed)
Mobility Specialist - Progress Note   09/09/21 1518  Mobility  Activity Ambulated independently in hallway;Stood at bedside  Level of Assistance Independent  Assistive Device None  Distance Ambulated (ft) 160 ft  Activity Response Tolerated well  $Mobility charge 1 Mobility     Pt sitting EOB upon arrival using RA. Completes all activities indep voicing no complaints and returns EOB with needs in reach.  Clarisa SchoolsMarcianna Lashante Fryberger Mobility Specialist 09/09/21, 3:19 PM

## 2021-09-09 NOTE — Assessment & Plan Note (Signed)
Likely due to the above.

## 2021-09-09 NOTE — Assessment & Plan Note (Signed)
Body mass index is 18.56 kg/m. Nutrition Problem: Severe Malnutrition Etiology: chronic illness (RA, rheumatoid lung disease) Signs/Symptoms: severe fat depletion, mild muscle depletion, moderate muscle depletion, percent weight loss Percent weight loss: 19 % Interventions: Prostat, Tube feeding

## 2021-09-10 ENCOUNTER — Inpatient Hospital Stay: Payer: 59

## 2021-09-10 DIAGNOSIS — R739 Hyperglycemia, unspecified: Secondary | ICD-10-CM | POA: Diagnosis not present

## 2021-09-10 DIAGNOSIS — J390 Retropharyngeal and parapharyngeal abscess: Secondary | ICD-10-CM | POA: Diagnosis not present

## 2021-09-10 DIAGNOSIS — M0579 Rheumatoid arthritis with rheumatoid factor of multiple sites without organ or systems involvement: Secondary | ICD-10-CM | POA: Diagnosis not present

## 2021-09-10 DIAGNOSIS — J189 Pneumonia, unspecified organism: Secondary | ICD-10-CM | POA: Diagnosis not present

## 2021-09-10 DIAGNOSIS — R748 Abnormal levels of other serum enzymes: Secondary | ICD-10-CM

## 2021-09-10 LAB — PHOSPHORUS: Phosphorus: 4.1 mg/dL (ref 2.5–4.6)

## 2021-09-10 LAB — COMPREHENSIVE METABOLIC PANEL
ALT: 85 U/L — ABNORMAL HIGH (ref 0–44)
AST: 68 U/L — ABNORMAL HIGH (ref 15–41)
Albumin: 2.6 g/dL — ABNORMAL LOW (ref 3.5–5.0)
Alkaline Phosphatase: 45 U/L (ref 38–126)
Anion gap: 4 — ABNORMAL LOW (ref 5–15)
BUN: 15 mg/dL (ref 6–20)
CO2: 28 mmol/L (ref 22–32)
Calcium: 8.8 mg/dL — ABNORMAL LOW (ref 8.9–10.3)
Chloride: 105 mmol/L (ref 98–111)
Creatinine, Ser: 0.79 mg/dL (ref 0.61–1.24)
GFR, Estimated: >60 mL/min (ref >60)
Glucose, Bld: 104 mg/dL — ABNORMAL HIGH (ref 70–99)
Potassium: 4.8 mmol/L (ref 3.5–5.1)
Sodium: 137 mmol/L (ref 135–145)
Total Bilirubin: 0.5 mg/dL (ref 0.3–1.2)
Total Protein: 6.6 g/dL (ref 6.5–8.1)

## 2021-09-10 LAB — GLUCOSE, CAPILLARY
Glucose-Capillary: 102 mg/dL — ABNORMAL HIGH (ref 70–99)
Glucose-Capillary: 122 mg/dL — ABNORMAL HIGH (ref 70–99)
Glucose-Capillary: 183 mg/dL — ABNORMAL HIGH (ref 70–99)
Glucose-Capillary: 205 mg/dL — ABNORMAL HIGH (ref 70–99)
Glucose-Capillary: 93 mg/dL (ref 70–99)

## 2021-09-10 LAB — HEMOGLOBIN A1C
Hgb A1c MFr Bld: 11.9 % — ABNORMAL HIGH (ref 4.8–5.6)
Mean Plasma Glucose: 294.83 mg/dL

## 2021-09-10 LAB — MAGNESIUM: Magnesium: 1.6 mg/dL — ABNORMAL LOW (ref 1.7–2.4)

## 2021-09-10 LAB — CBC WITH DIFFERENTIAL/PLATELET
Abs Immature Granulocytes: 0.07 10*3/uL (ref 0.00–0.07)
Basophils Absolute: 0 10*3/uL (ref 0.0–0.1)
Basophils Relative: 0 %
Eosinophils Absolute: 0 10*3/uL (ref 0.0–0.5)
Eosinophils Relative: 1 %
HCT: 36 % — ABNORMAL LOW (ref 39.0–52.0)
Hemoglobin: 12 g/dL — ABNORMAL LOW (ref 13.0–17.0)
Immature Granulocytes: 1 %
Lymphocytes Relative: 23 %
Lymphs Abs: 1.3 10*3/uL (ref 0.7–4.0)
MCH: 30.9 pg (ref 26.0–34.0)
MCHC: 33.3 g/dL (ref 30.0–36.0)
MCV: 92.8 fL (ref 80.0–100.0)
Monocytes Absolute: 0.7 10*3/uL (ref 0.1–1.0)
Monocytes Relative: 13 %
Neutro Abs: 3.4 10*3/uL (ref 1.7–7.7)
Neutrophils Relative %: 62 %
Platelets: 283 10*3/uL (ref 150–400)
RBC: 3.88 MIL/uL — ABNORMAL LOW (ref 4.22–5.81)
RDW: 12.7 % (ref 11.5–15.5)
WBC: 5.6 10*3/uL (ref 4.0–10.5)
nRBC: 0 % (ref 0.0–0.2)

## 2021-09-10 MED ORDER — CHLORHEXIDINE GLUCONATE 0.12 % MT SOLN
15.0000 mL | Freq: Two times a day (BID) | OROMUCOSAL | Status: DC
  Administered 2021-09-10 – 2021-09-14 (×9): 15 mL via OROMUCOSAL
  Filled 2021-09-10 (×8): qty 15

## 2021-09-10 MED ORDER — CEFAZOLIN SODIUM-DEXTROSE 2-4 GM/100ML-% IV SOLN
2.0000 g | INTRAVENOUS | Status: AC
  Administered 2021-09-11: 2 g via INTRAVENOUS
  Filled 2021-09-10: qty 100

## 2021-09-10 MED ORDER — MAGNESIUM SULFATE 2 GM/50ML IV SOLN
2.0000 g | Freq: Once | INTRAVENOUS | Status: AC
  Administered 2021-09-10: 2 g via INTRAVENOUS
  Filled 2021-09-10: qty 50

## 2021-09-10 NOTE — Progress Notes (Signed)
Mobility Specialist - Progress Note   09/10/21 1152  Mobility  Activity Ambulated independently in hallway;Stood at bedside;Dangled on edge of bed  Level of Assistance Independent  Assistive Device None  Distance Ambulated (ft) 160 ft  Activity Response Tolerated well  $Mobility charge 1 Mobility     Pt supine upon arrival using RA. Completes all activities indep and ambulates voicing no complaints. Declines second lap around NS d/t wanting rest before upcoming procedure. Tolerates well and returns to bed with needs in reach.   Clarisa SchoolsMarcianna Brayden Betters Mobility Specialist 09/10/21, 11:54 AM

## 2021-09-10 NOTE — Assessment & Plan Note (Addendum)
Recent Labs  Lab 09/10/21 0532 09/11/21 0511 09/12/21 0331 09/13/21 0502  AST 68*  --  59*  --   ALT 85*  --  79*  --   ALKPHOS 45  --  45  --   BILITOT 0.5  --  0.5  --   PROT 6.6  --  6.6  --   ALBUMIN 2.6* 2.7* 2.5* 2.6*  Unclear etiology of this.  He has no GI symptoms.  Continue monitoring

## 2021-09-10 NOTE — Progress Notes (Signed)
PROGRESS NOTE  Sean [name]:[id number] DOB: [birth year]   PCP: [name], MD  Patient is from: Home.  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  CTA chest on 5/23 negative for PE but acute pneumomediastinum with extension into the lower neck and bilateral lower lobe volume loss/possible consolidation concerning for pneumonia or rheumatoid lung disease.  CT chest with oral contrast on 5/23 confirmed pneumomediastinum and multiple focal and confluent infiltrates scattered throughout the lungs but no contrast extravasation. patient was started on broad-spectrum antibiotics with cefepime and Zithromax.  Pulmonology, GI and ENT consulted.  CT cervical spine without contrast.  CT cervical spine without contrast on 5/26 concerning for gas in the prevertebral space, carotid space, deep cervical space on the right likely an extension from known pneumomediastinum.  Patient had fluoroscopy guided Dobbhoff tube placed on 5/27.  CT soft tissue neck with contrast on 5/29 showed regression of upper pneumomediastinum and soft tissue emphysema but organized retropharyngeal gas or fluid concerning for developing abscess.  Imaging reviewed by me ENT who did not feel there is significant abscess for surgical drain.  NG tube removed on 5/31.  He had a barium swallow study concerning for posterior pharyngeal perforation.  ENT recommended transfer to tertiary center for repair.  Called [facility], and talked to Dr. [name] and ENT on call.   After he reviewed patient's imaging, he recommended strict n.p.o., good oral hygiene with chlorhexidine mouth rinse, good antibiotic coverage and G-tube for feeding to allow the posterior pharyngeal perforation heal on its own. He feels stitching or trying to repair the perforation would cause more tissue damage.  Dr. [name] did not feel transfer to [name] is necessary.  However, he suggested calling back if patient is worse.  IR consulted for G-tube placement.     Subjective: Seen and examined earlier this morning.  No major events overnight of this morning.  No complaints other than some soreness in his right neck.  Denies shortness of breath  Objective: Vitals:   09/10/21 0437 09/10/21 0458 09/10/21 0814 09/10/21 1143  BP: 102/67  111/73 111/76  Pulse: 90  90 90  Resp: 16  18 18   Temp: 98.4 F (36.9 C)  98 F (36.7 C) 99 F (37.2 C)  TempSrc:   Oral   SpO2: 96%  97% 96%  Weight:  70.7 kg 70.8 kg   Height:        Examination:  GENERAL: No apparent distress.  Nontoxic. HEENT: MMM.  No trismus.  Voice clear.  Vision and hearing grossly intact.  NECK: Supple.  FROM.  Right anterior cervical LAD RESP:  No IWOB.  Fair aeration bilaterally. CVS:  RRR. Heart sounds normal.  ABD/GI/GU: BS+. Abd soft, NTND.  MSK/EXT:  Moves extremities. No apparent deformity. No edema.  SKIN: no apparent skin lesion or wound NEURO: Awake and alert. Oriented appropriately.  No apparent focal neuro deficit. PSYCH: Calm. Normal affect.   Procedures:  5/25-EGD  Microbiology summarized: MRSA PCR screen nonreactive.  Assessment and Plan: * Multifocal pneumonia CT chest concerning for multifocal pneumonia.  He also have history of rheumatoid arthritis with  pulmonary involvement. He is on multiple immunosuppressants for rheumatoid arthritis.  He is also at risk for aspiration pneumonia due to dysphagia, dysphagia and recent candidal infection.  Surprisingly, he has not had any fever.  Tmax was 100.1 on  arrival.  He has no leukocytosis either.  He is on room air. -Vanco, cefepime and Zithromax 5/23-5/27>> Levaquin 5/30-5/31. -On IV Unasyn mainly for posterior pharyngeal perforation  Concern for retropharyngeal abscess/posterior pharyngeal perforation Initially CT soft tissue neck on 5/29 raises concern for developing retropharyngeal abscess.  Imaging reviewed by ENT, who didn't appreciate an abscess that warrants this surgical intervention.  Barium swallow study on 5/31 raises concern for posterior pharyngeal perforation per radiology and SLP.  ENT recommends transfer to tertiary center for repair.  EGD on 5/25 normal except for proximal esophageal stricture.  Esophageal biopsy negative for CMV or malignancy. -Appreciate help by SLP, pulmonology, ENT and GI. -On-call ENT at the [facility], Dr. [name] recommended strict n.p.o., antibiotics, good oral hygiene and G-tube feeding.  Transfer not necessary unless patient decompensates -Change antibiotic to IV Unasyn. -Added chlorhexidine mouth rinse -IR consulted for G-tube.  Rheumatoid arthritis with pulmonary involvement On methotrexate, prednisone, CellCept and Plaquenil at home. -On IV Solu-Medrol per pulmonology for now pending G-tube  Elevated liver enzymes Recent Labs  Lab 09/10/21 0532  AST 68*  ALT 85*  ALKPHOS 45  BILITOT 0.5  PROT 6.6  ALBUMIN 2.6*  Unclear etiology of this.  He has no GI symptoms.  Continue monitoring  Dysphagia/odynophagia Likely due to the above.  Severe protein calorie malnutrition/unintentional weight loss Body mass index is 18.43 kg/m. Nutrition Status: Nutrition Problem: Severe Malnutrition Etiology: chronic illness (RA, rheumatoid lung disease) Signs/Symptoms: severe fat depletion, mild muscle depletion, moderate muscle depletion, percent weight loss Percent weight loss: 19 % Interventions: Prostat, Tube feeding  Steroid-induced hyperglycemia No history of diabetes. Recent Labs  Lab  09/09/21 2019 09/10/21 0033 09/10/21 0440 09/10/21 0819 09/10/21 1145  GLUCAP 79 183* 93 122* 205*  Continue sliding scale insulin   Hypomagnesemia Mg 1.6.  IV magnesium sulfate 2 g x 1      DVT prophylaxis:  enoxaparin (LOVENOX) injection 40 mg Start: 09/01/21 2200  Code Status: Full code Family Communication: None at bedside Level of care: Progressive.  Change level of care to MedSurg Status is: Inpatient Remains inpatient appropriate because: Posterior pharyngeal perforation/possible retropharyngeal abscess   Final disposition: Likely home once medically cleared Consultants:  Pulmonology ENT in house and at [facility] Gastroenterology Interventional radiology  Sch Meds:  Scheduled Meds:  Adalimumab  40 mg Subcutaneous Q14 Days   chlorhexidine  15 mL Mouth/Throat BID   diclofenac Sodium  2 g Topical QID   enoxaparin (LOVENOX) injection  40 mg Subcutaneous Q24H   feeding supplement (PROSource TF)  45 mL Per Tube BID   fluticasone furoate-vilanterol  1 puff Inhalation Daily   And   umeclidinium bromide  1 puff Inhalation Daily   insulin aspart  0-9 Units Subcutaneous TID WC   methylPREDNISolone (SOLU-MEDROL) injection  40 mg Intravenous Q24H   pantoprazole (PROTONIX) IV  40 mg Intravenous Q24H   pneumococcal 20-valent conjugate vaccine  0.5 mL Intramuscular Tomorrow-1000   Vitamin D (Ergocalciferol)  50,000 Units Oral Weekly   Continuous Infusions:  sodium chloride Stopped (09/04/21 1956)   0.9 % NaCl with KCl 20 mEq / L 100 mL/hr at 09/10/21 0538   ampicillin-sulbactam (UNASYN) IV 3 g (09/10/21 0457)   PRN Meds:.sodium chloride, acetaminophen **OR** acetaminophen, HYDROmorphone (DILAUDID) injection,  ipratropium-albuterol, magic mouthwash w/lidocaine, ondansetron **OR** ondansetron (ZOFRAN) IV  Antimicrobials: Anti-infectives (From admission, onward)    Start     Dose/Rate Route Frequency Ordered Stop   09/09/21 1800  Ampicillin-Sulbactam (UNASYN) 3 g in sodium  chloride 0.9 % 100 mL IVPB        3 g 200 mL/hr over 30 Minutes Intravenous Every 6 hours 09/09/21 1530     09/08/21 1000  levofloxacin (LEVAQUIN) tablet 750 mg  Status:  Discontinued        750 mg Per Tube Daily 09/08/21 0752 09/09/21 1530   09/02/21 2000  azithromycin (ZITHROMAX) 500 mg in sodium chloride 0.9 % 250 mL IVPB        500 mg 250 mL/hr over 60 Minutes Intravenous Every 24 hours 09/01/21 1929 09/05/21 2034   09/02/21 1330  micafungin (MYCAMINE) 150 mg in sodium chloride 0.9 % 100 mL IVPB  Status:  Discontinued        150 mg 107.5 mL/hr over 1 Hours Intravenous Every 24 hours 09/02/21 1215 09/04/21 1011   09/02/21 0900  sulfamethoxazole-trimethoprim (BACTRIM) 400-80 MG per tablet 1 tablet  Status:  Discontinued        1 tablet Oral Once per day on Mon Wed Fri 09/01/21 1958 09/03/21 0947   09/02/21 0701  ceFEPIme (MAXIPIME) 2 g in sodium chloride 0.9 % 100 mL IVPB  Status:  Discontinued        2 g 200 mL/hr over 30 Minutes Intravenous Every 12 hours 09/01/21 1933 09/01/21 2033   09/02/21 0700  vancomycin (VANCOREADY) IVPB 1250 mg/250 mL  Status:  Discontinued        1,250 mg 166.7 mL/hr over 90 Minutes Intravenous Every 12 hours 09/01/21 2034 09/02/21 1054   09/02/21 0400  ceFEPIme (MAXIPIME) 2 g in sodium chloride 0.9 % 100 mL IVPB        2 g 200 mL/hr over 30 Minutes Intravenous Every 8 hours 09/01/21 2033 09/06/21 2225   09/01/21 2200  hydroxychloroquine (PLAQUENIL) tablet 200 mg  Status:  Discontinued        200 mg Oral 2 times daily 09/01/21 1958 09/02/21 1241   09/01/21 1930  vancomycin (VANCOREADY) IVPB 1500 mg/300 mL        1,500 mg 150 mL/hr over 120 Minutes Intravenous  Once 09/01/21 1859 09/01/21 2206   09/01/21 1930  cefTRIAXone (ROCEPHIN) 2 g in sodium chloride 0.9 % 100 mL IVPB  Status:  Discontinued        2 g 200 mL/hr over 30 Minutes Intravenous Every 24 hours 09/01/21 1929 09/01/21 1929   09/01/21 1915  azithromycin (ZITHROMAX) 500 mg in sodium chloride 0.9 %  250 mL IVPB        500 mg 250 mL/hr over 60 Minutes Intravenous  Once 09/01/21 1909 09/01/21 2118   09/01/21 1900  ceFEPIme (MAXIPIME) 2 g in sodium chloride 0.9 % 100 mL IVPB        2 g 200 mL/hr over 30 Minutes Intravenous  Once 09/01/21 1859 09/01/21 1945        I have personally reviewed the following labs and images: CBC: Recent Labs  Lab 09/08/21 0513 09/09/21 0545 09/10/21 0532  WBC 4.8 4.3 5.6  NEUTROABS  --   --  3.4  HGB 12.3* 12.1* 12.0*  HCT 37.3* 35.4* 36.0*  MCV 91.9 91.2 92.8  PLT 272 283 283   BMP &GFR Recent Labs  Lab 09/05/21 1145 09/05/21 1623 09/06/21 0351 09/06/21 1704 09/07/21 0437 09/08/21  [id number] 09/09/21 0545 09/10/21 0532  NA  --   --   --   --  134* 136 131* 137  K 4.7  --  4.2  --  4.0 4.5 3.9 4.8  CL  --   --   --   --  103 102 99 105  CO2  --   --   --   --  25 27 26 28   GLUCOSE  --   --   --   --  227* 215* 292* 104*  BUN  --   --   --   --  19 18 18 15   CREATININE  --   --   --   --  0.63 0.70 0.57* 0.79  CALCIUM  --   --   --   --  8.8* 8.9 8.6* 8.8*  MG 1.9 2.0 1.9 1.9  --   --   --  1.6*  PHOS 2.6 3.5 3.2 3.0  --   --   --  4.1   Estimated Creatinine Clearance: 118 mL/min (by C-G formula based on SCr of 0.79 mg/dL). Liver & Pancreas: Recent Labs  Lab 09/10/21 0532  AST 68*  ALT 85*  ALKPHOS 45  BILITOT 0.5  PROT 6.6  ALBUMIN 2.6*   No results for input(s): LIPASE, AMYLASE in the last 168 hours. No results for input(s): AMMONIA in the last 168 hours. Diabetic: Recent Labs    09/10/21 0532  HGBA1C 11.9*   Recent Labs  Lab 09/09/21 2019 09/10/21 0033 09/10/21 0440 09/10/21 0819 09/10/21 1145  GLUCAP 79 183* 93 122* 205*   Cardiac Enzymes: No results for input(s): CKTOTAL, CKMB, CKMBINDEX, TROPONINI in the last 168 hours. No results for input(s): PROBNP in the last 8760 hours. Coagulation Profile: No results for input(s): INR, PROTIME in the last 168 hours. Thyroid Function Tests: No results for input(s):  TSH, T4TOTAL, FREET4, T3FREE, THYROIDAB in the last 72 hours. Lipid Profile: No results for input(s): CHOL, HDL, LDLCALC, TRIG, CHOLHDL, LDLDIRECT in the last 72 hours. Anemia Panel: No results for input(s): VITAMINB12, FOLATE, FERRITIN, TIBC, IRON, RETICCTPCT in the last 72 hours. Urine analysis: No results found for: COLORURINE, APPEARANCEUR, LABSPEC, PHURINE, GLUCOSEU, HGBUR, BILIRUBINUR, KETONESUR, PROTEINUR, UROBILINOGEN, NITRITE, LEUKOCYTESUR Sepsis Labs: Invalid input(s): PROCALCITONIN, LACTICIDVEN  Microbiology: Recent Results (from the past 240 hour(s))  Surgical pcr screen     Status: None   Collection Time: 09/01/21 10:03 PM   Specimen: Nasal Mucosa; Nasal Swab  Result Value Ref Range Status   MRSA, PCR NEGATIVE NEGATIVE Final   Staphylococcus aureus NEGATIVE NEGATIVE Final    Comment: (NOTE) The Xpert SA Assay (FDA approved for NASAL specimens in patients 44 years of age and older), is one component of a comprehensive surveillance program. It is not intended to diagnose infection nor to guide or monitor treatment. Performed at [facility], [address] [place], [place] [id number]     Radiology Studies: CT ABDOMEN WO CONTRAST  Result Date: 09/10/2021 CLINICAL DATA:  Posterior pharyngeal perforation. Patient in need of percutaneous gastrostomy tube. Assess anatomy. EXAM: CT ABDOMEN WITHOUT CONTRAST TECHNIQUE: Multidetector CT imaging of the abdomen was performed following the standard protocol without IV contrast. RADIATION DOSE REDUCTION: This exam was performed according to the departmental dose-optimization program which includes automated exposure control, adjustment of the mA and/or kV according to patient size and/or use of iterative reconstruction technique. COMPARISON:  Recent PET-CT 08/20/2021 FINDINGS: Lower chest: Advanced fibrotic changes. The heart is within  normal limits for size. No acute abnormality. Hepatobiliary: No focal liver abnormality is  seen. No gallstones, gallbladder wall thickening, or biliary dilatation. Pancreas: Unremarkable. No pancreatic ductal dilatation or surrounding inflammatory changes. Spleen: Normal in size without focal abnormality. Adrenals/Urinary Tract: Adrenal glands are unremarkable. Kidneys are normal, without renal calculi, focal lesion, or hydronephrosis. Stomach/Bowel: Oral contrast material present throughout the colon. Low lying stomach. The gastric antrum is just above the umbilicus. No evidence of obstruction. Vascular/Lymphatic: Limited evaluation in the absence of intravenous contrast. No aneurysm or significant atherosclerotic plaque. Other: No abdominal wall hernia or abnormality. Musculoskeletal: No acute or significant osseous findings. IMPRESSION: Low-lying stomach, the gastric antrum is just above the umbilicus. Anatomy would be suitable for percutaneous gastrostomy tube placement. Electronically Signed   By: Malachy Moan M.D.   On: 09/10/2021 09:12      Syrah Daughtrey T. Michail Boyte [facility]  If 7PM-7AM, please contact night-coverage [email or web address] 09/10/2021, 1:12 PM

## 2021-09-10 NOTE — Progress Notes (Signed)
Nutrition Follow-up  DOCUMENTATION CODES:   Severe malnutrition in context of chronic illness  INTERVENTION:   -Once feeding access is obtained, recommend:  1 carton (237 ml) Osmolite 1.5 via g-tube 8 times daily  5 ml free water flush before and after each feeding administration  Tube feeding regimen provides 2840 kcal (100% of needs), 119 grams of protein, and 1448 ml of H2O. 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  Ongoing  GOAL:   Patient will meet greater than or equal to 90% of their needs  Progressing  MONITOR:   Diet advancement, TF tolerance  REASON FOR ASSESSMENT:   Malnutrition Screening Tool    ASSESSMENT:   Pt admitted with chest pain d/t bilateral PNA. 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  5/25 EGD: stricture of upper esophageal opening, biopsy pending 5/26 SLP recommended NPO due to severe aspiration 5/27 Radiology consulted for NG tube (tip of tube confirmed in gastric antrum), TF initiated  Reviewed I/O's: +312 ml x 24 hours and +8.3 L since admission   Case discussed with SLP; NGT removed on 09/09/21 secondary to repeat MBSS. Plan to continue NPO. Pt with esophageal perforation and will require g-tube placement, which will occur with IR tomorrow.   Medications reviewed and include solu-medrol.   Labs reviewed: CBGS: 93-205 (inpatient orders for glycemic control are 0-9 units insulin aspart TID with meals).    Diet Order:   Diet Order             Diet NPO time specified  Diet effective now                   EDUCATION NEEDS:   Education needs have been addressed  Skin:  Skin Assessment: Reviewed RN Assessment  Last BM:  09/06/21 (type 6)  Height:   Ht Readings from  Last 1 Encounters:  09/03/21 6\' 5"  (1.956 m)    Weight:   Wt Readings from Last 1 Encounters:  09/10/21 70.8 kg   BMI:  Body mass index is 18.51 kg/m.  Estimated Nutritional Needs:   Kcal:  [id number]  Protein:  115-130 grams  Fluid:  > 2 L    Levada SchillingJenifer W, RD, LDN, CDCES Registered Dietitian II Certified Diabetes Care and Education Specialist Please refer to [facility] for RD and/or RD on-call/weekend/after hours pager

## 2021-09-10 NOTE — Assessment & Plan Note (Signed)
Mg 1.8.  IV magnesium sulfate 2 g x 1

## 2021-09-10 NOTE — Consult Note (Signed)
Chief Complaint: Patient was seen in consultation today for  Chief Complaint  Patient presents with   Chest Pain    Referring Physician(s): Dr. Cyndia Skeeters   Supervising Physician: Juliet Rude  Patient Status: [facility] - In-pt  History of Present Illness: Sean [name] is a 46 y.o. 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 He was recently treated for oral thrush. He presented to the ED 09/01/21 with acute onset dyspnea with difficulty swallowing, low grade fevers and a productive cough. Imaging showed acute pneumomediastinum and findings concerning for multifocal pneumonia. Additional work up included an EGD which showed a proximal stricture and a barium swallow evaluation showed esophageal dysphagia. An NG tube was placed 09/05/21 for temporary feeding.   The patient was evaluated further by Speech Therapy with findings of a possible distal esophageal perforation. Speech therapy note from 5/31 states:   "However, post assessment of the MBSS by ENT, there is now concern of perforation of the proximal Esophageal (posterior) wall resulting in contrast moving into the retropharyngeal space.   Pt is recommended to be NPO w/ oral care and hygiene; discussion of alternative means of feeding (PEG). "  Interventional Radiology has been asked to evaluate this patient for an image-guided gastrostomy tube placement. Imaging reviewed and procedure approved by Dr. Laurence Ferrari.   Past Medical History:  Diagnosis Date   H/O immunosuppressive therapy    Hyperpigmentation of skin    Hyperpigmentation rash/vesicles of the palm and face   Neutropenia ([place])    RA (rheumatoid arthritis) ([place])     Past Surgical History:  Procedure Laterality Date   ESOPHAGOGASTRODUODENOSCOPY N/A 09/03/2021   Procedure: ESOPHAGOGASTRODUODENOSCOPY (EGD);  Surgeon: Lesly Rubenstein, MD;  Location: [facility] ENDOSCOPY;  Service: Endoscopy;   Laterality: N/A;    Allergies: Patient has no known allergies.  Medications: Prior to Admission medications   Medication Sig Start Date End Date Taking? Authorizing Provider  BREZTRI AEROSPHERE 160-9-4.8 MCG/ACT AERO Inhale 2 puffs into the lungs 2 (two) times daily. 08/27/21  Yes [provider]  hydroxychloroquine (PLAQUENIL) 200 MG tablet Take 1 tablet by mouth 2 (two) times daily. 03/26/21  Yes [provider]  mycophenolate (CELLCEPT) 500 MG tablet Take 1 tablet by mouth every 12 (twelve) hours. 08/27/21  Yes [provider]  predniSONE (DELTASONE) 10 MG tablet 6 tabs (60 mg)po daily for one week; then taper 5mg  every week until down to 10 mg daily 07/27/21  Yes Wieting, Richard, MD  sulfamethoxazole-trimethoprim (BACTRIM) 400-80 MG tablet Take 1 tablet by mouth 3 (three) times a week.   Yes [provider]  folic acid (FOLVITE) 1 MG tablet Take 1 mg by mouth daily. Patient not taking: Reported on 09/01/2021 07/09/21   [provider]  methotrexate 2.5 MG tablet Take 5 tablets by mouth every 7 (seven) days. Patient not taking: Reported on 09/01/2021 07/09/21   [provider]  Vitamin D, Ergocalciferol, (DRISDOL) 1.25 MG (50000 UNIT) CAPS capsule Take 50,000 Units by mouth once a week. 07/06/21   [provider]  witch hazel-glycerin (TUCKS) pad Apply topically as needed for hemorrhoids. Patient not taking: Reported on 09/01/2021 07/27/21   Loletha Grayer, MD     Family History  Problem Relation Age of Onset   Hyperlipidemia Mother    Hypertension Father     Social History   Socioeconomic History   Marital status: Single    Spouse name: Not on file   Number of  children: Not on file   Years of education: Not on file   Highest education level: Not on file  Occupational History   Not on file  Tobacco Use   Smoking status: Former    Types: Cigarettes   Smokeless tobacco: Never  Vaping Use   Vaping Use: Never used   Substance and Sexual Activity   Alcohol use: Not Currently   Drug use: Not Currently    Types: Marijuana   Sexual activity: Not on file  Other Topics Concern   Not on file  Social History Narrative   Not on file   Social Determinants of Health   Financial Resource Strain: Not on file  Food Insecurity: Not on file  Transportation Needs: Not on file  Physical Activity: Not on file  Stress: Not on file  Social Connections: Not on file    Review of Systems: A 12 point ROS discussed and pertinent positives are indicated in the HPI above.  All other systems are negative.  Review of Systems  Constitutional:  Positive for appetite change. Negative for fatigue.  HENT:  Positive for sore throat and trouble swallowing.   Respiratory:  Negative for cough and shortness of breath.   Cardiovascular:  Negative for chest pain and leg swelling.  Gastrointestinal:  Negative for abdominal pain, diarrhea, nausea and vomiting.  Neurological:  Positive for headaches.   Vital Signs: BP 111/73 (BP Location: Right Arm)   Pulse 90   Temp 98 F (36.7 C) (Oral)   Resp 18   Ht 6\' 5"  (1.956 m)   Wt 156 lb 1.6 oz (70.8 kg)   SpO2 97%   BMI 18.51 kg/m   Physical Exam Constitutional:      General: He is not in acute distress.    Appearance: He is not ill-appearing.  HENT:     Mouth/Throat:     Mouth: Mucous membranes are moist.     Pharynx: Oropharynx is clear.  Cardiovascular:     Rate and Rhythm: Normal rate and regular rhythm.  Pulmonary:     Effort: Pulmonary effort is normal.  Abdominal:     Tenderness: There is no abdominal tenderness.  Neurological:     Mental Status: He is alert and oriented to person, place, and time.  Psychiatric:        Mood and Affect: Mood normal.        Behavior: Behavior normal.        Thought Content: Thought content normal.        Judgment: Judgment normal.    Imaging: CT ABDOMEN WO CONTRAST  Result Date: 09/10/2021 CLINICAL DATA:  Posterior  pharyngeal perforation. Patient in need of percutaneous gastrostomy tube. Assess anatomy. EXAM: CT ABDOMEN WITHOUT CONTRAST TECHNIQUE: Multidetector CT imaging of the abdomen was performed following the standard protocol without IV contrast. RADIATION DOSE REDUCTION: This exam was performed according to the departmental dose-optimization program which includes automated exposure control, adjustment of the mA and/or kV according to patient size and/or use of iterative reconstruction technique. COMPARISON:  Recent PET-CT 08/20/2021 FINDINGS: Lower chest: Advanced fibrotic changes. The heart is within normal limits for size. No acute abnormality. Hepatobiliary: No focal liver abnormality is seen. No gallstones, gallbladder wall thickening, or biliary dilatation. Pancreas: Unremarkable. No pancreatic ductal dilatation or surrounding inflammatory changes. Spleen: Normal in size without focal abnormality. Adrenals/Urinary Tract: Adrenal glands are unremarkable. Kidneys are normal, without renal calculi, focal lesion, or hydronephrosis. Stomach/Bowel: Oral contrast material present throughout the colon. Low lying stomach.  The gastric antrum is just above the umbilicus. No evidence of obstruction. Vascular/Lymphatic: Limited evaluation in the absence of intravenous contrast. No aneurysm or significant atherosclerotic plaque. Other: No abdominal wall hernia or abnormality. Musculoskeletal: No acute or significant osseous findings. IMPRESSION: Low-lying stomach, the gastric antrum is just above the umbilicus. Anatomy would be suitable for percutaneous gastrostomy tube placement. Electronically Signed   By: Jacqulynn Cadet M.D.   On: 09/10/2021 09:12   DG Chest 2 View  Result Date: 09/01/2021 CLINICAL DATA:  Chest pain. EXAM: CHEST - 2 VIEW COMPARISON:  July 26, 2021.  Aug 20, 2021. FINDINGS: The heart size and mediastinal contours are within normal limits. Stable bibasilar atelectasis or infiltrates are noted with  associated pleural effusions. The visualized skeletal structures are unremarkable. IMPRESSION: Stable bibasilar atelectasis or infiltrates are noted with associated pleural effusions. Electronically Signed   By: Marijo Conception M.D.   On: 09/01/2021 12:59   CT SOFT TISSUE NECK W CONTRAST  Result Date: 09/07/2021 CLINICAL DATA:  Soft tissue swelling with infection suspected. Severe dysplasia with cough. Recent treatment for oral thrush EXAM: CT NECK WITH CONTRAST TECHNIQUE: Multidetector CT imaging of the neck was performed using the standard protocol following the bolus administration of intravenous contrast. RADIATION DOSE REDUCTION: This exam was performed according to the departmental dose-optimization program which includes automated exposure control, adjustment of the mA and/or kV according to patient size and/or use of iterative reconstruction technique. CONTRAST:  66mL OMNIPAQUE IOHEXOL 300 MG/ML  SOLN COMPARISON:  Cervical spine CT from 3 days ago FINDINGS: Pharynx and larynx: Submucosal low-density thickening along the posterior hypopharynx and possibly at the aryepiglottic folds. There has been persistence of gas in the retropharyngeal space with clustered bubbles and fluid that shows some signs of peripheral ring is a shin with enhancing rim. The collection measures up to 8 mm anterior to posterior and 4.6 cm in transverse span. The collection is seen from C1-T2 C5-6. Gas elsewhere in the neck has resolved. Salivary glands: No inflammation, mass, or stone. Thyroid: Normal. Lymph nodes: None enlarged or abnormal density. Vascular: No acute finding. Limited intracranial: Negative Visualized orbits: Negative Mastoids and visualized paranasal sinuses: Clear Skeleton: No primary spinal infection seen. Upper chest: Patchy nodular opacities in the upper lungs as seen by recent chest CT. IMPRESSION: Upper pneumomediastinum and soft tissue emphysema has regressed but retropharyngeal gas and fluid has  organized, likely developing abscess. Electronically Signed   By: Jorje Guild M.D.   On: 09/07/2021 10:07   CT CHEST WO CONTRAST  Result Date: 09/01/2021 CLINICAL DATA:  Esophageal perforation with pneumo mediastinum. Right-sided chest pain for a few days. EXAM: CT CHEST WITHOUT CONTRAST TECHNIQUE: Multidetector CT imaging of the chest was performed following the standard protocol without IV contrast. RADIATION DOSE REDUCTION: This exam was performed according to the departmental dose-optimization program which includes automated exposure control, adjustment of the mA and/or kV according to patient size and/or use of iterative reconstruction technique. COMPARISON:  CT angiography of the chest 09/01/2021 FINDINGS: Cardiovascular: Mild cardiac enlargement. No pericardial effusions. Normal caliber thoracic aorta. Mediastinum/Nodes: Extensive pneumomediastinum is again demonstrated throughout the mediastinum and extending into the base of the neck and supraclavicular regions. Contrast material fills the esophagus. The esophagus is patent to the stomach without any definite contrast extravasation. No significant lymphadenopathy. Lungs/Pleura: Emphysematous changes in the lungs. Coarse multifocal and confluent infiltrates scattered throughout the lungs with consolidation in the lung bases. This may represent multifocal pneumonia. Small bilateral pleural effusions. Airways  are patent. Upper Abdomen: No acute abnormalities demonstrated. Musculoskeletal: No chest wall mass or suspicious bone lesions identified. IMPRESSION: 1. Extensive pneumomediastinum as previously demonstrated. Contrast filled esophagus is patent and no extraluminal contrast extravasation is identified. 2. Multiple focal and confluent infiltrates are scattered throughout the lungs, suggesting multifocal pneumonia. Small bilateral pleural effusions. Electronically Signed   By: Lucienne Capers M.D.   On: 09/01/2021 18:31   CT Angio Chest PE W  and/or Wo Contrast  Result Date: 09/01/2021 CLINICAL DATA:  Pulmonary embolism suspected. High probability. Right-sided chest pain. Rheumatoid arthritis. EXAM: CT ANGIOGRAPHY CHEST WITH CONTRAST TECHNIQUE: Multidetector CT imaging of the chest was performed using the standard protocol during bolus administration of intravenous contrast. Multiplanar CT image reconstructions and MIPs were obtained to evaluate the vascular anatomy. RADIATION DOSE REDUCTION: This exam was performed according to the departmental dose-optimization program which includes automated exposure control, adjustment of the mA and/or kV according to patient size and/or use of iterative reconstruction technique. CONTRAST:  71mL OMNIPAQUE IOHEXOL 350 MG/ML SOLN COMPARISON:  Chest radiography same day. Prior CT angiography 07/25/2021. FINDINGS: Cardiovascular: Heart size upper limits of normal. No coronary artery calcification or aortic atherosclerotic calcification. Pulmonary arterial opacification is good. There are no pulmonary emboli. Mediastinum/Nodes: There is pneumomediastinum, extending into the lower neck. No evidence of mass. Few small mediastinal nodes as seen previously. Lungs/Pleura: Small pleural effusions layering dependently. Bilateral lower lobe atelectasis/pneumonia with areas of cystic change. This could be due to infectious pneumonia or rheumatoid lung disease. There is no pneumothorax/pleural air. Upper Abdomen: Negative Musculoskeletal: No acute spinal finding. Review of the MIP images confirms the above findings. IMPRESSION: No pulmonary emboli. Acute pneumomediastinum, with extension into the lower neck. No pneumothorax/pleural air. Small effusions, less than were seen previously. Bilateral lower lobe volume loss/pneumonia with areas of cystic change. Findings could be due to infectious pneumonia and or rheumatoid lung disease. Electronically Signed   By: Nelson Chimes M.D.   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 EXAM: CT CERVICAL SPINE WITHOUT CONTRAST TECHNIQUE: Multidetector CT imaging of the cervical spine was performed without intravenous contrast. Multiplanar CT image reconstructions were also generated. RADIATION DOSE REDUCTION: This exam was performed according to the departmental dose-optimization program which includes automated exposure control, adjustment of the mA and/or kV according to patient size and/or use of iterative reconstruction technique. COMPARISON:  CT chest without contrast 09/01/2021 FINDINGS: Alignment: No significant listhesis is present. Slight reversal of the normal cervical lordosis noted. Skull base and vertebrae: Craniocervical junction is within normal limits. No acute or healing fractures are present. No focal osseous lesions are present. Soft tissues and spinal canal: Gas is present in the prevertebral space, consistent with known pneumomediastinum. Some gas dissects into the carotid and deep cervical space on the right. No fluid collection present. No acute trauma. Spinal canal is within normal limits. Disc levels:  No significant focal stenosis. Upper chest: The lung apices are clear.  Pneumomediastinum noted. IMPRESSION: 1. Gas in the prevertebral space, carotid space, and deep cervical space on the right. This is consistent gas dissecting upwards from the known pneumomediastinum. 2. No acute or healing fractures. 3. No significant focal stenosis. Electronically Signed   By: San Morelle M.D.   On: 09/04/2021 12:13   NM PET Image Initial (PI) Skull Base To Thigh (F-18 FDG)  Result Date: 08/23/2021 CLINICAL DATA:  Initial treatment strategy for failure to thrive.  Bilateral pulmonary infiltrates. Unexplained weight loss. History of immunosuppressive therapy. EXAM: NUCLEAR MEDICINE PET SKULL BASE TO THIGH TECHNIQUE: 9.05 mCi F-18 FDG was injected intravenously. Full-ring PET imaging  was performed from the skull base to thigh after the radiotracer. CT data was obtained and used for attenuation correction and anatomic localization. Fasting blood glucose: 210 mg/dl COMPARISON:  Chest CT 07/25/2021 FINDINGS: Mediastinal blood pool activity: SUV max 1.51 Liver activity: SUV max NA NECK: No hypermetabolic lymph nodes in the neck. Incidental CT findings: none CHEST: No hypermetabolic mediastinal or hilar nodes. No suspicious pulmonary nodules on the CT scan. Incidental CT findings: Persistent left pleural effusion and bilateral pulmonary infiltrates. Stable prominent mediastinal and hilar lymph nodes likely reactive/hyperplastic. ABDOMEN/PELVIS: No abnormal hypermetabolic activity within the liver, pancreas, adrenal glands, or spleen. No hypermetabolic lymph nodes in the abdomen or pelvis. Incidental CT findings: none SKELETON: No focal hypermetabolic activity to suggest skeletal metastasis. Incidental CT findings: none IMPRESSION: Negative PET-CT for hypermetabolic neoplastic process. Persistent left pleural effusion, bilateral filtrates and reactive mediastinal and hilar adenopathy. Electronically Signed   By: Marijo Sanes M.D.   On: 08/23/2021 17:10   DG Chest Port 1 View  Result Date: 09/06/2021 CLINICAL DATA:  Follow-up pneumomediastinum. Rheumatoid lung disease. Aspiration pneumonia. EXAM: PORTABLE CHEST 1 VIEW COMPARISON:  09/03/2021 and chest CT dated 09/01/2021. FINDINGS: Small residual pneumomediastinum without significant change. Mildly improved bibasilar airspace opacities and left basilar linear densities. Interval mild-to-moderate peribronchial thickening. Stable small bilateral pleural effusions. Feeding tube extending into the stomach. Unremarkable bones. IMPRESSION: 1. Stable small residual pneumomediastinum. 2. Interval mild to moderate bronchitic changes. 3. Mild improved bibasilar pneumonia/aspiration pneumonitis. 4. Stable small bilateral pleural effusions. Electronically  Signed   By: Claudie Revering M.D.   On: 09/06/2021 14:54   DG Chest Port 1 View  Result Date: 09/03/2021 CLINICAL DATA:  Monitoring pneumomediastinum expansion as patient endorses increased pain/pressure EXAM: PORTABLE CHEST 1 VIEW COMPARISON:  Chest radiograph May 23, 23. FINDINGS: Similar bibasilar opacities. Similar pneumomediastinum with gas in the partially visualized lower neck. Similar cardiomediastinal silhouette. Small bilateral pleural effusions. No definite pneumothorax. IMPRESSION: Similar bibasilar opacities, pneumomediastinum, and small bilateral pleural effusions. Electronically Signed   By: Margaretha Sheffield M.D.   On: 09/03/2021 10:29   DG [facility] Dys Tube Plc W/Fl W/Rad  Result Date: 09/05/2021 INDICATION: RA with rheumatoid lung disease, aspiration pneumonia, pneumomediastinum without extravasation of esophageal contrast on CT chest 09/01/21. Request for image guided Dobhoff tube placement due to pharyngeal edema impairing UES function on MBSS yesterday with speech pathology. EXAM: NASO G TUBE PLACEMENT WITH FL AND WITH RAD COMPARISON:  CT chest w/o contrast 09/01/21 CONTRAST:  None FLUOROSCOPY TIME:  Radiation Exposure Index (as provided by the fluoroscopic device): [id number] MGy Kerma COMPLICATIONS: None immediate PROCEDURE: The Dobhoff tube was lubricated with viscous lidocaine inserted into the right nostril. Under intermittent fluoroscopic guidance, the Dobhoff tube was advanced to the stomach with tip terminating in the gastric antrum. A spot fluoroscopic image was saved for documentation purposes. The tube was flushed easily with 10 cc 0.9% normal saline and affixed to the patient's nose with tape at 66 cm mark. The patient tolerated the procedure well without immediate postprocedural complication. IMPRESSION: Successful fluoroscopic guided placement of Dobhoff tube with tip terminating within the gastric antrum. The tube is ready for immediate use. Performed by Candiss Norse, PA-C and  supervised by Kerby Moors, MD Electronically Signed   By: Kerby Moors M.D.   On: 09/05/2021 15:55  Labs:  CBC: Recent Labs    09/02/21 0612 09/08/21 0513 09/09/21 0545 09/10/21 0532  WBC 10.3 4.8 4.3 5.6  HGB 12.3* 12.3* 12.1* 12.0*  HCT 36.3* 37.3* 35.4* 36.0*  PLT 186 272 283 283    COAGS: Recent Labs    07/26/21 1011  INR 1.0    BMP: Recent Labs    09/07/21 0437 09/08/21 0513 09/09/21 0545 09/10/21 0532  NA 134* 136 131* 137  K 4.0 4.5 3.9 4.8  CL 103 102 99 105  CO2 25 27 26 28   GLUCOSE 227* 215* 292* 104*  BUN 19 18 18 15   CALCIUM 8.8* 8.9 8.6* 8.8*  CREATININE 0.63 0.70 0.57* 0.79  GFRNONAA >60 >60 >60 >60    LIVER FUNCTION TESTS: Recent Labs    07/25/21 2110 09/01/21 1227 09/02/21 1318 09/10/21 0532  BILITOT 0.3 0.8 0.6 0.5  AST 99* 33 26 68*  ALT 132* 52* 38 85*  ALKPHOS 56 63 47 45  PROT 6.7 7.1 6.4* 6.6  ALBUMIN 2.8* 3.0* 2.6* 2.6*    TUMOR MARKERS: No results for input(s): AFPTM, CEA, CA199, CHROMGRNA in the last 8760 hours.  Assessment and Plan:  Dysphagia; suspected distal esophageal perforation: Sean [name], 46 year old male, is tentatively scheduled 09/11/21 for an image-guided gastrostomy tube placement.   Risks and benefits image guided gastrostomy tube placement was discussed with the patient including, but not limited to the need for a barium enema during the procedure, bleeding, infection, peritonitis and/or damage to adjacent structures.  All of the patient's questions were answered, patient is agreeable to proceed. He will be NPO at midnight. CBC, PT/INR and procedure antibiotics have been ordered. Last dose of Lovenox was 09/09/21.   Consent signed and in chart.  Thank you for this interesting consult.  I greatly enjoyed meeting Sean [name] and look forward to participating in their care.  A copy of this report was sent to the requesting provider on this date.  Electronically Signed: Soyla Dryer,  AGACNP-BC [phone] 09/10/2021, 10:27 AM   I spent a total of 20 Minutes    in face to face in clinical consultation, greater than 50% of which was counseling/coordinating care for gastrostomy tube placement.

## 2021-09-10 NOTE — Progress Notes (Signed)
PULMONOLOGY         Date: 09/10/2021,   MRN# [id number] Sean [name] [birth year]     AdmissionWeight: 72.6 kg                 CurrentWeight: 70.7 kg  Referring provider: Dr. Dareen Piano   CHIEF COMPLAINT:   Forceful cough with pneumomediastinum   HISTORY OF PRESENT ILLNESS   This is a 46 pleasant patient with a history of severe rheumatoid arthritis, seen previously while hospitalized due to bilateral multifocal pneumonia with treatment for community-acquired pneumonia and resolution.  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  Patient has been progressively dyspneic with reduction in spirometry performed in clinic via PFT.  Additional evaluation with rheumatology status post initiation of Humira infusions as well as CellCept and chronic prednisone use at higher dose starting 60 mg with slow taper.  On previous admission he also had moderate pleural effusion with resolution postthoracentesis and clinical improvement patient develops worsening symptoms when tapering below 45 mg daily including scaling of skin fingers and skin redness and erythema around nasolabial folds and worsening dyspnea and stiffness.  He came in on this admission with chest discomfort and severe dysphagia with cough.  He was seen in clinic and noted to have severe oral thrush status post ear nose and throat evaluation with course of Diflucan and partial resolution.  On examination there is still erythema in the upper airway and lower airway as well as remnants of thrush visible in the lower airway.  CT chest independently reviewed with findings of pneumomediastinum without esophageal perforation.  Overall interval improvement in lung parenchyma bilaterally.  Pulmonary critical care consultation placed for additional evaluation management   09/03/21-  patient seen at bedside. He has continued dysphagia worse  with pill swallowing. I have changed most of his PO meds to IV for now. Discussed medical plan with family and patient at bedside. Reviewed case with GI today there is plan for EGD. Reviewed CXR this am, mild b/l pleural effusions worse from prior but not clinicallyh significant, pneumonmediastinum is stable. Will continue to follow.   09/04/21 - patient seems improved.  He continues to have pain with swallowing. 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 There were biopsies done. We will obtain CT neck to eval cervical spine  09/07/21- patient clinically improved with less neck pain and less dysphagia. He had repeat CT neck with findings of fluid collection possible abscess.  Reviewed with Dr Dareen Piano and sent message to Dr Elenore Rota ENT for evaluation. He remains on cefepime.   09/08/21- Patient is s/p ENT evaluation with no findings to suggest abscess. Ive ordered repeat SLP to allow patient PO nourishement. He remains on levofloxacin and solumedrol for now and is clinically improved.   09/09/21- patient had eventful day. He is s/p SLP with findings of perforated hypopharynx and s/p ENT evaluation. He is to be NPO for now and possibly transfer to tertiary facility that may be able to offer head & neck surgery. He is reporting improvement in dysphagia and neck pain. 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   09/10/21- patient had eventful day. He has PEG tube planned for AM.    PAST MEDICAL HISTORY   Past Medical History:  Diagnosis Date   H/O immunosuppressive therapy    Hyperpigmentation of skin    Hyperpigmentation rash/vesicles of the palm and face  Neutropenia (HCC)    RA (rheumatoid arthritis) (HCC)      SURGICAL HISTORY   Past Surgical History:  Procedure Laterality Date   ESOPHAGOGASTRODUODENOSCOPY N/A 09/03/2021   Procedure: ESOPHAGOGASTRODUODENOSCOPY (EGD);  Surgeon: Regis Bill, MD;  Location: [facility] ENDOSCOPY;  Service: Endoscopy;   Laterality: N/A;     FAMILY HISTORY   Family History  Problem Relation Age of Onset   Hyperlipidemia Mother    Hypertension Father      SOCIAL HISTORY   Social History   Tobacco Use   Smoking status: Former    Types: Cigarettes   Smokeless tobacco: Never  Vaping Use   Vaping Use: Never used  Substance Use Topics   Alcohol use: Not Currently   Drug use: Not Currently    Types: Marijuana     MEDICATIONS    Home Medication:    Current Medication:  Current Facility-Administered Medications:    0.9 %  sodium chloride infusion, , Intravenous, PRN, Leeroy Bock, MD, Stopped at 09/04/21 1956   0.9 % NaCl with KCl 20 mEq/ L  infusion, , Intravenous, Continuous, Gonfa, Taye T, MD, Last Rate: 100 mL/hr at 09/10/21 0538, New Bag at 09/10/21 0538   acetaminophen (TYLENOL) tablet 650 mg, 650 mg, Oral, Q6H PRN, 650 mg at 09/04/21 1000 **OR** acetaminophen (TYLENOL) suppository 650 mg, 650 mg, Rectal, Q6H PRN, Mansy, Jan A, MD   Adalimumab PNKT 40 mg, 40 mg, Subcutaneous, Q14 Days, Jamelle Rushing L, MD, 40 mg at 09/03/21 0912   Ampicillin-Sulbactam (UNASYN) 3 g in sodium chloride 0.9 % 100 mL IVPB, 3 g, Intravenous, Q6H, Gonfa, Taye T, MD, Last Rate: 200 mL/hr at 09/10/21 0457, 3 g at 09/10/21 0457   diclofenac Sodium (VOLTAREN) 1 % topical gel 2 g, 2 g, Topical, QID, Jamelle Rushing L, MD, 2 g at 09/09/21 2300   enoxaparin (LOVENOX) injection 40 mg, 40 mg, Subcutaneous, Q24H, Mansy, Jan A, MD, 40 mg at 09/08/21 2253   feeding supplement (PROSource TF) liquid 45 mL, 45 mL, Per Tube, BID, Leeroy Bock, MD, 45 mL at 09/09/21 0902   fluticasone furoate-vilanterol (BREO ELLIPTA) 200-25 MCG/ACT 1 puff, 1 puff, Inhalation, Daily, 1 puff at 09/09/21 0934 **AND** umeclidinium bromide (INCRUSE ELLIPTA) 62.5 MCG/ACT 1 puff, 1 puff, Inhalation, Daily, Mansy, Jan A, MD, 1 puff at 09/09/21 0910   HYDROmorphone (DILAUDID) injection 0.5 mg, 0.5 mg, Intravenous, Q4H PRN, Leeroy Bock, MD, 0.5 mg at 09/10/21 0416   insulin aspart (novoLOG) injection 0-9 Units, 0-9 Units, Subcutaneous, TID WC, Leeroy Bock, MD, 2 Units at 09/09/21 1750   ipratropium-albuterol (DUONEB) 0.5-2.5 (3) MG/3ML nebulizer solution 3 mL, 3 mL, Nebulization, Q4H PRN, Mansy, Jan A, MD   magic mouthwash w/lidocaine, 5 mL, Oral, TID PRN, Leeroy Bock, MD, 5 mL at 09/09/21 0906   magnesium sulfate IVPB 2 g 50 mL, 2 g, Intravenous, Once, Gonfa, Taye T, MD   methylPREDNISolone sodium succinate (SOLU-MEDROL) 40 mg/mL injection 40 mg, 40 mg, Intravenous, Q24H, Nikkol Pai, MD, 40 mg at 09/09/21 0902   ondansetron (ZOFRAN) tablet 4 mg, 4 mg, Oral, Q6H PRN **OR** ondansetron (ZOFRAN) injection 4 mg, 4 mg, Intravenous, Q6H PRN, Mansy, Jan A, MD   pantoprazole (PROTONIX) injection 40 mg, 40 mg, Intravenous, Q24H, Jacklyn Branan, MD, 40 mg at 09/09/21 0350   pneumococcal 20-valent conjugate vaccine (PREVNAR 20) injection 0.5 mL, 0.5 mL, Intramuscular, Tomorrow-1000, Mansy, Jan A, MD   Vitamin D (Ergocalciferol) (DRISDOL) capsule 50,000 Units, 50,000 Units,  Oral, Weekly, Mansy, Vernetta Honey, MD, 50,000 Units at 09/09/21 1610    ALLERGIES   Patient has no known allergies.     REVIEW OF SYSTEMS    Review of Systems:  Gen:  Denies  fever, sweats, chills weigh loss  HEENT: Denies blurred vision, double vision, ear pain, eye pain, hearing loss, nose bleeds, sore throat Cardiac:  No dizziness, chest pain or heaviness, chest tightness,edema Resp:   reports dyspnea chronically  Gi: Denies swallowing difficulty, stomach pain, nausea or vomiting, diarrhea, constipation, bowel incontinence Gu:  Denies bladder incontinence, burning urine Ext:   Denies Joint pain, stiffness or swelling Skin: Denies  skin rash, easy bruising or bleeding or hives Endoc:  Denies polyuria, polydipsia , polyphagia or weight change Psych:   Denies depression, insomnia or hallucinations   Other:  All other systems  negative   VS: BP 111/73 (BP Location: Right Arm)   Pulse 90   Temp 98 F (36.7 C) (Oral)   Resp 18   Ht  (1.956 m)   Wt 70.7 kg   SpO2 97%   BMI 18.48 kg/m      PHYSICAL EXAM    GENERAL:NAD, no fevers, chills, no weakness no fatigue HEAD: Normocephalic, atraumatic.  EYES: Pupils equal, round, reactive to light. Extraocular muscles intact. No scleral icterus.  MOUTH: Moist mucosal membrane. Dentition intact. No abscess noted.  EAR, NOSE, THROAT: Clear without exudates. No external lesions.  NECK: Supple. No thyromegaly. No nodules. No JVD.  PULMONARY: decreased breath sounds with mild rhonchi worse at bases bilaterally.  CARDIOVASCULAR: S1 and S2. Regular rate and rhythm. No murmurs, rubs, or gallops. No edema. Pedal pulses 2+ bilaterally.  GASTROINTESTINAL: Soft, nontender, nondistended. No masses. Positive bowel sounds. No hepatosplenomegaly.  MUSCULOSKELETAL: No swelling, clubbing, or edema. Range of motion full in all extremities.  NEUROLOGIC: Cranial nerves II through XII are intact. No gross focal neurological deficits. Sensation intact. Reflexes intact.  SKIN: No ulceration, lesions, rashes, or cyanosis. Skin warm and dry. Turgor intact.  PSYCHIATRIC: Mood, affect within normal limits. The patient is awake, alert and oriented x 3. Insight, judgment intact.       IMAGING        ASSESSMENT/PLAN   Subacute chest discomfort and severe dysphagia Secondary to forceful cough likely due to recurrent candidiasis with thrush -Failure of Diflucan p.o. and outpatient status post ENT evaluation -s/p micafungin IV for now -Supportive care for pneumomediastinum including reduction of shearing forces, avoid incentive spirometry, BiPAP/CPAP, flutter valve -Agree with empiric cefepime and Zithromax narrowed to levofloxacin -GI consultation-s/p EGD -continue nasal canula O2 for supportive care to allow diffusion of nitrogen rich gas out of soft tissue   Severe  dysphagia   - s/p EGD - severe narrowing of distal esophagus s/p biopsies  - CT cervical spine today -speech and swallow evaluation- nPO -repeat barium SLP - perforated hypopharynx -for PEG tube  Recurrent bilateral pleural effusion Previous thoracentesis with fluid pattern showing mildly ascitic eosinophilic predominant exudate consistent with rheumatoid associated effusions -Have DC'd IVF due to likely contributing to redevelopment of pleural effusion -Clinically patient is not dehydrated   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     Thank you for allowing me to participate in the care of this patient.  Patient/Family are satisfied with care plan and all questions have been answered.    Provider disclosure: Patient with at least one acute or chronic illness or injury that poses a threat to life or bodily function and is being managed actively during this encounter.  All of the below services have been performed independently by signing provider:  review of prior documentation from internal and or external health records.  Review of previous and current lab results.  Interview and comprehensive assessment during patient visit today. Review of current and previous chest radiographs/CT scans. Discussion of management and test interpretation with health care team and patient/family.   This document was prepared using Dragon voice recognition software and may include unintentional dictation errors.     Ottie Glazier, M.D.  Division of Pulmonary & Critical Care Medicine

## 2021-09-10 NOTE — Plan of Care (Signed)

## 2021-09-11 ENCOUNTER — Inpatient Hospital Stay: Payer: 59

## 2021-09-11 ENCOUNTER — Inpatient Hospital Stay: Payer: 59 | Admitting: Radiology

## 2021-09-11 DIAGNOSIS — E1165 Type 2 diabetes mellitus with hyperglycemia: Secondary | ICD-10-CM

## 2021-09-11 HISTORY — PX: IR GASTROSTOMY TUBE MOD SED: IMG625

## 2021-09-11 LAB — RENAL FUNCTION PANEL
Albumin: 2.7 g/dL — ABNORMAL LOW (ref 3.5–5.0)
Anion gap: 3 — ABNORMAL LOW (ref 5–15)
BUN: 12 mg/dL (ref 6–20)
CO2: 28 mmol/L (ref 22–32)
Calcium: 8.7 mg/dL — ABNORMAL LOW (ref 8.9–10.3)
Chloride: 105 mmol/L (ref 98–111)
Creatinine, Ser: 0.68 mg/dL (ref 0.61–1.24)
GFR, Estimated: >60 mL/min (ref >60)
Glucose, Bld: 108 mg/dL — ABNORMAL HIGH (ref 70–99)
Phosphorus: 3.4 mg/dL (ref 2.5–4.6)
Potassium: 4.2 mmol/L (ref 3.5–5.1)
Sodium: 136 mmol/L (ref 135–145)

## 2021-09-11 LAB — GLUCOSE, CAPILLARY
Glucose-Capillary: 128 mg/dL — ABNORMAL HIGH (ref 70–99)
Glucose-Capillary: 162 mg/dL — ABNORMAL HIGH (ref 70–99)
Glucose-Capillary: 287 mg/dL — ABNORMAL HIGH (ref 70–99)
Glucose-Capillary: 80 mg/dL (ref 70–99)

## 2021-09-11 LAB — CBC
HCT: 36.5 % — ABNORMAL LOW (ref 39.0–52.0)
Hemoglobin: 12.1 g/dL — ABNORMAL LOW (ref 13.0–17.0)
MCH: 30.3 pg (ref 26.0–34.0)
MCHC: 33.2 g/dL (ref 30.0–36.0)
MCV: 91.5 fL (ref 80.0–100.0)
Platelets: 314 10*3/uL (ref 150–400)
RBC: 3.99 MIL/uL — ABNORMAL LOW (ref 4.22–5.81)
RDW: 12.6 % (ref 11.5–15.5)
WBC: 5.1 10*3/uL (ref 4.0–10.5)
nRBC: 0 % (ref 0.0–0.2)

## 2021-09-11 LAB — PROTIME-INR
INR: 1 (ref 0.8–1.2)
Prothrombin Time: 12.8 seconds (ref 11.4–15.2)

## 2021-09-11 LAB — MAGNESIUM: Magnesium: 1.8 mg/dL (ref 1.7–2.4)

## 2021-09-11 MED ORDER — MIDAZOLAM HCL 2 MG/2ML IJ SOLN
INTRAMUSCULAR | Status: AC
  Filled 2021-09-11: qty 2

## 2021-09-11 MED ORDER — MIDAZOLAM HCL 5 MG/5ML IJ SOLN
INTRAMUSCULAR | Status: AC | PRN
  Administered 2021-09-11: 1 mg via INTRAVENOUS

## 2021-09-11 MED ORDER — FENTANYL CITRATE (PF) 100 MCG/2ML IJ SOLN
INTRAMUSCULAR | Status: AC
  Filled 2021-09-11: qty 2

## 2021-09-11 MED ORDER — MIDAZOLAM HCL 2 MG/2ML IJ SOLN
INTRAMUSCULAR | Status: AC | PRN
  Administered 2021-09-11: 1 mg via INTRAVENOUS

## 2021-09-11 MED ORDER — LIDOCAINE HCL 1 % IJ SOLN
INTRAMUSCULAR | Status: AC
  Administered 2021-09-11: 20 mL
  Filled 2021-09-11: qty 20

## 2021-09-11 MED ORDER — INSULIN ASPART 100 UNIT/ML IJ SOLN
0.0000 [IU] | INTRAMUSCULAR | Status: DC
  Administered 2021-09-12 (×2): 2 [IU] via SUBCUTANEOUS
  Administered 2021-09-12: 5 [IU] via SUBCUTANEOUS
  Administered 2021-09-12: 2 [IU] via SUBCUTANEOUS
  Administered 2021-09-13: 1 [IU] via SUBCUTANEOUS
  Administered 2021-09-13: 5 [IU] via SUBCUTANEOUS
  Filled 2021-09-11 (×7): qty 1

## 2021-09-11 MED ORDER — IOHEXOL 350 MG/ML SOLN
10.0000 mL | Freq: Once | INTRAVENOUS | Status: AC | PRN
  Administered 2021-09-11: 10 mL

## 2021-09-11 MED ORDER — FENTANYL CITRATE (PF) 100 MCG/2ML IJ SOLN
INTRAMUSCULAR | Status: AC | PRN
  Administered 2021-09-11 (×2): 50 ug via INTRAVENOUS

## 2021-09-11 MED ORDER — LIDOCAINE VISCOUS HCL 2 % MT SOLN
OROMUCOSAL | Status: AC
  Administered 2021-09-11: 5 mL via NASAL
  Filled 2021-09-11: qty 15

## 2021-09-11 MED ORDER — METHYLPREDNISOLONE SODIUM SUCC 40 MG IJ SOLR
30.0000 mg | INTRAMUSCULAR | Status: DC
  Administered 2021-09-12 – 2021-09-14 (×3): 30 mg via INTRAVENOUS
  Filled 2021-09-11 (×3): qty 1

## 2021-09-11 NOTE — Progress Notes (Signed)
Nutrition Follow-up  DOCUMENTATION CODES:   Severe malnutrition in context of chronic illness  INTERVENTION:   -Once feeding access is obtained, recommend:   2 carton (474 ml) Osmolite 1.5 via g-tube 4 times daily   70 ml free water flush before and after each feeding administration   Tube feeding regimen provides 2840 kcal (100% of needs), 119 grams of protein, and 1448 ml of H2O. 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  Ongoing  GOAL:   Patient will meet greater than or equal to 90% of their needs  Progressing   MONITOR:   Diet advancement, TF tolerance  REASON FOR ASSESSMENT:   Malnutrition Screening Tool    ASSESSMENT:   Pt admitted with chest pain d/t bilateral PNA. 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  5/25 EGD: stricture of upper esophageal opening, biopsy pending 5/26 SLP recommended NPO due to severe aspiration 5/27 Radiology consulted for NG tube (tip of tube confirmed in gastric antrum), TF initiated  Reviewed I/O's: +1.3 L x 24 hours and +9.6 L siunce admission  UOP: 250 ml x 24 hours   Case discussed with MD. Confirmed g-tube placed today. Inquired about ordering TF to start for tomorrow, however, MD stated needs IR clearance prior to initiating TF.   Labs reviewed: CBGS: 102-287 (inpatient orders for glycemic control are 0-9 units insulin aspart every 4 hours).     Diet Order:   Diet Order             Diet NPO time specified  Diet effective now                   EDUCATION NEEDS:   Education needs have been addressed  Skin:  Skin Assessment: Reviewed RN Assessment  Last BM:  09/06/21 (type 6)  Height:   Ht Readings from Last 1 Encounters:  09/03/21 6\' 5"  (1.956 m)     Weight:   Wt Readings from Last 1 Encounters:  09/11/21 70.8 kg   BMI:  Body mass index is 18.51 kg/m.  Estimated Nutritional Needs:   Kcal:  6712-4580  Protein:  115-130 grams  Fluid:  > 2 L    Levada Schilling, RD, LDN, CDCES Registered Dietitian II Certified Diabetes Care and Education Specialist Please refer to [facility] for RD and/or RD on-call/weekend/after hours pager

## 2021-09-11 NOTE — Progress Notes (Signed)
PROGRESS NOTE  Sean [name]:[id number] DOB: 1975/05/30   PCP: [name], MD  Patient is from: Home.  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  CTA chest on 5/23 negative for PE but acute pneumomediastinum with extension into the lower neck and bilateral lower lobe volume loss/possible consolidation concerning for pneumonia or rheumatoid lung disease.  CT chest with oral contrast on 5/23 confirmed pneumomediastinum and multiple focal and confluent infiltrates scattered throughout the lungs but no contrast extravasation. patient was started on broad-spectrum antibiotics with cefepime and Zithromax.  Pulmonology, GI and ENT consulted.  CT cervical spine without contrast.  CT cervical spine without contrast on 5/26 concerning for gas in the prevertebral space, carotid space, deep cervical space on the right likely an extension from known pneumomediastinum.  Patient had fluoroscopy guided Dobbhoff tube placed on 5/27.  CT soft tissue neck with contrast on 5/29 showed regression of upper pneumomediastinum and soft tissue emphysema but organized retropharyngeal gas or fluid concerning for developing abscess.  Imaging reviewed by me ENT who did not feel there is significant abscess for surgical drain.  NG tube removed on 5/31.  He had a barium swallow study concerning for posterior pharyngeal perforation.  ENT recommended transfer to tertiary center for repair.  Called [facility], and talked to Dr. [name] and ENT on call.   After he reviewed patient's imaging, he recommended strict n.p.o., good oral hygiene with chlorhexidine mouth rinse, good antibiotic coverage and G-tube for feeding to allow the posterior pharyngeal perforation heal on its own. He feels stitching or trying to repair the perforation would cause more tissue damage.  Dr. [name] did not feel transfer to [name] is necessary.  However, he suggested calling back if patient is worse.    IR placed G-tube on 6/2.  Will start tube feed on 6/3     Subjective: Seen and examined earlier this morning before he went down for G-tube placement.  Significant other at bedside.  Patient has no complaints.  Objective: Vitals:   09/11/21 1120 09/11/21 1125 09/11/21 1130 09/11/21 1215  BP: 102/75 106/76 108/76   Pulse: 92 81 80   Resp: (!) 23 (!) 21 17   Temp:    98.4 F (36.9 C)  TempSrc:    Oral  SpO2: 100% 99% 99%   Weight:      Height:        Examination:  GENERAL: No apparent distress.  Nontoxic. HEENT: MMM.  No trismus.  Voice clear.  Vision and hearing grossly intact. NECK: Supple.  No apparent JVD.  Right anterior cervical LAD. RESP:  No IWOB.  Fair aeration bilaterally. CVS:  RRR. Heart sounds normal.  ABD/GI/GU: BS+. Abd soft, NTND.  MSK/EXT:  Moves extremities. No apparent deformity. No edema.  SKIN: no apparent skin lesion or wound NEURO: Awake and alert. Oriented appropriately.  No apparent focal neuro deficit. PSYCH: Calm. Normal affect.   Procedures:  5/25-EGD 6/2-G-tube placed by IR.  Microbiology summarized: MRSA PCR screen nonreactive.  Assessment and Plan: * Multifocal pneumonia CT chest concerning for multifocal pneumonia.  He also have history of rheumatoid arthritis  with pulmonary involvement. He is on multiple immunosuppressants for rheumatoid arthritis.  He is also at risk for aspiration pneumonia due to dysphagia, dysphagia and recent candidal infection.  Surprisingly, he has not had any fever.  Tmax was 100.1 on  arrival.  He has no leukocytosis either.  He is on room air. -Vanco, cefepime and Zithromax 5/23-5/27>> Levaquin 5/30-5/31. -On IV Unasyn mainly for posterior pharyngeal perforation  Concern for retropharyngeal abscess/posterior pharyngeal perforation Initially CT soft tissue neck on 5/29 raises concern for developing retropharyngeal abscess.  Imaging reviewed by ENT, who didn't appreciate an abscess that warrants this surgical intervention.  Barium swallow study on 5/31 raises concern for posterior pharyngeal perforation per radiology and SLP.  EGD on 5/25 normal except for proximal esophageal stricture.  Esophageal biopsy negative for CMV or malignancy. In-house ENT recommends transfer to tertiary center for repair. On-call ENT at the [name], Dr. [name] recommended strict n.p.o., antibiotics, good oral hygiene and G-tube feeding.  Transfer not necessary unless patient decompensates -Appreciate help by SLP, pulmonology, ENT and GI. -Change antibiotic to IV Unasyn. -Added chlorhexidine mouth rinse -IR placed G-tube on 6/2. -We will start tube feed on 6/3.  Rheumatoid arthritis with pulmonary involvement On methotrexate, prednisone, CellCept and Plaquenil at home. -On IV Solu-Medrol per pulmonology  Elevated liver enzymes Recent Labs  Lab 09/10/21 0532 09/11/21 0511  AST 68*  --   ALT 85*  --   ALKPHOS 45  --   BILITOT 0.5  --   PROT 6.6  --   ALBUMIN 2.6* 2.7*  Unclear etiology of this.  He has no GI symptoms.  Continue monitoring  Dysphagia/odynophagia Due to posterior pharyngeal perforation and proximal esophageal stricture. -IR to place G-tube on 6/2. -Start tube feed on 6/3.  Severe protein calorie malnutrition/unintentional weight loss Body mass index is 18.51 kg/m. Nutrition Status: Nutrition Problem: Severe Malnutrition Etiology: chronic illness (RA, rheumatoid lung disease) Signs/Symptoms: severe fat depletion, mild muscle depletion, moderate muscle depletion, percent  weight loss Percent weight loss: 19 % Interventions: Prostat, Tube feeding  Uncontrolled NIDDM-2 with hyperglycemia A1c 11.9%.  No history of diabetes.  Likely due to chronic steroid use. Recent Labs  Lab 09/10/21 1145 09/10/21 1640 09/11/21 0750 09/11/21 1138 09/11/21 1629  GLUCAP 205* 102* 128* 162* 287*  -Change SSI to every 4 hours. -Will add basal if remains hyperglycemic   Hypomagnesemia Mg 1.8.  IV magnesium sulfate 2 g x 1      DVT prophylaxis:  enoxaparin (LOVENOX) injection 40 mg Start: 09/01/21 2200  Code Status: Full code Family Communication: Updated significant other at bedside. Level of care: Med-Surg.   Status is: Inpatient Remains inpatient appropriate because: Posterior pharyngeal perforation/possible retropharyngeal abscess   Final disposition: Likely home once medically cleared Consultants:  Pulmonology ENT in house and at [facility] Interventional radiology  Sch Meds:  Scheduled Meds:  Adalimumab  40 mg Subcutaneous Q14 Days   chlorhexidine  15 mL Mouth/Throat BID   diclofenac Sodium  2 g Topical QID   enoxaparin (LOVENOX) injection  40 mg Subcutaneous Q24H   feeding supplement (PROSource TF)  45 mL Per Tube BID   fentaNYL       fluticasone furoate-vilanterol  1 puff Inhalation Daily   And   umeclidinium bromide  1 puff Inhalation Daily   insulin aspart  0-9 Units Subcutaneous Q4H   methylPREDNISolone (SOLU-MEDROL) injection  40 mg Intravenous Q24H   midazolam       pantoprazole (PROTONIX) IV  40 mg Intravenous  Q24H   pneumococcal 20-valent conjugate vaccine  0.5 mL Intramuscular Tomorrow-1000   Vitamin D (Ergocalciferol)  50,000 Units Oral Weekly   Continuous Infusions:  sodium chloride 100 mL/hr at 09/11/21 1200   ampicillin-sulbactam (UNASYN) IV 3 g (09/11/21 1204)   PRN Meds:.sodium chloride, acetaminophen **OR** acetaminophen, HYDROmorphone (DILAUDID) injection, ipratropium-albuterol, magic mouthwash w/lidocaine,  ondansetron **OR** ondansetron (ZOFRAN) IV  Antimicrobials: Anti-infectives (From admission, onward)    Start     Dose/Rate Route Frequency Ordered Stop   09/11/21 0000  ceFAZolin (ANCEF) IVPB 2g/100 mL premix        2 g 200 mL/hr over 30 Minutes Intravenous To Radiology 09/10/21 1425 09/11/21 0700   09/09/21 1800  Ampicillin-Sulbactam (UNASYN) 3 g in sodium chloride 0.9 % 100 mL IVPB        3 g 200 mL/hr over 30 Minutes Intravenous Every 6 hours 09/09/21 1530     09/08/21 1000  levofloxacin (LEVAQUIN) tablet 750 mg  Status:  Discontinued        750 mg Per Tube Daily 09/08/21 0752 09/09/21 1530   09/02/21 2000  azithromycin (ZITHROMAX) 500 mg in sodium chloride 0.9 % 250 mL IVPB        500 mg 250 mL/hr over 60 Minutes Intravenous Every 24 hours 09/01/21 1929 09/05/21 2034   09/02/21 1330  micafungin (MYCAMINE) 150 mg in sodium chloride 0.9 % 100 mL IVPB  Status:  Discontinued        150 mg 107.5 mL/hr over 1 Hours Intravenous Every 24 hours 09/02/21 1215 09/04/21 1011   09/02/21 0900  sulfamethoxazole-trimethoprim (BACTRIM) 400-80 MG per tablet 1 tablet  Status:  Discontinued        1 tablet Oral Once per day on Mon Wed Fri 09/01/21 1958 09/03/21 0947   09/02/21 0701  ceFEPIme (MAXIPIME) 2 g in sodium chloride 0.9 % 100 mL IVPB  Status:  Discontinued        2 g 200 mL/hr over 30 Minutes Intravenous Every 12 hours 09/01/21 1933 09/01/21 2033   09/02/21 0700  vancomycin (VANCOREADY) IVPB 1250 mg/250 mL  Status:  Discontinued        1,250 mg 166.7 mL/hr over 90 Minutes Intravenous Every 12 hours 09/01/21 2034 09/02/21 1054   09/02/21 0400  ceFEPIme (MAXIPIME) 2 g in sodium chloride 0.9 % 100 mL IVPB        2 g 200 mL/hr over 30 Minutes Intravenous Every 8 hours 09/01/21 2033 09/06/21 2225   09/01/21 2200  hydroxychloroquine (PLAQUENIL) tablet 200 mg  Status:  Discontinued        200 mg Oral 2 times daily 09/01/21 1958 09/02/21 1241   09/01/21 1930  vancomycin (VANCOREADY) IVPB 1500  mg/300 mL        1,500 mg 150 mL/hr over 120 Minutes Intravenous  Once 09/01/21 1859 09/01/21 2206   09/01/21 1930  cefTRIAXone (ROCEPHIN) 2 g in sodium chloride 0.9 % 100 mL IVPB  Status:  Discontinued        2 g 200 mL/hr over 30 Minutes Intravenous Every 24 hours 09/01/21 1929 09/01/21 1929   09/01/21 1915  azithromycin (ZITHROMAX) 500 mg in sodium chloride 0.9 % 250 mL IVPB        500 mg 250 mL/hr over 60 Minutes Intravenous  Once 09/01/21 1909 09/01/21 2118   09/01/21 1900  ceFEPIme (MAXIPIME) 2 g in sodium chloride 0.9 % 100 mL IVPB        2 g 200 mL/hr over 30 Minutes Intravenous  Once 09/01/21 1859 09/01/21 1945        I have personally reviewed the following labs and images: CBC: Recent Labs  Lab 09/08/21 0513 09/09/21 0545 09/10/21 0532 09/11/21 0511  WBC 4.8 4.3 5.6 5.1  NEUTROABS  --   --  3.4  --   HGB 12.3* 12.1* 12.0* 12.1*  HCT 37.3* 35.4* 36.0* 36.5*  MCV 91.9 91.2 92.8 91.5  PLT 272 283 283 314   BMP &GFR Recent Labs  Lab 09/05/21 1623 09/06/21 0351 09/06/21 1704 09/07/21 0437 09/08/21 0513 09/09/21 0545 09/10/21 0532 09/11/21 0511  NA  --   --   --  134* 136 131* 137 136  K  --  4.2  --  4.0 4.5 3.9 4.8 4.2  CL  --   --   --  103 102 99 105 105  CO2  --   --   --  GLUCOSE  --   --   --  227* 215* 292* 104* 108*  BUN  --   --   --  CREATININE  --   --   --  0.63 0.70 0.57* 0.79 0.68  CALCIUM  --   --   --  8.8* 8.9 8.6* 8.8* 8.7*  MG 2.0 1.9 1.9  --   --   --  1.6* 1.8  PHOS 3.5 3.2 3.0  --   --   --  4.1 3.4   Estimated Creatinine Clearance: 118 mL/min (by C-G formula based on SCr of 0.68 mg/dL). Liver & Pancreas: Recent Labs  Lab 09/10/21 0532 09/11/21 0511  AST 68*  --   ALT 85*  --   ALKPHOS 45  --   BILITOT 0.5  --   PROT 6.6  --   ALBUMIN 2.6* 2.7*   No results for input(s): LIPASE, AMYLASE in the last 168 hours. No results for input(s): AMMONIA in the last 168 hours. Diabetic: Recent Labs     09/10/21 0532  HGBA1C 11.9*   Recent Labs  Lab 09/10/21 1145 09/10/21 1640 09/11/21 0750 09/11/21 1138 09/11/21 1629  GLUCAP 205* 102* 128* 162* 287*   Cardiac Enzymes: No results for input(s): CKTOTAL, CKMB, CKMBINDEX, TROPONINI in the last 168 hours. No results for input(s): PROBNP in the last 8760 hours. Coagulation Profile: Recent Labs  Lab 09/11/21 0511  INR 1.0   Thyroid Function Tests: No results for input(s): TSH, T4TOTAL, FREET4, T3FREE, THYROIDAB in the last 72 hours. Lipid Profile: No results for input(s): CHOL, HDL, LDLCALC, TRIG, CHOLHDL, LDLDIRECT in the last 72 hours. Anemia Panel: No results for input(s): VITAMINB12, FOLATE, FERRITIN, TIBC, IRON, RETICCTPCT in the last 72 hours. Urine analysis: No results found for: COLORURINE, APPEARANCEUR, LABSPEC, PHURINE, GLUCOSEU, HGBUR, BILIRUBINUR, KETONESUR, PROTEINUR, UROBILINOGEN, NITRITE, LEUKOCYTESUR Sepsis Labs: Invalid input(s): PROCALCITONIN, LACTICIDVEN  Microbiology: Recent Results (from the past 240 hour(s))  Surgical pcr screen     Status: None   Collection Time: 09/01/21 10:03 PM   Specimen: Nasal Mucosa; Nasal Swab  Result Value Ref Range Status   MRSA, PCR NEGATIVE NEGATIVE Final   Staphylococcus aureus NEGATIVE NEGATIVE Final    Comment: (NOTE) The Xpert SA Assay (FDA approved for NASAL specimens in patients 43 years of age and older), is one component of a comprehensive surveillance program. It is not intended to diagnose infection nor to guide or monitor treatment. Performed at [facility], [address] [place], [place] [address]  Radiology Studies: IR GASTROSTOMY TUBE MOD SED  Result Date: 09/11/2021 INDICATION: 46 year old male with history of esophageal perforation and retropharyngeal fluid collection requiring percutaneous enteric access. EXAM: PERC PLACEMENT GASTROSTOMY MEDICATIONS: The patient was receiving adequate antibiotic coverage administered in the appropriate  time frame prior to this procedure as an inpatient. ANESTHESIA/SEDATION: Versed 2 mg IV; Fentanyl 100 mcg IV Moderate Sedation Time:  14 The patient was continuously monitored during the procedure by the interventional radiology nurse under my direct supervision. CONTRAST:  10mL OMNIPAQUE IOHEXOL 350 MG/ML SOLN - administered into the gastric lumen. FLUOROSCOPY TIME:  Fluoroscopy Time: 1 minutes 36 seconds (3.8 mGy). COMPLICATIONS: None immediate. PROCEDURE: Informed written consent was obtained from the patient after a thorough discussion of the procedural risks, benefits and alternatives. All questions were addressed. Maximal Sterile barrier Technique was utilized including caps, mask, sterile gowns, sterile gloves, sterile drape, hand hygiene and skin antiseptic. A timeout was performed prior to the initiation of the procedure. The patient was placed on the procedure table in the supine position. Pre-procedure abdominal film confirmed visualization of the transverse colon. An angled 4-French catheter was passed through the nares into the stomach. The patient was prepped and draped in usual sterile fashion. The stomach was insufflated with air via the indwelling nasogastric tube. Under fluoroscopy, a puncture site was selected and local analgesia achieved with 1% lidocaine infiltrated subcutaneously. Under fluoroscopic guidance, a gastropexy needle was passed into the stomach and the T-bar suture was released. Entry into the stomach was confirmed with fluoroscopy, aspiration of air, and injection of contrast material. This was repeated with an additional gastropexy suture (for a total of 2 fasteners). At the center of these gastropexy sutures, a dermatotomy was performed. An 18 gauge needle was passed into the stomach at the site of this dermatotomy, and position within the gastric lumen again confirmed under fluoroscopy using aspiration of air and contrast injection. An Amplatz guidewire was passed through this  needle and intraluminal placement within the stomach was confirmed by fluoroscopy. The needle was removed. Over the guidewire, the percutaneous tract was dilated using a 10 mm non-compliant balloon. The balloon was deflated, then pushed into the gastric lumen followed in concert by the 20 Fr gastrostomy tube. The retention balloon of the percutaneous gastrostomy tube was inflated with 10 mL of sterile water. The tube was withdrawn until the retention balloon was at the edge of the gastric lumen. The external bumper was brought to the abdominal wall. Contrast was injected through the gastrostomy tube, confirming intraluminal positioning. The patient tolerated the procedure well without any immediate post-procedural complications. IMPRESSION: Technically successful placement of 20 Fr gastrostomy tube. Marliss Coots, MD [facility] Electronically Signed   By: Marliss Coots M.D.   On: 09/11/2021 11:37   DG Chest Port 1 View  Result Date: 09/11/2021 CLINICAL DATA:  Pneumonia. EXAM: PORTABLE CHEST 1 VIEW COMPARISON:  Sep 06, 2021. FINDINGS: The heart size and mediastinal contours are within normal limits. Increased bibasilar atelectasis or infiltrates are noted with small associated pleural effusions. The visualized skeletal structures are unremarkable. IMPRESSION: Increased bibasilar atelectasis or infiltrates are noted with associated small pleural effusions. Electronically Signed   By: Lupita Raider M.D.   On: 09/11/2021 10:07      Rasheen Schewe T. Demere Dotzler [facility]  If 7PM-7AM, please contact night-coverage [email or web address] 09/11/2021, 4:36 PM

## 2021-09-11 NOTE — Procedures (Signed)
Interventional Radiology Procedure Note ? ?Procedure: Placement of percutaneous 20F balloon-retention gastrostomy tube. ? ?Complications: None ? ?Recommendations: ?- NPO except for sips and chips remainder of today and overnight ?- Maintain G-tube to LWS until tomorrow morning  ?- May advance diet as tolerated and begin using tube tomorrow morning ? ? ?Sean Scheel, MD ?Pager: [phone] ? ? ? ?

## 2021-09-11 NOTE — Progress Notes (Signed)
PULMONOLOGY         Date: 09/11/2021,   MRN# [id number] Sean [name] [birth year]     AdmissionWeight: 72.6 kg                 CurrentWeight: 70.8 kg  Referring provider: Dr. Dareen Piano   CHIEF COMPLAINT:   Forceful cough with pneumomediastinum   HISTORY OF PRESENT ILLNESS   This is a 46 pleasant patient with a history of severe rheumatoid arthritis, seen previously while hospitalized due to bilateral multifocal pneumonia with treatment for community-acquired pneumonia and resolution.  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  Patient has been progressively dyspneic with reduction in spirometry performed in clinic via PFT.  Additional evaluation with rheumatology status post initiation of Humira infusions as well as CellCept and chronic prednisone use at higher dose starting 60 mg with slow taper.  On previous admission he also had moderate pleural effusion with resolution postthoracentesis and clinical improvement patient develops worsening symptoms when tapering below 45 mg daily including scaling of skin fingers and skin redness and erythema around nasolabial folds and worsening dyspnea and stiffness.  He came in on this admission with chest discomfort and severe dysphagia with cough.  He was seen in clinic and noted to have severe oral thrush status post ear nose and throat evaluation with course of Diflucan and partial resolution.  On examination there is still erythema in the upper airway and lower airway as well as remnants of thrush visible in the lower airway.  CT chest independently reviewed with findings of pneumomediastinum without esophageal perforation.  Overall interval improvement in lung parenchyma bilaterally.  Pulmonary critical care consultation placed for additional evaluation management   09/03/21-  patient seen at bedside. He has continued dysphagia worse  with pill swallowing. I have changed most of his PO meds to IV for now. Discussed medical plan with family and patient at bedside. Reviewed case with GI today there is plan for EGD. Reviewed CXR this am, mild b/l pleural effusions worse from prior but not clinicallyh significant, pneumonmediastinum is stable. Will continue to follow.   09/04/21 - patient seems improved.  He continues to have pain with swallowing. 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 There were biopsies done. We will obtain CT neck to eval cervical spine  09/07/21- patient clinically improved with less neck pain and less dysphagia. He had repeat CT neck with findings of fluid collection possible abscess.  Reviewed with Dr Dareen Piano and sent message to Dr Elenore Rota ENT for evaluation. He remains on cefepime.   09/08/21- Patient is s/p ENT evaluation with no findings to suggest abscess. Ive ordered repeat SLP to allow patient PO nourishement. He remains on levofloxacin and solumedrol for now and is clinically improved.   09/09/21- patient had eventful day. He is s/p SLP with findings of perforated hypopharynx and s/p ENT evaluation. He is to be NPO for now and possibly transfer to tertiary facility that may be able to offer head & neck surgery. He is reporting improvement in dysphagia and neck pain. 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   09/10/21- patient had eventful day. He has PEG tube planned for AM.   09/11/21- pt s/p PEG tube.  He had mild episodes of hemoptysis today states its just clotted material from throat. He is doing better with throat pain and dysphagia.  I have reduced  steroids today.  He had CXR which is stable reviewed by me.  He has abd pain post PEG.  He has not had BM.  He is not coughing and is stable on room air. He is ambulatory with belly discomfort. Remains NPO .      PAST MEDICAL HISTORY   Past Medical History:  Diagnosis Date   H/O immunosuppressive therapy     Hyperpigmentation of skin    Hyperpigmentation rash/vesicles of the palm and face   Neutropenia (HCC)    RA (rheumatoid arthritis) (HCC)      SURGICAL HISTORY   Past Surgical History:  Procedure Laterality Date   ESOPHAGOGASTRODUODENOSCOPY N/A 09/03/2021   Procedure: ESOPHAGOGASTRODUODENOSCOPY (EGD);  Surgeon: Regis Bill, MD;  Location: [facility] ENDOSCOPY;  Service: Endoscopy;  Laterality: N/A;     FAMILY HISTORY   Family History  Problem Relation Age of Onset   Hyperlipidemia Mother    Hypertension Father      SOCIAL HISTORY   Social History   Tobacco Use   Smoking status: Former    Types: Cigarettes   Smokeless tobacco: Never  Vaping Use   Vaping Use: Never used  Substance Use Topics   Alcohol use: Not Currently   Drug use: Not Currently    Types: Marijuana     MEDICATIONS    Home Medication:    Current Medication:  Current Facility-Administered Medications:    0.9 %  sodium chloride infusion, , Intravenous, PRN, Leeroy Bock, MD, Last Rate: 10 mL/hr at 09/11/21 0800, Infusion Verify at 09/11/21 0800   acetaminophen (TYLENOL) tablet 650 mg, 650 mg, Oral, Q6H PRN, 650 mg at 09/04/21 1000 **OR** acetaminophen (TYLENOL) suppository 650 mg, 650 mg, Rectal, Q6H PRN, Mansy, Jan A, MD   Adalimumab PNKT 40 mg, 40 mg, Subcutaneous, Q14 Days, Jamelle Rushing L, MD, 40 mg at 09/03/21 0912   Ampicillin-Sulbactam (UNASYN) 3 g in sodium chloride 0.9 % 100 mL IVPB, 3 g, Intravenous, Q6H, Gonfa, Taye T, MD, Stopped at 09/11/21 0604   chlorhexidine (PERIDEX) 0.12 % solution 15 mL, 15 mL, Mouth/Throat, BID, Gonfa, Taye T, MD, 15 mL at 09/10/21 2127   diclofenac Sodium (VOLTAREN) 1 % topical gel 2 g, 2 g, Topical, QID, Jamelle Rushing L, MD, 2 g at 09/10/21 2128   enoxaparin (LOVENOX) injection 40 mg, 40 mg, Subcutaneous, Q24H, Mansy, Jan A, MD, 40 mg at 09/08/21 2253   feeding supplement (PROSource TF) liquid 45 mL, 45 mL, Per Tube, BID, Jamelle Rushing L,  MD, 45 mL at 09/09/21 0902   fluticasone furoate-vilanterol (BREO ELLIPTA) 200-25 MCG/ACT 1 puff, 1 puff, Inhalation, Daily, 1 puff at 09/10/21 0933 **AND** umeclidinium bromide (INCRUSE ELLIPTA) 62.5 MCG/ACT 1 puff, 1 puff, Inhalation, Daily, Mansy, Jan A, MD, 1 puff at 09/10/21 0934   HYDROmorphone (DILAUDID) injection 0.5 mg, 0.5 mg, Intravenous, Q4H PRN, Jamelle Rushing L, MD, 0.5 mg at 09/11/21 0531   insulin aspart (novoLOG) injection 0-9 Units, 0-9 Units, Subcutaneous, TID WC, Leeroy Bock, MD, 3 Units at 09/10/21 1333   ipratropium-albuterol (DUONEB) 0.5-2.5 (3) MG/3ML nebulizer solution 3 mL, 3 mL, Nebulization, Q4H PRN, Mansy, Jan A, MD   magic mouthwash w/lidocaine, 5 mL, Oral, TID PRN, Leeroy Bock, MD, 5 mL at 09/10/21 1854   methylPREDNISolone sodium succinate (SOLU-MEDROL) 40 mg/mL injection 40 mg, 40 mg, Intravenous, Q24H, Muskaan Smet, MD, 40 mg at 09/10/21 0916   ondansetron (ZOFRAN) tablet 4 mg, 4 mg, Oral, Q6H PRN **OR** ondansetron (ZOFRAN) injection 4 mg,  4 mg, Intravenous, Q6H PRN, Mansy, Jan A, MD   pantoprazole (PROTONIX) injection 40 mg, 40 mg, Intravenous, Q24H, Verlene Glantz, MD, 40 mg at 09/10/21 0941   pneumococcal 20-valent conjugate vaccine (PREVNAR 20) injection 0.5 mL, 0.5 mL, Intramuscular, Tomorrow-1000, Mansy, Vernetta HoneyJan A, MD   Vitamin D (Ergocalciferol) (DRISDOL) capsule 50,000 Units, 50,000 Units, Oral, Weekly, Mansy, Vernetta HoneyJan A, MD, 50,000 Units at 09/09/21 0902    ALLERGIES   Patient has no known allergies.     REVIEW OF SYSTEMS    Review of Systems:  Gen:  Denies  fever, sweats, chills weigh loss  HEENT: Denies blurred vision, double vision, ear pain, eye pain, hearing loss, nose bleeds, sore throat Cardiac:  No dizziness, chest pain or heaviness, chest tightness,edema Resp:   reports dyspnea chronically  Gi: Denies swallowing difficulty, stomach pain, nausea or vomiting, diarrhea, constipation, bowel incontinence Gu:  Denies bladder  incontinence, burning urine Ext:   Denies Joint pain, stiffness or swelling Skin: Denies  skin rash, easy bruising or bleeding or hives Endoc:  Denies polyuria, polydipsia , polyphagia or weight change Psych:   Denies depression, insomnia or hallucinations   Other:  All other systems negative   VS: BP 109/74 (BP Location: Right Arm)   Pulse 81   Temp 98.3 F (36.8 C) (Oral)   Resp 16   Ht 6\' 5"  (1.956 m)   Wt 70.8 kg   SpO2 99%   BMI 18.51 kg/m      PHYSICAL EXAM    GENERAL:NAD, no fevers, chills, no weakness no fatigue HEAD: Normocephalic, atraumatic.  EYES: Pupils equal, round, reactive to light. Extraocular muscles intact. No scleral icterus.  MOUTH: Moist mucosal membrane. Dentition intact. No abscess noted.  EAR, NOSE, THROAT: Clear without exudates. No external lesions.  NECK: Supple. No thyromegaly. No nodules. No JVD.  PULMONARY: decreased breath sounds with mild rhonchi worse at bases bilaterally.  CARDIOVASCULAR: S1 and S2. Regular rate and rhythm. No murmurs, rubs, or gallops. No edema. Pedal pulses 2+ bilaterally.  GASTROINTESTINAL: Soft, nontender, nondistended. No masses. Positive bowel sounds. No hepatosplenomegaly.  MUSCULOSKELETAL: No swelling, clubbing, or edema. Range of motion full in all extremities.  NEUROLOGIC: Cranial nerves II through XII are intact. No gross focal neurological deficits. Sensation intact. Reflexes intact.  SKIN: No ulceration, lesions, rashes, or cyanosis. Skin warm and dry. Turgor intact.  PSYCHIATRIC: Mood, affect within normal limits. The patient is awake, alert and oriented x 3. Insight, judgment intact.       IMAGING        ASSESSMENT/PLAN   Subacute chest discomfort and severe dysphagia Secondary to forceful cough likely due to recurrent candidiasis with thrush -Failure of Diflucan p.o. and outpatient status post ENT evaluation -s/p micafungin IV for now -Supportive care for pneumomediastinum including reduction of  shearing forces, avoid incentive spirometry, BiPAP/CPAP, flutter valve -Agree with empiric cefepime and Zithromax narrowed to levofloxacin -GI consultation-s/p EGD -continue nasal canula O2 for supportive care to allow diffusion of nitrogen rich gas out of soft tissue   Severe dysphagia   - s/p EGD - severe narrowing of distal esophagus s/p biopsies  - CT cervical spine today -speech and swallow evaluation- nPO -repeat barium SLP - perforated hypopharynx -for PEG tube  Recurrent bilateral pleural effusion Previous thoracentesis with fluid pattern showing mildly ascitic eosinophilic predominant exudate consistent with rheumatoid associated effusions -Have DC'd IVF due to likely contributing to redevelopment of pleural effusion -Clinically patient is not dehydrated   Interstitial lung disease associated with  rheumatoid arthritis -Continue planned Humira infusion, Have stopped cellcept PO for now due to severe dysphagia, have changed solumedrol to IV -I have stopped Plaquenil for now due to report of visual disturbances by patient which is a known adverse effect.   -Reviewed repeat CXR today  Thank you for allowing me to participate in the care of this patient.   Patient/Family are satisfied with care plan and all questions have been answered.    Provider disclosure: Patient with at least one acute or chronic illness or injury that poses a threat to life or bodily function and is being managed actively during this encounter.  All of the below services have been performed independently by signing provider:  review of prior documentation from internal and or external health records.  Review of previous and current lab results.  Interview and comprehensive assessment during patient visit today. Review of current and previous chest radiographs/CT scans. Discussion of management and test interpretation with health care team and patient/family.   This document was prepared using Dragon voice  recognition software and may include unintentional dictation errors.     Vida Rigger, M.D.  Division of Pulmonary & Critical Care Medicine

## 2021-09-12 DIAGNOSIS — E876 Hypokalemia: Secondary | ICD-10-CM

## 2021-09-12 DIAGNOSIS — E878 Other disorders of electrolyte and fluid balance, not elsewhere classified: Secondary | ICD-10-CM

## 2021-09-12 LAB — COMPREHENSIVE METABOLIC PANEL
ALT: 79 U/L — ABNORMAL HIGH (ref 0–44)
AST: 59 U/L — ABNORMAL HIGH (ref 15–41)
Albumin: 2.5 g/dL — ABNORMAL LOW (ref 3.5–5.0)
Alkaline Phosphatase: 45 U/L (ref 38–126)
Anion gap: 6 (ref 5–15)
BUN: 11 mg/dL (ref 6–20)
CO2: 23 mmol/L (ref 22–32)
Calcium: 8.9 mg/dL (ref 8.9–10.3)
Chloride: 106 mmol/L (ref 98–111)
Creatinine, Ser: 0.58 mg/dL — ABNORMAL LOW (ref 0.61–1.24)
GFR, Estimated: >60 mL/min (ref >60)
Glucose, Bld: 109 mg/dL — ABNORMAL HIGH (ref 70–99)
Potassium: 3.4 mmol/L — ABNORMAL LOW (ref 3.5–5.1)
Sodium: 135 mmol/L (ref 135–145)
Total Bilirubin: 0.5 mg/dL (ref 0.3–1.2)
Total Protein: 6.6 g/dL (ref 6.5–8.1)

## 2021-09-12 LAB — CBC
HCT: 36.3 % — ABNORMAL LOW (ref 39.0–52.0)
Hemoglobin: 12.3 g/dL — ABNORMAL LOW (ref 13.0–17.0)
MCH: 30.8 pg (ref 26.0–34.0)
MCHC: 33.9 g/dL (ref 30.0–36.0)
MCV: 91 fL (ref 80.0–100.0)
Platelets: 308 10*3/uL (ref 150–400)
RBC: 3.99 MIL/uL — ABNORMAL LOW (ref 4.22–5.81)
RDW: 12.7 % (ref 11.5–15.5)
WBC: 5.3 10*3/uL (ref 4.0–10.5)
nRBC: 0 % (ref 0.0–0.2)

## 2021-09-12 LAB — MAGNESIUM: Magnesium: 1.9 mg/dL (ref 1.7–2.4)

## 2021-09-12 LAB — GLUCOSE, CAPILLARY
Glucose-Capillary: 106 mg/dL — ABNORMAL HIGH (ref 70–99)
Glucose-Capillary: 110 mg/dL — ABNORMAL HIGH (ref 70–99)
Glucose-Capillary: 171 mg/dL — ABNORMAL HIGH (ref 70–99)
Glucose-Capillary: 173 mg/dL — ABNORMAL HIGH (ref 70–99)
Glucose-Capillary: 179 mg/dL — ABNORMAL HIGH (ref 70–99)
Glucose-Capillary: 181 mg/dL — ABNORMAL HIGH (ref 70–99)
Glucose-Capillary: 278 mg/dL — ABNORMAL HIGH (ref 70–99)
Glucose-Capillary: 83 mg/dL (ref 70–99)

## 2021-09-12 LAB — PHOSPHORUS: Phosphorus: 3.4 mg/dL (ref 2.5–4.6)

## 2021-09-12 MED ORDER — OSMOLITE 1.5 CAL PO LIQD
474.0000 mL | Freq: Four times a day (QID) | ORAL | Status: DC
Start: 2021-09-12 — End: 2021-09-12

## 2021-09-12 MED ORDER — FREE WATER: 70.0000 mL | Freq: Four times a day (QID) | Status: DC

## 2021-09-12 MED ORDER — OSMOLITE 1.5 CAL PO LIQD
237.0000 mL | Freq: Four times a day (QID) | ORAL | Status: AC
  Administered 2021-09-12 (×2): 237 mL

## 2021-09-12 MED ORDER — POTASSIUM CHLORIDE 20 MEQ PO PACK
40.0000 meq | PACK | Freq: Once | ORAL | Status: AC
Start: 2021-09-12 — End: 2021-09-12
  Administered 2021-09-12: 40 meq
  Filled 2021-09-12: qty 2

## 2021-09-12 MED ORDER — POTASSIUM CHLORIDE 10 MEQ/100ML IV SOLN
10.0000 meq | INTRAVENOUS | Status: DC
  Filled 2021-09-12 (×4): qty 100

## 2021-09-12 MED ORDER — FREE WATER
140.0000 mL | Freq: Four times a day (QID) | Status: DC
  Administered 2021-09-12 – 2021-09-14 (×8): 140 mL

## 2021-09-12 MED ORDER — OSMOLITE 1.5 CAL PO LIQD
474.0000 mL | Freq: Four times a day (QID) | ORAL | Status: DC
  Administered 2021-09-13 – 2021-09-14 (×6): 474 mL

## 2021-09-12 MED ORDER — POTASSIUM CHLORIDE 20 MEQ PO PACK
40.0000 meq | PACK | Freq: Once | ORAL | Status: AC
  Administered 2021-09-12: 40 meq
  Filled 2021-09-12: qty 2

## 2021-09-12 NOTE — Assessment & Plan Note (Signed)
Hypomagnesemia resolved. -KCl 40x2 per tube

## 2021-09-12 NOTE — Plan of Care (Signed)

## 2021-09-12 NOTE — Progress Notes (Signed)
G-tube discontinued from suctioning for K+ replacement and morning meds. Patient and significant other educated on use of tube. Patient and significant other educated on flushing, keeping tube patent, crushing pills, and when to seek help with declogging tube. Patient and significant other attentive, asking questions, and involved in care. Will continue to educate and encourage patient to ask questions as they arise.

## 2021-09-12 NOTE — Progress Notes (Signed)
PULMONOLOGY         Date: 09/12/2021,   MRN# [id number] Sean [name] 1975/08/09     AdmissionWeight: 72.6 kg                 CurrentWeight: 71 kg  Referring provider: Dr. Dareen Piano   CHIEF COMPLAINT:   Forceful cough with pneumomediastinum   HISTORY OF PRESENT ILLNESS   This is a 46 pleasant patient with a history of severe rheumatoid arthritis, seen previously while hospitalized due to bilateral multifocal pneumonia with treatment for community-acquired pneumonia and resolution.  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  Patient has been progressively dyspneic with reduction in spirometry performed in clinic via PFT.  Additional evaluation with rheumatology status post initiation of Humira infusions as well as CellCept and chronic prednisone use at higher dose starting 60 mg with slow taper.  On previous admission he also had moderate pleural effusion with resolution postthoracentesis and clinical improvement patient develops worsening symptoms when tapering below 45 mg daily including scaling of skin fingers and skin redness and erythema around nasolabial folds and worsening dyspnea and stiffness.  He came in on this admission with chest discomfort and severe dysphagia with cough.  He was seen in clinic and noted to have severe oral thrush status post ear nose and throat evaluation with course of Diflucan and partial resolution.  On examination there is still erythema in the upper airway and lower airway as well as remnants of thrush visible in the lower airway.  CT chest independently reviewed with findings of pneumomediastinum without esophageal perforation.  Overall interval improvement in lung parenchyma bilaterally.  Pulmonary critical care consultation placed for additional evaluation management   09/03/21-  patient seen at bedside. He has continued dysphagia worse  with pill swallowing. I have changed most of his PO meds to IV for now. Discussed medical plan with family and patient at bedside. Reviewed case with GI today there is plan for EGD. Reviewed CXR this am, mild b/l pleural effusions worse from prior but not clinicallyh significant, pneumonmediastinum is stable. Will continue to follow.   09/04/21 - patient seems improved.  He continues to have pain with swallowing. 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 There were biopsies done. We will obtain CT neck to eval cervical spine  09/07/21- patient clinically improved with less neck pain and less dysphagia. He had repeat CT neck with findings of fluid collection possible abscess.  Reviewed with Dr Dareen Piano and sent message to Dr Elenore Rota ENT for evaluation. He remains on cefepime.   09/08/21- Patient is s/p ENT evaluation with no findings to suggest abscess. Ive ordered repeat SLP to allow patient PO nourishement. He remains on levofloxacin and solumedrol for now and is clinically improved.   09/09/21- patient had eventful day. He is s/p SLP with findings of perforated hypopharynx and s/p ENT evaluation. He is to be NPO for now and possibly transfer to tertiary facility that may be able to offer head & neck surgery. He is reporting improvement in dysphagia and neck pain. 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   09/10/21- patient had eventful day. He has PEG tube planned for AM.   09/11/21- pt s/p PEG tube.  He had mild episodes of hemoptysis today states its just clotted material from throat. He is doing better with throat pain and dysphagia.  I have reduced  steroids today.  He had CXR which is stable reviewed by me.  He has abd pain post PEG.  He has not had BM.  He is not coughing and is stable on room air. He is ambulatory with belly discomfort. Remains NPO .   09/12/21- patient is stable on room air.  He is reporting further improvement in throat pain. He is in  process of using PEG. Reviewed medical plan with Dr Alanda Slim. RD for gastrostomy education today. Plan for dc in am. Will plan to continue prednisone at  via PEG to be tapered by  per week. Continue Cellcept 500 BID, plaquenil 200 (patient is asked to dc this if he has any recurrence of previously noted visual disturbances), continue Humira Belva as previous. Will follow up in clinic w/in 7d post DC.     PAST MEDICAL HISTORY   Past Medical History:  Diagnosis Date  . H/O immunosuppressive therapy   . Hyperpigmentation of skin    Hyperpigmentation rash/vesicles of the palm and face  . Neutropenia (HCC)   . RA (rheumatoid arthritis) (HCC)      SURGICAL HISTORY   Past Surgical History:  Procedure Laterality Date  . ESOPHAGOGASTRODUODENOSCOPY N/A 09/03/2021   Procedure: ESOPHAGOGASTRODUODENOSCOPY (EGD);  Surgeon: Regis Bill, MD;  Location: [facility];  Service: Endoscopy;  Laterality: N/A;  . IR GASTROSTOMY TUBE MOD SED  09/11/2021     FAMILY HISTORY   Family History  Problem Relation Age of Onset  . Hyperlipidemia Mother   . Hypertension Father      SOCIAL HISTORY   Social History   Tobacco Use  . Smoking status: Former    Types: Cigarettes  . Smokeless tobacco: Never  Vaping Use  . Vaping Use: Never used  Substance Use Topics  . Alcohol use: Not Currently  . Drug use: Not Currently    Types: Marijuana     MEDICATIONS    Home Medication:    Current Medication:  Current Facility-Administered Medications:  .  0.9 %  sodium chloride infusion, , Intravenous, PRN, Leeroy Bock, MD, Stopped at 09/11/21 1600 .  acetaminophen (TYLENOL) tablet 650 mg, 650 mg, Oral, Q6H PRN, 650 mg at 09/04/21 1000 **OR** acetaminophen (TYLENOL) suppository 650 mg, 650 mg, Rectal, Q6H PRN, Mansy, Jan A, MD .  Adalimumab PNKT 40 mg, 40 mg, Subcutaneous, Q14 Days, Leeroy Bock, MD, 40 mg at 09/03/21 0912 .  Ampicillin-Sulbactam (UNASYN) 3 g in sodium chloride  0.9 % 100 mL IVPB, 3 g, Intravenous, Q6H, Almon Hercules, MD, Stopped at 09/12/21 1610 .  chlorhexidine (PERIDEX) 0.12 % solution 15 mL, 15 mL, Mouth/Throat, BID, Gonfa, Taye T, MD, 15 mL at 09/12/21 1052 .  diclofenac Sodium (VOLTAREN) 1 % topical gel 2 g, 2 g, Topical, QID, Leeroy Bock, MD, 2 g at 09/11/21 2127 .  enoxaparin (LOVENOX) injection 40 mg, 40 mg, Subcutaneous, Q24H, Mansy, Jan A, MD, 40 mg at 09/08/21 2253 .  feeding supplement (PROSource TF) liquid 45 mL, 45 mL, Per Tube, BID, Jamelle Rushing L, MD, 45 mL at 09/12/21 1051 .  fluticasone furoate-vilanterol (BREO ELLIPTA) 200-25 MCG/ACT 1 puff, 1 puff, Inhalation, Daily, 1 puff at 09/11/21 0951 **AND** umeclidinium bromide (INCRUSE ELLIPTA) 62.5 MCG/ACT 1 puff, 1 puff, Inhalation, Daily, Mansy, Jan A, MD, 1 puff at 09/11/21 0951 .  HYDROmorphone (DILAUDID) injection 0.5 mg, 0.5 mg, Intravenous, Q4H PRN, Leeroy Bock, MD, 0.5 mg at 09/12/21 0842 .  insulin aspart (novoLOG) injection 0-9 Units, 0-9  Units, Subcutaneous, Q4H, Almon Hercules, MD, 2 Units at 09/12/21 0013 .  ipratropium-albuterol (DUONEB) 0.5-2.5 (3) MG/3ML nebulizer solution 3 mL, 3 mL, Nebulization, Q4H PRN, Mansy, Jan A, MD .  magic mouthwash w/lidocaine, 5 mL, Oral, TID PRN, Leeroy Bock, MD, 5 mL at 09/11/21 1748 .  methylPREDNISolone sodium succinate (SOLU-MEDROL) 40 mg/mL injection 30 mg, 30 mg, Intravenous, Q24H, Ziad Maye, MD, 30 mg at 09/12/21 1050 .  ondansetron (ZOFRAN) tablet 4 mg, 4 mg, Oral, Q6H PRN **OR** ondansetron (ZOFRAN) injection 4 mg, 4 mg, Intravenous, Q6H PRN, Mansy, Jan A, MD .  pantoprazole (PROTONIX) injection 40 mg, 40 mg, Intravenous, Q24H, Arcadio Cope, MD, 40 mg at 09/12/21 1051 .  pneumococcal 20-valent conjugate vaccine (PREVNAR 20) injection 0.5 mL, 0.5 mL, Intramuscular, Tomorrow-1000, Mansy, Vernetta Honey, MD .  Vitamin D (Ergocalciferol) (DRISDOL) capsule 50,000 Units, 50,000 Units, Oral, Weekly, Mansy, Vernetta Honey, MD,  50,000 Units at 09/09/21 0902    ALLERGIES   Patient has no known allergies.     REVIEW OF SYSTEMS    Review of Systems:  Gen:  Denies  fever, sweats, chills weigh loss  HEENT: Denies blurred vision, double vision, ear pain, eye pain, hearing loss, nose bleeds, sore throat Cardiac:  No dizziness, chest pain or heaviness, chest tightness,edema Resp:   reports dyspnea chronically  Gi: Denies swallowing difficulty, stomach pain, nausea or vomiting, diarrhea, constipation, bowel incontinence Gu:  Denies bladder incontinence, burning urine Ext:   Denies Joint pain, stiffness or swelling Skin: Denies  skin rash, easy bruising or bleeding or hives Endoc:  Denies polyuria, polydipsia , polyphagia or weight change Psych:   Denies depression, insomnia or hallucinations   Other:  All other systems negative   VS: BP 98/73 (BP Location: Right Arm)   Pulse 84   Temp 98.1 F (36.7 C) (Oral)   Resp 18   Ht  (1.956 m)   Wt 71 kg   SpO2 98%   BMI 18.56 kg/m      PHYSICAL EXAM    GENERAL:NAD, no fevers, chills, no weakness no fatigue HEAD: Normocephalic, atraumatic.  EYES: Pupils equal, round, reactive to light. Extraocular muscles intact. No scleral icterus.  MOUTH: Moist mucosal membrane. Dentition intact. No abscess noted.  EAR, NOSE, THROAT: Clear without exudates. No external lesions.  NECK: Supple. No thyromegaly. No nodules. No JVD.  PULMONARY: decreased breath sounds with mild rhonchi worse at bases bilaterally.  CARDIOVASCULAR: S1 and S2. Regular rate and rhythm. No murmurs, rubs, or gallops. No edema. Pedal pulses 2+ bilaterally.  GASTROINTESTINAL: Soft, nontender, nondistended. No masses. Positive bowel sounds. No hepatosplenomegaly.  MUSCULOSKELETAL: No swelling, clubbing, or edema. Range of motion full in all extremities.  NEUROLOGIC: Cranial nerves II through XII are intact. No gross focal neurological deficits. Sensation intact. Reflexes intact.  SKIN: No  ulceration, lesions, rashes, or cyanosis. Skin warm and dry. Turgor intact.  PSYCHIATRIC: Mood, affect within normal limits. The patient is awake, alert and oriented x 3. Insight, judgment intact.       IMAGING        ASSESSMENT/PLAN   Subacute chest discomfort and severe dysphagia Secondary to forceful cough likely due to recurrent candidiasis with thrush -Failure of Diflucan p.o. and outpatient status post ENT evaluation -s/p micafungin IV for now -Supportive care for pneumomediastinum including reduction of shearing forces, avoid incentive spirometry, BiPAP/CPAP, flutter valve -Agree with empiric cefepime and Zithromax narrowed to levofloxacin -GI consultation-s/p EGD -continue nasal canula O2 for supportive care to  allow diffusion of nitrogen rich gas out of soft tissue   Severe dysphagia   - s/p EGD - severe narrowing of distal esophagus s/p biopsies  - CT cervical spine today -speech and swallow evaluation- nPO -repeat barium SLP - perforated hypopharynx -for PEG tube  Recurrent bilateral pleural effusion Previous thoracentesis with fluid pattern showing mildly ascitic eosinophilic predominant exudate consistent with rheumatoid associated effusions -Have DC'd IVF due to likely contributing to redevelopment of pleural effusion -Clinically patient is not dehydrated   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   -Reviewed repeat CXR today  Thank you for allowing me to participate in the care of this patient.   Patient/Family are satisfied with care plan and all questions have been answered.    Provider disclosure: Patient with at least one acute or chronic illness or injury that poses a threat to life or bodily  function and is being managed actively during this encounter.  All of the below services have been performed independently by signing provider:  review of prior documentation from internal and or external health records.  Review of previous and current lab results.  Interview and comprehensive assessment during patient visit today. Review of current and previous chest radiographs/CT scans. Discussion of management and test interpretation with health care team and patient/family.   This document was prepared using Dragon voice recognition software and may include unintentional dictation errors.     Vida Rigger, M.D.  Division of Pulmonary & Critical Care Medicine

## 2021-09-12 NOTE — Progress Notes (Signed)
PROGRESS NOTE  Sean [name] [id number] DOB: 11-07-1975   PCP: Casilda Carls, MD  Patient is from: Home.  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  CTA chest on 5/23 negative for PE but acute pneumomediastinum with extension into the lower neck and bilateral lower lobe volume loss/possible consolidation concerning for pneumonia or rheumatoid lung disease.  CT chest with oral contrast on 5/23 confirmed pneumomediastinum and multiple focal and confluent infiltrates scattered throughout the lungs but no contrast extravasation. patient was started on broad-spectrum antibiotics with cefepime and Zithromax.  Pulmonology, GI and ENT consulted.  CT cervical spine without contrast.  CT cervical spine without contrast on 5/26 concerning for gas in the prevertebral space, carotid space, deep cervical space on the right likely an extension from known pneumomediastinum.  Patient had fluoroscopy guided Dobbhoff tube placed on 5/27.  CT soft tissue neck with contrast on 5/29 showed regression of upper pneumomediastinum and soft tissue emphysema but organized retropharyngeal gas or fluid concerning for developing abscess.  Imaging reviewed by me ENT who did not feel there is significant abscess for surgical drain.  NG tube removed on 5/31.  He had a barium swallow study concerning for posterior pharyngeal perforation.  ENT recommended transfer to tertiary center for repair.  Called [facility], and talked to [name] and ENT on call.   After he reviewed patient's imaging, he recommended strict n.p.o., good oral hygiene with chlorhexidine mouth rinse, good antibiotic coverage and G-tube for feeding to allow the posterior pharyngeal perforation heal on its own. He feels stitching or trying to repair the perforation would cause more tissue damage.  [name] did not feel transfer to [name] is necessary.  However, he suggested calling back if patient is worse.    IR placed G-tube on 6/2.  Bolus tube feeding started on 6/3.  Plan for discharge home on 6/4 once he tolerates tube feed.     Subjective: Seen and examined earlier this morning.  No major events overnight or this morning.  A little sore at the G-tube site.  Feels better from odynophagia standpoint.  No other complaints.  Objective: Vitals:   09/12/21 0500 09/12/21 0754 09/12/21 1217 09/12/21 1342  BP:  98/73 106/71 113/84  Pulse:  84 81 80  Resp:  18 17 18   Temp:  98.1 F (36.7 C) 98.2 F (36.8 C) 98.1 F (36.7 C)  TempSrc:  Oral  Oral  SpO2:  98% 98% 97%  Weight: 71 kg     Height:        Examination: This patient is about to be  GENERAL: No apparent distress.  Nontoxic. HEENT: MMM.  Vision and hearing grossly intact.  NECK: Full ROM.  Right anterior cervical LAD RESP:  No IWOB.  Fair aeration bilaterally. CVS:  RRR. Heart sounds normal.  ABD/GI/GU: BS+. Abd soft, NTND.  G-tube in place. MSK/EXT:  Moves extremities. No apparent deformity. No edema.  SKIN: no apparent skin lesion or wound NEURO: Awake and alert. Oriented appropriately.  No apparent focal neuro deficit. PSYCH: Calm. Normal affect.  Procedures:  5/25-EGD 6/2-G-tube placed by IR.  Microbiology  summarized: MRSA PCR screen nonreactive.  Assessment and Plan: * Multifocal pneumonia CT chest concerning for multifocal pneumonia.  He also have history of rheumatoid arthritis with pulmonary involvement. He is on multiple immunosuppressants for rheumatoid arthritis.  He is also at risk for  aspiration pneumonia due to dysphagia, dysphagia and recent candidal infection.  Surprisingly, he has not had any fever.  Tmax was 100.1 on arrival.  He has no leukocytosis either.  He is on room air. -Vanco, cefepime and Zithromax 5/23-5/27>> Levaquin 5/30-5/31. -On IV Unasyn mainly for posterior pharyngeal perforation  Concern for retropharyngeal abscess/posterior pharyngeal perforation Initially CT soft tissue neck on 5/29 raises concern for developing retropharyngeal abscess.  Imaging reviewed by ENT, who didn't appreciate an abscess that warrants this surgical intervention.  Barium swallow study on 5/31 raises concern for posterior pharyngeal perforation per radiology and SLP.  EGD on 5/25 normal except for proximal esophageal stricture.  Esophageal biopsy negative for CMV or malignancy. In-house ENT recommends transfer to tertiary center for repair. On-call ENT at the [facility], [name] recommended strict n.p.o., antibiotics, good oral hygiene and G-tube feeding.  Transfer not necessary unless patient decompensates -Appreciate help by SLP, pulmonology, ENT and GI. -Change antibiotic to IV Unasyn. -Added chlorhexidine mouth rinse -IR placed G-tube on 6/2.  Bolus tube feed started on 6/3.  Rheumatoid arthritis with pulmonary involvement On methotrexate, prednisone, CellCept and Plaquenil at home. -On IV Solu-Medrol here. -Per pulm, continue prednisone at 40mg  via PEG to be tapered by 5mg  per week. Continue Cellcept 500 BID, plaquenil 200 (patient is asked to dc this if he has any recurrence of previously noted visual disturbances), continue Humira Alden as previous. Will follow up in clinic w/in 7d post DC.   Elevated liver enzymes Recent Labs  Lab 09/10/21 0532 09/11/21 0511 09/12/21 0331  AST 68*  --  59*  ALT 85*  --  79*  ALKPHOS 45  --  45  BILITOT 0.5  --  0.5  PROT 6.6  --  6.6  ALBUMIN 2.6* 2.7* 2.5*  Unclear etiology of this.  He has no GI symptoms.  Continue  monitoring  Dysphagia/odynophagia Due to posterior pharyngeal perforation and proximal esophageal stricture. -Strict n.p.o. per ENT.  G-tube placed on 6/2.  Tube feed started on 6/3.  Severe protein calorie malnutrition/unintentional weight loss Body mass index is 18.56 kg/m. Nutrition Problem: Severe Malnutrition Etiology: chronic illness (RA, rheumatoid lung disease) Signs/Symptoms: severe fat depletion, mild muscle depletion, moderate muscle depletion, percent weight loss Percent weight loss: 19 % Interventions: Prostat, Tube feeding  Uncontrolled NIDDM-2 with hyperglycemia A1c 11.9%.  No history of diabetes.  Likely due to chronic steroid use. Recent Labs  Lab 09/12/21 0340 09/12/21 0417 09/12/21 0755 09/12/21 1216 09/12/21 1339  GLUCAP 106* 83 110* 179* 173*  -SSI to every 4 hours. -Check C-peptide -Consult diabetic coordinator -Needs insulin on discharge.   Hypokalemia and hypomagnesemia Hypomagnesemia resolved. -KCl 40x2 per tube      DVT prophylaxis:  enoxaparin (LOVENOX) injection 40 mg Start: 09/01/21 2200  Code Status: Full code Family Communication: Updated significant other at bedside. Level of care: Med-Surg.   Status is: Inpatient Remains inpatient appropriate because: Posterior pharyngeal perforation/possible retropharyngeal abscess   Final disposition: Likely home on 6/4 Consultants:  Pulmonology ENT in house and at [facility] Interventional radiology  Sch Meds:  Scheduled Meds:  Adalimumab  40 mg Subcutaneous Q14 Days   chlorhexidine  15 mL Mouth/Throat BID   diclofenac Sodium  2 g Topical  QID   enoxaparin (LOVENOX) injection  40 mg Subcutaneous Q24H   feeding supplement (OSMOLITE 1.5 CAL)  474 mL Per Tube QID   And   free water  70 mL Per Tube Q6H   feeding supplement (PROSource TF)  45 mL Per Tube BID   fluticasone furoate-vilanterol  1 puff Inhalation Daily   And   umeclidinium bromide  1 puff Inhalation Daily    insulin aspart  0-9 Units Subcutaneous Q4H   methylPREDNISolone (SOLU-MEDROL) injection  30 mg Intravenous Q24H   pantoprazole (PROTONIX) IV  40 mg Intravenous Q24H   pneumococcal 20-valent conjugate vaccine  0.5 mL Intramuscular Tomorrow-1000   potassium chloride  40 mEq Per Tube Once   Vitamin D (Ergocalciferol)  50,000 Units Oral Weekly   Continuous Infusions:  sodium chloride Stopped (09/11/21 1600)   ampicillin-sulbactam (UNASYN) IV 3 g (09/12/21 1256)   PRN Meds:.sodium chloride, acetaminophen **OR** acetaminophen, HYDROmorphone (DILAUDID) injection, ipratropium-albuterol, magic mouthwash w/lidocaine, ondansetron **OR** ondansetron (ZOFRAN) IV  Antimicrobials: Anti-infectives (From admission, onward)    Start     Dose/Rate Route Frequency Ordered Stop   09/11/21 0000  ceFAZolin (ANCEF) IVPB 2g/100 mL premix        2 g 200 mL/hr over 30 Minutes Intravenous To Radiology 09/10/21 1425 09/11/21 0700   09/09/21 1800  Ampicillin-Sulbactam (UNASYN) 3 g in sodium chloride 0.9 % 100 mL IVPB        3 g 200 mL/hr over 30 Minutes Intravenous Every 6 hours 09/09/21 1530     09/08/21 1000  levofloxacin (LEVAQUIN) tablet 750 mg  Status:  Discontinued        750 mg Per Tube Daily 09/08/21 0752 09/09/21 1530   09/02/21 2000  azithromycin (ZITHROMAX) 500 mg in sodium chloride 0.9 % 250 mL IVPB        500 mg 250 mL/hr over 60 Minutes Intravenous Every 24 hours 09/01/21 1929 09/05/21 2034   09/02/21 1330  micafungin (MYCAMINE) 150 mg in sodium chloride 0.9 % 100 mL IVPB  Status:  Discontinued        150 mg 107.5 mL/hr over 1 Hours Intravenous Every 24 hours 09/02/21 1215 09/04/21 1011   09/02/21 0900  sulfamethoxazole-trimethoprim (BACTRIM) 400-80 MG per tablet 1 tablet  Status:  Discontinued        1 tablet Oral Once per day on Mon Wed Fri 09/01/21 1958 09/03/21 0947   09/02/21 0701  ceFEPIme (MAXIPIME) 2 g in sodium chloride 0.9 % 100 mL IVPB  Status:  Discontinued        2 g 200 mL/hr over 30  Minutes Intravenous Every 12 hours 09/01/21 1933 09/01/21 2033   09/02/21 0700  vancomycin (VANCOREADY) IVPB 1250 mg/250 mL  Status:  Discontinued        1,250 mg 166.7 mL/hr over 90 Minutes Intravenous Every 12 hours 09/01/21 2034 09/02/21 1054   09/02/21 0400  ceFEPIme (MAXIPIME) 2 g in sodium chloride 0.9 % 100 mL IVPB        2 g 200 mL/hr over 30 Minutes Intravenous Every 8 hours 09/01/21 2033 09/06/21 2225   09/01/21 2200  hydroxychloroquine (PLAQUENIL) tablet 200 mg  Status:  Discontinued        200 mg Oral 2 times daily 09/01/21 1958 09/02/21 1241   09/01/21 1930  vancomycin (VANCOREADY) IVPB 1500 mg/300 mL        1,500 mg 150 mL/hr over 120 Minutes Intravenous  Once 09/01/21 1859 09/01/21 2206   09/01/21 1930  cefTRIAXone (ROCEPHIN)  2 g in sodium chloride 0.9 % 100 mL IVPB  Status:  Discontinued        2 g 200 mL/hr over 30 Minutes Intravenous Every 24 hours 09/01/21 1929 09/01/21 1929   09/01/21 1915  azithromycin (ZITHROMAX) 500 mg in sodium chloride 0.9 % 250 mL IVPB        500 mg 250 mL/hr over 60 Minutes Intravenous  Once 09/01/21 1909 09/01/21 2118   09/01/21 1900  ceFEPIme (MAXIPIME) 2 g in sodium chloride 0.9 % 100 mL IVPB        2 g 200 mL/hr over 30 Minutes Intravenous  Once 09/01/21 1859 09/01/21 1945        I have personally reviewed the following labs and images: CBC: Recent Labs  Lab 09/08/21 0513 09/09/21 0545 09/10/21 0532 09/11/21 0511 09/12/21 0331  WBC 4.8 4.3 5.6 5.1 5.3  NEUTROABS  --   --  3.4  --   --   HGB 12.3* 12.1* 12.0* 12.1* 12.3*  HCT 37.3* 35.4* 36.0* 36.5* 36.3*  MCV 91.9 91.2 92.8 91.5 91.0  PLT 272 283 283 314 308   BMP &GFR Recent Labs  Lab 09/06/21 0351 09/06/21 1704 09/07/21 0437 09/08/21 0513 09/09/21 0545 09/10/21 0532 09/11/21 0511 09/12/21 0331  NA  --   --    < > 136 131* 137 136 135  K 4.2  --    < > 4.5 3.9 4.8 4.2 3.4*  CL  --   --    < > 102 99 105 105 106  CO2  --   --    < > 27 26 28 28 23   GLUCOSE  --    --    < > 215* 292* 104* 108* 109*  BUN  --   --    < > 18 18 15 12 11   CREATININE  --   --    < > 0.70 0.57* 0.79 0.68 0.58*  CALCIUM  --   --    < > 8.9 8.6* 8.8* 8.7* 8.9  MG 1.9 1.9  --   --   --  1.6* 1.8 1.9  PHOS 3.2 3.0  --   --   --  4.1 3.4 3.4   < > = values in this interval not displayed.   Estimated Creatinine Clearance: 118.3 mL/min (A) (by C-G formula based on SCr of 0.58 mg/dL (L)). Liver & Pancreas: Recent Labs  Lab 09/10/21 0532 09/11/21 0511 09/12/21 0331  AST 68*  --  59*  ALT 85*  --  79*  ALKPHOS 45  --  45  BILITOT 0.5  --  0.5  PROT 6.6  --  6.6  ALBUMIN 2.6* 2.7* 2.5*   No results for input(s): LIPASE, AMYLASE in the last 168 hours. No results for input(s): AMMONIA in the last 168 hours. Diabetic: Recent Labs    09/10/21 0532  HGBA1C 11.9*   Recent Labs  Lab 09/12/21 0340 09/12/21 0417 09/12/21 0755 09/12/21 1216 09/12/21 1339  GLUCAP 106* 83 110* 179* 173*   Cardiac Enzymes: No results for input(s): CKTOTAL, CKMB, CKMBINDEX, TROPONINI in the last 168 hours. No results for input(s): PROBNP in the last 8760 hours. Coagulation Profile: Recent Labs  Lab 09/11/21 0511  INR 1.0   Thyroid Function Tests: No results for input(s): TSH, T4TOTAL, FREET4, T3FREE, THYROIDAB in the last 72 hours. Lipid Profile: No results for input(s): CHOL, HDL, LDLCALC, TRIG, CHOLHDL, LDLDIRECT in the last 72 hours. Anemia Panel: No  results for input(s): VITAMINB12, FOLATE, FERRITIN, TIBC, IRON, RETICCTPCT in the last 72 hours. Urine analysis: No results found for: COLORURINE, APPEARANCEUR, LABSPEC, PHURINE, GLUCOSEU, HGBUR, BILIRUBINUR, KETONESUR, PROTEINUR, UROBILINOGEN, NITRITE, LEUKOCYTESUR Sepsis Labs: Invalid input(s): PROCALCITONIN, [place]  Microbiology: No results found for this or any previous visit (from the past 240 hour(s)).   Radiology Studies: No results found.    Tommy Goostree T. [place]  If 7PM-7AM, please contact  night-coverage [email or web address] 09/12/2021, 2:24 PM

## 2021-09-13 LAB — MAGNESIUM: Magnesium: 1.7 mg/dL (ref 1.7–2.4)

## 2021-09-13 LAB — CBC
HCT: 37.8 % — ABNORMAL LOW (ref 39.0–52.0)
Hemoglobin: 12.8 g/dL — ABNORMAL LOW (ref 13.0–17.0)
MCH: 31.1 pg (ref 26.0–34.0)
MCHC: 33.9 g/dL (ref 30.0–36.0)
MCV: 91.7 fL (ref 80.0–100.0)
Platelets: 316 10*3/uL (ref 150–400)
RBC: 4.12 MIL/uL — ABNORMAL LOW (ref 4.22–5.81)
RDW: 12.9 % (ref 11.5–15.5)
WBC: 5 10*3/uL (ref 4.0–10.5)
nRBC: 0 % (ref 0.0–0.2)

## 2021-09-13 LAB — GLUCOSE, CAPILLARY
Glucose-Capillary: 117 mg/dL — ABNORMAL HIGH (ref 70–99)
Glucose-Capillary: 126 mg/dL — ABNORMAL HIGH (ref 70–99)
Glucose-Capillary: 133 mg/dL — ABNORMAL HIGH (ref 70–99)
Glucose-Capillary: 144 mg/dL — ABNORMAL HIGH (ref 70–99)
Glucose-Capillary: 193 mg/dL — ABNORMAL HIGH (ref 70–99)
Glucose-Capillary: 256 mg/dL — ABNORMAL HIGH (ref 70–99)
Glucose-Capillary: 85 mg/dL (ref 70–99)

## 2021-09-13 LAB — RENAL FUNCTION PANEL
Albumin: 2.6 g/dL — ABNORMAL LOW (ref 3.5–5.0)
Anion gap: 5 (ref 5–15)
BUN: 14 mg/dL (ref 6–20)
CO2: 24 mmol/L (ref 22–32)
Calcium: 8.9 mg/dL (ref 8.9–10.3)
Chloride: 107 mmol/L (ref 98–111)
Creatinine, Ser: 0.53 mg/dL — ABNORMAL LOW (ref 0.61–1.24)
GFR, Estimated: >60 mL/min (ref >60)
Glucose, Bld: 93 mg/dL (ref 70–99)
Phosphorus: 3.4 mg/dL (ref 2.5–4.6)
Potassium: 3.9 mmol/L (ref 3.5–5.1)
Sodium: 136 mmol/L (ref 135–145)

## 2021-09-13 MED ORDER — FREE WATER: 140.0000 mL | Freq: Four times a day (QID) | 1 refills | Status: DC

## 2021-09-13 MED ORDER — OSMOLITE 1.5 CAL PO LIQD: 474.0000 mL | Freq: Four times a day (QID) | ORAL | 1 refills | Status: DC

## 2021-09-13 MED ORDER — INSULIN ASPART 100 UNIT/ML FLEXPEN: 1.0000 [IU] | PEN_INJECTOR | Freq: Four times a day (QID) | SUBCUTANEOUS | 11 refills | Status: DC

## 2021-09-13 MED ORDER — BLOOD GLUCOSE METER KIT: PACK | 0 refills | Status: DC

## 2021-09-13 MED ORDER — SULFAMETHOXAZOLE-TRIMETHOPRIM 400-80 MG PO TABS: 1.0000 | ORAL_TABLET | ORAL | Status: DC

## 2021-09-13 MED ORDER — PREDNISONE 10 MG PO TABS: ORAL_TABLET | ORAL | 0 refills | Status: DC

## 2021-09-13 MED ORDER — FOLIC ACID 1 MG PO TABS: 1.0000 mg | ORAL_TABLET | Freq: Every day | ORAL | 1 refills | Status: DC

## 2021-09-13 MED ORDER — INSULIN ASPART 100 UNIT/ML IJ SOLN: 0.0000 [IU] | Freq: Four times a day (QID) | INTRAMUSCULAR | 11 refills | Status: DC

## 2021-09-13 MED ORDER — OMEPRAZOLE 2 MG/ML ORAL SUSPENSION: 40.0000 mg | Freq: Every day | ORAL | 1 refills | Status: DC

## 2021-09-13 MED ORDER — ADALIMUMAB 40 MG/0.4ML ~~LOC~~ AJKT: 40.0000 mg | AUTO-INJECTOR | SUBCUTANEOUS | Status: DC

## 2021-09-13 MED ORDER — INSULIN ASPART 100 UNIT/ML IJ SOLN
0.0000 [IU] | Freq: Four times a day (QID) | INTRAMUSCULAR | Status: DC
  Administered 2021-09-13: 3 [IU] via SUBCUTANEOUS
  Administered 2021-09-13: 2 [IU] via SUBCUTANEOUS
  Administered 2021-09-14: 3 [IU] via SUBCUTANEOUS
  Filled 2021-09-13 (×3): qty 1

## 2021-09-13 MED ORDER — ALBUTEROL SULFATE HFA 108 (90 BASE) MCG/ACT IN AERS: 2.0000 | INHALATION_SPRAY | Freq: Four times a day (QID) | RESPIRATORY_TRACT | 2 refills | Status: AC | PRN

## 2021-09-13 MED ORDER — INSULIN STARTER KIT- PEN NEEDLES (ENGLISH)
1.0000 | Freq: Once | Status: AC
Start: 2021-09-13 — End: 2021-09-13
  Administered 2021-09-13: 1
  Filled 2021-09-13: qty 1

## 2021-09-13 MED ORDER — LIVING WELL WITH DIABETES BOOK
Freq: Once | Status: AC
Start: 2021-09-13 — End: 2021-09-13
  Filled 2021-09-13: qty 1

## 2021-09-13 MED ORDER — OXYCODONE HCL 5 MG PO TABS: 5.0000 mg | ORAL_TABLET | Freq: Four times a day (QID) | ORAL | 0 refills | Status: DC | PRN

## 2021-09-13 MED ORDER — MYCOPHENOLATE MOFETIL 500 MG PO TABS: 500.0000 mg | ORAL_TABLET | Freq: Two times a day (BID) | ORAL | Status: DC

## 2021-09-13 MED ORDER — "INSULIN SYRINGE 31G X 5/16"" 0.5 ML MISC": 1 refills | Status: DC

## 2021-09-13 MED ORDER — "PEN NEEDLES 3/16"" 31G X 5 MM MISC": 1.0000 "pen " | Freq: Four times a day (QID) | 1 refills | Status: DC

## 2021-09-13 MED ORDER — AMOXICILLIN-POT CLAVULANATE 875-125 MG PO TABS: 1.0000 | ORAL_TABLET | Freq: Two times a day (BID) | ORAL | 0 refills | Status: DC

## 2021-09-13 MED ORDER — VITAMIN D 12.5 MCG/0.25ML PO LIQD
1.0000 mL | Freq: Every day | ORAL | 1 refills | Status: DC
Start: 2021-09-13 — End: 2024-02-20

## 2021-09-13 MED ORDER — CHLORHEXIDINE GLUCONATE 0.12 % MT SOLN: 15.0000 mL | Freq: Two times a day (BID) | OROMUCOSAL | 0 refills | Status: AC

## 2021-09-13 MED ORDER — HYDROXYCHLOROQUINE SULFATE 200 MG PO TABS: 200.0000 mg | ORAL_TABLET | Freq: Two times a day (BID) | ORAL | Status: AC

## 2021-09-13 NOTE — TOC Transition Note (Addendum)
Transition of Care [facility]) - CM/SW Discharge Note   Patient Details  Name: Sean [name] MRN: [id number] Date of Birth: July 25, 1975  [facility] of [facility]) CM/SW Contact:  Bing Quarry, RN Phone Number: 09/13/2021, 10:39 AM   Clinical Narrative: 6/4: Patient has discharge orders for today. Gastrostomy tube placed 09/11/21 in IR. Unit RN asking about how patient is to get Tube Feedings.  Paged on call RD x2 listed in Amnion.  Called Pharmacy and they said to send floor stock home with patient till they can get it filled via Rx and local pharmacy.  Last RD note  on 09/11/21, indicates re-evaluation/consult and recommendations updated.   Diabetic Coordinator last note on 09/08/21, indicated consult  update with recommendations.  SLP consult completed.  Patient education documented for home tube feedings on 6/3 given by RN with significant other present.  Follow up appointments and discharge instructions by provider present on AVS.   Spoke with patient. Will be supplied with 4 cans of floor stock. Patient has a rider and can stop by pharmacy on the way home but will call ahead to make sure it is in stock and to let them now he will be needing it routinely for now. Gabriel Cirri RN CM  Gabriel Cirri RN CM  .   UPDATE: DC canceled till Monday 09/13/21. Gabriel Cirri RN CM        Patient Goals and CMS Choice        Discharge Placement                       Discharge Plan and Services                                     Social Determinants of Health (SDOH) Interventions     Readmission Risk Interventions     View : No data to display.

## 2021-09-13 NOTE — Progress Notes (Signed)
Patient able to use insulin pen demonstration better than he was able to use traditional vial and syringe. Due to patient's arthritis, he had greater difficulty being able to draw up the correct amount of units from vial. After practicing with pen as well, patient feels much more comfortable with being able to use this time of insulin administration.

## 2021-09-13 NOTE — Progress Notes (Signed)
Inpatient Diabetes Program Recommendations  AACE/ADA: New Consensus Statement on Inpatient Glycemic Control (2015)  Target Ranges:  Prepandial:   less than 140 mg/dL      Peak postprandial:   less than 180 mg/dL (1-2 hours)      Critically ill patients:  140 - 180 mg/dL   Lab Results  Component Value Date   GLUCAP 256 (H) 09/13/2021   HGBA1C 11.9 (H) 09/10/2021    Review of Glycemic Control  Inpatient Diabetes Program Recommendations:   RN Earlyne Iba teaching pt. Insulin administration by syringe and pen to determine easiest for patient to use. DM coordinator working remotely on weekend call.  Thank you, Nani Gasser. Flavius Repsher, RN, MSN, CDE  Diabetes Coordinator Inpatient Glycemic Control Team Team Pager [phone] (8am-5pm) 09/13/2021 1:19 PM

## 2021-09-13 NOTE — Progress Notes (Signed)
PULMONOLOGY         Date: 09/13/2021,   MRN# [id number] Sean [name] Mar 09, 1976     AdmissionWeight: 72.6 kg                 CurrentWeight: 73.6 kg  Referring provider: Dr. Dareen PianoAnderson   CHIEF COMPLAINT:   Forceful cough with pneumomediastinum   HISTORY OF PRESENT ILLNESS   This is a 46 pleasant patient with a history of severe rheumatoid arthritis, seen previously while hospitalized due to bilateral multifocal pneumonia with treatment for community-acquired pneumonia and resolution.  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  Patient has been progressively dyspneic with reduction in spirometry performed in clinic via PFT.  Additional evaluation with rheumatology status post initiation of Humira infusions as well as CellCept and chronic prednisone use at higher dose starting 60 mg with slow taper.  On previous admission he also had moderate pleural effusion with resolution postthoracentesis and clinical improvement patient develops worsening symptoms when tapering below 45 mg daily including scaling of skin fingers and skin redness and erythema around nasolabial folds and worsening dyspnea and stiffness.  He came in on this admission with chest discomfort and severe dysphagia with cough.  He was seen in clinic and noted to have severe oral thrush status post ear nose and throat evaluation with course of Diflucan and partial resolution.  On examination there is still erythema in the upper airway and lower airway as well as remnants of thrush visible in the lower airway.  CT chest independently reviewed with findings of pneumomediastinum without esophageal perforation.  Overall interval improvement in lung parenchyma bilaterally.  Pulmonary critical care consultation placed for additional evaluation management   09/03/21-  patient seen at bedside. He has continued dysphagia worse  with pill swallowing. I have changed most of his PO meds to IV for now. Discussed medical plan with family and patient at bedside. Reviewed case with GI today there is plan for EGD. Reviewed CXR this am, mild b/l pleural effusions worse from prior but not clinicallyh significant, pneumonmediastinum is stable. Will continue to follow.   09/04/21 - patient seems improved.  He continues to have pain with swallowing. 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 There were biopsies done. We will obtain CT neck to eval cervical spine  09/07/21- patient clinically improved with less neck pain and less dysphagia. He had repeat CT neck with findings of fluid collection possible abscess.  Reviewed with Dr Dareen PianoAnderson and sent message to Dr Elenore RotaJuengel ENT for evaluation. He remains on cefepime.   09/08/21- Patient is s/p ENT evaluation with no findings to suggest abscess. Ive ordered repeat SLP to allow patient PO nourishement. He remains on levofloxacin and solumedrol for now and is clinically improved.   09/09/21- patient had eventful day. He is s/p SLP with findings of perforated hypopharynx and s/p ENT evaluation. He is to be NPO for now and possibly transfer to tertiary facility that may be able to offer head & neck surgery. He is reporting improvement in dysphagia and neck pain. 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   09/10/21- patient had eventful day. He has PEG tube planned for AM.   09/11/21- pt s/p PEG tube.  He had mild episodes of hemoptysis today states its just clotted material from throat. He is doing better with throat pain and dysphagia.  I have reduced  steroids today.  He had CXR which is stable reviewed by me.  He has abd pain post PEG.  He has not had BM.  He is not coughing and is stable on room air. He is ambulatory with belly discomfort. Remains NPO .   09/12/21- patient is stable on room air.  He is reporting further improvement in throat pain. He is in  process of using PEG. Reviewed medical plan with Dr Alanda Slim. RD for gastrostomy education today. Plan for dc in am. Will plan to continue prednisone at  via PEG to be tapered by  per week. Continue Cellcept 500 BID, plaquenil 200 (patient is asked to dc this if he has any recurrence of previously noted visual disturbances), continue Humira [place] as previous. Will follow up in clinic w/in 7d post DC.   09/13/21- Patient requires nutritional evaluation and help acquiring nourishment for PEG.  He is clnincally more stable and he is being optimized for DC.  I will sign off for now and am available if needed.   PAST MEDICAL HISTORY   Past Medical History:  Diagnosis Date   H/O immunosuppressive therapy    Hyperpigmentation of skin    Hyperpigmentation rash/vesicles of the palm and face   Neutropenia (HCC)    RA (rheumatoid arthritis) (HCC)      SURGICAL HISTORY   Past Surgical History:  Procedure Laterality Date   ESOPHAGOGASTRODUODENOSCOPY N/A 09/03/2021   Procedure: ESOPHAGOGASTRODUODENOSCOPY (EGD);  Surgeon: Regis Bill, MD;  Location: [facility];  Service: Endoscopy;  Laterality: N/A;   IR GASTROSTOMY TUBE MOD SED  09/11/2021     FAMILY HISTORY   Family History  Problem Relation Age of Onset   Hyperlipidemia Mother    Hypertension Father      SOCIAL HISTORY   Social History   Tobacco Use   Smoking status: Former    Types: Cigarettes   Smokeless tobacco: Never  Vaping Use   Vaping Use: Never used  Substance Use Topics   Alcohol use: Not Currently   Drug use: Not Currently    Types: Marijuana     MEDICATIONS    Home Medication:    Current Medication:  Current Facility-Administered Medications:    0.9 %  sodium chloride infusion, , Intravenous, PRN, Leeroy Bock, MD, Stopped at 09/11/21 1600   Adalimumab PNKT 40 mg, 40 mg, Subcutaneous, Q14 Days, Jamelle Rushing L, MD, 40 mg at 09/03/21 0912   Ampicillin-Sulbactam (UNASYN) 3 g in sodium  chloride 0.9 % 100 mL IVPB, 3 g, Intravenous, Q6H, Gonfa, Taye T, MD, Last Rate: 200 mL/hr at 09/13/21 0605, 3 g at 09/13/21 0605   chlorhexidine (PERIDEX) 0.12 % solution 15 mL, 15 mL, Mouth/Throat, BID, Gonfa, Taye T, MD, 15 mL at 09/13/21 0900   diclofenac Sodium (VOLTAREN) 1 % topical gel 2 g, 2 g, Topical, QID, Jamelle Rushing L, MD, 2 g at 09/12/21 2036   enoxaparin (LOVENOX) injection 40 mg, 40 mg, Subcutaneous, Q24H, Mansy, Jan A, MD, 40 mg at 09/08/21 2253   feeding supplement (OSMOLITE 1.5 CAL) liquid 474 mL, 474 mL, Per Tube, QID, Gonfa, Taye T, MD, 474 mL at 09/13/21 0923   fluticasone furoate-vilanterol (BREO ELLIPTA) 200-25 MCG/ACT 1 puff, 1 puff, Inhalation, Daily, 1 puff at 09/13/21 0923 **AND** umeclidinium bromide (INCRUSE ELLIPTA) 62.5 MCG/ACT 1 puff, 1 puff, Inhalation, Daily, Mansy, Jan A, MD, 1 puff at 09/13/21 0923   free water 140 mL, 140 mL, Per Tube, QID, Gonfa, Taye T, MD, 140 mL  at 09/13/21 0901   HYDROmorphone (DILAUDID) injection 0.5 mg, 0.5 mg, Intravenous, Q4H PRN, Leeroy Bock, MD, 0.5 mg at 09/13/21 0620   insulin aspart (novoLOG) injection 0-9 Units, 0-9 Units, Subcutaneous, Q4H, Gonfa, Taye T, MD, 1 Units at 09/13/21 0859   ipratropium-albuterol (DUONEB) 0.5-2.5 (3) MG/3ML nebulizer solution 3 mL, 3 mL, Nebulization, Q4H PRN, Mansy, Jan A, MD   methylPREDNISolone sodium succinate (SOLU-MEDROL) 40 mg/mL injection 30 mg, 30 mg, Intravenous, Q24H, Briannon Boggio, MD, 30 mg at 09/13/21 0901   [DISCONTINUED] ondansetron (ZOFRAN) tablet 4 mg, 4 mg, Oral, Q6H PRN **OR** ondansetron (ZOFRAN) injection 4 mg, 4 mg, Intravenous, Q6H PRN, Mansy, Jan A, MD   pantoprazole (PROTONIX) injection 40 mg, 40 mg, Intravenous, Q24H, Allannah Kempen, MD, 40 mg at 09/13/21 2620   pneumococcal 20-valent conjugate vaccine (PREVNAR 20) injection 0.5 mL, 0.5 mL, Intramuscular, Tomorrow-1000, Mansy, Vernetta Honey, MD   Vitamin D (Ergocalciferol) (DRISDOL) capsule 50,000 Units, 50,000 Units,  Oral, Weekly, Mansy, Vernetta Honey, MD, 50,000 Units at 09/09/21 0902    ALLERGIES   Patient has no known allergies.     REVIEW OF SYSTEMS    Review of Systems:  Gen:  Denies  fever, sweats, chills weigh loss  HEENT: Denies blurred vision, double vision, ear pain, eye pain, hearing loss, nose bleeds, sore throat Cardiac:  No dizziness, chest pain or heaviness, chest tightness,edema Resp:   reports dyspnea chronically  Gi: Denies swallowing difficulty, stomach pain, nausea or vomiting, diarrhea, constipation, bowel incontinence Gu:  Denies bladder incontinence, burning urine Ext:   Denies Joint pain, stiffness or swelling Skin: Denies  skin rash, easy bruising or bleeding or hives Endoc:  Denies polyuria, polydipsia , polyphagia or weight change Psych:   Denies depression, insomnia or hallucinations   Other:  All other systems negative   VS: BP 103/68 (BP Location: Left Arm)   Pulse 80   Temp 97.9 F (36.6 C)   Resp 18   Ht 6\' 5"  (1.956 m)   Wt 73.6 kg   SpO2 97%   BMI 19.24 kg/m      PHYSICAL EXAM    GENERAL:NAD, no fevers, chills, no weakness no fatigue HEAD: Normocephalic, atraumatic.  EYES: Pupils equal, round, reactive to light. Extraocular muscles intact. No scleral icterus.  MOUTH: Moist mucosal membrane. Dentition intact. No abscess noted.  EAR, NOSE, THROAT: Clear without exudates. No external lesions.  NECK: Supple. No thyromegaly. No nodules. No JVD.  PULMONARY: decreased breath sounds with mild rhonchi worse at bases bilaterally.  CARDIOVASCULAR: S1 and S2. Regular rate and rhythm. No murmurs, rubs, or gallops. No edema. Pedal pulses 2+ bilaterally.  GASTROINTESTINAL: Soft, nontender, nondistended. No masses. Positive bowel sounds. No hepatosplenomegaly.  MUSCULOSKELETAL: No swelling, clubbing, or edema. Range of motion full in all extremities.  NEUROLOGIC: Cranial nerves II through XII are intact. No gross focal neurological deficits. Sensation intact.  Reflexes intact.  SKIN: No ulceration, lesions, rashes, or cyanosis. Skin warm and dry. Turgor intact.  PSYCHIATRIC: Mood, affect within normal limits. The patient is awake, alert and oriented x 3. Insight, judgment intact.       IMAGING        ASSESSMENT/PLAN   Subacute chest discomfort and severe dysphagia Secondary to forceful cough likely due to recurrent candidiasis with thrush -Failure of Diflucan p.o. and outpatient status post ENT evaluation -s/p micafungin IV for now -Supportive care for pneumomediastinum including reduction of shearing forces, avoid incentive spirometry, BiPAP/CPAP, flutter valve -Agree with empiric cefepime and Zithromax  narrowed to levofloxacin -GI consultation-s/p EGD -continue nasal canula O2 for supportive care to allow diffusion of nitrogen rich gas out of soft tissue   Severe dysphagia   - s/p EGD - severe narrowing of distal esophagus s/p biopsies  - CT cervical spine today -speech and swallow evaluation- nPO -repeat barium SLP - perforated hypopharynx -for PEG tube  Recurrent bilateral pleural effusion Previous thoracentesis with fluid pattern showing mildly ascitic eosinophilic predominant exudate consistent with rheumatoid associated effusions -Have DC'd IVF due to likely contributing to redevelopment of pleural effusion -Clinically patient is not dehydrated   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   -Reviewed repeat CXR today  Thank you for allowing me to participate in the care of this patient.   Patient/Family are satisfied with care plan and all questions have been answered.    Provider disclosure: Patient with at least one acute or chronic illness or injury that  poses a threat to life or bodily function and is being managed actively during this encounter.  All of the below services have been performed independently by signing provider:  review of prior documentation from internal and or external health records.  Review of previous and current lab results.  Interview and comprehensive assessment during patient visit today. Review of current and previous chest radiographs/CT scans. Discussion of management and test interpretation with health care team and patient/family.   This document was prepared using Dragon voice recognition software and may include unintentional dictation errors.     Vida Rigger, M.D.  Division of Pulmonary & Critical Care Medicine

## 2021-09-13 NOTE — Progress Notes (Signed)
PROGRESS NOTE  Sean [name] BJY:[id number] DOB: 1975/10/07   PCP: [name], MD  Patient is from: Home.  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  CTA chest on 5/23 negative for PE but acute pneumomediastinum with extension into the lower neck and bilateral lower lobe volume loss/possible consolidation concerning for pneumonia or rheumatoid lung disease.  CT chest with oral contrast on 5/23 confirmed pneumomediastinum and multiple focal and confluent infiltrates scattered throughout the lungs but no contrast extravasation. patient was started on broad-spectrum antibiotics with cefepime and Zithromax.  Pulmonology, GI and ENT consulted.  CT cervical spine without contrast.  CT cervical spine without contrast on 5/26 concerning for gas in the prevertebral space, carotid space, deep cervical space on the right likely an extension from known pneumomediastinum.  Patient had fluoroscopy guided Dobbhoff tube placed on 5/27.  CT soft tissue neck with contrast on 5/29 showed regression of upper pneumomediastinum and soft tissue emphysema but organized retropharyngeal gas or fluid concerning for developing abscess.  Imaging reviewed by me ENT who did not feel there is significant abscess for surgical drain.  NG tube removed on 5/31.  He had a barium swallow study concerning for posterior pharyngeal perforation.  ENT recommended transfer to tertiary center for repair.  Called [facility], and talked to Dr. [name] and ENT on call.   After he reviewed patient's imaging, he recommended strict n.p.o., good oral hygiene with chlorhexidine mouth rinse, good antibiotic coverage and G-tube for feeding to allow the posterior pharyngeal perforation heal on its own. He feels stitching or trying to repair the perforation would cause more tissue damage.  Dr. [name] did not feel transfer to [name] is necessary.  However, he suggested calling back if patient is worse.    IR placed G-tube on 6/2.  Bolus tube feeding started on 6/3.  Discharged home on 6/5.    Subjective: Seen and examined earlier this morning.  No major events overnight of this morning.  No complaints.  Objective: Vitals:   09/12/21 1952 09/13/21 0436 09/13/21 0500 09/13/21 0815  BP: 109/73 103/66  103/68  Pulse: 95 79  80  Resp: Temp: 98 F (36.7 C) 98.4 F (36.9 C)  97.9 F (36.6 C)  TempSrc: Oral Oral    SpO2: 99% 98%  97%  Weight:   73.6 kg   Height:        Examination:  GENERAL: No apparent distress.  Nontoxic. HEENT: MMM.  Vision and hearing grossly intact.  NECK: Full ROM.  Right anterior cervical LAD RESP:  No IWOB.  Fair aeration bilaterally. CVS:  RRR. Heart sounds normal.  ABD/GI/GU: BS+. Abd soft, NTND.  G-tube in place MSK/EXT:  Moves extremities. No apparent deformity. No edema.  SKIN: no apparent skin lesion or wound NEURO: Awake and alert. Oriented appropriately.  No apparent focal neuro deficit. PSYCH: Calm. Normal affect.   Procedures:  5/25-EGD 6/2-G-tube placed by IR.  Microbiology summarized: MRSA PCR screen nonreactive.  Assessment and Plan: * Multifocal pneumonia CT chest concerning for multifocal pneumonia.  He also have history of rheumatoid arthritis with pulmonary  involvement. He is on multiple immunosuppressants for rheumatoid arthritis.  He is also at risk for aspiration pneumonia due to dysphagia, dysphagia and recent candidal infection.  Surprisingly, he has not had any fever.  Tmax was 100.1 on arrival.   He has no leukocytosis either.  He is on room air. -Vanco, cefepime and Zithromax 5/23-5/27>> Levaquin 5/30-5/31. -On IV Unasyn mainly for posterior pharyngeal perforation  Concern for retropharyngeal abscess/posterior pharyngeal perforation Initially CT soft tissue neck on 5/29 raises concern for developing retropharyngeal abscess.  Imaging reviewed by ENT, who didn't appreciate an abscess that warrants this surgical intervention.  Barium swallow study on 5/31 raises concern for posterior pharyngeal perforation per radiology and SLP.  EGD on 5/25 normal except for proximal esophageal stricture.  Esophageal biopsy negative for CMV or malignancy. In-house ENT recommends transfer to tertiary center for repair. On-call ENT at the [name], Dr. [name] recommended strict n.p.o., antibiotics, good oral hygiene and G-tube feeding.  Transfer not necessary unless patient decompensates -Appreciate help by SLP, pulmonology, ENT and GI. -Continue IV Unasyn.  Discharged on Augmentin -Added chlorhexidine mouth rinse -IR placed G-tube on 6/2.  Bolus tube feed started on 6/3.  Rheumatoid arthritis with pulmonary involvement On methotrexate, prednisone, CellCept and Plaquenil at home. -On IV Solu-Medrol here. -Per pulm, continue prednisone at 40mg  via PEG to be tapered by 5mg  per week. Continue Cellcept 500 BID, plaquenil 200 (patient is asked to dc this if he has any recurrence of previously noted visual disturbances), continue Humira Latty as previous. Will follow up in clinic w/in 7d post DC.   Elevated liver enzymes Recent Labs  Lab 09/10/21 0532 09/11/21 0511 09/12/21 0331 09/13/21 0502  AST 68*  --  59*  --   ALT 85*  --  79*  --   ALKPHOS 45  --  45  --   BILITOT 0.5  --  0.5  --   PROT 6.6  --  6.6  --   ALBUMIN 2.6* 2.7* 2.5* 2.6*  Unclear etiology of this.  He has no GI symptoms.  Continue monitoring  Dysphagia/odynophagia Due to posterior pharyngeal perforation and proximal esophageal  stricture. -Strict n.p.o. per ENT.  G-tube placed on 6/2.  Tube feed started on 6/3.  Severe protein calorie malnutrition/unintentional weight loss Body mass index is 18.56 kg/m. Nutrition Problem: Severe Malnutrition Etiology: chronic illness (RA, rheumatoid lung disease) Signs/Symptoms: severe fat depletion, mild muscle depletion, moderate muscle depletion, percent weight loss Percent weight loss: 19 % Interventions: Prostat, Tube feeding  Uncontrolled NIDDM-2 with hyperglycemia A1c 11.9%.  No history of diabetes.  Likely due to chronic steroid use. Recent Labs  Lab 09/12/21 1952 09/13/21 0022 09/13/21 0437 09/13/21 0817 09/13/21 1131  GLUCAP 171* 117* 85 133* 256*  -Change SSI to moderate every 6 hours. -Check C-peptide -Consult diabetic coordinator -Needs insulin on discharge.   Hypokalemia and hypomagnesemia Hypomagnesemia resolved. -KCl 40x2 per tube      DVT prophylaxis:  enoxaparin (LOVENOX) injection 40 mg Start: 09/01/21 2200  Code Status: Full code Family Communication: Updated significant other at bedside. Level of care: Med-Surg.   Status is: Inpatient Remains inpatient appropriate because: Posterior pharyngeal perforation/possible retropharyngeal abscess   Final disposition: Likely home on 6/5. Consultants:  Pulmonology ENT in house and at [facility] Interventional radiology  Sch Meds:  Scheduled Meds:  Adalimumab  40 mg Subcutaneous Q14 Days   chlorhexidine  15 mL Mouth/Throat BID   diclofenac Sodium  2 g Topical QID   enoxaparin (LOVENOX) injection  40 mg Subcutaneous Q24H   feeding supplement (OSMOLITE 1.5 CAL)  474 mL Per Tube QID   fluticasone furoate-vilanterol  1 puff Inhalation Daily   And   umeclidinium bromide  1 puff Inhalation Daily   free water  140 mL Per Tube QID   insulin aspart  0-15 Units Subcutaneous Q6H   methylPREDNISolone (SOLU-MEDROL) injection  30 mg Intravenous Q24H   pantoprazole (PROTONIX) IV  40 mg  Intravenous Q24H   pneumococcal 20-valent conjugate vaccine  0.5 mL Intramuscular Tomorrow-1000   Vitamin D (Ergocalciferol)  50,000 Units Oral Weekly   Continuous Infusions:  sodium chloride Stopped (09/11/21 1600)   ampicillin-sulbactam (UNASYN) IV 3 g (09/13/21 1258)   PRN Meds:.sodium chloride, HYDROmorphone (DILAUDID) injection, ipratropium-albuterol, [DISCONTINUED] ondansetron **OR** ondansetron (ZOFRAN) IV  Antimicrobials: Anti-infectives (From admission, onward)    Start     Dose/Rate Route Frequency Ordered Stop   09/14/21 0000  sulfamethoxazole-trimethoprim (BACTRIM) 400-80 MG tablet        1 tablet Per Tube 3 times weekly 09/13/21 0901     09/13/21 0000  hydroxychloroquine (PLAQUENIL) 200 MG tablet        200 mg Per Tube 2 times daily 09/13/21 0901     09/13/21 0000  amoxicillin-clavulanate (AUGMENTIN) 875-125 MG tablet        1 tablet Per Tube 2 times daily 09/13/21 0901 09/23/21 2359   09/11/21 0000  ceFAZolin (ANCEF) IVPB 2g/100 mL premix        2 g 200 mL/hr over 30 Minutes Intravenous To Radiology 09/10/21 1425 09/11/21 0700   09/09/21 1800  Ampicillin-Sulbactam (UNASYN) 3 g in sodium chloride 0.9 % 100 mL IVPB        3 g 200 mL/hr over 30 Minutes Intravenous Every 6 hours 09/09/21 1530     09/08/21 1000  levofloxacin (LEVAQUIN) tablet 750 mg  Status:  Discontinued        750 mg Per Tube Daily 09/08/21 0752 09/09/21 1530   09/02/21 2000  azithromycin (ZITHROMAX) 500 mg in sodium chloride 0.9 % 250 mL IVPB        500 mg 250 mL/hr over 60 Minutes Intravenous Every 24 hours 09/01/21 1929 09/05/21 2034   09/02/21 1330  micafungin (MYCAMINE) 150 mg in sodium chloride 0.9 % 100 mL IVPB  Status:  Discontinued        150 mg 107.5 mL/hr over 1 Hours Intravenous Every 24 hours 09/02/21 1215 09/04/21 1011   09/02/21 0900  sulfamethoxazole-trimethoprim (BACTRIM) 400-80 MG per tablet 1 tablet  Status:  Discontinued        1 tablet Oral Once per day on Mon Wed Fri 09/01/21 1958  09/03/21 0947   09/02/21 0701  ceFEPIme (MAXIPIME) 2 g in sodium chloride 0.9 % 100 mL IVPB  Status:  Discontinued        2 g 200 mL/hr over 30 Minutes Intravenous Every 12 hours 09/01/21 1933 09/01/21 2033   09/02/21 0700  vancomycin (VANCOREADY) IVPB 1250 mg/250 mL  Status:  Discontinued        1,250 mg 166.7 mL/hr over 90 Minutes Intravenous Every 12 hours 09/01/21 2034 09/02/21 1054   09/02/21 0400  ceFEPIme (MAXIPIME) 2 g in sodium chloride 0.9 % 100 mL IVPB        2 g 200 mL/hr over 30 Minutes Intravenous Every 8 hours 09/01/21 2033 09/06/21 2225   09/01/21 2200  hydroxychloroquine (PLAQUENIL) tablet 200 mg  Status:  Discontinued        200 mg  Oral 2 times daily 09/01/21 1958 09/02/21 1241   09/01/21 1930  vancomycin (VANCOREADY) IVPB 1500 mg/300 mL        1,500 mg 150 mL/hr over 120 Minutes Intravenous  Once 09/01/21 1859 09/01/21 2206   09/01/21 1930  cefTRIAXone (ROCEPHIN) 2 g in sodium chloride 0.9 % 100 mL IVPB  Status:  Discontinued        2 g 200 mL/hr over 30 Minutes Intravenous Every 24 hours 09/01/21 1929 09/01/21 1929   09/01/21 1915  azithromycin (ZITHROMAX) 500 mg in sodium chloride 0.9 % 250 mL IVPB        500 mg 250 mL/hr over 60 Minutes Intravenous  Once 09/01/21 1909 09/01/21 2118   09/01/21 1900  ceFEPIme (MAXIPIME) 2 g in sodium chloride 0.9 % 100 mL IVPB        2 g 200 mL/hr over 30 Minutes Intravenous  Once 09/01/21 1859 09/01/21 1945        I have personally reviewed the following labs and images: CBC: Recent Labs  Lab 09/09/21 0545 09/10/21 0532 09/11/21 0511 09/12/21 0331 09/13/21 0502  WBC 4.3 5.6 5.1 5.3 5.0  NEUTROABS  --  3.4  --   --   --   HGB 12.1* 12.0* 12.1* 12.3* 12.8*  HCT 35.4* 36.0* 36.5* 36.3* 37.8*  MCV 91.2 92.8 91.5 91.0 91.7  PLT 283 283 314 308 316   BMP &GFR Recent Labs  Lab 09/06/21 1704 09/07/21 0437 09/09/21 0545 09/10/21 0532 09/11/21 0511 09/12/21 0331 09/13/21 0502  NA  --    < > 131* 137 136 135 136  K   --    < > 3.9 4.8 4.2 3.4* 3.9  CL  --    < > 99 105 105 106 107  CO2  --    < > GLUCOSE  --    < > 292* 104* 108* 109* 93  BUN  --    < > CREATININE  --    < > 0.57* 0.79 0.68 0.58* 0.53*  CALCIUM  --    < > 8.6* 8.8* 8.7* 8.9 8.9  MG 1.9  --   --  1.6* 1.8 1.9 1.7  PHOS 3.0  --   --  4.1 3.4 3.4 3.4   < > = values in this interval not displayed.   Estimated Creatinine Clearance: 122.7 mL/min (A) (by C-G formula based on SCr of 0.53 mg/dL (L)). Liver & Pancreas: Recent Labs  Lab 09/10/21 0532 09/11/21 0511 09/12/21 0331 09/13/21 0502  AST 68*  --  59*  --   ALT 85*  --  79*  --   ALKPHOS 45  --  45  --   BILITOT 0.5  --  0.5  --   PROT 6.6  --  6.6  --   ALBUMIN 2.6* 2.7* 2.5* 2.6*   No results for input(s): LIPASE, AMYLASE in the last 168 hours. No results for input(s): AMMONIA in the last 168 hours. Diabetic: No results for input(s): HGBA1C in the last 72 hours.  Recent Labs  Lab 09/12/21 1952 09/13/21 0022 09/13/21 0437 09/13/21 0817 09/13/21 1131  GLUCAP 171* 117* 85 133* 256*   Cardiac Enzymes: No results for input(s): CKTOTAL, CKMB, CKMBINDEX, TROPONINI in the last 168 hours. No results for input(s): PROBNP in the last 8760 hours. Coagulation Profile: Recent Labs  Lab 09/11/21 0511  INR 1.0   Thyroid Function Tests:  No results for input(s): TSH, T4TOTAL, FREET4, T3FREE, THYROIDAB in the last 72 hours. Lipid Profile: No results for input(s): CHOL, HDL, LDLCALC, TRIG, CHOLHDL, LDLDIRECT in the last 72 hours. Anemia Panel: No results for input(s): VITAMINB12, FOLATE, FERRITIN, TIBC, IRON, RETICCTPCT in the last 72 hours. Urine analysis: No results found for: COLORURINE, APPEARANCEUR, LABSPEC, PHURINE, GLUCOSEU, HGBUR, BILIRUBINUR, KETONESUR, PROTEINUR, UROBILINOGEN, NITRITE, LEUKOCYTESUR Sepsis Labs: Invalid input(s): PROCALCITONIN, LACTICIDVEN  Microbiology: No results found for this or any previous visit (from the past  240 hour(s)).   Radiology Studies: No results found.    Merit Maybee T. Doshia Dalia [facility]  If 7PM-7AM, please contact night-coverage [email or web address] 09/13/2021, 2:33 PM

## 2021-09-14 LAB — GLUCOSE, CAPILLARY
Glucose-Capillary: 82 mg/dL (ref 70–99)
Glucose-Capillary: 111 mg/dL — ABNORMAL HIGH (ref 70–99)
Glucose-Capillary: 241 mg/dL — ABNORMAL HIGH (ref 70–99)

## 2021-09-14 LAB — C-PEPTIDE: C-Peptide: 2 ng/mL (ref 1.1–4.4)

## 2021-09-14 MED ORDER — "PEN NEEDLES 3/16"" 31G X 5 MM MISC": 1.0000 "pen " | Freq: Four times a day (QID) | 1 refills | Status: DC

## 2021-09-14 MED ORDER — OSMOLITE 1.5 CAL PO LIQD: 474.0000 mL | Freq: Four times a day (QID) | ORAL | 1 refills | Status: DC

## 2021-09-14 MED ORDER — LIVING WELL WITH DIABETES BOOK
Freq: Once | Status: AC
Start: 2021-09-14 — End: 2021-09-14
  Filled 2021-09-14: qty 1

## 2021-09-14 MED ORDER — INSULIN ASPART 100 UNIT/ML FLEXPEN: 1.0000 [IU] | PEN_INJECTOR | Freq: Four times a day (QID) | SUBCUTANEOUS | 11 refills | Status: DC

## 2021-09-14 MED ORDER — OSMOLITE 1.5 CAL PO LIQD: 474.0000 mL | Freq: Four times a day (QID) | ORAL | 1 refills | Status: AC

## 2021-09-14 MED ORDER — FREE WATER: 140.0000 mL | Freq: Four times a day (QID) | 1 refills | Status: AC

## 2021-09-14 MED ORDER — OXYCODONE HCL 5 MG PO TABS: 5.0000 mg | ORAL_TABLET | Freq: Four times a day (QID) | ORAL | 0 refills | Status: AC | PRN

## 2021-09-14 MED ORDER — PREDNISONE 10 MG PO TABS: ORAL_TABLET | ORAL | 0 refills | Status: DC

## 2021-09-14 MED ORDER — AMOXICILLIN-POT CLAVULANATE 875-125 MG PO TABS: 1.0000 | ORAL_TABLET | Freq: Two times a day (BID) | ORAL | 0 refills | Status: AC

## 2021-09-14 MED ORDER — BLOOD GLUCOSE MONITOR KIT: PACK | 0 refills | Status: DC

## 2021-09-14 MED ORDER — INSULIN STARTER KIT- PEN NEEDLES (ENGLISH)
1.0000 | Freq: Once | Status: AC
  Administered 2021-09-14: 1
  Filled 2021-09-14: qty 1

## 2021-09-14 NOTE — TOC Initial Note (Signed)
Transition of Care [facility]) - Initial/Assessment Note    Patient Details  Name: Sean [name] MRN: [id number] Date of Birth: 26-Jun-1975  Transition of Care [facility]) CM/SW Contact:    Sean Section, RN Phone Number: 09/14/2021, 11:14 AM  Clinical Narrative:    Patient lives at home, but will discharge to parents' residence for assistance with care post hospitalization.  Patient has no concerns about transportation or medications at this time.  Sean [name] aware of patient need for tube feedings, which are delivered via syringe.  Patient has received teaching and is quickly adapting to his tube feedings per care team.  Sean [name] from [facility] notified, able to accept patient.  Patient and family have not further concerns at this time.               Expected Discharge Plan: Home w Home Health Services Barriers to Discharge: Continued Medical Work up   Patient Goals and CMS Choice     Choice offered to / list presented to : NA  Expected Discharge Plan and Services Expected Discharge Plan: Home w Home Health Services In-house Referral: Nutrition Discharge Planning Services: CM Consult Post Acute Care Choice: Durable Medical Equipment, Home Health (Tube feedings) Living arrangements for the past 2 months: Single Family Home Expected Discharge Date: 09/14/21               DME Arranged: Sean [name] feeding DME Agency:  [facility]) Date DME Agency Contacted: 09/14/21     HH Arranged: RN HH Agency: [facility] Date HH Agency Contacted: 09/14/21 Time HH Agency Contacted: 1114 Representative spoke with at [facility] Agency: Sean [name]  Prior Living Arrangements/Services Living arrangements for the past 2 months: Single Family Home Lives with:: Self (patient will discharge to parent's home to stay for assistance with care) Patient language and need for interpreter reviewed:: Yes Do you feel safe going back to the place where you live?: Yes      Need for  Family Participation in Patient Care: Yes (Comment) Care giver support system in place?: Yes (comment)   Criminal Activity/Legal Involvement Pertinent to Current Situation/Hospitalization: No - Comment as needed  Activities of Daily Living Home Assistive Devices/Equipment: None ADL Screening (condition at time of admission) Patient's cognitive ability adequate to safely complete daily activities?: Yes Is the patient deaf or have difficulty hearing?: No Does the patient have difficulty seeing, even when wearing glasses/contacts?: No Does the patient have difficulty concentrating, remembering, or making decisions?: No Patient able to express need for assistance with ADLs?: Yes Does the patient have difficulty dressing or bathing?: Yes Independently performs ADLs?: Yes (appropriate for developmental age) Does the patient have difficulty walking or climbing stairs?: No Weakness of Legs: Both Weakness of Arms/Hands: Both  Permission Sought/Granted Permission sought to share information with : Case Manager Permission granted to share information with : Yes, Verbal Permission Granted     Permission granted to share info w AGENCY: [facility]        Emotional Assessment Appearance:: Appears stated age Attitude/Demeanor/Rapport: Gracious, Engaged Affect (typically observed): Pleasant, Appropriate Orientation: : Oriented to Self, Oriented to Place, Oriented to  Time, Oriented to Situation Alcohol / Substance Use: Not Applicable Psych Involvement: No (comment)  Admission diagnosis:  Bilateral pneumonia [J18.9] Patient Active Problem List   Diagnosis Date Noted   Hypokalemia and hypomagnesemia 09/12/2021   Hypokalemia 09/12/2021   Hypomagnesemia 09/10/2021   Elevated liver enzymes 09/10/2021   Severe protein calorie malnutrition/unintentional weight loss 09/09/2021  Dysphagia/odynophagia 09/09/2021   Unintentional weight loss 09/09/2021   Concern for retropharyngeal  abscess/posterior pharyngeal perforation    Uncontrolled NIDDM-2 with hyperglycemia 09/02/2021   Chest pain    Multifocal pneumonia 09/01/2021   Pleural effusion 07/27/2021   Elevated LFTs 07/27/2021   Rheumatoid arthritis with pulmonary involvement 03/26/2021   PCP:  Sean Mustache, MD Pharmacy:   [facility] [phone] [place], [place] - [address] AT [facility] [address] [place] [address] Phone: [phone] Fax: [phone]  [facility] - [place], [place] - [address] [place] [address] Phone: [phone] Fax: [phone]     Social Determinants of Health (SDOH) Interventions    Readmission Risk Interventions    09/14/2021   11:11 AM  Readmission Risk Prevention Plan  Transportation Screening Complete  PCP or Specialist Appt within 5-7 Days Complete  Home Care Screening Complete  Medication Review (RN CM) Complete

## 2021-09-14 NOTE — Progress Notes (Addendum)
Inpatient Diabetes Program Recommendations  AACE/ADA: New Consensus Statement on Inpatient Glycemic Control   Target Ranges:  Prepandial:   less than 140 mg/dL      Peak postprandial:   less than 180 mg/dL (1-2 hours)      Critically ill patients:  140 - 180 mg/dL    Latest Reference Range & Units 09/13/21 08:17 09/13/21 11:31 09/13/21 15:32 09/13/21 17:23 09/13/21 20:13 09/14/21 04:29 09/14/21 07:24  Glucose-Capillary 70 - 99 mg/dL 133 (H) 256 (H) 144 (H) 193 (H) 126 (H) 82 111 (H)    Latest Reference Range & Units 09/10/21 05:32  Hemoglobin A1C 4.8 - 5.6 % 11.9 (H)   Review of Glycemic Control  Diabetes history: NO Outpatient Diabetes medications: NA Current orders for Inpatient glycemic control: Novolog 0-15 units Q6H; Osmolite 474 ml QID; Solumedrol 30 mg Q24H  Inpatient Diabetes Program Recommendations:    HbgA1C: A1C 11.9% on 09/10/21 indicating an average glucose of 295 mg/dl over the past 2-3 months. Per note on 09/13/21 by Dr. Cyndia Skeeters, A1C 11.9%; no DM hx; likely due to chronic steroid use. At time of discharge, please provide Rx for: glucose monitoring kit (#[id number]), insulin pens, and insulin pen needles (#[id number]).  Addendum 09/14/21_0 :00-Spoke with patient about new diabetes diagnosis.  Patient reports that he sees his PCP every few months and that he last had blood work done 2-3 months ago and he has not been told anything about glucose being elevated. Patient has been on steroids since November (Prednisone 60 mg daily) which is tries to taper down but has to increase the dose when he needs to due to rheumatoid arthritis. Discussed A1C results (11.9% on 09/10/21) and explained what an A1C is and informed patient that his current A1C indicates an average glucose of 295 mg/dl over the past 2-3 months. Discussed basic pathophysiology of DM Type 2, basic home care, importance of checking CBGs and maintaining good CBG control to prevent long-term and short-term complications. Reviewed glucose  and A1C goals. Explained that since he is not taking anything by mouth that it would be recommended not to keep tight glucose control due to risk of hypoglycemia. Reviewed signs and symptoms of hyperglycemia and hypoglycemia along with treatment for both. Patient confirms that he has had symptoms of hyperglycemia prior to admission.  Discussed impact of nutrition, exercise, stress, sickness, and medications on diabetes control. Discussed impact of steroids on glucose control. Informed patient that a Living Well with diabetes booklet has been ordered and encouraged patient to read through entire book once received. Informed patient that he may be discharged on insulin. Discussed current Novolog correction scale and how to use correction scale. Asked patient to check his glucose as directed by discharging provider. Explained how his doctor can use glucose values to continue to make adjustments with DM medications if needed. Explained that if he is able to get off steroids, glucose may improve significantly and he may not need medications for DM control.  Reviewed and demonstrated how to draw up and administer insulin with vial and syringe and how to use an insulin pen.  Patient states he would prefer to use insulin pens.  Informed patient that an insulin starter kit was ordered. Reviewed all steps of insulin pen including attachment of needle, 2-unit air shot, dialing up dose, giving injection, removing needle, disposal of sharps, storage of unused insulin, disposal of insulin etc. Patient able to provide successful return demonstration.  Patient states he has given himself several insulin injections over the past  24 hours and he feels comfortable with insulin injections. Informed patient that RN will continue to ask him to self-administer insulin to ensure proper technique and ability to administer self insulin shots. Encouraged patient to call PCP and get follow up within a week so they can review glucose trends and  make adjustments if needed.  Patient verbalized understanding of information discussed and he states that he has no further questions at this time related to diabetes.   RNs to provide ongoing basic DM education at bedside with this patient and engage patient to actively check blood glucose and administer insulin injections.   Thanks, Barnie Alderman, RN, MSN, [facility] Diabetes Coordinator Inpatient Diabetes Program [phone] (Team Pager from 8am to [place])

## 2021-09-14 NOTE — Progress Notes (Signed)
Nutrition Follow-up  DOCUMENTATION CODES:   Severe malnutrition in context of chronic illness  INTERVENTION:   -Continue bolus TF via g-tube:   2 cartons (474 ml) Osmolite 1.5 via g-tube 4 times daily   70 ml free water flush before and after each feeding administration   Tube feeding regimen provides 2840 kcal (100% of needs), 119 grams of protein, and 1448 ml of H2O. 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  Ongoing  GOAL:   Patient will meet greater than or equal to 90% of their needs  Met with TF  MONITOR:   Diet advancement, TF tolerance  REASON FOR ASSESSMENT:   Malnutrition Screening Tool    ASSESSMENT:   Pt admitted with chest pain d/t bilateral PNA. 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 Pt will require supply of home TF formula. 3 day supply delivered to pt room.   Spoke with pt and family at bedside. Pt feel comfortable administering TF and denies any abdominal pain, nausea, or vomiting. He is eager to go home.   Per RNCM and Carolynn Sayers from [place], home health services have been set up.   Medications reviewed and include solu-medrol and vitamin D.   Labs reviewed: CBGS: 82-256 (inpatient orders for glycemic control are 0-15 units insulin aspart every 6 hours).    Diet Order:   Diet Order             Diet - low sodium heart healthy           Diet Carb Modified            Diet NPO time specified  Diet effective now                   EDUCATION NEEDS:   Education needs have been addressed  Skin:  Skin Assessment: Reviewed RN Assessment  Last BM:  09/12/21  Height:   Ht Readings from Last 1 Encounters:  09/03/21 _0  (1.956 m)    Weight:   Wt Readings from Last 1 Encounters:  09/14/21 63.5 kg   BMI:  Body mass index is 16.6 kg/m.  Estimated Nutritional Needs:   Kcal:  1610-9604  Protein:  115-130 grams  Fluid:  > 2 L    Loistine Chance, RD, LDN, [place] Registered Dietitian II Certified Diabetes Care and Education Specialist Please refer to [employer] for RD and/or RD on-call/weekend/after hours pager

## 2021-09-14 NOTE — Progress Notes (Signed)
PULMONOLOGY         Date: 09/14/2021,   MRN# [id number] Sean [name] [birth year]     AdmissionWeight: 72.6 kg                 CurrentWeight: 63.5 kg  Referring provider: Dr. Dareen Piano   CHIEF COMPLAINT:   Forceful cough with pneumomediastinum   HISTORY OF PRESENT ILLNESS   This is a 46 pleasant patient with a history of severe rheumatoid arthritis, seen previously while hospitalized due to bilateral multifocal pneumonia with treatment for community-acquired pneumonia and resolution.  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  Patient has been progressively dyspneic with reduction in spirometry performed in clinic via PFT.  Additional evaluation with rheumatology status post initiation of Humira infusions as well as CellCept and chronic prednisone use at higher dose starting 60 mg with slow taper.  On previous admission he also had moderate pleural effusion with resolution postthoracentesis and clinical improvement patient develops worsening symptoms when tapering below 45 mg daily including scaling of skin fingers and skin redness and erythema around nasolabial folds and worsening dyspnea and stiffness.  He came in on this admission with chest discomfort and severe dysphagia with cough.  He was seen in clinic and noted to have severe oral thrush status post ear nose and throat evaluation with course of Diflucan and partial resolution.  On examination there is still erythema in the upper airway and lower airway as well as remnants of thrush visible in the lower airway.  CT chest independently reviewed with findings of pneumomediastinum without esophageal perforation.  Overall interval improvement in lung parenchyma bilaterally.  Pulmonary critical care consultation placed for additional evaluation management   09/03/21-  patient seen at bedside. He has continued dysphagia worse  with pill swallowing. I have changed most of his PO meds to IV for now. Discussed medical plan with family and patient at bedside. Reviewed case with GI today there is plan for EGD. Reviewed CXR this am, mild b/l pleural effusions worse from prior but not clinicallyh significant, pneumonmediastinum is stable. Will continue to follow.   09/04/21 - patient seems improved.  He continues to have pain with swallowing. 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 There were biopsies done. We will obtain CT neck to eval cervical spine  09/07/21- patient clinically improved with less neck pain and less dysphagia. He had repeat CT neck with findings of fluid collection possible abscess.  Reviewed with Dr Dareen Piano and sent message to Dr Elenore Rota ENT for evaluation. He remains on cefepime.    09/14/21- patient is doing well. He is now with chronic velcro crepitations only without new changes and is on room air feeling well.  He had Osmolite per PEG x2 today and had no vomiting or aspiration/cough.  I have plans to see him in clnic on outptient for follow up  PAST MEDICAL HISTORY   Past Medical History:  Diagnosis Date   H/O immunosuppressive therapy    Hyperpigmentation of skin    Hyperpigmentation rash/vesicles of the palm and face   Neutropenia (HCC)    RA (rheumatoid arthritis) (HCC)      SURGICAL HISTORY   Past Surgical History:  Procedure Laterality Date   ESOPHAGOGASTRODUODENOSCOPY N/A 09/03/2021   Procedure: ESOPHAGOGASTRODUODENOSCOPY (EGD);  Surgeon: Regis Bill, MD;  Location: [facility] ENDOSCOPY;  Service: Endoscopy;  Laterality: N/A;   IR GASTROSTOMY TUBE MOD SED  09/11/2021  FAMILY HISTORY   Family History  Problem Relation Age of Onset   Hyperlipidemia Mother    Hypertension Father      SOCIAL HISTORY   Social History   Tobacco Use   Smoking status: Former    Types: Cigarettes   Smokeless tobacco: Never  Vaping Use   Vaping Use: Never used  Substance Use Topics    Alcohol use: Not Currently   Drug use: Not Currently    Types: Marijuana     MEDICATIONS    Home Medication:    Current Medication:  Current Facility-Administered Medications:    0.9 %  sodium chloride infusion, , Intravenous, PRN, Leeroy Bock, MD, Stopped at 09/11/21 1600   Adalimumab PNKT 40 mg, 40 mg, Subcutaneous, Q14 Days, Jamelle Rushing L, MD, 40 mg at 09/03/21 0912   Ampicillin-Sulbactam (UNASYN) 3 g in sodium chloride 0.9 % 100 mL IVPB, 3 g, Intravenous, Q6H, Gonfa, Taye T, MD, Last Rate: 200 mL/hr at 09/14/21 0604, 3 g at 09/14/21 0604   chlorhexidine (PERIDEX) 0.12 % solution 15 mL, 15 mL, Mouth/Throat, BID, Gonfa, Taye T, MD, 15 mL at 09/14/21 0956   diclofenac Sodium (VOLTAREN) 1 % topical gel 2 g, 2 g, Topical, QID, Jamelle Rushing L, MD, 2 g at 09/14/21 1006   enoxaparin (LOVENOX) injection 40 mg, 40 mg, Subcutaneous, Q24H, Mansy, Jan A, MD, 40 mg at 09/08/21 2253   feeding supplement (OSMOLITE 1.5 CAL) liquid 474 mL, 474 mL, Per Tube, QID, Gonfa, Taye T, MD, 474 mL at 09/14/21 0956   fluticasone furoate-vilanterol (BREO ELLIPTA) 200-25 MCG/ACT 1 puff, 1 puff, Inhalation, Daily, 1 puff at 09/14/21 1006 **AND** umeclidinium bromide (INCRUSE ELLIPTA) 62.5 MCG/ACT 1 puff, 1 puff, Inhalation, Daily, Mansy, Jan A, MD, 1 puff at 09/13/21 0923   free water 140 mL, 140 mL, Per Tube, QID, Gonfa, Taye T, MD, 140 mL at 09/14/21 0957   HYDROmorphone (DILAUDID) injection 0.5 mg, 0.5 mg, Intravenous, Q4H PRN, Jamelle Rushing L, MD, 0.5 mg at 09/14/21 1007   insulin aspart (novoLOG) injection 0-15 Units, 0-15 Units, Subcutaneous, Q6H, Gonfa, Taye T, MD, 2 Units at 09/13/21 2112   ipratropium-albuterol (DUONEB) 0.5-2.5 (3) MG/3ML nebulizer solution 3 mL, 3 mL, Nebulization, Q4H PRN, Mansy, Jan A, MD   methylPREDNISolone sodium succinate (SOLU-MEDROL) 40 mg/mL injection 30 mg, 30 mg, Intravenous, Q24H, Amiracle Neises, MD, 30 mg at 09/14/21 0956   [DISCONTINUED] ondansetron  (ZOFRAN) tablet 4 mg, 4 mg, Oral, Q6H PRN **OR** ondansetron (ZOFRAN) injection 4 mg, 4 mg, Intravenous, Q6H PRN, Mansy, Jan A, MD   pantoprazole (PROTONIX) injection 40 mg, 40 mg, Intravenous, Q24H, Kellina Dreese, MD, 40 mg at 09/14/21 0956   pneumococcal 20-valent conjugate vaccine (PREVNAR 20) injection 0.5 mL, 0.5 mL, Intramuscular, Tomorrow-1000, Mansy, Vernetta Honey, MD   Vitamin D (Ergocalciferol) (DRISDOL) capsule 50,000 Units, 50,000 Units, Oral, Weekly, Mansy, Vernetta Honey, MD, 50,000 Units at 09/09/21 0902    ALLERGIES   Patient has no known allergies.     REVIEW OF SYSTEMS    Review of Systems:  Gen:  Denies  fever, sweats, chills weigh loss  HEENT: Denies blurred vision, double vision, ear pain, eye pain, hearing loss, nose bleeds, sore throat Cardiac:  No dizziness, chest pain or heaviness, chest tightness,edema Resp:   reports dyspnea chronically  Gi: Denies swallowing difficulty, stomach pain, nausea or vomiting, diarrhea, constipation, bowel incontinence Gu:  Denies bladder incontinence, burning urine Ext:   Denies Joint pain, stiffness or swelling Skin: Denies  skin rash, easy bruising or bleeding or hives Endoc:  Denies polyuria, polydipsia , polyphagia or weight change Psych:   Denies depression, insomnia or hallucinations   Other:  All other systems negative   VS: BP 119/65 (BP Location: Right Arm)   Pulse 98   Temp 98 F (36.7 C)   Resp 16   Ht 6\' 5"  (1.956 m)   Wt 63.5 kg   SpO2 95%   BMI 16.60 kg/m      PHYSICAL EXAM    GENERAL:NAD, no fevers, chills, no weakness no fatigue HEAD: Normocephalic, atraumatic.  EYES: Pupils equal, round, reactive to light. Extraocular muscles intact. No scleral icterus.  MOUTH: Moist mucosal membrane. Dentition intact. No abscess noted.  EAR, NOSE, THROAT: Clear without exudates. No external lesions.  NECK: Supple. No thyromegaly. No nodules. No JVD.  PULMONARY: decreased breath sounds with mild rhonchi worse at bases  bilaterally.  CARDIOVASCULAR: S1 and S2. Regular rate and rhythm. No murmurs, rubs, or gallops. No edema. Pedal pulses 2+ bilaterally.  GASTROINTESTINAL: Soft, nontender, nondistended. No masses. Positive bowel sounds. No hepatosplenomegaly.  MUSCULOSKELETAL: No swelling, clubbing, or edema. Range of motion full in all extremities.  NEUROLOGIC: Cranial nerves II through XII are intact. No gross focal neurological deficits. Sensation intact. Reflexes intact.  SKIN: No ulceration, lesions, rashes, or cyanosis. Skin warm and dry. Turgor intact.  PSYCHIATRIC: Mood, affect within normal limits. The patient is awake, alert and oriented x 3. Insight, judgment intact.       IMAGING        ASSESSMENT/PLAN   Subacute chest discomfort and severe dysphagia Secondary to forceful cough likely due to recurrent candidiasis with thrush -Failure of Diflucan p.o. and outpatient status post ENT evaluation -s/p micafungin IV for now -Supportive care for pneumomediastinum including reduction of shearing forces, avoid incentive spirometry, BiPAP/CPAP, flutter valve -Agree with empiric cefepime and Zithromax narrowed to levofloxacin -GI consultation-s/p EGD -continue nasal canula O2 for supportive care to allow diffusion of nitrogen rich gas out of soft tissue   Severe dysphagia   - s/p EGD - severe narrowing of distal esophagus s/p biopsies  - CT cervical spine today -speech and swallow evaluation- nPO -repeat barium SLP - perforated hypopharynx -for PEG tube  Recurrent bilateral pleural effusion Previous thoracentesis with fluid pattern showing mildly ascitic eosinophilic predominant exudate consistent with rheumatoid associated effusions -Have DC'd IVF due to likely contributing to redevelopment of pleural effusion -Clinically patient is not dehydrated   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   -Reviewed repeat CXR today  Thank you for allowing me to participate in the care of this patient.   Patient/Family are satisfied with care plan and all questions have been answered.    Provider disclosure: Patient with at least one acute or chronic illness or injury that poses a threat to life or bodily function and is being managed actively during this encounter.  All of the below services have been performed independently by signing provider:  review of prior documentation from internal and or external health records.  Review of previous and current lab results.  Interview and comprehensive assessment during patient visit today. Review of current and previous chest radiographs/CT scans. Discussion of management and test interpretation with health care team and patient/family.  This document was prepared using Dragon voice recognition software and may include unintentional dictation errors.     Ottie Glazier, M.D.  Division of Pulmonary & Critical Care Medicine

## 2021-09-14 NOTE — Discharge Summary (Signed)
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  He denied any nausea or vomiting or abdominal pain.  No chest pain or palpitations.  No dysuria, oliguria or hematuria or flank pain.  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  CTA  chest on 5/23 negative for PE but acute pneumomediastinum with extension into the lower neck and bilateral lower lobe volume loss/possible consolidation concerning for pneumonia or rheumatoid lung disease.  CT chest with oral contrast on 5/23 confirmed pneumomediastinum and multiple focal and confluent infiltrates scattered throughout the lungs but no contrast extravasation. patient was started on broad-spectrum antibiotics with cefepime and Zithromax.  Pulmonology, GI and ENT consulted.   CT cervical spine without contrast on 5/26 concerning for gas in the prevertebral space, carotid space, deep cervical space on the right likely an extension from known pneumomediastinum.  Patient had fluoroscopy guided Dobbhoff tube placed on 5/27.  CT soft tissue neck with contrast on 5/29 showed regression of upper pneumomediastinum and soft tissue emphysema but organized retropharyngeal gas or fluid concerning for developing abscess.  Imaging reviewed by me ENT who did not feel there is significant abscess for surgical drain.  NG tube removed on 5/31.  He had a barium swallow study concerning for posterior pharyngeal perforation.  ENT recommended transfer to tertiary center for repair.   Called [facility], and talked to [name] and ENT on call.  After he reviewed patient's imaging, he recommended strict n.p.o., good oral hygiene with chlorhexidine mouth rinse, good antibiotic coverage and G-tube for feeding to allow the posterior pharyngeal perforation heal on its own. He feels stitching or trying to repair the perforation would cause more tissue damage.  [name] did not feel transfer to [name] is necessary.  However, he suggested calling back if patient is worse.     IR placed G-tube on 6/2.  Bolus tube feeding started on 6/3.   Discharge plan will be complete course of augmentin, follow up with [facility], follow up with pulmonology 1 week with prednisone taper,  and can eventually follow-up with [place] radiology  for g tube discontinuation. Patient received tube feed training  Procedures: Placement of percutaneous 65F balloon retention gastrostomy tube by IR 09/11/21  Consultations: Gen surg, IR, pulmonology, ENT  Discharge Exam: Vitals:   09/14/21 0430 09/14/21 0721  BP: 102/75 108/71  Pulse: 79 85  Resp: 17 16  Temp: 98.2 F (36.8 C) 98 F (36.7 C)  SpO2: 96% 98%    General: NAD Cardiovascular: RRR Respiratory: CTAB Abdomen: soft, non-tender, g-tube in place  Discharge Instructions   Discharge Instructions     Call MD for:  difficulty breathing, headache or visual disturbances   Complete by: As directed    Call MD for:  extreme fatigue   Complete by: As directed    Call MD for:  persistant dizziness or light-headedness   Complete by: As directed    Call MD for:  severe uncontrolled pain   Complete by: As directed    Call MD for:  temperature >100.4   Complete by: As directed    Diet - low sodium heart healthy   Complete by: As directed    Diet Carb Modified   Complete by: As directed    Discharge instructions   Complete by: As directed    It has been a pleasure taking care of you!  You were hospitalized due to pneumonia, mediastinal free air and perforation in your throat for which you have been treated with antibiotics.  We have started you on tube feed to allow your throat to heal.  We strongly recommend not swallowing anything by mouth.  We also recommend good oral hygiene with mouth rinse 2-3 times a day.  Please review your new medication list and the directions on your medications before you take them.  Follow-up with your pulmonologist in 1 week or sooner if needed.  In regards to diabetes, check your blood sugar 4 times a day before administering tube feed. Write down the numbers. Inject appropriate amount of insulin per sliding scale. Your goal blood glucose is between 80 and 150. Reach out to your primary care doctor if your blood glucose is persistently over  180.  Call [phone], to schedule a follow-up with one of [name]'s head and neck surgeons, either [name] or [name] per [name] recommendation.   No heavy lifting or exertion!     Take care,   Increase activity slowly   Complete by: As directed    Increase activity slowly   Complete by: As directed       Allergies as of 09/14/2021   No Known Allergies      Medication List     STOP taking these medications    Breztri Aerosphere 160-9-4.8 MCG/ACT Aero Generic drug: Budeson-Glycopyrrol-Formoterol   methotrexate 2.5 MG tablet   Vitamin D (Ergocalciferol) 1.25 MG (50000 UNIT) Caps capsule Commonly known as: DRISDOL       TAKE these medications    albuterol 108 (90 Base) MCG/ACT inhaler Commonly known as: VENTOLIN HFA Inhale 2 puffs into the lungs every 6 (six) hours as needed for wheezing or shortness of breath.   amoxicillin-clavulanate 875-125 MG tablet Commonly known as: AUGMENTIN Place 1 tablet into feeding tube 2 (two) times daily for 10 days.   blood glucose meter kit and supplies Dispense based on patient and insurance preference. Use up to four times daily as directed. (FOR ICD-10 E10.9, E11.9).   chlorhexidine 0.12 % solution Commonly known as: PERIDEX Use as directed 15 mLs  in the mouth or throat 2 (two) times daily.   feeding supplement (OSMOLITE 1.5 CAL) Liqd Place 474 mLs into feeding tube 4 (four) times daily.   folic acid 1 MG tablet Commonly known as: FOLVITE Place 1 tablet (1 mg total) into feeding tube daily. What changed: how to take this   free water Soln Place 140 mLs into feeding tube 4 (four) times daily.   hydroxychloroquine 200 MG tablet Commonly known as: PLAQUENIL Place 1 tablet (200 mg total) into feeding tube 2 (two) times daily. What changed: how to take this   insulin aspart 100 UNIT/ML FlexPen Commonly known as: NOVOLOG Inject 1-9 Units into the skin every 6 (six) hours. CBG 70 - 120: 0 units CBG 121 -  150: 1 unit CBG 151 - 200: 2 units CBG 201 - 250: 3 units CBG 251 - 300: 5 units CBG 301 - 350: 7 units CBG 351 - 400: 9 units CBG > 400: call MD and obtain STAT lab verification   mycophenolate 500 MG tablet Commonly known as: CELLCEPT Place 1 tablet (500 mg total) into feeding tube every 12 (twelve) hours. What changed: how to take this   omeprazole 2 mg/mL Susp oral suspension Commonly known as: FIRST-Omeprazole Place 20 mLs (40 mg total) into feeding tube daily.   oxyCODONE 5 MG immediate release tablet Commonly known as: Roxicodone Place 1 tablet (5 mg total) into feeding tube every 6 (six) hours as needed for up to 5 days for moderate pain or severe pain.   Pen Needles 3/16" 31G X 5 MM Misc 1 pen. by Does not apply route every 6 (six) hours.   predniSONE 10 MG tablet Commonly known as: DELTASONE Started with 40 mg daily and decrease dose by 5 mg every week What changed: additional instructions   sulfamethoxazole-trimethoprim 400-80 MG tablet Commonly known as: BACTRIM Place 1 tablet into feeding tube 3 (three) times a week. What changed: how to take this   Vitamin D 12.5 MCG/0.25ML Liqd Give 1 mL by tube daily.   witch hazel-glycerin pad Commonly known as: TUCKS Apply topically as needed for hemorrhoids.       No Known Allergies  Follow-up Information     Casilda Carls, MD. Schedule an appointment as soon as possible for a visit in 1 week(s).   Specialty: Internal Medicine Why: Office closed at this time patient to make own follow up appt Contact information: [place] [address] [phone]         Ottie Glazier, MD. Schedule an appointment as soon as possible for a visit in 1 week(s).   Specialty: Pulmonary Disease Why: Office closed at this time patient to make own follow up appt Contact information: [place] [address] [facility] Follow up.   Why: follow-up can be arranged  by calling [phone], and requesting follow-up with 1 of [name]'s head and neck surgeons, either [name] or [name].        Clyde Canterbury, MD Follow up.   Specialty: Otolaryngology Contact information: [place] [address] [phone]                  The results of significant diagnostics from this hospitalization (including imaging, microbiology, ancillary and laboratory) are listed below for reference.    Significant Diagnostic Studies: CT ABDOMEN WO CONTRAST  Result Date: 09/10/2021 CLINICAL DATA:  Posterior pharyngeal  perforation. Patient in need of percutaneous gastrostomy tube. Assess anatomy. EXAM: CT ABDOMEN WITHOUT CONTRAST TECHNIQUE: Multidetector CT imaging of the abdomen was performed following the standard protocol without IV contrast. RADIATION DOSE REDUCTION: This exam was performed according to the departmental dose-optimization program which includes automated exposure control, adjustment of the mA and/or kV according to patient size and/or use of iterative reconstruction technique. COMPARISON:  Recent PET-CT 08/20/2021 FINDINGS: Lower chest: Advanced fibrotic changes. The heart is within normal limits for size. No acute abnormality. Hepatobiliary: No focal liver abnormality is seen. No gallstones, gallbladder wall thickening, or biliary dilatation. Pancreas: Unremarkable. No pancreatic ductal dilatation or surrounding inflammatory changes. Spleen: Normal in size without focal abnormality. Adrenals/Urinary Tract: Adrenal glands are unremarkable. Kidneys are normal, without renal calculi, focal lesion, or hydronephrosis. Stomach/Bowel: Oral contrast material present throughout the colon. Low lying stomach. The gastric antrum is just above the umbilicus. No evidence of obstruction. Vascular/Lymphatic: Limited evaluation in the absence of intravenous contrast. No aneurysm or significant atherosclerotic plaque. Other: No abdominal  wall hernia or abnormality. Musculoskeletal: No acute or significant osseous findings. IMPRESSION: Low-lying stomach, the gastric antrum is just above the umbilicus. Anatomy would be suitable for percutaneous gastrostomy tube placement. Electronically Signed   By: Jacqulynn Cadet M.D.   On: 09/10/2021 09:12   DG Chest 2 View  Result Date: 09/01/2021 CLINICAL DATA:  Chest pain. EXAM: CHEST - 2 VIEW COMPARISON:  July 26, 2021.  Aug 20, 2021. FINDINGS: The heart size and mediastinal contours are within normal limits. Stable bibasilar atelectasis or infiltrates are noted with associated pleural effusions. The visualized skeletal structures are unremarkable. IMPRESSION: Stable bibasilar atelectasis or infiltrates are noted with associated pleural effusions. Electronically Signed   By: Marijo Conception M.D.   On: 09/01/2021 12:59   CT SOFT TISSUE NECK W CONTRAST  Result Date: 09/07/2021 CLINICAL DATA:  Soft tissue swelling with infection suspected. Severe dysplasia with cough. Recent treatment for oral thrush EXAM: CT NECK WITH CONTRAST TECHNIQUE: Multidetector CT imaging of the neck was performed using the standard protocol following the bolus administration of intravenous contrast. RADIATION DOSE REDUCTION: This exam was performed according to the departmental dose-optimization program which includes automated exposure control, adjustment of the mA and/or kV according to patient size and/or use of iterative reconstruction technique. CONTRAST:  15mL OMNIPAQUE IOHEXOL 300 MG/ML  SOLN COMPARISON:  Cervical spine CT from 3 days ago FINDINGS: Pharynx and larynx: Submucosal low-density thickening along the posterior hypopharynx and possibly at the aryepiglottic folds. There has been persistence of gas in the retropharyngeal space with clustered bubbles and fluid that shows some signs of peripheral ring is a shin with enhancing rim. The collection measures up to 8 mm anterior to posterior and 4.6 cm in transverse  span. The collection is seen from C1-T2 C5-6. Gas elsewhere in the neck has resolved. Salivary glands: No inflammation, mass, or stone. Thyroid: Normal. Lymph nodes: None enlarged or abnormal density. Vascular: No acute finding. Limited intracranial: Negative Visualized orbits: Negative Mastoids and visualized paranasal sinuses: Clear Skeleton: No primary spinal infection seen. Upper chest: Patchy nodular opacities in the upper lungs as seen by recent chest CT. IMPRESSION: Upper pneumomediastinum and soft tissue emphysema has regressed but retropharyngeal gas and fluid has organized, likely developing abscess. Electronically Signed   By: Jorje Guild M.D.   On: 09/07/2021 10:07   CT CHEST WO CONTRAST  Result Date: 09/01/2021 CLINICAL DATA:  Esophageal perforation with pneumo mediastinum. Right-sided chest pain for a few days.  EXAM: CT CHEST WITHOUT CONTRAST TECHNIQUE: Multidetector CT imaging of the chest was performed following the standard protocol without IV contrast. RADIATION DOSE REDUCTION: This exam was performed according to the departmental dose-optimization program which includes automated exposure control, adjustment of the mA and/or kV according to patient size and/or use of iterative reconstruction technique. COMPARISON:  CT angiography of the chest 09/01/2021 FINDINGS: Cardiovascular: Mild cardiac enlargement. No pericardial effusions. Normal caliber thoracic aorta. Mediastinum/Nodes: Extensive pneumomediastinum is again demonstrated throughout the mediastinum and extending into the base of the neck and supraclavicular regions. Contrast material fills the esophagus. The esophagus is patent to the stomach without any definite contrast extravasation. No significant lymphadenopathy. Lungs/Pleura: Emphysematous changes in the lungs. Coarse multifocal and confluent infiltrates scattered throughout the lungs with consolidation in the lung bases. This may represent multifocal pneumonia. Small bilateral  pleural effusions. Airways are patent. Upper Abdomen: No acute abnormalities demonstrated. Musculoskeletal: No chest wall mass or suspicious bone lesions identified. IMPRESSION: 1. Extensive pneumomediastinum as previously demonstrated. Contrast filled esophagus is patent and no extraluminal contrast extravasation is identified. 2. Multiple focal and confluent infiltrates are scattered throughout the lungs, suggesting multifocal pneumonia. Small bilateral pleural effusions. Electronically Signed   By: Lucienne Capers M.D.   On: 09/01/2021 18:31   CT Angio Chest PE W and/or Wo Contrast  Result Date: 09/01/2021 CLINICAL DATA:  Pulmonary embolism suspected. High probability. Right-sided chest pain. Rheumatoid arthritis. EXAM: CT ANGIOGRAPHY CHEST WITH CONTRAST TECHNIQUE: Multidetector CT imaging of the chest was performed using the standard protocol during bolus administration of intravenous contrast. Multiplanar CT image reconstructions and MIPs were obtained to evaluate the vascular anatomy. RADIATION DOSE REDUCTION: This exam was performed according to the departmental dose-optimization program which includes automated exposure control, adjustment of the mA and/or kV according to patient size and/or use of iterative reconstruction technique. CONTRAST:  30m OMNIPAQUE IOHEXOL 350 MG/ML SOLN COMPARISON:  Chest radiography same day. Prior CT angiography 07/25/2021. FINDINGS: Cardiovascular: Heart size upper limits of normal. No coronary artery calcification or aortic atherosclerotic calcification. Pulmonary arterial opacification is good. There are no pulmonary emboli. Mediastinum/Nodes: There is pneumomediastinum, extending into the lower neck. No evidence of mass. Few small mediastinal nodes as seen previously. Lungs/Pleura: Small pleural effusions layering dependently. Bilateral lower lobe atelectasis/pneumonia with areas of cystic change. This could be due to infectious pneumonia or rheumatoid lung disease.  There is no pneumothorax/pleural air. Upper Abdomen: Negative Musculoskeletal: No acute spinal finding. Review of the MIP images confirms the above findings. IMPRESSION: No pulmonary emboli. Acute pneumomediastinum, with extension into the lower neck. No pneumothorax/pleural air. Small effusions, less than were seen previously. Bilateral lower lobe volume loss/pneumonia with areas of cystic change. Findings could be due to infectious pneumonia and or rheumatoid lung disease. Electronically Signed   By: MNelson ChimesM.D.   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 EXAM: CT CERVICAL SPINE WITHOUT CONTRAST TECHNIQUE: Multidetector CT imaging of the cervical spine was performed without intravenous contrast. Multiplanar CT image reconstructions were also generated. RADIATION DOSE REDUCTION: This exam was performed according to the departmental dose-optimization program which includes automated exposure control, adjustment of the mA and/or kV according to patient size and/or use of iterative reconstruction technique. COMPARISON:  CT chest without contrast 09/01/2021 FINDINGS: Alignment: No significant listhesis is present. Slight reversal of the normal cervical lordosis noted. Skull base and vertebrae: Craniocervical junction is within normal limits. No acute or healing  fractures are present. No focal osseous lesions are present. Soft tissues and spinal canal: Gas is present in the prevertebral space, consistent with known pneumomediastinum. Some gas dissects into the carotid and deep cervical space on the right. No fluid collection present. No acute trauma. Spinal canal is within normal limits. Disc levels:  No significant focal stenosis. Upper chest: The lung apices are clear.  Pneumomediastinum noted. IMPRESSION: 1. Gas in the prevertebral space, carotid space, and deep cervical space on the right. This is consistent gas  dissecting upwards from the known pneumomediastinum. 2. No acute or healing fractures. 3. No significant focal stenosis. Electronically Signed   By: San Morelle M.D.   On: 09/04/2021 12:13   IR GASTROSTOMY TUBE MOD SED  Result Date: 09/11/2021 INDICATION: 46 year old male with history of esophageal perforation and retropharyngeal fluid collection requiring percutaneous enteric access. EXAM: PERC PLACEMENT GASTROSTOMY MEDICATIONS: The patient was receiving adequate antibiotic coverage administered in the appropriate time frame prior to this procedure as an inpatient. ANESTHESIA/SEDATION: Versed 2 mg IV; Fentanyl 100 mcg IV Moderate Sedation Time:  14 The patient was continuously monitored during the procedure by the interventional radiology nurse under my direct supervision. CONTRAST:  61m OMNIPAQUE IOHEXOL 350 MG/ML SOLN - administered into the gastric lumen. FLUOROSCOPY TIME:  Fluoroscopy Time: 1 minutes 36 seconds (3.8 mGy). COMPLICATIONS: None immediate. PROCEDURE: Informed written consent was obtained from the patient after a thorough discussion of the procedural risks, benefits and alternatives. All questions were addressed. Maximal Sterile barrier Technique was utilized including caps, mask, sterile gowns, sterile gloves, sterile drape, hand hygiene and skin antiseptic. A timeout was performed prior to the initiation of the procedure. The patient was placed on the procedure table in the supine position. Pre-procedure abdominal film confirmed visualization of the transverse colon. An angled 4-French catheter was passed through the nares into the stomach. The patient was prepped and draped in usual sterile fashion. The stomach was insufflated with air via the indwelling nasogastric tube. Under fluoroscopy, a puncture site was selected and local analgesia achieved with 1% lidocaine infiltrated subcutaneously. Under fluoroscopic guidance, a gastropexy needle was passed into the stomach and the T-bar  suture was released. Entry into the stomach was confirmed with fluoroscopy, aspiration of air, and injection of contrast material. This was repeated with an additional gastropexy suture (for a total of 2 fasteners). At the center of these gastropexy sutures, a dermatotomy was performed. An 18 gauge needle was passed into the stomach at the site of this dermatotomy, and position within the gastric lumen again confirmed under fluoroscopy using aspiration of air and contrast injection. An Amplatz guidewire was passed through this needle and intraluminal placement within the stomach was confirmed by fluoroscopy. The needle was removed. Over the guidewire, the percutaneous tract was dilated using a 10 mm non-compliant balloon. The balloon was deflated, then pushed into the gastric lumen followed in concert by the 20 Fr gastrostomy tube. The retention balloon of the percutaneous gastrostomy tube was inflated with 10 mL of sterile water. The tube was withdrawn until the retention balloon was at the edge of the gastric lumen. The external bumper was brought to the abdominal wall. Contrast was injected through the gastrostomy tube, confirming intraluminal positioning. The patient tolerated the procedure well without any immediate post-procedural complications. IMPRESSION: Technically successful placement of 20 Fr gastrostomy tube. DRuthann Cancer MD [facility] Electronically Signed   By: DRuthann CancerM.D.   On: 09/11/2021 11:37   NM  PET Image Initial (PI) Skull Base To Thigh (F-18 FDG)  Result Date: 08/23/2021 CLINICAL DATA:  Initial treatment strategy for failure to thrive. Bilateral pulmonary infiltrates. Unexplained weight loss. History of immunosuppressive therapy. EXAM: NUCLEAR MEDICINE PET SKULL BASE TO THIGH TECHNIQUE: 9.05 mCi F-18 FDG was injected intravenously. Full-ring PET imaging was performed from the skull base to thigh after the radiotracer. CT  data was obtained and used for attenuation correction and anatomic localization. Fasting blood glucose: 210 mg/dl COMPARISON:  Chest CT 07/25/2021 FINDINGS: Mediastinal blood pool activity: SUV max 1.51 Liver activity: SUV max NA NECK: No hypermetabolic lymph nodes in the neck. Incidental CT findings: none CHEST: No hypermetabolic mediastinal or hilar nodes. No suspicious pulmonary nodules on the CT scan. Incidental CT findings: Persistent left pleural effusion and bilateral pulmonary infiltrates. Stable prominent mediastinal and hilar lymph nodes likely reactive/hyperplastic. ABDOMEN/PELVIS: No abnormal hypermetabolic activity within the liver, pancreas, adrenal glands, or spleen. No hypermetabolic lymph nodes in the abdomen or pelvis. Incidental CT findings: none SKELETON: No focal hypermetabolic activity to suggest skeletal metastasis. Incidental CT findings: none IMPRESSION: Negative PET-CT for hypermetabolic neoplastic process. Persistent left pleural effusion, bilateral filtrates and reactive mediastinal and hilar adenopathy. Electronically Signed   By: Marijo Sanes M.D.   On: 08/23/2021 17:10   DG Chest Port 1 View  Result Date: 09/11/2021 CLINICAL DATA:  Pneumonia. EXAM: PORTABLE CHEST 1 VIEW COMPARISON:  Sep 06, 2021. FINDINGS: The heart size and mediastinal contours are within normal limits. Increased bibasilar atelectasis or infiltrates are noted with small associated pleural effusions. The visualized skeletal structures are unremarkable. IMPRESSION: Increased bibasilar atelectasis or infiltrates are noted with associated small pleural effusions. Electronically Signed   By: Marijo Conception M.D.   On: 09/11/2021 10:07   DG Chest Port 1 View  Result Date: 09/06/2021 CLINICAL DATA:  Follow-up pneumomediastinum. Rheumatoid lung disease. Aspiration pneumonia. EXAM: PORTABLE CHEST 1 VIEW COMPARISON:  09/03/2021 and chest CT dated 09/01/2021. FINDINGS: Small residual pneumomediastinum without significant  change. Mildly improved bibasilar airspace opacities and left basilar linear densities. Interval mild-to-moderate peribronchial thickening. Stable small bilateral pleural effusions. Feeding tube extending into the stomach. Unremarkable bones. IMPRESSION: 1. Stable small residual pneumomediastinum. 2. Interval mild to moderate bronchitic changes. 3. Mild improved bibasilar pneumonia/aspiration pneumonitis. 4. Stable small bilateral pleural effusions. Electronically Signed   By: Claudie Revering M.D.   On: 09/06/2021 14:54   DG Chest Port 1 View  Result Date: 09/03/2021 CLINICAL DATA:  Monitoring pneumomediastinum expansion as patient endorses increased pain/pressure EXAM: PORTABLE CHEST 1 VIEW COMPARISON:  Chest radiograph May 23, 23. FINDINGS: Similar bibasilar opacities. Similar pneumomediastinum with gas in the partially visualized lower neck. Similar cardiomediastinal silhouette. Small bilateral pleural effusions. No definite pneumothorax. IMPRESSION: Similar bibasilar opacities, pneumomediastinum, and small bilateral pleural effusions. Electronically Signed   By: Margaretha Sheffield M.D.   On: 09/03/2021 10:29   DG [facility] Dys Tube [employer]c W/Fl W/Rad  Result Date: 09/05/2021 INDICATION: RA with rheumatoid lung disease, aspiration pneumonia, pneumomediastinum without extravasation of esophageal contrast on CT chest 09/01/21. Request for image guided Dobhoff tube placement due to pharyngeal edema impairing UES function on MBSS yesterday with speech pathology. EXAM: NASO G TUBE PLACEMENT WITH FL AND WITH RAD COMPARISON:  CT chest w/o contrast 09/01/21 CONTRAST:  None FLUOROSCOPY TIME:  Radiation Exposure Index (as provided by the fluoroscopic device): 1.01 MGy Kerma COMPLICATIONS: None immediate PROCEDURE: The Dobhoff tube was lubricated with viscous lidocaine inserted into the right nostril. Under intermittent fluoroscopic guidance, the Dobhoff  tube was advanced to the stomach with tip terminating in the gastric antrum. A  spot fluoroscopic image was saved for documentation purposes. The tube was flushed easily with 10 cc 0.9% normal saline and affixed to the patient's nose with tape at 66 cm mark. The patient tolerated the procedure well without immediate postprocedural complication. IMPRESSION: Successful fluoroscopic guided placement of Dobhoff tube with tip terminating within the gastric antrum. The tube is ready for immediate use. Performed by Candiss Norse, PA-C and supervised by Kerby Moors, MD Electronically Signed   By: Kerby Moors M.D.   On: 09/05/2021 15:55    Microbiology: No results found for this or any previous visit (from the past 240 hour(s)).   Labs: Basic Metabolic Panel: Recent Labs  Lab 09/09/21 0545 09/10/21 0532 09/11/21 0511 09/12/21 0331 09/13/21 0502  NA 131* 137 136 135 136  K 3.9 4.8 4.2 3.4* 3.9  CL 99 105 105 106 107  CO2 _0 GLUCOSE 292* 104* 108* 109* 93  BUN _1 CREATININE 0.57* 0.79 0.68 0.58* 0.53*  CALCIUM 8.6* 8.8* 8.7* 8.9 8.9  MG  --  1.6* 1.8 1.9 1.7  PHOS  --  4.1 3.4 3.4 3.4   Liver Function Tests: Recent Labs  Lab 09/10/21 0532 09/11/21 0511 09/12/21 0331 09/13/21 0502  AST 68*  --  59*  --   ALT 85*  --  79*  --   ALKPHOS 45  --  45  --   BILITOT 0.5  --  0.5  --   PROT 6.6  --  6.6  --   ALBUMIN 2.6* 2.7* 2.5* 2.6*   No results for input(s): LIPASE, AMYLASE in the last 168 hours. No results for input(s): AMMONIA in the last 168 hours. CBC: Recent Labs  Lab 09/09/21 0545 09/10/21 0532 09/11/21 0511 09/12/21 0331 09/13/21 0502  WBC 4.3 5.6 5.1 5.3 5.0  NEUTROABS  --  3.4  --   --   --   HGB 12.1* 12.0* 12.1* 12.3* 12.8*  HCT 35.4* 36.0* 36.5* 36.3* 37.8*  MCV 91.2 92.8 91.5 91.0 91.7  PLT 283 283 314 308 316   Cardiac Enzymes: No results for input(s): CKTOTAL, CKMB, CKMBINDEX, TROPONINI in the last 168 hours. BNP: BNP (last 3 results) Recent Labs    09/01/21 1227  BNP 22.9    ProBNP (last 3  results) No results for input(s): PROBNP in the last 8760 hours.  CBG: Recent Labs  Lab 09/13/21 1532 09/13/21 1723 09/13/21 2013 09/14/21 0429 09/14/21 0724  GLUCAP 144* 193* 126* 82 111*       Signed:  Desma Maxim MD.  [facility] 09/14/2021, 9:43 AM

## 2021-10-14 ENCOUNTER — Other Ambulatory Visit: Payer: Self-pay | Admitting: Internal Medicine

## 2021-10-14 DIAGNOSIS — K9423 Gastrostomy malfunction: Secondary | ICD-10-CM

## 2021-10-14 DIAGNOSIS — Z4659 Encounter for fitting and adjustment of other gastrointestinal appliance and device: Secondary | ICD-10-CM

## 2021-10-19 ENCOUNTER — Ambulatory Visit
Admission: RE | Admit: 2021-10-19 | Discharge: 2021-10-19 | Disposition: A | Discharge status: Home / Self Care | Payer: 59 | Source: Ambulatory Visit | Attending: Internal Medicine | Admitting: Internal Medicine

## 2021-10-19 DIAGNOSIS — K9423 Gastrostomy malfunction: Secondary | ICD-10-CM | POA: Diagnosis present

## 2021-10-19 DIAGNOSIS — Z4659 Encounter for fitting and adjustment of other gastrointestinal appliance and device: Secondary | ICD-10-CM

## 2021-10-19 MED ORDER — IOHEXOL 300 MG/ML  SOLN
125.0000 mL | Freq: Once | INTRAMUSCULAR | Status: AC | PRN
  Administered 2021-10-19: 25 mL via ORAL

## 2021-10-20 ENCOUNTER — Ambulatory Visit: Payer: 59 | Admitting: Infectious Diseases

## 2021-10-22 ENCOUNTER — Ambulatory Visit: Payer: 59 | Admitting: Infectious Diseases

## 2021-10-22 ENCOUNTER — Encounter: Payer: Self-pay | Admitting: Infectious Diseases

## 2021-10-22 VITALS — Ht 77.0 in

## 2021-10-22 DIAGNOSIS — Z91199 Patient's noncompliance with other medical treatment and regimen due to unspecified reason: Secondary | ICD-10-CM

## 2021-11-30 ENCOUNTER — Inpatient Hospital Stay: Payer: 59 | Attending: Internal Medicine

## 2021-11-30 DIAGNOSIS — M069 Rheumatoid arthritis, unspecified: Secondary | ICD-10-CM | POA: Diagnosis present

## 2021-11-30 DIAGNOSIS — R5383 Other fatigue: Secondary | ICD-10-CM | POA: Diagnosis not present

## 2021-11-30 DIAGNOSIS — Z7952 Long term (current) use of systemic steroids: Secondary | ICD-10-CM | POA: Diagnosis not present

## 2021-11-30 DIAGNOSIS — Z79899 Other long term (current) drug therapy: Secondary | ICD-10-CM | POA: Diagnosis not present

## 2021-11-30 DIAGNOSIS — R0602 Shortness of breath: Secondary | ICD-10-CM | POA: Diagnosis not present

## 2021-11-30 DIAGNOSIS — Z87891 Personal history of nicotine dependence: Secondary | ICD-10-CM | POA: Diagnosis not present

## 2021-11-30 DIAGNOSIS — D708 Other neutropenia: Secondary | ICD-10-CM

## 2021-11-30 LAB — CBC WITH DIFFERENTIAL/PLATELET
Abs Immature Granulocytes: 0.02 10*3/uL (ref 0.00–0.07)
Basophils Absolute: 0 10*3/uL (ref 0.0–0.1)
Basophils Relative: 0 %
Eosinophils Absolute: 0.1 10*3/uL (ref 0.0–0.5)
Eosinophils Relative: 2 %
HCT: 40 % (ref 39.0–52.0)
Hemoglobin: 13.1 g/dL (ref 13.0–17.0)
Immature Granulocytes: 0 %
Lymphocytes Relative: 14 %
Lymphs Abs: 0.8 10*3/uL (ref 0.7–4.0)
MCH: 32.2 pg (ref 26.0–34.0)
MCHC: 32.8 g/dL (ref 30.0–36.0)
MCV: 98.3 fL (ref 80.0–100.0)
Monocytes Absolute: 0.7 10*3/uL (ref 0.1–1.0)
Monocytes Relative: 13 %
Neutro Abs: 3.8 10*3/uL (ref 1.7–7.7)
Neutrophils Relative %: 71 %
Platelets: 224 10*3/uL (ref 150–400)
RBC: 4.07 MIL/uL — ABNORMAL LOW (ref 4.22–5.81)
RDW: 13.6 % (ref 11.5–15.5)
WBC: 5.4 10*3/uL (ref 4.0–10.5)
nRBC: 0 % (ref 0.0–0.2)

## 2021-12-02 ENCOUNTER — Encounter: Payer: Self-pay | Admitting: Medical Oncology

## 2021-12-02 ENCOUNTER — Inpatient Hospital Stay (HOSPITAL_BASED_OUTPATIENT_CLINIC_OR_DEPARTMENT_OTHER): Payer: 59 | Admitting: Medical Oncology

## 2021-12-02 DIAGNOSIS — E538 Deficiency of other specified B group vitamins: Secondary | ICD-10-CM

## 2021-12-02 DIAGNOSIS — D708 Other neutropenia: Secondary | ICD-10-CM

## 2021-12-02 NOTE — Progress Notes (Signed)
I connected with Sean [name] on 12/02/21 at  2:45 PM EDT by video enabled telemedicine visit and verified that I am speaking with the correct person using two identifiers.   I discussed the limitations, risks, security and privacy concerns of performing an evaluation and management service by telemedicine and the availability of in-person appointments. I also discussed with the patient that there may be a patient responsible charge related to this service. The patient expressed understanding and agreed to proceed.  Other persons participating in the visit and their role in the encounter:  none  Patient's location:  work Provider's location:  work  Risk analyst Complaint: Discuss results of blood work  History of present illness:  Patient is a 46 year old male who was seen by rheumatology Dr. Posey Pronto for history of stage II rheumatoid arthritis.  He was taking Plaquenil and prednisone for that but stopped it for a while before restarting it again.  He has been referred to [place] for neutropenia.  Most recent CBC from 04/21/2021 showed a white count of 3, H&H of 11.2/33.8 with a platelet count of 216.  Differential mainly showed both lymphopenia and neutropenia with an ANC of 1.3 and a lymphocyte count of 980.  Relative monocytosis with absolute monocyte count was normal.  ESR elevated at 54.  Prior to that in November 2022 his white count was 2.7 as well.  Currently reports some ongoing fatigue as well as joint pain and skin rash.  Denies any recurrent infections or hospitalizations.   Results of blood work from 04/29/2021 showed a white cell count of 4.1 with an [place] of 3.2.  Mild lymphopenia with absolute lymphocyte count of 0.4.  Hepatitis B and C testing negative.  Folate levels were normal and B12 level mildly low at 277.  Interval history: He has been doing well. Since his last appointment he has been taking B12. Fatigue may have improved some but still has his joint pains, SOB, fatigue which are chronic. He  reports that he has a follow up in Oct 26th with [name] with ENT division and will continue care with PCP and rheumatology.    Review of Systems  Constitutional:  Negative for chills, fever, malaise/fatigue and weight loss.  HENT:  Negative for congestion, ear discharge and nosebleeds.   Eyes:  Negative for blurred vision.  Respiratory:  Negative for cough, hemoptysis, sputum production, shortness of breath and wheezing.   Cardiovascular:  Negative for chest pain, palpitations, orthopnea and claudication.  Gastrointestinal:  Negative for abdominal pain, blood in stool, constipation, diarrhea, heartburn, melena, nausea and vomiting.  Genitourinary:  Negative for dysuria, flank pain, frequency, hematuria and urgency.  Musculoskeletal:  Negative for back pain, joint pain and myalgias.  Skin:  Negative for rash.  Neurological:  Negative for dizziness, tingling, focal weakness, seizures, weakness and headaches.  Endo/Heme/Allergies:  Does not bruise/bleed easily.  Psychiatric/Behavioral:  Negative for depression and suicidal ideas. The patient does not have insomnia.     No Known Allergies  Past Medical History:  Diagnosis Date   H/O immunosuppressive therapy    Hyperpigmentation of skin    Hyperpigmentation rash/vesicles of the palm and face   Neutropenia (HCC)    RA (rheumatoid arthritis) ([place])     Past Surgical History:  Procedure Laterality Date   ESOPHAGOGASTRODUODENOSCOPY N/A 09/03/2021   Procedure: ESOPHAGOGASTRODUODENOSCOPY (EGD);  Surgeon: Lesly Rubenstein, MD;  Location: [facility] ENDOSCOPY;  Service: Endoscopy;  Laterality: N/A;   IR GASTROSTOMY TUBE MOD SED  09/11/2021    Social  History   Socioeconomic History   Marital status: Single    Spouse name: Not on file   Number of children: Not on file   Years of education: Not on file   Highest education level: Not on file  Occupational History   Not on file  Tobacco Use   Smoking status: Former    Types: Cigarettes    Smokeless tobacco: Never  Vaping Use   Vaping Use: Never used  Substance and Sexual Activity   Alcohol use: Not Currently   Drug use: Not Currently    Types: Marijuana   Sexual activity: Not on file  Other Topics Concern   Not on file  Social History Narrative   Not on file   Social Determinants of Health   Financial Resource Strain: Not on file  Food Insecurity: Not on file  Transportation Needs: Not on file  Physical Activity: Not on file  Stress: Not on file  Social Connections: Not on file  Intimate Partner Violence: Not on file    Family History  Problem Relation Age of Onset   Hyperlipidemia Mother    Hypertension Father      Current Outpatient Medications:    albuterol (VENTOLIN HFA) 108 (90 Base) MCG/ACT inhaler, Inhale 2 puffs into the lungs every 6 (six) hours as needed for wheezing or shortness of breath., Disp: 8 g, Rfl: 2   blood glucose meter kit and supplies KIT, Dispense based on patient and insurance preference. Use up to four times daily as directed., Disp: 1 each, Rfl: 0   blood glucose meter kit and supplies, Dispense based on patient and insurance preference. Use up to four times daily as directed. (FOR ICD-10 E10.9, E11.9)., Disp: 1 each, Rfl: 0   Blood Glucose Monitoring Suppl (CONTOUR NEXT ONE) KIT, 4 (four) times daily., Disp: , Rfl:    BREZTRI AEROSPHERE 160-9-4.8 MCG/ACT AERO, Inhale 2 puffs into the lungs 2 (two) times daily., Disp: , Rfl:    fluconazole (DIFLUCAN) 100 MG tablet, Take 100 mg by mouth daily., Disp: , Rfl:    folic acid (FOLVITE) 1 MG tablet, Place 1 tablet (1 mg total) into feeding tube daily., Disp: 30 tablet, Rfl: 1   HUMIRA PEN 40 MG/0.4ML PNKT, SMARTSIG:1 pre-filled pen syringe SUB-Q Every 2 Weeks, Disp: , Rfl:    hydroxychloroquine (PLAQUENIL) 200 MG tablet, Place 1 tablet (200 mg total) into feeding tube 2 (two) times daily., Disp: , Rfl:    insulin aspart (NOVOLOG) 100 UNIT/ML FlexPen, Inject 1-9 Units into the skin every 6  (six) hours. CBG 70 - 120: 0 units CBG 121 - 150: 1 unit CBG 151 - 200: 2 units CBG 201 - 250: 3 units CBG 251 - 300: 5 units CBG 301 - 350: 7 units CBG 351 - 400: 9 units CBG > 400: call MD and obtain STAT lab verification, Disp: 15 mL, Rfl: 11   Insulin Pen Needle (PEN NEEDLES 3/16") 31G X 5 MM MISC, 1 pen. by Does not apply route every 6 (six) hours., Disp: 200 each, Rfl: 1   mycophenolate (CELLCEPT) 500 MG tablet, Place 1 tablet (500 mg total) into feeding tube every 12 (twelve) hours., Disp: , Rfl:    predniSONE (DELTASONE) 10 MG tablet, Started with 40 mg daily and decrease dose by 5 mg every week, Disp: 126 tablet, Rfl: 0   sulfamethoxazole-trimethoprim (BACTRIM) 400-80 MG tablet, Place 1 tablet into feeding tube 3 (three) times a week., Disp: , Rfl:    Vitamin D  12.5 MCG/0.25ML LIQD, Give 1 mL by tube daily., Disp: 36 mL, Rfl: 1   witch hazel-glycerin (TUCKS) pad, Apply topically as needed for hemorrhoids., Disp: 40 each, Rfl: 0   omeprazole (FIRST-OMEPRAZOLE) 2 mg/mL SUSP oral suspension, Place 20 mLs (40 mg total) into feeding tube daily., Disp: 600 mL, Rfl: 1  No results found.  No images are attached to the encounter.      Latest Ref Rng & Units 09/13/2021    5:02 AM  CMP  Glucose 70 - 99 mg/dL 93   BUN 6 - 20 mg/dL 14   Creatinine 0.61 - 1.24 mg/dL 0.53   Sodium 135 - 145 mmol/L 136   Potassium 3.5 - 5.1 mmol/L 3.9   Chloride 98 - 111 mmol/L 107   CO2 22 - 32 mmol/L 24   Calcium 8.9 - 10.3 mg/dL 8.9       Latest Ref Rng & Units 11/30/2021    2:10 PM  CBC  WBC 4.0 - 10.5 K/uL 5.4   Hemoglobin 13.0 - 17.0 g/dL 13.1   Hematocrit 39.0 - 52.0 % 40.0   Platelets 150 - 400 K/uL 224      Observation/objective: Appears in no acute distress over video visit today.  Breathing is nonlabored  Assessment and plan: Patient is a 46 year old African-American male referred for neutropenia  Patient's white cell count has been waxing and waning with mild intermittent neutropenia.  No  other cytopenias.  Hepatitis B and C testing negative.  HIV testing negative in the past. TODAY we discussed him continuing to take his B12 supplemenattion. Since blood counts have improved/normalized we will defer his care back to his PCP for ongoing monitoring and management. Should counts become abnormal again he is certainly able to make a follow up with our office. Pt agreeable.   Follow-up instructions: As above  I discussed the assessment and treatment plan with the patient. The patient was provided an opportunity to ask questions and all were answered. The patient agreed with the plan and demonstrated an understanding of the instructions.   The patient was advised to call back or seek an in-person evaluation if the symptoms worsen or if the condition fails to improve as anticipated.    Visit Diagnosis: 1. Other neutropenia ([place])   2. B12 deficiency     [facility] [place] [facility] 12/02/2021 1:30 PM

## 2022-01-12 ENCOUNTER — Other Ambulatory Visit
Admission: RE | Admit: 2022-01-12 | Discharge: 2022-01-12 | Disposition: A | Discharge status: Home / Self Care | Payer: 59 | Attending: Internal Medicine | Admitting: Internal Medicine

## 2022-01-12 DIAGNOSIS — M0560 Rheumatoid arthritis of unspecified site with involvement of other organs and systems: Secondary | ICD-10-CM | POA: Insufficient documentation

## 2022-01-12 DIAGNOSIS — E785 Hyperlipidemia, unspecified: Secondary | ICD-10-CM | POA: Diagnosis not present

## 2022-01-12 DIAGNOSIS — R739 Hyperglycemia, unspecified: Secondary | ICD-10-CM | POA: Insufficient documentation

## 2022-01-12 DIAGNOSIS — E559 Vitamin D deficiency, unspecified: Secondary | ICD-10-CM | POA: Diagnosis not present

## 2022-01-12 DIAGNOSIS — R5383 Other fatigue: Secondary | ICD-10-CM | POA: Insufficient documentation

## 2022-01-12 LAB — CBC WITH DIFFERENTIAL/PLATELET
Abs Immature Granulocytes: 0.01 10*3/uL (ref 0.00–0.07)
Basophils Absolute: 0 10*3/uL (ref 0.0–0.1)
Basophils Relative: 0 %
Eosinophils Absolute: 0 10*3/uL (ref 0.0–0.5)
Eosinophils Relative: 1 %
HCT: 43.2 % (ref 39.0–52.0)
Hemoglobin: 14.2 g/dL (ref 13.0–17.0)
Immature Granulocytes: 0 %
Lymphocytes Relative: 26 %
Lymphs Abs: 0.8 10*3/uL (ref 0.7–4.0)
MCH: 31.5 pg (ref 26.0–34.0)
MCHC: 32.9 g/dL (ref 30.0–36.0)
MCV: 95.8 fL (ref 80.0–100.0)
Monocytes Absolute: 0.6 10*3/uL (ref 0.1–1.0)
Monocytes Relative: 17 %
Neutro Abs: 1.8 10*3/uL (ref 1.7–7.7)
Neutrophils Relative %: 56 %
Platelets: 214 10*3/uL (ref 150–400)
RBC: 4.51 MIL/uL (ref 4.22–5.81)
RDW: 13.3 % (ref 11.5–15.5)
WBC: 3.3 10*3/uL — ABNORMAL LOW (ref 4.0–10.5)
nRBC: 0 % (ref 0.0–0.2)

## 2022-01-12 LAB — HEPATIC FUNCTION PANEL
ALT: 55 U/L — ABNORMAL HIGH (ref 0–44)
AST: 42 U/L — ABNORMAL HIGH (ref 15–41)
Albumin: 3 g/dL — ABNORMAL LOW (ref 3.5–5.0)
Alkaline Phosphatase: 47 U/L (ref 38–126)
Bilirubin, Direct: <0.1 mg/dL (ref 0.0–0.2)
Total Bilirubin: 0.3 mg/dL (ref 0.3–1.2)
Total Protein: 7.4 g/dL (ref 6.5–8.1)

## 2022-01-12 LAB — VITAMIN D 25 HYDROXY (VIT D DEFICIENCY, FRACTURES): Vit D, 25-Hydroxy: 53.19 ng/mL (ref 30–100)

## 2022-01-12 LAB — HEMOGLOBIN A1C
Hgb A1c MFr Bld: 6.4 % — ABNORMAL HIGH (ref 4.8–5.6)
Mean Plasma Glucose: 136.98 mg/dL

## 2022-01-12 LAB — LIPID PANEL
Cholesterol: 176 mg/dL (ref 0–200)
HDL: 38 mg/dL — ABNORMAL LOW (ref >40)
LDL Cholesterol: 91 mg/dL (ref 0–99)
Total CHOL/HDL Ratio: 4.6 RATIO
Triglycerides: 234 mg/dL — ABNORMAL HIGH (ref <150)
VLDL: 47 mg/dL — ABNORMAL HIGH (ref 0–40)

## 2022-01-12 LAB — TSH: TSH: 1.794 u[IU]/mL (ref 0.350–4.500)

## 2022-01-12 LAB — BASIC METABOLIC PANEL
Anion gap: 6 (ref 5–15)
BUN: 16 mg/dL (ref 6–20)
CO2: 25 mmol/L (ref 22–32)
Calcium: 9 mg/dL (ref 8.9–10.3)
Chloride: 108 mmol/L (ref 98–111)
Creatinine, Ser: 0.83 mg/dL (ref 0.61–1.24)
GFR, Estimated: >60 mL/min (ref >60)
Glucose, Bld: 77 mg/dL (ref 70–99)
Potassium: 3.9 mmol/L (ref 3.5–5.1)
Sodium: 139 mmol/L (ref 135–145)

## 2022-01-14 LAB — PROSTATE-SPECIFIC AG, SERUM (LABCORP): Prostate Specific Ag, Serum: 0.8 ng/mL (ref 0.0–4.0)

## 2022-01-29 ENCOUNTER — Emergency Department: Payer: 59

## 2022-01-29 ENCOUNTER — Observation Stay
Admission: EM | Admit: 2022-01-29 | Discharge: 2022-01-31 | Disposition: A | Discharge status: Home / Self Care | Payer: 59 | Attending: Osteopathic Medicine | Admitting: Osteopathic Medicine

## 2022-01-29 ENCOUNTER — Inpatient Hospital Stay: Payer: 59

## 2022-01-29 DIAGNOSIS — Z9981 Dependence on supplemental oxygen: Secondary | ICD-10-CM | POA: Diagnosis not present

## 2022-01-29 DIAGNOSIS — J9611 Chronic respiratory failure with hypoxia: Secondary | ICD-10-CM | POA: Diagnosis present

## 2022-01-29 DIAGNOSIS — Z87891 Personal history of nicotine dependence: Secondary | ICD-10-CM

## 2022-01-29 DIAGNOSIS — M0579 Rheumatoid arthritis with rheumatoid factor of multiple sites without organ or systems involvement: Secondary | ICD-10-CM | POA: Diagnosis present

## 2022-01-29 DIAGNOSIS — Z794 Long term (current) use of insulin: Secondary | ICD-10-CM | POA: Diagnosis not present

## 2022-01-29 DIAGNOSIS — K223 Perforation of esophagus: Secondary | ICD-10-CM

## 2022-01-29 DIAGNOSIS — Z79899 Other long term (current) drug therapy: Secondary | ICD-10-CM

## 2022-01-29 DIAGNOSIS — J189 Pneumonia, unspecified organism: Secondary | ICD-10-CM | POA: Diagnosis not present

## 2022-01-29 DIAGNOSIS — R079 Chest pain, unspecified: Secondary | ICD-10-CM | POA: Diagnosis present

## 2022-01-29 DIAGNOSIS — M069 Rheumatoid arthritis, unspecified: Secondary | ICD-10-CM | POA: Insufficient documentation

## 2022-01-29 DIAGNOSIS — J849 Interstitial pulmonary disease, unspecified: Secondary | ICD-10-CM | POA: Diagnosis present

## 2022-01-29 DIAGNOSIS — M0510 Rheumatoid lung disease with rheumatoid arthritis of unspecified site: Secondary | ICD-10-CM | POA: Diagnosis present

## 2022-01-29 DIAGNOSIS — B370 Candidal stomatitis: Secondary | ICD-10-CM | POA: Diagnosis present

## 2022-01-29 DIAGNOSIS — Z7952 Long term (current) use of systemic steroids: Secondary | ICD-10-CM | POA: Diagnosis not present

## 2022-01-29 DIAGNOSIS — Z789 Other specified health status: Secondary | ICD-10-CM | POA: Insufficient documentation

## 2022-01-29 DIAGNOSIS — M359 Systemic involvement of connective tissue, unspecified: Secondary | ICD-10-CM | POA: Insufficient documentation

## 2022-01-29 DIAGNOSIS — R1310 Dysphagia, unspecified: Secondary | ICD-10-CM | POA: Diagnosis present

## 2022-01-29 LAB — CBC
HCT: 43.5 % (ref 39.0–52.0)
Hemoglobin: 14.4 g/dL (ref 13.0–17.0)
MCH: 31.2 pg (ref 26.0–34.0)
MCHC: 33.1 g/dL (ref 30.0–36.0)
MCV: 94.4 fL (ref 80.0–100.0)
Platelets: 233 10*3/uL (ref 150–400)
RBC: 4.61 MIL/uL (ref 4.22–5.81)
RDW: 13.1 % (ref 11.5–15.5)
WBC: 6.1 10*3/uL (ref 4.0–10.5)
nRBC: 0 % (ref 0.0–0.2)

## 2022-01-29 LAB — BASIC METABOLIC PANEL
Anion gap: 7 (ref 5–15)
BUN: 15 mg/dL (ref 6–20)
CO2: 22 mmol/L (ref 22–32)
Calcium: 8.8 mg/dL — ABNORMAL LOW (ref 8.9–10.3)
Chloride: 105 mmol/L (ref 98–111)
Creatinine, Ser: 0.86 mg/dL (ref 0.61–1.24)
GFR, Estimated: >60 mL/min (ref >60)
Glucose, Bld: 81 mg/dL (ref 70–99)
Potassium: 3.6 mmol/L (ref 3.5–5.1)
Sodium: 134 mmol/L — ABNORMAL LOW (ref 135–145)

## 2022-01-29 LAB — TROPONIN I (HIGH SENSITIVITY): Troponin I (High Sensitivity): 12 ng/L (ref <18)

## 2022-01-29 MED ORDER — IOHEXOL 350 MG/ML SOLN
100.0000 mL | Freq: Once | INTRAVENOUS | Status: AC | PRN
  Administered 2022-01-29: 100 mL via INTRAVENOUS

## 2022-01-29 MED ORDER — INSULIN ASPART 100 UNIT/ML IJ SOLN
0.0000 [IU] | Freq: Three times a day (TID) | INTRAMUSCULAR | Status: DC
  Administered 2022-01-30: 1 [IU] via SUBCUTANEOUS
  Filled 2022-01-29: qty 1

## 2022-01-29 MED ORDER — FLUCONAZOLE 100 MG PO TABS: 100.0000 mg | ORAL_TABLET | Freq: Every day | ORAL | Status: DC

## 2022-01-29 MED ORDER — SODIUM CHLORIDE 0.9 % IV SOLN
1.0000 g | Freq: Once | INTRAVENOUS | Status: AC
  Administered 2022-01-29: 1 g via INTRAVENOUS
  Filled 2022-01-29: qty 10

## 2022-01-29 MED ORDER — BUDESON-GLYCOPYRROL-FORMOTEROL 160-9-4.8 MCG/ACT IN AERO: 2.0000 | INHALATION_SPRAY | Freq: Two times a day (BID) | RESPIRATORY_TRACT | Status: DC

## 2022-01-29 MED ORDER — PREDNISONE 10 MG PO TABS: 10.0000 mg | ORAL_TABLET | Freq: Every day | ORAL | Status: DC

## 2022-01-29 MED ORDER — ENOXAPARIN SODIUM 40 MG/0.4ML IJ SOSY
40.0000 mg | PREFILLED_SYRINGE | INTRAMUSCULAR | Status: DC
  Administered 2022-01-30 – 2022-01-31 (×2): 40 mg via SUBCUTANEOUS
  Filled 2022-01-29 (×2): qty 0.4

## 2022-01-29 MED ORDER — SULFAMETHOXAZOLE-TRIMETHOPRIM 400-80 MG PO TABS: 1.0000 | ORAL_TABLET | ORAL | Status: DC

## 2022-01-29 MED ORDER — OXYCODONE HCL 5 MG PO TABS
5.0000 mg | ORAL_TABLET | ORAL | Status: DC | PRN
  Administered 2022-01-30: 5 mg via ORAL
  Filled 2022-01-29: qty 1

## 2022-01-29 MED ORDER — ONDANSETRON HCL 4 MG PO TABS: 4.0000 mg | ORAL_TABLET | Freq: Four times a day (QID) | ORAL | Status: DC | PRN

## 2022-01-29 MED ORDER — ACETAMINOPHEN 325 MG PO TABS: 650.0000 mg | ORAL_TABLET | Freq: Four times a day (QID) | ORAL | Status: DC | PRN

## 2022-01-29 MED ORDER — HYDROXYCHLOROQUINE SULFATE 200 MG PO TABS
200.0000 mg | ORAL_TABLET | Freq: Two times a day (BID) | ORAL | Status: DC
  Administered 2022-01-30 – 2022-01-31 (×4): 200 mg
  Filled 2022-01-29 (×4): qty 1

## 2022-01-29 MED ORDER — ACETAMINOPHEN 650 MG RE SUPP: 650.0000 mg | Freq: Four times a day (QID) | RECTAL | Status: DC | PRN

## 2022-01-29 MED ORDER — SODIUM CHLORIDE 0.9% FLUSH
3.0000 mL | Freq: Two times a day (BID) | INTRAVENOUS | Status: DC
  Administered 2022-01-30 (×2): 3 mL via INTRAVENOUS

## 2022-01-29 MED ORDER — LIDOCAINE VISCOUS HCL 2 % MT SOLN
15.0000 mL | Freq: Four times a day (QID) | OROMUCOSAL | Status: DC | PRN
  Administered 2022-01-29: 15 mL via OROMUCOSAL
  Filled 2022-01-29: qty 15

## 2022-01-29 MED ORDER — TACROLIMUS 0.1 % EX OINT
TOPICAL_OINTMENT | Freq: Two times a day (BID) | CUTANEOUS | Status: DC
  Administered 2022-01-31: 1 via TOPICAL

## 2022-01-29 MED ORDER — FLUTICASONE FUROATE-VILANTEROL 200-25 MCG/ACT IN AEPB
1.0000 | INHALATION_SPRAY | Freq: Every day | RESPIRATORY_TRACT | Status: DC
  Filled 2022-01-29 (×2): qty 28

## 2022-01-29 MED ORDER — FLUCONAZOLE 100 MG PO TABS
200.0000 mg | ORAL_TABLET | Freq: Once | ORAL | Status: AC
  Administered 2022-01-30: 200 mg via ORAL
  Filled 2022-01-29: qty 2

## 2022-01-29 MED ORDER — SODIUM CHLORIDE 0.9 % IV SOLN
2.0000 g | INTRAVENOUS | Status: DC
  Administered 2022-01-30 – 2022-01-31 (×2): 2 g via INTRAVENOUS
  Filled 2022-01-29 (×2): qty 20

## 2022-01-29 MED ORDER — POLYETHYLENE GLYCOL 3350 17 G PO PACK: 17.0000 g | PACK | Freq: Every day | ORAL | Status: DC | PRN

## 2022-01-29 MED ORDER — MYCOPHENOLATE MOFETIL 250 MG PO CAPS
500.0000 mg | ORAL_CAPSULE | Freq: Two times a day (BID) | ORAL | Status: DC
  Administered 2022-01-30: 500 mg via ORAL
  Filled 2022-01-29 (×2): qty 2

## 2022-01-29 MED ORDER — ONDANSETRON HCL 4 MG/2ML IJ SOLN: 4.0000 mg | Freq: Four times a day (QID) | INTRAMUSCULAR | Status: DC | PRN

## 2022-01-29 MED ORDER — TRAZODONE HCL 50 MG PO TABS: 25.0000 mg | ORAL_TABLET | Freq: Every evening | ORAL | Status: DC | PRN

## 2022-01-29 MED ORDER — FOLIC ACID 1 MG PO TABS
1.0000 mg | ORAL_TABLET | Freq: Every day | ORAL | Status: DC
  Administered 2022-01-30 – 2022-01-31 (×2): 1 mg
  Filled 2022-01-29 (×2): qty 1

## 2022-01-29 MED ORDER — UMECLIDINIUM BROMIDE 62.5 MCG/ACT IN AEPB
1.0000 | INHALATION_SPRAY | Freq: Every day | RESPIRATORY_TRACT | Status: DC
  Filled 2022-01-29 (×2): qty 7

## 2022-01-29 MED ORDER — DOXYCYCLINE HYCLATE 100 MG PO TABS
100.0000 mg | ORAL_TABLET | Freq: Once | ORAL | Status: AC
  Administered 2022-01-29: 100 mg via ORAL
  Filled 2022-01-29: qty 1

## 2022-01-29 MED ORDER — SODIUM CHLORIDE 0.9 % IV SOLN
500.0000 mg | INTRAVENOUS | Status: DC
  Administered 2022-01-30 – 2022-01-31 (×2): 500 mg via INTRAVENOUS
  Filled 2022-01-29 (×2): qty 5

## 2022-01-29 NOTE — Assessment & Plan Note (Signed)
-   Continue hydroxychloroquine - Continue mycophenolate - Continue prednisone - Continue Bactrim

## 2022-01-29 NOTE — H&P (Signed)
[facility] History and Physical  FRANKO HILLIKER IHW:[id number] DOB: 13-Jul-1975 DOA: 01/29/2022  Referring physician: Dr. Jori Moll PCP: Casilda Carls, MD   Chief Complaint: pain with swallowing  HPI: Sean [name] is a 46 y.o. 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  Patient has a complex past medical history.  Last admission was May 23 to June 5 of this year.  At that time he presented with dyspnea and odynophagia, was found to have pneumomediastinum as well as multifocal pneumonia.  He had PEG tube placed, and no further intervention was pursued based on consultation with pulmonary, ENT, and GI.  Since that time he has had numerous follow-ups with all of these specialties.  Last saw [facility] on October 13, at that time imaging was reviewed that continued to show an esophageal perforation, however surgery was not felt to be inherently helpful, and after review of all his options decision made to think about it and reassess, also reported he did not want to remove his PEG tube just yet.  [facility]aw [facility] on October 17, no major changes to his care plan.   Today patient presents reporting that he has had worsening chest pain that started earlier today, feels like a sharp and burning pain that is primarily exacerbated by eating or drinking.  Somewhat worsened as well by taking a deep breath or coughing.  Reports he has continued to have a cough productive of white sputum, per review of records this appears to be chronic for the past several months.  He does not feel particularly short of breath, has not gone up on his home O2 of 3 L, however he does report feeling more tired in general.  He normally works out daily but has not done so for a week because he has not felt up to it.  His mother has been sick with cold-like  symptoms of late, she has been taking him around to his many appointments in the past week.  He reports he is mostly adherent with his medications with the exception of CellCept which she stopped for about a week because he felt it was making him feel weird, however in the past week since he has seen multiple specialist all of whom have recommended that he resume them he has done so.  In the ED vital signs notable for tachycardia to 115, O2 of 94% on his usual 3 L oxygen, remainder unremarkable.  CBC and BMP were unremarkable.  Troponin was normal.  EKG was unchanged from prior and had no ischemic changes.  Chest x-ray showed changes concerning for progressive airspace disease in bilateral lower lungs, a CTA was then ordered which also showed progression of bilateral lower lung disease concerning for worsening superimposed infection versus worsening chronic interstitial lung disease, small bilateral pleural effusions, no evidence of pneumomediastinum.  He was started on empiric antibiotics for community-acquired pneumonia and admitted for further management.  Review of Systems:  Pertinent positives and negative per HPI, all others reviewed and negative  Past Medical History:  Diagnosis Date   H/O immunosuppressive therapy    Hyperpigmentation of skin    Hyperpigmentation rash/vesicles of the palm and face   Neutropenia (HCC)    RA (rheumatoid arthritis) ([place])    Past Surgical History:  Procedure Laterality Date   ESOPHAGOGASTRODUODENOSCOPY N/A 09/03/2021   Procedure: ESOPHAGOGASTRODUODENOSCOPY (EGD);  Surgeon: Haig Prophet,  Hilton Cork, MD;  Location: [facility] ENDOSCOPY;  Service: Endoscopy;  Laterality: N/A;   IR GASTROSTOMY TUBE MOD SED  09/11/2021   Social History:  reports that he has quit smoking. His smoking use included cigarettes. He has never used smokeless tobacco. He reports that he does not currently use alcohol. He reports that he does not currently use drugs after having used the following  drugs: Marijuana.  No Known Allergies  Family History  Problem Relation Age of Onset   Hyperlipidemia Mother    Hypertension Father      Prior to Admission medications   Medication Sig Start Date End Date Taking? Authorizing Provider  blood glucose meter kit and supplies KIT Dispense based on patient and insurance preference. Use up to four times daily as directed. 09/14/21  Yes Wouk, Ailene Rud, MD  blood glucose meter kit and supplies Dispense based on patient and insurance preference. Use up to four times daily as directed. (FOR ICD-10 E10.9, E11.9). 09/13/21  Yes Mercy Riding, MD  Blood Glucose Monitoring Suppl (CONTOUR NEXT ONE) KIT 4 (four) times daily. 10/16/21  Yes [provider]  BREZTRI AEROSPHERE 160-9-4.8 MCG/ACT AERO Inhale 2 puffs into the lungs 2 (two) times daily. 10/16/21  Yes [provider]  chlorhexidine (PERIDEX) 0.12 % solution 15 mLs 2 (two) times daily. 01/28/22  Yes [provider]  clobetasol ointment (TEMOVATE) 0.05 % Apply topically. 01/28/22  Yes [provider]  hydroxychloroquine (PLAQUENIL) 200 MG tablet Place 1 tablet (200 mg total) into feeding tube 2 (two) times daily. 09/13/21  Yes Mercy Riding, MD  Insulin Pen Needle (PEN NEEDLES 3/16") 31G X 5 MM MISC 1 pen. by Does not apply route every 6 (six) hours. 09/14/21  Yes Wouk, Ailene Rud, MD  mycophenolate (CELLCEPT) 500 MG tablet Place 1 tablet (500 mg total) into feeding tube every 12 (twelve) hours. 09/13/21  Yes Mercy Riding, MD  NONFORMULARY OR COMPOUNDED ITEM LIDOCAINE/MAALOX XS 1:1 01/05/22  Yes [provider]  predniSONE (DELTASONE) 5 MG tablet Take 10 mg by mouth daily. 01/07/22  Yes [provider]  sulfamethoxazole-trimethoprim (BACTRIM) 400-80 MG tablet Place 1 tablet into feeding tube 3 (three) times a week. 09/14/21  Yes Mercy Riding, MD  tacrolimus (PROTOPIC) 0.1 % ointment Apply topically 2 (two) times daily. 01/28/22  Yes [provider]   triamcinolone cream (KENALOG) 0.1 % Apply topically 2 (two) times daily. 01/28/22  Yes [provider]  Vitamin D 12.5 MCG/0.25ML LIQD Give 1 mL by tube daily. 09/13/21  Yes Mercy Riding, MD  albuterol (VENTOLIN HFA) 108 (90 Base) MCG/ACT inhaler Inhale 2 puffs into the lungs every 6 (six) hours as needed for wheezing or shortness of breath. 09/13/21   Mercy Riding, MD  fluconazole (DIFLUCAN) 100 MG tablet Take 100 mg by mouth daily. Patient not taking: Reported on 01/29/2022 10/19/21   [provider]  folic acid (FOLVITE) 1 MG tablet Place 1 tablet (1 mg total) into feeding tube daily. Patient not taking: Reported on 01/29/2022 09/13/21   Mercy Riding, MD  HUMIRA PEN 40 MG/0.4ML PNKT SMARTSIG:1 pre-filled pen syringe SUB-Q Every 2 Weeks 10/16/21   [provider]  insulin aspart (NOVOLOG) 100 UNIT/ML FlexPen Inject 1-9 Units into the skin every 6 (six) hours. CBG 70 - 120: 0 units CBG 121 - 150: 1 unit CBG 151 - 200: 2 units CBG 201 - 250: 3 units CBG 251 - 300: 5 units CBG 301 -  350: 7 units CBG 351 - 400: 9 units CBG > 400: call MD and obtain STAT lab verification 09/14/21   Gwynne Edinger, MD   Physical Exam: Vitals:   01/29/22 1643 01/29/22 1647  BP:  107/73  Pulse:  (!) 115  Resp:  19  Temp:  98.9 F (37.2 C)  TempSrc:  Oral  SpO2:  94%  Weight: 77.1 kg   Height: '6\' 5"'  (1.956 m)     Wt Readings from Last 3 Encounters:  01/29/22 77.1 kg  09/14/21 63.5 kg  08/20/21 74.4 kg    Physical Exam Vitals reviewed.  Constitutional:      General: He is not in acute distress.    Appearance: He is well-developed. He is not ill-appearing or toxic-appearing.  Cardiovascular:     Rate and Rhythm: Normal rate and regular rhythm.     Heart sounds: Normal heart sounds. No murmur heard. Pulmonary:     Effort: Pulmonary effort is normal. No tachypnea or respiratory distress.     Breath sounds: Examination of the right-middle field reveals rales. Examination of the  left-middle field reveals rales. Examination of the right-lower field reveals rales. Examination of the left-lower field reveals rales. Rales present. No wheezing or rhonchi.  Abdominal:     General: Bowel sounds are normal.     Palpations: Abdomen is soft. There is no mass.     Tenderness: There is no abdominal tenderness.  Musculoskeletal:     Right lower leg: No edema.     Left lower leg: No edema.  Skin:    General: Skin is warm and dry.  Neurological:     General: No focal deficit present.     Mental Status: He is alert.  Psychiatric:        Mood and Affect: Mood normal.        Behavior: Behavior normal.            Labs on Admission:  Basic Metabolic Panel: Recent Labs  Lab 01/29/22 1713  NA 134*  K 3.6  CL 105  CO2 22  GLUCOSE 81  BUN 15  CREATININE 0.86  CALCIUM 8.8*   Liver Function Tests: No results for input(s): "AST", "ALT", "ALKPHOS", "BILITOT", "PROT", "ALBUMIN" in the last 168 hours. No results for input(s): "LIPASE", "AMYLASE" in the last 168 hours. No results for input(s): "AMMONIA" in the last 168 hours. CBC: Recent Labs  Lab 01/29/22 1713  WBC 6.1  HGB 14.4  HCT 43.5  MCV 94.4  PLT 233   Cardiac Enzymes: No results for input(s): "CKTOTAL", "CKMB", "CKMBINDEX", "TROPONINI" in the last 168 hours.  BNP (last 3 results) Recent Labs    09/01/21 1227  BNP 22.9    ProBNP (last 3 results) No results for input(s): "PROBNP" in the last 8760 hours.  CBG: No results for input(s): "GLUCAP" in the last 168 hours.  Radiological Exams on Admission: CT Angio Chest PE W/Cm &/Or Wo Cm  Result Date: 01/29/2022 CLINICAL DATA:  Pulmonary embolism (PE) suspected, unknown D-dimer pleuritic cp, h/o perferated esophageal ulcer and pneumomediastinum. EXAM: CT ANGIOGRAPHY CHEST WITH CONTRAST TECHNIQUE: Multidetector CT imaging of the chest was performed using the standard protocol during bolus administration of intravenous contrast. Multiplanar CT image  reconstructions and MIPs were obtained to evaluate the vascular anatomy. RADIATION DOSE REDUCTION: This exam was performed according to the departmental dose-optimization program which includes automated exposure control, adjustment of the mA and/or kV according to patient size and/or use of iterative reconstruction  technique. CONTRAST:  140m OMNIPAQUE IOHEXOL 350 MG/ML SOLN COMPARISON:  Radiograph earlier today. Prior CTs most recently 09/01/2021 FINDINGS: Cardiovascular: There are no filling defects within the pulmonary arteries to suggest pulmonary embolus. Prominence of the left main pulmonary artery at 2.6 cm, prominence of the right main pulmonary artery 2.5 cm. Heart size upper normal. No aortic dissection or aneurysm. Common origin of the brachiocephalic and left common carotid artery, variant arch anatomy. Mediastinum/Nodes: No pneumomediastinum. Mild multifocal mediastinal and hilar adenopathy, lymph nodes measuring up to 13 mm short axis. There is no paraesophageal fluid collection or definite esophageal wall thickening. Lungs/Pleura: Mild emphysema. Bibasilar volume loss. Rather extensive airspace opacities in both lower lobes with air bronchograms, chronic but slightly progressed from May. There additional multifocal patchy airspace disease that is confluent, ground-glass and ill-defined, also progressed. There are small bilateral pleural effusions. No pneumothorax. Upper Abdomen: No acute upper abdominal findings. No free air in the included abdomen. Musculoskeletal: There are no acute or suspicious osseous abnormalities. Review of the MIP images confirms the above findings. IMPRESSION: 1. No pulmonary embolus. 2. Rather extensive bilateral lower lobe airspace opacities with air bronchograms, chronic but slightly progressed from May. There are additional multifocal patchy airspace disease that is confluent, ground-glass and ill-defined, also progressed. Findings may represent infection superimposed on  chronic lung disease. Patient has history of rheumatoid arthritis raising the possibility of rheumatoid lung disease. 3. No pneumomediastinum or findings of esophageal perforation. 4. Small bilateral pleural effusions. 5. Mild multifocal mediastinal and hilar adenopathy is likely reactive. 6. Mild prominence of both main pulmonary arteries suggesting pulmonary arterial hypertension. Aortic Atherosclerosis (ICD10-I70.0) and Emphysema (ICD10-J43.9). Electronically Signed   By: MKeith RakeM.D.   On: 01/29/2022 21:35   DG Chest 2 View  Result Date: 01/29/2022 CLINICAL DATA:  Chest pain EXAM: CHEST - 2 VIEW COMPARISON:  09/03/2021 chest x-ray and CT, chest x-ray 09/11/2021 FINDINGS: Suspected small pleural effusions. Heterogeneous airspace disease at both bases with progression since 09/11/2021. Air collection at the left CP angle similar compared to the previous exams. Stable cardiomediastinal silhouette. No pneumothorax IMPRESSION: Probable small pleural effusions. Heterogeneous airspace disease at the bases appears slightly progressed compared to prior and suggests acute infectious or inflammatory component superimposed on likely chronic lung disease. Electronically Signed   By: KDonavan FoilM.D.   On: 01/29/2022 17:42    EKG: Independently reviewed.  Sinus tachycardia, right axis deviation with poor R wave progression.  Overall poor tracing, difficult to interpret T waves but no apparent ST segment deviations.  No significant change compared to prior.  Assessment/Plan Principal Problem:   CAP (community acquired pneumonia) Active Problems:   Rheumatoid arthritis with pulmonary involvement   Esophageal perforation   ILD (interstitial lung disease) (HCC)  GDECKLIN WEDDINGTONis a 46y.o. 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  CAP  (community acquired pneumonia) Imaging findings suggestive of infectious process superimposed on chronic lung disease.  High risk with rheumatic and interstitial lung disease as well as immunosuppressive therapy.  Started on antibiotics but would be beneficial to get pulm's opinion in a.m. - Ceftriaxone 2 g every 24 hours - Azithromycin 500 mg every 24 hours - Consult pulmonology in a.m. - Continue inhaler regimen  Esophageal perforation Primary symptom on presentation is odynophagia.  CTA does not show any new pneumomediastinum or other findings concerning for infectious  process.  May be having an ulcerative or infectious process of the esophagus.  Does have some thrush in the oropharynx, will start empiric treatment for candidiasis as well as symptomatic treatment.  May benefit from direct imaging of the esophagus. - Viscous lidocaine as needed - Fluconazole 200 mg once followed by 100 mg daily for total 2-week course - Consider GI consult in a.m.  Rheumatoid arthritis with pulmonary involvement - Continue hydroxychloroquine - Continue mycophenolate - Continue prednisone - Continue Bactrim   Code Status: Full Code DVT Prophylaxis: Lovenox Family Communication: Father updated at bedside Disposition Plan: Admit to inpatient, med-surg   Time spent: 71 min  Clarnce Flock MD/MP[facility]  Note:  This document was prepared using Systems analyst and may include unintentional dictation errors.

## 2022-01-29 NOTE — ED Provider Notes (Incomplete)
   [facility] Provider Note    Event Date/Time   First MD Initiated Contact with Patient 01/29/22 1843     (approximate)   History   Chest Pain (Patient presents with midline chest pain that began this morning; He has a PEG tube in place because he had a perforated esophagus earlier this year (caused by esophageal ulcers); The pain is constant but worsens when he takes a deep breath; Took TUMS prior to arrival and states that his eased some of his pain but not completely; Denies SOB)   HPI  Sean [name] is a 46 y.o. male        Physical Exam   Triage Vital Signs: ED Triage Vitals  Enc Vitals Group     BP 01/29/22 1647 107/73     Pulse Rate 01/29/22 1647 (!) 115     Resp 01/29/22 1647 19     Temp 01/29/22 1647 98.9 F (37.2 C)     Temp Source 01/29/22 1647 Oral     SpO2 01/29/22 1647 94 %     Weight 01/29/22 1643 170 lb (77.1 kg)     Height 01/29/22 1643 6\' 5"  (1.956 m)     Head Circumference --      Peak Flow --      Pain Score 01/29/22 1642 9     Pain Loc --      Pain Edu? --      Excl. in [place]? --     Most recent vital signs: Vitals:   01/29/22 1647  BP: 107/73  Pulse: (!) 115  Resp: 19  Temp: 98.9 F (37.2 C)  SpO2: 94%    Physical Exam       IMPRESSION / MDM / ASSESSMENT AND PLAN / ED COURSE  I reviewed the triage vital signs and the nursing notes.  ***     EKG  RADIOLOGY [My interpretation of imaging: ***]    ED Results / Procedures / Treatments   Labs (all labs ordered are listed, but only abnormal results are displayed) Labs interpreted as -    Labs Reviewed  BASIC METABOLIC PANEL - Abnormal; Notable for the following components:      Result Value   Sodium 134 (*)    Calcium 8.8 (*)    All other components within normal limits  CBC  TROPONIN I (HIGH SENSITIVITY)  TROPONIN I (HIGH SENSITIVITY)        PROCEDURES:  Critical Care performed: No  Procedures  Patient's presentation is most  consistent with {EM COPA:27473}   MEDICATIONS ORDERED IN ED: Medications - No data to display  FINAL CLINICAL IMPRESSION(S) / ED DIAGNOSES   Final diagnoses:  None     Rx / DC Orders   ED Discharge Orders     None        Note:  This document was prepared using Dragon voice recognition software and may include unintentional dictation errors.

## 2022-01-29 NOTE — ED Provider Triage Note (Signed)
Emergency Medicine Provider Triage Evaluation Note  DEMARI GALES, a 46 y.o. male with a history of RA as well as previous esophageal rupture, was evaluated in triage. Patient presents to the ED with midline chest discomfort began this morning.  Patient is applied tube in place, due to earlier perforated esophagus.    Pt complains of some epigastric pressure not relieved by Tums this morning but patient does admit to overeating last night prior to going to bed.  He denies any nausea, vomiting, or shortness of breath..  Review of Systems  Positive: Midline chest/epigastric pain Negative: NVD  Physical Exam  BP 107/73   Pulse (!) 115   Temp 98.9 F (37.2 C) (Oral)   Resp 19   Ht 6\' 5"  (1.956 m)   Wt 77.1 kg   SpO2 94%   BMI 20.16 kg/m  Gen:   Awake, no distress  NAD Resp:  Normal effort CTA MSK:   Moves extremities without difficulty  ABD:  Soft, nontender  Medical Decision Making  Medically screening exam initiated at 4:50 PM.  Appropriate orders placed.  ALDAIR RICKEL was informed that the remainder of the evaluation will be completed by another provider, this initial triage assessment does not replace that evaluation, and the importance of remaining in the ED until their evaluation is complete.  Patient to the ED for evaluation of epigastric and midline chest discomfort.  Patient with history of esophageal rupture presents with discomfort.   Melvenia Needles, PA-C 01/29/22 1654

## 2022-01-29 NOTE — Assessment & Plan Note (Addendum)
Imaging findings suggestive of infectious process superimposed on chronic lung disease.  High risk with rheumatic and interstitial lung disease as well as immunosuppressive therapy.  Started on antibiotics but would be beneficial to get pulm's opinion in a.m. - Ceftriaxone 2 g every 24 hours - Azithromycin 500 mg every 24 hours - Consult pulmonology in a.m. - Continue inhaler regimen

## 2022-01-29 NOTE — ED Provider Notes (Signed)
[facility] Provider Note    Event Date/Time   First MD Initiated Contact with Patient 01/29/22 2049     (approximate)   History   Chest Pain (Patient presents with midline chest pain that began this morning; He has a PEG tube in place because he had a perforated esophagus earlier this year (caused by esophageal ulcers); The pain is constant but worsens when he takes a deep breath; Took TUMS prior to arrival and states that his eased some of his pain but not completely; Denies SOB)   HPI  Sean [name] is a 46 y.o. male who presents to the emergency department with chest pain.  History of interstitial lung disease and is on 3 L of home oxygen at baseline.  Patient has a history of rheumatoid arthritis and is on multiple immunosuppressive medications.  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  Recently had a PEG tube placed and had been cleared to tolerate p.o.  States that he has been having some chest pain that he describes as a sharp pain that is worse with swallowing and worse with deep breathing.  Also endorses a cough with some shortness of breath over the past 3 days.  Denies any hemoptysis.  No history of DVT or PE.  Denies any tobacco use or vaping.  States that he has been on amoxicillin for the past 2 weeks.      Physical Exam   Triage Vital Signs: ED Triage Vitals  Enc Vitals Group     BP 01/29/22 1647 107/73     Pulse Rate 01/29/22 1647 (!) 115     Resp 01/29/22 1647 19     Temp 01/29/22 1647 98.9 F (37.2 C)     Temp Source 01/29/22 1647 Oral     SpO2 01/29/22 1647 94 %     Weight 01/29/22 1643 170 lb (77.1 kg)     Height 01/29/22 1643 6\' 5"  (1.956 m)     Head Circumference --      Peak Flow --      Pain Score 01/29/22 1642 9     Pain Loc --      Pain Edu? --      Excl. in [place]? --     Most recent vital signs: Vitals:   01/29/22 1647  BP: 107/73  Pulse: (!) 115  Resp: 19  Temp: 98.9 F (37.2 C)   SpO2: 94%    Physical Exam Constitutional:      Appearance: He is well-developed.  HENT:     Head: Atraumatic.     Comments: 3 small areas of white plaques to the posterior oropharyngeal Eyes:     Conjunctiva/sclera: Conjunctivae normal.  Cardiovascular:     Rate and Rhythm: Regular rhythm.  Pulmonary:     Effort: Tachypnea and accessory muscle usage present.     Comments: On 3 L nasal cannula Musculoskeletal:     Cervical back: Normal range of motion.     Right lower leg: No edema.     Left lower leg: No edema.  Skin:    General: Skin is warm.  Neurological:     Mental Status: He is alert. Mental status is at baseline.          IMPRESSION / MDM / ASSESSMENT AND PLAN / ED COURSE  I reviewed the triage vital signs and the nursing notes.  Differential diagnosis including pneumonia, esophagitis, pulmonary embolism, pneumomediastinum, pleurisy, pericarditis  EKG  My interpretation of the EKG -normal sinus rhythm.  Normal intervals.  No chamber enlargement.  No significant ST elevation or depression.  No signs of acute ischemia or dysrhythmia.  No tachycardic or bradycardic dysrhythmias while on cardiac telemetry.  RADIOLOGY I independently reviewed imaging, my interpretation of imaging: Chest x-ray with findings consistent with bilateral lower lobe pneumonia.  Significant straining to lower lungs.    ED Results / Procedures / Treatments   Labs (all labs ordered are listed, but only abnormal results are displayed) Labs interpreted as -   Troponin negative.  No significant leukocytosis.  Creatinine at baseline.  No significant electrolyte abnormalities.  Labs Reviewed  BASIC METABOLIC PANEL - Abnormal; Notable for the following components:      Result Value   Sodium 134 (*)    Calcium 8.8 (*)    All other components within normal limits  EXPECTORATED SPUTUM ASSESSMENT W GRAM STAIN, RFLX TO RESP C  CBC  LEGIONELLA PNEUMOPHILA SEROGP 1 UR AG  STREP  PNEUMONIAE URINARY ANTIGEN  COMPREHENSIVE METABOLIC PANEL  CBC  TROPONIN I (HIGH SENSITIVITY)    After further conversation with the hospitalist will obtain a CT angiography to evaluate for pulmonary embolism.  Started the patient on antibiotics to cover for treatment failure pneumonia given that the patient is currently on amoxicillin.  We will start the patient on IV Rocephin and doxycycline.   PROCEDURES:  Critical Care performed: No  Procedures  Patient's presentation is most consistent with acute presentation with potential threat to life or bodily function.   MEDICATIONS ORDERED IN ED: Medications  lidocaine (XYLOCAINE) 2 % viscous mouth solution 15 mL (15 mLs Mouth/Throat Given 01/29/22 2243)  fluconazole (DIFLUCAN) tablet 200 mg (has no administration in time range)  fluconazole (DIFLUCAN) tablet 100 mg (has no administration in time range)  hydroxychloroquine (PLAQUENIL) tablet 200 mg (has no administration in time range)  sulfamethoxazole-trimethoprim (BACTRIM) 400-80 MG per tablet 1 tablet (has no administration in time range)  predniSONE (DELTASONE) tablet 10 mg (has no administration in time range)  folic acid (FOLVITE) tablet 1 mg (has no administration in time range)  mycophenolate (CELLCEPT) capsule 500 mg (has no administration in time range)  tacrolimus (PROTOPIC) 0.1 % ointment (has no administration in time range)  cefTRIAXone (ROCEPHIN) 2 g in sodium chloride 0.9 % 100 mL IVPB (has no administration in time range)  azithromycin (ZITHROMAX) 500 mg in sodium chloride 0.9 % 250 mL IVPB (has no administration in time range)  insulin aspart (novoLOG) injection 0-9 Units (has no administration in time range)  enoxaparin (LOVENOX) injection 40 mg (has no administration in time range)  sodium chloride flush (NS) 0.9 % injection 3 mL (has no administration in time range)  acetaminophen (TYLENOL) tablet 650 mg (has no administration in time range)    Or  acetaminophen  (TYLENOL) suppository 650 mg (has no administration in time range)  oxyCODONE (Oxy IR/ROXICODONE) immediate release tablet 5 mg (has no administration in time range)  traZODone (DESYREL) tablet 25 mg (has no administration in time range)  polyethylene glycol (MIRALAX / GLYCOLAX) packet 17 g (has no administration in time range)  ondansetron (ZOFRAN) tablet 4 mg (has no administration in time range)    Or  ondansetron (ZOFRAN) injection 4 mg (has no administration in time range)  fluticasone furoate-vilanterol (BREO ELLIPTA) 200-25 MCG/ACT 1 puff (has no administration in time range)    And  umeclidinium bromide (INCRUSE ELLIPTA) 62.5 MCG/ACT 1 puff (has no administration in  time range)  cefTRIAXone (ROCEPHIN) 1 g in sodium chloride 0.9 % 100 mL IVPB (0 g Intravenous Stopped 01/29/22 2041)  doxycycline (VIBRA-TABS) tablet 100 mg (100 mg Oral Given 01/29/22 2011)  iohexol (OMNIPAQUE) 350 MG/ML injection 100 mL (100 mLs Intravenous Contrast Given 01/29/22 2110)    FINAL CLINICAL IMPRESSION(S) / ED DIAGNOSES   Final diagnoses:  None     Rx / DC Orders   ED Discharge Orders     None        Note:  This document was prepared using Dragon voice recognition software and may include unintentional dictation errors.   Corena Herter, MD 01/30/22 [phone]

## 2022-01-29 NOTE — Assessment & Plan Note (Signed)
Primary symptom on presentation is odynophagia.  CTA does not show any new pneumomediastinum or other findings concerning for infectious process.  May be having an ulcerative or infectious process of the esophagus.  Does have some thrush in the oropharynx, will start empiric treatment for candidiasis as well as symptomatic treatment.  May benefit from direct imaging of the esophagus. - Viscous lidocaine as needed - Fluconazole 200 mg once followed by 100 mg daily for total 2-week course - Consider GI consult in a.m.

## 2022-01-29 NOTE — ED Triage Notes (Signed)
Patient presents with midline chest pain that began this morning; He has a PEG tube in place because he had a perforated esophagus earlier this year (caused by esophageal ulcers); The pain is constant but worsens when he takes a deep breath; Took TUMS prior to arrival and states that his eased some of his pain but not completely; Denies SOB

## 2022-01-30 DIAGNOSIS — J189 Pneumonia, unspecified organism: Secondary | ICD-10-CM | POA: Diagnosis not present

## 2022-01-30 DIAGNOSIS — K223 Perforation of esophagus: Secondary | ICD-10-CM | POA: Diagnosis not present

## 2022-01-30 DIAGNOSIS — J849 Interstitial pulmonary disease, unspecified: Secondary | ICD-10-CM

## 2022-01-30 DIAGNOSIS — M0579 Rheumatoid arthritis with rheumatoid factor of multiple sites without organ or systems involvement: Secondary | ICD-10-CM | POA: Diagnosis not present

## 2022-01-30 LAB — COMPREHENSIVE METABOLIC PANEL
ALT: 33 U/L (ref 0–44)
AST: 35 U/L (ref 15–41)
Albumin: 3 g/dL — ABNORMAL LOW (ref 3.5–5.0)
Alkaline Phosphatase: 44 U/L (ref 38–126)
Anion gap: 4 — ABNORMAL LOW (ref 5–15)
BUN: 12 mg/dL (ref 6–20)
CO2: 27 mmol/L (ref 22–32)
Calcium: 8.8 mg/dL — ABNORMAL LOW (ref 8.9–10.3)
Chloride: 108 mmol/L (ref 98–111)
Creatinine, Ser: 1.02 mg/dL (ref 0.61–1.24)
GFR, Estimated: >60 mL/min (ref >60)
Glucose, Bld: 85 mg/dL (ref 70–99)
Potassium: 4.6 mmol/L (ref 3.5–5.1)
Sodium: 139 mmol/L (ref 135–145)
Total Bilirubin: 0.3 mg/dL (ref 0.3–1.2)
Total Protein: 7.7 g/dL (ref 6.5–8.1)

## 2022-01-30 LAB — CBC
HCT: 43.9 % (ref 39.0–52.0)
Hemoglobin: 14.2 g/dL (ref 13.0–17.0)
MCH: 30.7 pg (ref 26.0–34.0)
MCHC: 32.3 g/dL (ref 30.0–36.0)
MCV: 94.8 fL (ref 80.0–100.0)
Platelets: 246 10*3/uL (ref 150–400)
RBC: 4.63 MIL/uL (ref 4.22–5.81)
RDW: 13.1 % (ref 11.5–15.5)
WBC: 5.2 10*3/uL (ref 4.0–10.5)
nRBC: 0 % (ref 0.0–0.2)

## 2022-01-30 LAB — GLUCOSE, CAPILLARY: Glucose-Capillary: 131 mg/dL — ABNORMAL HIGH (ref 70–99)

## 2022-01-30 LAB — CBG MONITORING, ED
Glucose-Capillary: 76 mg/dL (ref 70–99)
Glucose-Capillary: 73 mg/dL (ref 70–99)

## 2022-01-30 MED ORDER — OSMOLITE 1.2 CAL PO LIQD
1000.0000 mL | ORAL | Status: DC
  Administered 2022-01-30: 1000 mL

## 2022-01-30 MED ORDER — POLYETHYLENE GLYCOL 3350 17 G PO PACK: 17.0000 g | PACK | Freq: Every day | ORAL | Status: DC | PRN

## 2022-01-30 MED ORDER — PREDNISONE 10 MG PO TABS
10.0000 mg | ORAL_TABLET | Freq: Every day | ORAL | Status: DC
  Administered 2022-01-30 – 2022-01-31 (×2): 10 mg
  Filled 2022-01-30 (×2): qty 1

## 2022-01-30 MED ORDER — ACETAMINOPHEN 325 MG PO TABS: 650.0000 mg | ORAL_TABLET | Freq: Four times a day (QID) | ORAL | Status: DC | PRN

## 2022-01-30 MED ORDER — TRAZODONE HCL 50 MG PO TABS: 25.0000 mg | ORAL_TABLET | Freq: Every evening | ORAL | Status: DC | PRN

## 2022-01-30 MED ORDER — OXYCODONE HCL 5 MG PO TABS
5.0000 mg | ORAL_TABLET | ORAL | Status: DC | PRN
  Administered 2022-01-30 – 2022-01-31 (×3): 5 mg
  Filled 2022-01-30 (×3): qty 1

## 2022-01-30 MED ORDER — ACETAMINOPHEN 650 MG RE SUPP: 650.0000 mg | Freq: Four times a day (QID) | RECTAL | Status: DC | PRN

## 2022-01-30 MED ORDER — FLUCONAZOLE 100 MG PO TABS
100.0000 mg | ORAL_TABLET | Freq: Every day | ORAL | Status: DC
  Administered 2022-01-30 – 2022-01-31 (×2): 100 mg
  Filled 2022-01-30 (×2): qty 1

## 2022-01-30 MED ORDER — MYCOPHENOLATE 200 MG/ML ORAL SUSPENSION
500.0000 mg | Freq: Two times a day (BID) | ORAL | Status: DC
  Administered 2022-01-30 – 2022-01-31 (×3): 500 mg
  Filled 2022-01-30 (×3): qty 2.5

## 2022-01-30 NOTE — Hospital Course (Addendum)
Sean [name] is a 46 y.o. male with history of rheumatoid arthritis with associated interstitial lung disease on long-term immunosuppressants and 3 L of home O2, known esophageal perforation, who presents with pain with swallowing, chest pain w/ swallowing, and shortness of breath slightly increased above baseline.  Extensive medical history including immune compromise on medications for rheumatoid arthritis  08/2021 hospitalized with multifocal pneumonia and pneumomediastinum with esophageal perforation after presenting with dyspnea/odynophagia. 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 S/p PEG tube placed on 09/11/21. Most recent CT neck 12/31/21 with persistent extraluminal gas along the posterior margin of the aerodigestive tract extending C3-C5 consistent with persistent leak/perforation. Repeat MBS 01/07/22 done at [facility] reveals extraluminal contrast arising from posterior left cervical esophagus at or just distal to UES and extending into prevertebral space, with prevertebral air. He was cleared for diet by recent SLP visit with [facility]. States that he has been eating regular foods, approximately two small meals a day by mouth, and drinking liquids in addition to using his PEG tube.   Recent ENT visit: "We discussed that there is a real possibility that surgical intervention to try to surgically repair a perforation could make his situation worse. Alternatively, should he elect for observation, it is also impossible to predict if he will get clinically worse again given wound healing concerns with him being on immunosuppressive agents. We had a discussion regarding range of options including observation, to repeat EGD with dilation, HBO, surgical repair, with pros and cons discussed with each, but ultimately this is an ongoing conversation and depends on his own personal preferences as there  is no clearly definitive recommendation or necessity for surgery at this time."  In [facility] ED and early admission 10/20: Tachycardia to 115, O2 94% on 3L [place] (he is on baseline 3L), labs unremarkable, troponin normal, EKG no concerns.  CXR concern for progressive airspace disease, CTA chest bilateral lower lung disease progression, worsening superimposed infection versus chronic ILD, small bilateral pleural effusion, no pneumomediastinum.  Started on empiric antibiotics for CAP.   Consultants:  Gastroenterology - defer formal consult, spoke 10/21 with Dr. Haig Prophet who reviewed records and we discussed the patient, he stated GI available if needed but if no significant worsening or evidence of pneumomediastinum/esophageal perforation, can follow outpatient  Procedures: none      ASSESSMENT & PLAN:   Principal Problem:   CAP (community acquired pneumonia) Active Problems:   Rheumatoid arthritis with pulmonary involvement   Esophageal perforation   ILD (interstitial lung disease) ([place])   CAP (community acquired pneumonia) NO sepsis Chronic interstitial lung disease Chronic hypoxic respiratory failure due to ILD Imaging findings suggestive of infectious process superimposed on chronic lung disease.  High risk with rheumatic and interstitial lung disease as well as immunosuppressive therapy.  Started on antibiotics  Ceftriaxone 2 g every 24 hours Azithromycin 500 mg every 24 hours Consider pulmonology consult if not improving  Continue inhaler regimen  Esophageal perforation Oral candidiasis Primary symptom on presentation is odynophagia.  CTA does not show any new pneumomediastinum or other findings concerning for infectious process.  May be having an ulcerative or infectious process of the esophagus.  Does have some thrush in the oropharynx, will start empiric treatment for candidiasis as well as symptomatic treatment.  May benefit from direct imaging of the esophagus. Viscous  lidocaine as needed Fluconazole 200 mg once followed by 100 mg daily for total 2-week  course N.p.o. for now Dietitian consulted for maintenance of tube feeds  Rheumatoid arthritis with concern for pulmonary involvement Continue hydroxychloroquine Continue mycophenolate Continue prednisone Continue Bactrim    DVT prophylaxis: Lovenox Pertinent IV fluids/nutrition: Tube feeds as noted above, is not needing continuous IV fluids Central lines / invasive devices: PEG tube in place  Code Status: Full code Family Communication: Father and other support person are at bedside on rounds  Disposition: Inpatient TOC needs: None at this time Barriers to discharge / significant pending items: Remaining n.p.o. and on IV antibiotics for now, if stable/improving tomorrow may be able to transition to antibiotics through PEG tube and discharge, if worse may need pulmonary consult.

## 2022-01-30 NOTE — ED Notes (Signed)
Pt is A&Ox4. Pt's wife is sitting bedside. Pt has a [place] 3lpm O2. Pt is here due to chest pain and PNA. Pt has HX of RA and says he has a hole in his esophagus which is why he has a peg tube. [facility] said that he is supposed to be getting tube removed soon. Pt was able to swallow pills and liquids.

## 2022-01-30 NOTE — Progress Notes (Signed)
PROGRESS NOTE    Sean [name]   VPX:[id number] DOB: 08-16-1975  DOA: 01/29/2022 Date of Service: 01/30/22 PCP: [name], MD     Brief Narrative / Hospital Course:  Sean [name] is a 46 y.o. male with history of rheumatoid arthritis with associated interstitial lung disease on long-term immunosuppressants and 3 L of home O2, known esophageal perforation, who presents with pain with swallowing, chest pain w/ swallowing, and shortness of breath slightly increased above baseline.  Extensive medical history including immune compromise on medications for rheumatoid arthritis  08/2021 hospitalized with multifocal pneumonia and pneumomediastinum with esophageal perforation after presenting with dyspnea/odynophagia. 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 S/p PEG tube placed on 09/11/21. Most recent CT neck 12/31/21 with persistent extraluminal gas along the posterior margin of the aerodigestive tract extending C3-C5 consistent with persistent leak/perforation. Repeat MBS 01/07/22 done at [facility] reveals extraluminal contrast arising from posterior left cervical esophagus at or just distal to UES and extending into prevertebral space, with prevertebral air. He was cleared for diet by recent SLP visit with [facility]. States that he has been eating regular foods, approximately two small meals a day by mouth, and drinking liquids in addition to using his PEG tube.   Recent ENT visit: "We discussed that there is a real possibility that surgical intervention to try to surgically repair a perforation could make his situation worse. Alternatively, should he elect for observation, it is also impossible to predict if he will get clinically worse again given wound healing concerns with him being on immunosuppressive agents. We had a discussion regarding range of options including observation, to  repeat EGD with dilation, HBO, surgical repair, with pros and cons discussed with each, but ultimately this is an ongoing conversation and depends on his own personal preferences as there is no clearly definitive recommendation or necessity for surgery at this time."  In [facility] ED and early admission 10/20: Tachycardia to 115, O2 94% on 3L [place] (he is on baseline 3L), labs unremarkable, troponin normal, EKG no concerns.  CXR concern for progressive airspace disease, CTA chest bilateral lower lung disease progression, worsening superimposed infection versus chronic ILD, small bilateral pleural effusion, no pneumomediastinum.  Started on empiric antibiotics for CAP.   Consultants:  Gastroenterology - defer formal consult, spoke 10/21 with Dr. Mia Creek who reviewed records and we discussed the patient, he stated GI available if needed but if no significant worsening or evidence of pneumomediastinum/esophageal perforation, can follow outpatient  Procedures: none      ASSESSMENT & PLAN:   Principal Problem:   CAP (community acquired pneumonia) Active Problems:   Rheumatoid arthritis with pulmonary involvement   Esophageal perforation   ILD (interstitial lung disease) (HCC)   CAP (community acquired pneumonia) NO sepsis Chronic interstitial lung disease Chronic hypoxic respiratory failure due to ILD Imaging findings suggestive of infectious process superimposed on chronic lung disease.  High risk with rheumatic and interstitial lung disease as well as immunosuppressive therapy.  Started on antibiotics  Ceftriaxone 2 g every 24 hours Azithromycin 500 mg every 24 hours Consider pulmonology consult if not improving  Continue inhaler regimen  Esophageal perforation Oral candidiasis Primary symptom on presentation is odynophagia.  CTA does not show any new pneumomediastinum or other findings concerning for infectious process.  May be having an ulcerative or infectious process of the esophagus.   Does have some thrush in the oropharynx, will start  empiric treatment for candidiasis as well as symptomatic treatment.  May benefit from direct imaging of the esophagus. Viscous lidocaine as needed Fluconazole 200 mg once followed by 100 mg daily for total 2-week course N.p.o. for now Dietitian consulted for maintenance of tube feeds  Rheumatoid arthritis with concern for pulmonary involvement Continue hydroxychloroquine Continue mycophenolate Continue prednisone Continue Bactrim    DVT prophylaxis: Lovenox Pertinent IV fluids/nutrition: Tube feeds as noted above, is not needing continuous IV fluids Central lines / invasive devices: PEG tube in place  Code Status: Full code Family Communication: Father and other support person are at bedside on rounds  Disposition: Inpatient TOC needs: None at this time Barriers to discharge / significant pending items: Remaining n.p.o. and on IV antibiotics for now, if stable/improving tomorrow may be able to transition to antibiotics through PEG tube and discharge, if worse may need pulmonary consult.             Subjective:  Patient reports chest pain with swallowing, no dizziness.  Occasional shortness of breath but is about at baseline.  No nausea/vomiting.       Objective:  Vitals:   01/30/22 0530 01/30/22 0900 01/30/22 1044 01/30/22 1500  BP: 106/75  109/77 112/78  Pulse: 94  88 86  Resp: (!) 21  (!) 22 20  Temp: 98.3 F (36.8 C) 98.7 F (37.1 C) 98.6 F (37 C) 98.7 F (37.1 C)  TempSrc: Oral Oral Oral Oral  SpO2: 97%  98% 96%  Weight:      Height:       No intake or output data in the 24 hours ending 01/30/22 1623 Filed Weights   01/29/22 1643  Weight: 77.1 kg    Examination:  Constitutional:  VS as above General Appearance: alert,  NAD Respiratory: Normal respiratory effort No wheeze No rhonchi (+)bilateral rales at bases Coarse breath sounds bases/mid lungs bilaterally  Cardiovascular: S1/S2  normal No murmur No rub/gallop auscultated No JVD No lower extremity edema Gastrointestinal: No tenderness Musculoskeletal:  No clubbing/cyanosis of digits Symmetrical movement in all extremities Neurological: No cranial nerve deficit on limited exam Alert Psychiatric: Normal judgment/insight Normal mood and affect       Scheduled Medications:   enoxaparin (LOVENOX) injection  40 mg Subcutaneous Q24H   fluconazole  100 mg Per Tube Daily   fluticasone furoate-vilanterol  1 puff Inhalation Daily   And   umeclidinium bromide  1 puff Inhalation Daily   folic acid  1 mg Per Tube Daily   hydroxychloroquine  200 mg Per Tube BID   insulin aspart  0-9 Units Subcutaneous TID WC   mycophenolate  500 mg Per Tube Q12H   predniSONE  10 mg Per Tube Daily   sodium chloride flush  3 mL Intravenous Q12H   [START ON 02/01/2022] sulfamethoxazole-trimethoprim  1 tablet Per Tube Once per day on Mon Wed Fri   tacrolimus   Topical BID    Continuous Infusions:  azithromycin Stopped (01/30/22 0320)   cefTRIAXone (ROCEPHIN)  IV Stopped (01/30/22 1159)    PRN Medications:  acetaminophen **OR** acetaminophen, lidocaine, [DISCONTINUED] ondansetron **OR** ondansetron (ZOFRAN) IV, oxyCODONE, polyethylene glycol, traZODone  Antimicrobials:  Anti-infectives (From admission, onward)    Start     Dose/Rate Route Frequency Ordered Stop   02/01/22 0900  sulfamethoxazole-trimethoprim (BACTRIM) 400-80 MG per tablet 1 tablet        1 tablet Per Tube Once per day on Mon Wed Fri 01/29/22 2239     01/30/22 1000  fluconazole (DIFLUCAN) tablet 100 mg  Status:  Discontinued        100 mg Oral Daily 01/29/22 2239 01/30/22 0843   01/30/22 1000  cefTRIAXone (ROCEPHIN) 2 g in sodium chloride 0.9 % 100 mL IVPB        2 g 200 mL/hr over 30 Minutes Intravenous Every 24 hours 01/29/22 2239 02/04/22 0959   01/30/22 1000  fluconazole (DIFLUCAN) tablet 100 mg        100 mg Per Tube Daily 01/30/22 0843 02/12/22 0959    01/29/22 2245  fluconazole (DIFLUCAN) tablet 200 mg        200 mg Oral  Once 01/29/22 2239 01/30/22 0102   01/29/22 2245  hydroxychloroquine (PLAQUENIL) tablet 200 mg        200 mg Per Tube 2 times daily 01/29/22 2239     01/29/22 2245  azithromycin (ZITHROMAX) 500 mg in sodium chloride 0.9 % 250 mL IVPB        500 mg 250 mL/hr over 60 Minutes Intravenous Every 24 hours 01/29/22 2239 02/03/22 2244   01/29/22 1945  cefTRIAXone (ROCEPHIN) 1 g in sodium chloride 0.9 % 100 mL IVPB        1 g 200 mL/hr over 30 Minutes Intravenous  Once 01/29/22 1940 01/29/22 2041   01/29/22 1945  doxycycline (VIBRA-TABS) tablet 100 mg        100 mg Oral  Once 01/29/22 1940 01/29/22 2011       Data Reviewed: I have personally reviewed following labs and imaging studies  CBC: Recent Labs  Lab 01/29/22 1713 01/30/22 0543  WBC 6.1 5.2  HGB 14.4 14.2  HCT 43.5 43.9  MCV 94.4 94.8  PLT 233 703   Basic Metabolic Panel: Recent Labs  Lab 01/29/22 1713 01/30/22 0543  NA 134* 139  K 3.6 4.6  CL 105 108  CO2 22 27  GLUCOSE 81 85  BUN 15 12  CREATININE 0.86 1.02  CALCIUM 8.8* 8.8*   GFR: Estimated Creatinine Clearance: 100.8 mL/min (by C-G formula based on SCr of 1.02 mg/dL). Liver Function Tests: Recent Labs  Lab 01/30/22 0543  AST 35  ALT 33  ALKPHOS 44  BILITOT 0.3  PROT 7.7  ALBUMIN 3.0*   No results for input(s): "LIPASE", "AMYLASE" in the last 168 hours. No results for input(s): "AMMONIA" in the last 168 hours. Coagulation Profile: No results for input(s): "INR", "PROTIME" in the last 168 hours. Cardiac Enzymes: No results for input(s): "CKTOTAL", "CKMB", "CKMBINDEX", "TROPONINI" in the last 168 hours. BNP (last 3 results) No results for input(s): "PROBNP" in the last 8760 hours. HbA1C: No results for input(s): "HGBA1C" in the last 72 hours. CBG: Recent Labs  Lab 01/30/22 0725 01/30/22 1127  GLUCAP 76 73   Lipid Profile: No results for input(s): "CHOL", "HDL",  "LDLCALC", "TRIG", "CHOLHDL", "LDLDIRECT" in the last 72 hours. Thyroid Function Tests: No results for input(s): "TSH", "T4TOTAL", "FREET4", "T3FREE", "THYROIDAB" in the last 72 hours. Anemia Panel: No results for input(s): "VITAMINB12", "FOLATE", "FERRITIN", "TIBC", "IRON", "RETICCTPCT" in the last 72 hours. Urine analysis: No results found for: "COLORURINE", "APPEARANCEUR", "LABSPEC", "PHURINE", "GLUCOSEU", "HGBUR", "BILIRUBINUR", "KETONESUR", "PROTEINUR", "UROBILINOGEN", "NITRITE", "LEUKOCYTESUR" Sepsis Labs: @LABRCNTIP (procalcitonin:4,lacticidven:4)  No results found for this or any previous visit (from the past 240 hour(s)).       Radiology Studies: CT Angio Chest PE W/Cm &/Or Wo Cm  Result Date: 01/29/2022 CLINICAL DATA:  Pulmonary embolism (PE) suspected, unknown D-dimer pleuritic cp, h/o perferated esophageal ulcer and pneumomediastinum. EXAM:  CT ANGIOGRAPHY CHEST WITH CONTRAST TECHNIQUE: Multidetector CT imaging of the chest was performed using the standard protocol during bolus administration of intravenous contrast. Multiplanar CT image reconstructions and MIPs were obtained to evaluate the vascular anatomy. RADIATION DOSE REDUCTION: This exam was performed according to the departmental dose-optimization program which includes automated exposure control, adjustment of the mA and/or kV according to patient size and/or use of iterative reconstruction technique. CONTRAST:  OMNIPAQUE IOHEXOL 350 MG/ML SOLN COMPARISON:  Radiograph earlier today. Prior CTs most recently 09/01/2021 FINDINGS: Cardiovascular: There are no filling defects within the pulmonary arteries to suggest pulmonary embolus. Prominence of the left main pulmonary artery at 2.6 cm, prominence of the right main pulmonary artery 2.5 cm. Heart size upper normal. No aortic dissection or aneurysm. Common origin of the brachiocephalic and left common carotid artery, variant arch anatomy. Mediastinum/Nodes: No  pneumomediastinum. Mild multifocal mediastinal and hilar adenopathy, lymph nodes measuring up to 13 mm short axis. There is no paraesophageal fluid collection or definite esophageal wall thickening. Lungs/Pleura: Mild emphysema. Bibasilar volume loss. Rather extensive airspace opacities in both lower lobes with air bronchograms, chronic but slightly progressed from May. There additional multifocal patchy airspace disease that is confluent, ground-glass and ill-defined, also progressed. There are small bilateral pleural effusions. No pneumothorax. Upper Abdomen: No acute upper abdominal findings. No free air in the included abdomen. Musculoskeletal: There are no acute or suspicious osseous abnormalities. Review of the MIP images confirms the above findings. IMPRESSION: 1. No pulmonary embolus. 2. Rather extensive bilateral lower lobe airspace opacities with air bronchograms, chronic but slightly progressed from May. There are additional multifocal patchy airspace disease that is confluent, ground-glass and ill-defined, also progressed. Findings may represent infection superimposed on chronic lung disease. Patient has history of rheumatoid arthritis raising the possibility of rheumatoid lung disease. 3. No pneumomediastinum or findings of esophageal perforation. 4. Small bilateral pleural effusions. 5. Mild multifocal mediastinal and hilar adenopathy is likely reactive. 6. Mild prominence of both main pulmonary arteries suggesting pulmonary arterial hypertension. Aortic Atherosclerosis (ICD10-I70.0) and Emphysema (ICD10-J43.9). Electronically Signed   By: Narda Rutherford M.D.   On: 01/29/2022 21:35   DG Chest 2 View  Result Date: 01/29/2022 CLINICAL DATA:  Chest pain EXAM: CHEST - 2 VIEW COMPARISON:  09/03/2021 chest x-ray and CT, chest x-ray 09/11/2021 FINDINGS: Suspected small pleural effusions. Heterogeneous airspace disease at both bases with progression since 09/11/2021. Air collection at the left CP angle  similar compared to the previous exams. Stable cardiomediastinal silhouette. No pneumothorax IMPRESSION: Probable small pleural effusions. Heterogeneous airspace disease at the bases appears slightly progressed compared to prior and suggests acute infectious or inflammatory component superimposed on likely chronic lung disease. Electronically Signed   By: Jasmine Pang M.D.   On: 01/29/2022 17:42            LOS: 1 day    Time spent: 50 minutes     Sunnie Nielsen, DO [facility] 01/30/2022, 4:23 PM   Staff may message me via secure chat in Epic  but this may not receive immediate response,  please page for urgent matters!  If 7PM-7AM, please contact night-coverage [email or web address]  Dictation software was used to generate the above note. Typos may occur and escape review, as with typed/written notes. Please contact Dr Lyn Hollingshead directly for clarity if needed.

## 2022-01-30 NOTE — ED Notes (Signed)
Pt and wife are resting quietly. Pt's pulse ox changed out due to low reading, trying to determine if due to clip type that pt is desatting

## 2022-01-31 ENCOUNTER — Inpatient Hospital Stay: Payer: 59

## 2022-01-31 DIAGNOSIS — M0579 Rheumatoid arthritis with rheumatoid factor of multiple sites without organ or systems involvement: Secondary | ICD-10-CM | POA: Diagnosis not present

## 2022-01-31 DIAGNOSIS — K223 Perforation of esophagus: Secondary | ICD-10-CM | POA: Diagnosis not present

## 2022-01-31 DIAGNOSIS — J189 Pneumonia, unspecified organism: Secondary | ICD-10-CM | POA: Diagnosis not present

## 2022-01-31 DIAGNOSIS — J849 Interstitial pulmonary disease, unspecified: Secondary | ICD-10-CM | POA: Diagnosis not present

## 2022-01-31 LAB — BASIC METABOLIC PANEL
Anion gap: 5 (ref 5–15)
BUN: 11 mg/dL (ref 6–20)
CO2: 26 mmol/L (ref 22–32)
Calcium: 8.8 mg/dL — ABNORMAL LOW (ref 8.9–10.3)
Chloride: 107 mmol/L (ref 98–111)
Creatinine, Ser: 1.04 mg/dL (ref 0.61–1.24)
GFR, Estimated: >60 mL/min (ref >60)
Glucose, Bld: 72 mg/dL (ref 70–99)
Potassium: 4.4 mmol/L (ref 3.5–5.1)
Sodium: 138 mmol/L (ref 135–145)

## 2022-01-31 LAB — CBC
HCT: 43.4 % (ref 39.0–52.0)
Hemoglobin: 14.4 g/dL (ref 13.0–17.0)
MCH: 30.9 pg (ref 26.0–34.0)
MCHC: 33.2 g/dL (ref 30.0–36.0)
MCV: 93.1 fL (ref 80.0–100.0)
Platelets: 232 10*3/uL (ref 150–400)
RBC: 4.66 MIL/uL (ref 4.22–5.81)
RDW: 13.1 % (ref 11.5–15.5)
WBC: 4.6 10*3/uL (ref 4.0–10.5)
nRBC: 0 % (ref 0.0–0.2)

## 2022-01-31 LAB — GLUCOSE, CAPILLARY
Glucose-Capillary: 70 mg/dL (ref 70–99)
Glucose-Capillary: 82 mg/dL (ref 70–99)

## 2022-01-31 MED ORDER — FLUCONAZOLE 100 MG PO TABS: 100.0000 mg | ORAL_TABLET | Freq: Every day | ORAL | 0 refills | Status: DC

## 2022-01-31 MED ORDER — BREZTRI AEROSPHERE 160-9-4.8 MCG/ACT IN AERO: 2.0000 | INHALATION_SPRAY | Freq: Two times a day (BID) | RESPIRATORY_TRACT | 0 refills | Status: AC

## 2022-01-31 MED ORDER — AZITHROMYCIN 250 MG PO TABS: 250.0000 mg | ORAL_TABLET | Freq: Every day | ORAL | 0 refills | Status: AC

## 2022-01-31 MED ORDER — PANTOPRAZOLE SODIUM 40 MG PO TBEC: 40.0000 mg | DELAYED_RELEASE_TABLET | Freq: Every day | ORAL | 0 refills | Status: DC

## 2022-01-31 MED ORDER — POLYETHYLENE GLYCOL 3350 17 G PO PACK: 17.0000 g | PACK | Freq: Every day | ORAL | 0 refills | Status: DC | PRN

## 2022-01-31 MED ORDER — AMOXICILLIN-POT CLAVULANATE 500-125 MG PO TABS: 1.0000 | ORAL_TABLET | Freq: Two times a day (BID) | ORAL | 0 refills | Status: AC

## 2022-01-31 MED ORDER — LIDOCAINE VISCOUS HCL 2 % MT SOLN: 15.0000 mL | Freq: Four times a day (QID) | OROMUCOSAL | 0 refills | Status: DC | PRN

## 2022-01-31 NOTE — Plan of Care (Signed)

## 2022-01-31 NOTE — Discharge Summary (Signed)
Physician Discharge Summary   Patient: Sean [name] MRN: [id number]  DOB: 1976-03-06   Admit:     Date of Admission: 01/29/2022 Admitted from: home   Discharge: Date of discharge: 01/31/22 Disposition: Home Condition at discharge: fair  CODE STATUS: FULL CODE     Discharge Physician: Emeterio Reeve, DO [facility]     PCP: Casilda Carls, MD  Recommendations for Outpatient Follow-up:  Follow up with PCP Casilda Carls, MD in 1-2 weeks Please obtain labs/tests: CBC, BMP in 1-2 weeks  Please follow up on the following pending results: none PCP AND OTHER OUTPATIENT PROVIDERS: SEE BELOW FOR SPECIFIC DISCHARGE INSTRUCTIONS PRINTED FOR PATIENT IN ADDITION TO GENERIC AVS PATIENT INFO  Discharge Instructions     Diet - low sodium heart healthy   Complete by: As directed    Discharge instructions   Complete by: As directed    STARTING antacid medication protonix  FINISH ANTIBIOTICS azithromycin and amoxicillin-clavulanate   FOLLOW UP WITH YOUR LUNG SPECIALISTS AND OTHER DOCTORS AS SCHEDULED   Increase activity slowly   Complete by: As directed          Discharge Diagnoses: Principal Problem:   CAP (community acquired pneumonia) Active Problems:   Rheumatoid arthritis with pulmonary involvement   Esophageal perforation   ILD (interstitial lung disease) [facility])       Hospital Course: Sean [name] is a 46 y.o. male with history of rheumatoid arthritis with associated interstitial lung disease on long-term immunosuppressants and 3 L of home O2, known esophageal perforation, who presents to [facility] ED on 01/29/22 with pain with swallowing, chest pain w/ swallowing, and shortness of breath slightly increased above baseline.  Extensive medical history including immune compromise on medications for rheumatoid arthritis  Pertinent recent history:   08/2021 hospitalized with multifocal pneumonia and pneumomediastinum with esophageal perforation after  presenting with dyspnea/odynophagia. 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 S/p PEG tube placed on 09/11/21. Most recent CT neck 12/31/21 with persistent extraluminal gas along the posterior margin of the aerodigestive tract extending C3-C5 consistent with persistent leak/perforation. Repeat MBS 01/07/22 done at [facility] reveals extraluminal contrast arising from posterior left cervical esophagus at or just distal to UES and extending into prevertebral space, with prevertebral air. He was cleared for diet by recent SLP visit with [facility]. States that he has been eating regular foods, approximately two small meals a day by mouth, and drinking liquids in addition to using his PEG tube.   Recent ENT visit: "We discussed that there is a real possibility that surgical intervention to try to surgically repair a perforation could make his situation worse. Alternatively, should he elect for observation, it is also impossible to predict if he will get clinically worse again given wound healing concerns with him being on immunosuppressive agents. We had a discussion regarding range of options including observation, to repeat EGD with dilation, HBO, surgical repair, with pros and cons discussed with each, but ultimately this is an ongoing conversation and depends on his own personal preferences as there is no clearly definitive recommendation or necessity for surgery at this time."    [place] [facility] ED and early admission 10/20: Tachycardia to 115, O2 94% on 3L [place] (he is on baseline 3L), labs unremarkable, troponin normal, EKG no concerns.  CXR concern for progressive airspace disease, CTA chest bilateral lower lung disease progression, worsening superimposed infection versus  chronic ILD, small bilateral pleural effusion, no pneumomediastinum.  Started on empiric antibiotics for CAP.  10/21: Persistent  chest pain so kept n.p.o., continued antibiotics for pneumonia. 10/22: Chest pain improved, advised to continue n.p.o. except clear liquids if desired.  Follow with outpatient specialists.  No increased O2 requirement, no concern for sepsis.  CXR stable.  Needs to follow-up outpatient with pulmonology.  Discharged on p.o. antibiotics as below    Consultants:  Gastroenterology - defer formal consult, spoke 10/21 with Dr. Haig Prophet who reviewed records and we discussed the patient, he stated GI available if needed but if no significant worsening or evidence of pneumomediastinum/esophageal perforation, can follow outpatient  Procedures: none      ASSESSMENT & PLAN:   Principal Problem:   CAP (community acquired pneumonia) Active Problems:   Rheumatoid arthritis with pulmonary involvement   Esophageal perforation   ILD (interstitial lung disease) ([place])   CAP (community acquired pneumonia) NO sepsis Chronic interstitial lung disease Chronic hypoxic respiratory failure due to ILD Imaging findings suggestive of infectious process superimposed on chronic lung disease.  High risk with rheumatic and interstitial lung disease as well as immunosuppressive therapy.  Started on antibiotics  Ceftriaxone 2 g every 24 hours Azithromycin 500 mg every 24 hours Consider pulmonology consult if not improving  Continue inhaler regimen  Esophageal perforation Oral candidiasis Primary symptom on presentation is odynophagia.  CTA does not show any new pneumomediastinum or other findings concerning for infectious process.  May be having an ulcerative or infectious process of the esophagus.  Does have some thrush in the oropharynx, will start empiric treatment for candidiasis as well as symptomatic treatment.  May benefit from direct imaging of the esophagus. Viscous lidocaine as needed Fluconazole 200 mg once followed by 100 mg daily for total 2-week course N.p.o. for now Dietitian consulted for  maintenance of tube feeds  Rheumatoid arthritis with concern for pulmonary involvement Continue hydroxychloroquine Continue mycophenolate Continue prednisone Continue Bactrim              Discharge Instructions  Allergies as of 01/31/2022   No Known Allergies      Medication List     TAKE these medications    albuterol 108 (90 Base) MCG/ACT inhaler Commonly known as: VENTOLIN HFA Inhale 2 puffs into the lungs every 6 (six) hours as needed for wheezing or shortness of breath.   amoxicillin-clavulanate 500-125 MG tablet Commonly known as: AUGMENTIN Take 1 tablet by mouth 2 (two) times daily for 3 days. Start taking on: February 01, 2022   azithromycin 250 MG tablet Commonly known as: Zithromax Take 1 tablet (250 mg total) by mouth daily for 3 days. Start taking on: February 01, 2022   blood glucose meter kit and supplies Dispense based on patient and insurance preference. Use up to four times daily as directed. (FOR ICD-10 E10.9, E11.9).   blood glucose meter kit and supplies Kit Dispense based on patient and insurance preference. Use up to four times daily as directed.   Breztri Aerosphere 160-9-4.8 MCG/ACT Aero Generic drug: Budeson-Glycopyrrol-Formoterol Inhale 2 puffs into the lungs 2 (two) times daily.   chlorhexidine 0.12 % solution Commonly known as: PERIDEX 15 mLs 2 (two) times daily.   clobetasol ointment 0.05 % Commonly known as: TEMOVATE Apply topically.   Contour Next One Kit 4 (four) times daily.   fluconazole 100 MG tablet Commonly known as: DIFLUCAN Take 1 tablet (100 mg total) by mouth daily for 10 days. Start taking on: February 01, 2022  folic acid 1 MG tablet Commonly known as: FOLVITE Place 1 tablet (1 mg total) into feeding tube daily.   Humira Pen 40 MG/0.4ML Pnkt Generic drug: Adalimumab SMARTSIG:1 pre-filled pen syringe SUB-Q Every 2 Weeks   hydroxychloroquine 200 MG tablet Commonly known as: PLAQUENIL Place 1  tablet (200 mg total) into feeding tube 2 (two) times daily.   insulin aspart 100 UNIT/ML FlexPen Commonly known as: NOVOLOG Inject 1-9 Units into the skin every 6 (six) hours. CBG 70 - 120: 0 units CBG 121 - 150: 1 unit CBG 151 - 200: 2 units CBG 201 - 250: 3 units CBG 251 - 300: 5 units CBG 301 - 350: 7 units CBG 351 - 400: 9 units CBG > 400: call MD and obtain STAT lab verification   lidocaine 2 % solution Commonly known as: XYLOCAINE Use as directed 15 mLs in the mouth or throat every 6 (six) hours as needed (mouth/throat pain).   mycophenolate 500 MG tablet Commonly known as: CELLCEPT Place 1 tablet (500 mg total) into feeding tube every 12 (twelve) hours.   NONFORMULARY OR COMPOUNDED ITEM LIDOCAINE/MAALOX XS 1:1   pantoprazole 40 MG tablet Commonly known as: Protonix Take 1 tablet (40 mg total) by mouth daily.   Pen Needles 3/16" 31G X 5 MM Misc 1 pen. by Does not apply route every 6 (six) hours.   polyethylene glycol 17 g packet Commonly known as: MIRALAX / GLYCOLAX Take 17 g by mouth daily as needed for mild constipation or moderate constipation.   predniSONE 5 MG tablet Commonly known as: DELTASONE Take 10 mg by mouth daily.   sulfamethoxazole-trimethoprim 400-80 MG tablet Commonly known as: BACTRIM Place 1 tablet into feeding tube 3 (three) times a week.   tacrolimus 0.1 % ointment Commonly known as: PROTOPIC Apply topically 2 (two) times daily.   triamcinolone cream 0.1 % Commonly known as: KENALOG Apply topically 2 (two) times daily.   Vitamin D 12.5 MCG/0.25ML Liqd Give 1 mL by tube daily.          No Known Allergies   Subjective: Patient reports feeling well this morning, no increased SOB and he is off O2 on rounds, no cough, no fever/chills.  Reports pain with swallowing has subsided somewhat.  Asks about potential discharge.   Discharge Exam: BP 107/71 (BP Location: Left Arm)   Pulse 97   Temp 98.7 F (37.1 C) (Oral)   Resp 17   Ht 6'  5" (1.956 m)   Wt 77.1 kg   SpO2 95%   BMI 20.16 kg/m  General: Pt is alert, awake, not in acute distress Cardiovascular: RRR, S1/S2 +, no rubs, no gallops Respiratory: Normal respiratory effort, coarse breath sounds in all fields with rales at bases  abdominal: Soft, NT, ND, bowel sounds + Extremities: no edema, no cyanosis     The results of significant diagnostics from this hospitalization (including imaging, microbiology, ancillary and laboratory) are listed below for reference.     Microbiology: No results found for this or any previous visit (from the past 240 hour(s)).   Labs: BNP (last 3 results) Recent Labs    09/01/21 1227  BNP 50.3   Basic Metabolic Panel: Recent Labs  Lab 01/29/22 1713 01/30/22 0543 01/31/22 0511  NA 134* 139 138  K 3.6 4.6 4.4  CL 105 108 107  CO2 '22 27 26  ' GLUCOSE 81 85 72  BUN '15 12 11  ' CREATININE 0.86 1.02 1.04  CALCIUM 8.8* 8.8* 8.8*  Liver Function Tests: Recent Labs  Lab 01/30/22 0543  AST 35  ALT 33  ALKPHOS 44  BILITOT 0.3  PROT 7.7  ALBUMIN 3.0*   No results for input(s): "LIPASE", "AMYLASE" in the last 168 hours. No results for input(s): "AMMONIA" in the last 168 hours. CBC: Recent Labs  Lab 01/29/22 1713 01/30/22 0543 01/31/22 0511  WBC 6.1 5.2 4.6  HGB 14.4 14.2 14.4  HCT 43.5 43.9 43.4  MCV 94.4 94.8 93.1  PLT 233 246 232   Cardiac Enzymes: No results for input(s): "CKTOTAL", "CKMB", "CKMBINDEX", "TROPONINI" in the last 168 hours. BNP: Invalid input(s): "POCBNP" CBG: Recent Labs  Lab 01/30/22 0725 01/30/22 1127 01/30/22 1830 01/31/22 0453 01/31/22 0919  GLUCAP 76 73 131* 70 82   D-Dimer No results for input(s): "DDIMER" in the last 72 hours. Hgb A1c No results for input(s): "HGBA1C" in the last 72 hours. Lipid Profile No results for input(s): "CHOL", "HDL", "LDLCALC", "TRIG", "CHOLHDL", "LDLDIRECT" in the last 72 hours. Thyroid function studies No results for input(s): "TSH", "T4TOTAL",  "T3FREE", "THYROIDAB" in the last 72 hours.  Invalid input(s): "FREET3" Anemia work up No results for input(s): "VITAMINB12", "FOLATE", "FERRITIN", "TIBC", "IRON", "RETICCTPCT" in the last 72 hours. Urinalysis No results found for: "COLORURINE", "APPEARANCEUR", "LABSPEC", "PHURINE", "GLUCOSEU", "HGBUR", "BILIRUBINUR", "KETONESUR", "PROTEINUR", "UROBILINOGEN", "NITRITE", "LEUKOCYTESUR" Sepsis Labs Recent Labs  Lab 01/29/22 1713 01/30/22 0543 01/31/22 0511  WBC 6.1 5.2 4.6   Microbiology No results found for this or any previous visit (from the past 240 hour(s)). Imaging CT Angio Chest PE W/Cm &/Or Wo Cm  Result Date: 01/29/2022 CLINICAL DATA:  Pulmonary embolism (PE) suspected, unknown D-dimer pleuritic cp, h/o perferated esophageal ulcer and pneumomediastinum. EXAM: CT ANGIOGRAPHY CHEST WITH CONTRAST TECHNIQUE: Multidetector CT imaging of the chest was performed using the standard protocol during bolus administration of intravenous contrast. Multiplanar CT image reconstructions and MIPs were obtained to evaluate the vascular anatomy. RADIATION DOSE REDUCTION: This exam was performed according to the departmental dose-optimization program which includes automated exposure control, adjustment of the mA and/or kV according to patient size and/or use of iterative reconstruction technique. CONTRAST:  146m OMNIPAQUE IOHEXOL 350 MG/ML SOLN COMPARISON:  Radiograph earlier today. Prior CTs most recently 09/01/2021 FINDINGS: Cardiovascular: There are no filling defects within the pulmonary arteries to suggest pulmonary embolus. Prominence of the left main pulmonary artery at 2.6 cm, prominence of the right main pulmonary artery 2.5 cm. Heart size upper normal. No aortic dissection or aneurysm. Common origin of the brachiocephalic and left common carotid artery, variant arch anatomy. Mediastinum/Nodes: No pneumomediastinum. Mild multifocal mediastinal and hilar adenopathy, lymph nodes measuring up to 13  mm short axis. There is no paraesophageal fluid collection or definite esophageal wall thickening. Lungs/Pleura: Mild emphysema. Bibasilar volume loss. Rather extensive airspace opacities in both lower lobes with air bronchograms, chronic but slightly progressed from May. There additional multifocal patchy airspace disease that is confluent, ground-glass and ill-defined, also progressed. There are small bilateral pleural effusions. No pneumothorax. Upper Abdomen: No acute upper abdominal findings. No free air in the included abdomen. Musculoskeletal: There are no acute or suspicious osseous abnormalities. Review of the MIP images confirms the above findings. IMPRESSION: 1. No pulmonary embolus. 2. Rather extensive bilateral lower lobe airspace opacities with air bronchograms, chronic but slightly progressed from May. There are additional multifocal patchy airspace disease that is confluent, ground-glass and ill-defined, also progressed. Findings may represent infection superimposed on chronic lung disease. Patient has history of rheumatoid arthritis raising the possibility of  rheumatoid lung disease. 3. No pneumomediastinum or findings of esophageal perforation. 4. Small bilateral pleural effusions. 5. Mild multifocal mediastinal and hilar adenopathy is likely reactive. 6. Mild prominence of both main pulmonary arteries suggesting pulmonary arterial hypertension. Aortic Atherosclerosis (ICD10-I70.0) and Emphysema (ICD10-J43.9). Electronically Signed   By: Keith Rake M.D.   On: 01/29/2022 21:35   DG Chest 2 View  Result Date: 01/29/2022 CLINICAL DATA:  Chest pain EXAM: CHEST - 2 VIEW COMPARISON:  09/03/2021 chest x-ray and CT, chest x-ray 09/11/2021 FINDINGS: Suspected small pleural effusions. Heterogeneous airspace disease at both bases with progression since 09/11/2021. Air collection at the left CP angle similar compared to the previous exams. Stable cardiomediastinal silhouette. No pneumothorax  IMPRESSION: Probable small pleural effusions. Heterogeneous airspace disease at the bases appears slightly progressed compared to prior and suggests acute infectious or inflammatory component superimposed on likely chronic lung disease. Electronically Signed   By: Donavan Foil M.D.   On: 01/29/2022 17:42      Time coordinating discharge: over 30 minutes  SIGNED:  Emeterio Reeve DO [facility]

## 2022-01-31 NOTE — TOC CM/SW Note (Signed)
  Transition of Care [facility]) Screening Note   Patient Details  Name: Sean [name] Date of Birth: 05-10-1975   Transition of Care [facility]) CM/SW Contact:    Rebekah [place], [place] Phone Number: 01/31/2022, 10:40 AM    Transition of Care Department [facility]) has reviewed patient and no TOC needs have been identified at this time. We will continue to monitor patient advancement through interdisciplinary progression rounds. If new patient transition needs arise, please place a TOC consult.   Christa See, LCSW Transitions of Care

## 2022-02-06 ENCOUNTER — Other Ambulatory Visit: Payer: Self-pay

## 2022-02-06 ENCOUNTER — Emergency Department: Payer: 59

## 2022-02-06 ENCOUNTER — Inpatient Hospital Stay
Admission: EM | Admit: 2022-02-06 | Discharge: 2022-02-12 | DRG: 193 | Disposition: A | Discharge status: Home / Self Care | Payer: 59 | Attending: Internal Medicine | Admitting: Internal Medicine

## 2022-02-06 DIAGNOSIS — M069 Rheumatoid arthritis, unspecified: Secondary | ICD-10-CM | POA: Diagnosis present

## 2022-02-06 DIAGNOSIS — Z931 Gastrostomy status: Secondary | ICD-10-CM

## 2022-02-06 DIAGNOSIS — Z9981 Dependence on supplemental oxygen: Secondary | ICD-10-CM

## 2022-02-06 DIAGNOSIS — J849 Interstitial pulmonary disease, unspecified: Secondary | ICD-10-CM | POA: Diagnosis present

## 2022-02-06 DIAGNOSIS — R590 Localized enlarged lymph nodes: Secondary | ICD-10-CM | POA: Diagnosis present

## 2022-02-06 DIAGNOSIS — M0510 Rheumatoid lung disease with rheumatoid arthritis of unspecified site: Secondary | ICD-10-CM | POA: Diagnosis present

## 2022-02-06 DIAGNOSIS — M359 Systemic involvement of connective tissue, unspecified: Secondary | ICD-10-CM | POA: Diagnosis present

## 2022-02-06 DIAGNOSIS — J9621 Acute and chronic respiratory failure with hypoxia: Secondary | ICD-10-CM | POA: Diagnosis present

## 2022-02-06 DIAGNOSIS — Z7960 Long term (current) use of unspecified immunomodulators and immunosuppressants: Secondary | ICD-10-CM

## 2022-02-06 DIAGNOSIS — I2721 Secondary pulmonary arterial hypertension: Secondary | ICD-10-CM | POA: Diagnosis present

## 2022-02-06 DIAGNOSIS — Z79899 Other long term (current) drug therapy: Secondary | ICD-10-CM

## 2022-02-06 DIAGNOSIS — Z87891 Personal history of nicotine dependence: Secondary | ICD-10-CM

## 2022-02-06 DIAGNOSIS — Y95 Nosocomial condition: Secondary | ICD-10-CM | POA: Diagnosis present

## 2022-02-06 DIAGNOSIS — K223 Perforation of esophagus: Secondary | ICD-10-CM | POA: Diagnosis present

## 2022-02-06 DIAGNOSIS — D84821 Immunodeficiency due to drugs: Secondary | ICD-10-CM | POA: Diagnosis present

## 2022-02-06 DIAGNOSIS — R0902 Hypoxemia: Secondary | ICD-10-CM | POA: Diagnosis not present

## 2022-02-06 DIAGNOSIS — J189 Pneumonia, unspecified organism: Secondary | ICD-10-CM | POA: Diagnosis not present

## 2022-02-06 DIAGNOSIS — Z794 Long term (current) use of insulin: Secondary | ICD-10-CM

## 2022-02-06 DIAGNOSIS — Z1152 Encounter for screening for COVID-19: Secondary | ICD-10-CM

## 2022-02-06 DIAGNOSIS — R0600 Dyspnea, unspecified: Principal | ICD-10-CM

## 2022-02-06 DIAGNOSIS — J188 Other pneumonia, unspecified organism: Secondary | ICD-10-CM | POA: Diagnosis present

## 2022-02-06 DIAGNOSIS — Z7952 Long term (current) use of systemic steroids: Secondary | ICD-10-CM

## 2022-02-06 DIAGNOSIS — Z8249 Family history of ischemic heart disease and other diseases of the circulatory system: Secondary | ICD-10-CM

## 2022-02-06 DIAGNOSIS — M0579 Rheumatoid arthritis with rheumatoid factor of multiple sites without organ or systems involvement: Secondary | ICD-10-CM | POA: Diagnosis present

## 2022-02-06 DIAGNOSIS — J438 Other emphysema: Secondary | ICD-10-CM | POA: Diagnosis present

## 2022-02-06 DIAGNOSIS — Z7951 Long term (current) use of inhaled steroids: Secondary | ICD-10-CM

## 2022-02-06 LAB — COMPREHENSIVE METABOLIC PANEL
ALT: 43 U/L (ref 0–44)
AST: 46 U/L — ABNORMAL HIGH (ref 15–41)
Albumin: 3.3 g/dL — ABNORMAL LOW (ref 3.5–5.0)
Alkaline Phosphatase: 46 U/L (ref 38–126)
Anion gap: 7 (ref 5–15)
BUN: 16 mg/dL (ref 6–20)
CO2: 24 mmol/L (ref 22–32)
Calcium: 9.1 mg/dL (ref 8.9–10.3)
Chloride: 108 mmol/L (ref 98–111)
Creatinine, Ser: 0.94 mg/dL (ref 0.61–1.24)
GFR, Estimated: >60 mL/min (ref >60)
Glucose, Bld: 86 mg/dL (ref 70–99)
Potassium: 4.4 mmol/L (ref 3.5–5.1)
Sodium: 139 mmol/L (ref 135–145)
Total Bilirubin: 0.5 mg/dL (ref 0.3–1.2)
Total Protein: 8.6 g/dL — ABNORMAL HIGH (ref 6.5–8.1)

## 2022-02-06 LAB — CBC WITH DIFFERENTIAL/PLATELET
Abs Immature Granulocytes: 0.03 10*3/uL (ref 0.00–0.07)
Basophils Absolute: 0 10*3/uL (ref 0.0–0.1)
Basophils Relative: 1 %
Eosinophils Absolute: 0.1 10*3/uL (ref 0.0–0.5)
Eosinophils Relative: 1 %
HCT: 44.6 % (ref 39.0–52.0)
Hemoglobin: 14.6 g/dL (ref 13.0–17.0)
Immature Granulocytes: 1 %
Lymphocytes Relative: 16 %
Lymphs Abs: 0.8 10*3/uL (ref 0.7–4.0)
MCH: 31.1 pg (ref 26.0–34.0)
MCHC: 32.7 g/dL (ref 30.0–36.0)
MCV: 94.9 fL (ref 80.0–100.0)
Monocytes Absolute: 0.6 10*3/uL (ref 0.1–1.0)
Monocytes Relative: 13 %
Neutro Abs: 3.3 10*3/uL (ref 1.7–7.7)
Neutrophils Relative %: 68 %
Platelets: 259 10*3/uL (ref 150–400)
RBC: 4.7 MIL/uL (ref 4.22–5.81)
RDW: 13 % (ref 11.5–15.5)
WBC: 4.7 10*3/uL (ref 4.0–10.5)
nRBC: 0 % (ref 0.0–0.2)

## 2022-02-06 LAB — PROCALCITONIN: Procalcitonin: <0.1 ng/mL

## 2022-02-06 LAB — RESP PANEL BY RT-PCR (FLU A&B, COVID) ARPGX2
Influenza A by PCR: NEGATIVE
Influenza B by PCR: NEGATIVE
SARS Coronavirus 2 by RT PCR: NEGATIVE

## 2022-02-06 LAB — TROPONIN I (HIGH SENSITIVITY): Troponin I (High Sensitivity): 11 ng/L (ref <18)

## 2022-02-06 MED ORDER — IOHEXOL 300 MG/ML  SOLN
75.0000 mL | Freq: Once | INTRAMUSCULAR | Status: AC | PRN
  Administered 2022-02-06: 75 mL via INTRAVENOUS

## 2022-02-06 MED ORDER — SODIUM CHLORIDE 0.9 % IV SOLN
500.0000 mg | Freq: Once | INTRAVENOUS | Status: AC
  Administered 2022-02-06: 500 mg via INTRAVENOUS
  Filled 2022-02-06: qty 5

## 2022-02-06 MED ORDER — IPRATROPIUM-ALBUTEROL 0.5-2.5 (3) MG/3ML IN SOLN
3.0000 mL | Freq: Once | RESPIRATORY_TRACT | Status: AC
  Administered 2022-02-06: 3 mL via RESPIRATORY_TRACT
  Filled 2022-02-06: qty 3

## 2022-02-06 MED ORDER — SODIUM CHLORIDE 0.9 % IV SOLN
2.0000 g | Freq: Once | INTRAVENOUS | Status: AC
  Administered 2022-02-06: 2 g via INTRAVENOUS
  Filled 2022-02-06: qty 12.5

## 2022-02-06 NOTE — ED Provider Notes (Signed)
[facility] Provider Note    Event Date/Time   First MD Initiated Contact with Patient 02/06/22 2017     (approximate)   History   Shortness of Breath   HPI  Sean [name] is a 46 y.o. male with history of rheumatoid arthritis with associated interstitial lung disease on long-term immunosuppressants and 3 L of home O2, known esophageal perforation, who complains of increased need for home O2.  Normally only wears 3 L as needed but is now having to wear it continuously.  He is in the ER and required over 6 L to get his sats above 90%.   Patient has a complex past medical history.  Last admission was May 23 to June 5 of this year.  At that time he presented with dyspnea and odynophagia, was found to have pneumomediastinum as well as multifocal pneumonia.  He had PEG tube placed, and no further intervention was pursued based on consultation with pulmonary, ENT, and GI.  Since that time he has had numerous follow-ups with all of these specialties.    Patient does not have a fever.  He is not coughing.  He reports he coughs at home and brings up some white phlegm.  He looks well.  But he is on 6 L of oxygen now and sats are running between 90 and 93%.   Physical Exam   Triage Vital Signs: ED Triage Vitals  Enc Vitals Group     BP 02/06/22 1947 (!) 164/150     Pulse Rate 02/06/22 1947 (!) 105     Resp 02/06/22 1947 18     Temp 02/06/22 1947 98.2 F (36.8 C)     Temp Source 02/06/22 1947 Oral     SpO2 02/06/22 1947 (!) 88 %     Weight 02/06/22 1953 170 lb (77.1 kg)     Height 02/06/22 1953 6\' 5"  (1.956 m)     Head Circumference --      Peak Flow --      Pain Score --      Pain Loc --      Pain Edu? --      Excl. in [place]? --     Most recent vital signs: Vitals:   02/06/22 2245 02/06/22 2300  BP:  109/69  Pulse: (!) 105 (!) 114  Resp: (!) 27 (!) 27  Temp:    SpO2: 92% 90%     General: Awake, no distress.  He at times does seem to be breathing  faster than normal. CV:  Good peripheral perfusion.  Regular rate and rhythm no audible murmurs Resp:  Normal effort.  There is some crackles bilaterally from the end of the scapula down Abd:  No distention.  Soft and nontender Extremities: No edema.   ED Results / Procedures / Treatments   Labs (all labs ordered are listed, but only abnormal results are displayed) Labs Reviewed  COMPREHENSIVE METABOLIC PANEL - Abnormal; Notable for the following components:      Result Value   Total Protein 8.6 (*)    Albumin 3.3 (*)    AST 46 (*)    All other components within normal limits  RESP PANEL BY RT-PCR (FLU A&B, COVID) [id number]  CBC WITH DIFFERENTIAL/PLATELET  PROCALCITONIN  BRAIN NATRIURETIC PEPTIDE  TROPONIN I (HIGH SENSITIVITY)     EKG  EKG read and interpreted by me shows normal sinus rhythm rate of 100 normal axis nonspecific ST-T wave changes   RADIOLOGY X-ray  read by radiology reviewed by me shows stable bilateral lower lobe infiltrates and small effusions.  I reviewed and interpreted the films and agree Chest CT read by radiology reviewed and interpreted by me shows:Stable bibasilar pulmonary consolidative infiltrates with associated volume loss. Numerous superimposed cysts are similar to prior examination, as well as scattered inflammatory appearing centrilobular nodules and, together, the findings raise the question of an underlying interstitial lung disease such as lymphocytic interstitial pneumonia. Necrotizing infection, however, could appear similarly, as can be seen with recurrent aspiration I agree with radiologist interpretation PROCEDURES:  Critical Care performed:   Procedures   MEDICATIONS ORDERED IN ED: Medications  azithromycin (ZITHROMAX) 500 mg in sodium chloride 0.9 % 250 mL IVPB (0 mg Intravenous Stopped 02/06/22 2157)  ceFEPIme (MAXIPIME) 2 g in sodium chloride 0.9 % 100 mL IVPB (0 g Intravenous Stopped 02/06/22 2157)  iohexol (OMNIPAQUE) 300  MG/ML solution 75 mL (75 mLs Intravenous Contrast Given 02/06/22 2125)  ipratropium-albuterol (DUONEB) 0.5-2.5 (3) MG/3ML nebulizer solution 3 mL (3 mLs Nebulization Given 02/06/22 2201)     IMPRESSION / MDM / ASSESSMENT AND PLAN / ED COURSE  I reviewed the triage vital signs and the nursing notes. ----------------------------------------- 11:09 PM on 02/06/2022 ----------------------------------------- Patient remains somewhat tachycardic at a rate of about 101 with respiratory rate of 22 sats remained between 90 and 93%.  He is still on 6 L.  I am treating him for hospital associated pneumonia and not sure if he has that or something else.  But he does have an increased oxygen requirement. Procalcitonin, BNP second troponin are all pending. Differential diagnosis includes, but is not limited to, CHF, necrotizing pneumonia, aspiration pneumonia, hospital-acquired or associated pneumonia  Patient's presentation is most consistent with acute presentation with potential threat to life or bodily function.  The patient is on the cardiac monitor to evaluate for evidence of arrhythmia and/or significant heart rate changes.  Nothing has been seen except sinus tach.    FINAL CLINICAL IMPRESSION(S) / ED DIAGNOSES   Final diagnoses:  Dyspnea, unspecified type  Hypoxia  HCAP (healthcare-associated pneumonia)     Rx / DC Orders   ED Discharge Orders     None        Note:  This document was prepared using Dragon voice recognition software and may include unintentional dictation errors.   Nena Polio, MD 02/06/22 2311    Nena Polio, MD 02/07/22 [phone]

## 2022-02-06 NOTE — ED Triage Notes (Signed)
Pt c/o cough, chest pain, shortness of breath and increased need for home 02. Pt normally wears 02 at 3L at home PRN, but has recently needed to wear 02 continuously. Also reports weakness. Lowest sat at home 74% on RA.  Recently admitted for PNA, dc Sunday.  Pt placed on 02 @ 6L in triage without sats continuing to be 85-87%

## 2022-02-07 ENCOUNTER — Other Ambulatory Visit: Payer: Self-pay

## 2022-02-07 DIAGNOSIS — R590 Localized enlarged lymph nodes: Secondary | ICD-10-CM | POA: Diagnosis present

## 2022-02-07 DIAGNOSIS — M0579 Rheumatoid arthritis with rheumatoid factor of multiple sites without organ or systems involvement: Secondary | ICD-10-CM | POA: Diagnosis not present

## 2022-02-07 DIAGNOSIS — Y95 Nosocomial condition: Secondary | ICD-10-CM | POA: Diagnosis present

## 2022-02-07 DIAGNOSIS — J189 Pneumonia, unspecified organism: Secondary | ICD-10-CM

## 2022-02-07 DIAGNOSIS — Z794 Long term (current) use of insulin: Secondary | ICD-10-CM | POA: Diagnosis not present

## 2022-02-07 DIAGNOSIS — J9621 Acute and chronic respiratory failure with hypoxia: Secondary | ICD-10-CM | POA: Diagnosis present

## 2022-02-07 DIAGNOSIS — M359 Systemic involvement of connective tissue, unspecified: Secondary | ICD-10-CM | POA: Diagnosis not present

## 2022-02-07 DIAGNOSIS — J438 Other emphysema: Secondary | ICD-10-CM | POA: Diagnosis present

## 2022-02-07 DIAGNOSIS — D84821 Immunodeficiency due to drugs: Secondary | ICD-10-CM | POA: Diagnosis present

## 2022-02-07 DIAGNOSIS — Z1152 Encounter for screening for COVID-19: Secondary | ICD-10-CM | POA: Diagnosis not present

## 2022-02-07 DIAGNOSIS — Z7951 Long term (current) use of inhaled steroids: Secondary | ICD-10-CM | POA: Diagnosis not present

## 2022-02-07 DIAGNOSIS — R0902 Hypoxemia: Secondary | ICD-10-CM | POA: Diagnosis present

## 2022-02-07 DIAGNOSIS — Z7952 Long term (current) use of systemic steroids: Secondary | ICD-10-CM | POA: Diagnosis not present

## 2022-02-07 DIAGNOSIS — Z8249 Family history of ischemic heart disease and other diseases of the circulatory system: Secondary | ICD-10-CM | POA: Diagnosis not present

## 2022-02-07 DIAGNOSIS — Z87891 Personal history of nicotine dependence: Secondary | ICD-10-CM | POA: Diagnosis not present

## 2022-02-07 DIAGNOSIS — J849 Interstitial pulmonary disease, unspecified: Secondary | ICD-10-CM | POA: Diagnosis present

## 2022-02-07 DIAGNOSIS — I2721 Secondary pulmonary arterial hypertension: Secondary | ICD-10-CM | POA: Diagnosis present

## 2022-02-07 DIAGNOSIS — Z7960 Long term (current) use of unspecified immunomodulators and immunosuppressants: Secondary | ICD-10-CM | POA: Diagnosis not present

## 2022-02-07 DIAGNOSIS — Z79899 Other long term (current) drug therapy: Secondary | ICD-10-CM | POA: Diagnosis not present

## 2022-02-07 DIAGNOSIS — M069 Rheumatoid arthritis, unspecified: Secondary | ICD-10-CM | POA: Diagnosis present

## 2022-02-07 DIAGNOSIS — Z9981 Dependence on supplemental oxygen: Secondary | ICD-10-CM | POA: Diagnosis not present

## 2022-02-07 DIAGNOSIS — Z931 Gastrostomy status: Secondary | ICD-10-CM | POA: Diagnosis not present

## 2022-02-07 LAB — MRSA NEXT GEN BY PCR, NASAL: MRSA by PCR Next Gen: NOT DETECTED

## 2022-02-07 LAB — CREATININE, SERUM
Creatinine, Ser: 0.91 mg/dL (ref 0.61–1.24)
GFR, Estimated: >60 mL/min (ref >60)

## 2022-02-07 LAB — CBC
HCT: 41.8 % (ref 39.0–52.0)
Hemoglobin: 13.8 g/dL (ref 13.0–17.0)
MCH: 30.5 pg (ref 26.0–34.0)
MCHC: 33 g/dL (ref 30.0–36.0)
MCV: 92.5 fL (ref 80.0–100.0)
Platelets: 241 10*3/uL (ref 150–400)
RBC: 4.52 MIL/uL (ref 4.22–5.81)
RDW: 12.9 % (ref 11.5–15.5)
WBC: 4 10*3/uL (ref 4.0–10.5)
nRBC: 0 % (ref 0.0–0.2)

## 2022-02-07 LAB — BRAIN NATRIURETIC PEPTIDE: B Natriuretic Peptide: 17.4 pg/mL (ref 0.0–100.0)

## 2022-02-07 LAB — SARS CORONAVIRUS 2 BY RT PCR: SARS Coronavirus 2 by RT PCR: NEGATIVE

## 2022-02-07 MED ORDER — ACETAMINOPHEN 325 MG PO TABS: 650.0000 mg | ORAL_TABLET | Freq: Four times a day (QID) | ORAL | Status: DC | PRN

## 2022-02-07 MED ORDER — POLYETHYLENE GLYCOL 3350 17 G PO PACK: 17.0000 g | PACK | Freq: Every day | ORAL | Status: DC | PRN

## 2022-02-07 MED ORDER — VANCOMYCIN HCL 2000 MG/400ML IV SOLN
2000.0000 mg | Freq: Once | INTRAVENOUS | Status: AC
  Administered 2022-02-07: 2000 mg via INTRAVENOUS
  Filled 2022-02-07: qty 400

## 2022-02-07 MED ORDER — VANCOMYCIN HCL 1250 MG/250ML IV SOLN
1250.0000 mg | Freq: Two times a day (BID) | INTRAVENOUS | Status: DC
  Administered 2022-02-07 – 2022-02-08 (×2): 1250 mg via INTRAVENOUS
  Filled 2022-02-07 (×2): qty 250

## 2022-02-07 MED ORDER — PANTOPRAZOLE SODIUM 40 MG PO TBEC
40.0000 mg | DELAYED_RELEASE_TABLET | Freq: Every day | ORAL | Status: DC
  Administered 2022-02-07 – 2022-02-12 (×6): 40 mg via ORAL
  Filled 2022-02-07 (×6): qty 1

## 2022-02-07 MED ORDER — UMECLIDINIUM BROMIDE 62.5 MCG/ACT IN AEPB
1.0000 | INHALATION_SPRAY | Freq: Every day | RESPIRATORY_TRACT | Status: DC
  Administered 2022-02-07 – 2022-02-08 (×2): 1 via RESPIRATORY_TRACT
  Filled 2022-02-07: qty 7

## 2022-02-07 MED ORDER — ACETAMINOPHEN 325 MG PO TABS
650.0000 mg | ORAL_TABLET | Freq: Four times a day (QID) | ORAL | Status: DC | PRN
  Administered 2022-02-08: 650 mg via ORAL
  Filled 2022-02-07: qty 2

## 2022-02-07 MED ORDER — ACETAMINOPHEN 325 MG RE SUPP: 650.0000 mg | Freq: Four times a day (QID) | RECTAL | Status: DC | PRN

## 2022-02-07 MED ORDER — METHYLPREDNISOLONE SODIUM SUCC 40 MG IJ SOLR
40.0000 mg | INTRAMUSCULAR | Status: DC
  Administered 2022-02-07 – 2022-02-08 (×2): 40 mg via INTRAVENOUS
  Filled 2022-02-07 (×2): qty 1

## 2022-02-07 MED ORDER — FLUTICASONE FUROATE-VILANTEROL 100-25 MCG/ACT IN AEPB
1.0000 | INHALATION_SPRAY | Freq: Every day | RESPIRATORY_TRACT | Status: DC
  Administered 2022-02-07 – 2022-02-08 (×2): 1 via RESPIRATORY_TRACT
  Filled 2022-02-07 (×2): qty 28

## 2022-02-07 MED ORDER — ACETAMINOPHEN 650 MG RE SUPP: 650.0000 mg | Freq: Four times a day (QID) | RECTAL | Status: DC | PRN

## 2022-02-07 MED ORDER — HYDROCODONE BIT-HOMATROP MBR 5-1.5 MG/5ML PO SOLN
5.0000 mL | Freq: Four times a day (QID) | ORAL | Status: DC | PRN
  Administered 2022-02-10: 5 mL via ORAL
  Filled 2022-02-07: qty 5

## 2022-02-07 MED ORDER — ONDANSETRON HCL 4 MG PO TABS: 4.0000 mg | ORAL_TABLET | Freq: Four times a day (QID) | ORAL | Status: DC | PRN

## 2022-02-07 MED ORDER — ENOXAPARIN SODIUM 40 MG/0.4ML IJ SOSY
40.0000 mg | PREFILLED_SYRINGE | INTRAMUSCULAR | Status: DC
  Filled 2022-02-07 (×4): qty 0.4

## 2022-02-07 MED ORDER — HYDROXYCHLOROQUINE SULFATE 200 MG PO TABS
200.0000 mg | ORAL_TABLET | Freq: Two times a day (BID) | ORAL | Status: DC
  Administered 2022-02-07 – 2022-02-12 (×11): 200 mg
  Filled 2022-02-07 (×11): qty 1

## 2022-02-07 MED ORDER — GUAIFENESIN ER 600 MG PO TB12
600.0000 mg | ORAL_TABLET | Freq: Two times a day (BID) | ORAL | Status: DC
  Administered 2022-02-07 – 2022-02-12 (×11): 600 mg via ORAL
  Filled 2022-02-07 (×11): qty 1

## 2022-02-07 MED ORDER — ONDANSETRON HCL 4 MG/2ML IJ SOLN
4.0000 mg | Freq: Four times a day (QID) | INTRAMUSCULAR | Status: DC | PRN
  Administered 2022-02-07: 4 mg via INTRAVENOUS
  Filled 2022-02-07: qty 2

## 2022-02-07 MED ORDER — LACTATED RINGERS IV SOLN: INTRAVENOUS | Status: DC

## 2022-02-07 MED ORDER — BUDESON-GLYCOPYRROL-FORMOTEROL 160-9-4.8 MCG/ACT IN AERO: 2.0000 | INHALATION_SPRAY | Freq: Two times a day (BID) | RESPIRATORY_TRACT | Status: DC

## 2022-02-07 MED ORDER — VANCOMYCIN HCL 1500 MG/300ML IV SOLN: 1500.0000 mg | Freq: Two times a day (BID) | INTRAVENOUS | Status: DC

## 2022-02-07 MED ORDER — SODIUM CHLORIDE 0.9 % IV SOLN
500.0000 mg | INTRAVENOUS | Status: DC
  Administered 2022-02-08: 500 mg via INTRAVENOUS
  Filled 2022-02-07: qty 5
  Filled 2022-02-07: qty 500

## 2022-02-07 MED ORDER — ALBUTEROL SULFATE (2.5 MG/3ML) 0.083% IN NEBU: 3.0000 mL | INHALATION_SOLUTION | Freq: Four times a day (QID) | RESPIRATORY_TRACT | Status: DC | PRN

## 2022-02-07 MED ORDER — SODIUM CHLORIDE 0.9 % IV SOLN
2.0000 g | Freq: Three times a day (TID) | INTRAVENOUS | Status: DC
  Administered 2022-02-07 – 2022-02-08 (×5): 2 g via INTRAVENOUS
  Filled 2022-02-07 (×2): qty 12.5
  Filled 2022-02-07: qty 2
  Filled 2022-02-07: qty 12.5
  Filled 2022-02-07: qty 2
  Filled 2022-02-07: qty 12.5

## 2022-02-07 MED ORDER — MORPHINE SULFATE (PF) 2 MG/ML IV SOLN
2.0000 mg | INTRAVENOUS | Status: DC | PRN
  Administered 2022-02-07 (×3): 2 mg via INTRAVENOUS
  Filled 2022-02-07 (×3): qty 1

## 2022-02-07 MED ORDER — SULFAMETHOXAZOLE-TRIMETHOPRIM 400-80 MG PO TABS: 1.0000 | ORAL_TABLET | ORAL | Status: DC

## 2022-02-07 MED ORDER — HYDROCODONE-ACETAMINOPHEN 5-325 MG PO TABS
1.0000 | ORAL_TABLET | ORAL | Status: DC | PRN
  Administered 2022-02-07 – 2022-02-08 (×2): 1 via ORAL
  Filled 2022-02-07 (×2): qty 1

## 2022-02-07 NOTE — Consult Note (Signed)
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  Does have history of smoking with paraseptal emphysema with concomitant areas of likely interstitial lung disease and fibrotic changes worse at the bases bilaterally.  Additionally patient has been on multiple immunosuppressive agents including Humira, Plaquenil, steroids chronically, CellCept which do predispose him for infection.  He has been very difficult to wean from steroids and generally starts to flare underlying interstitial lung disease with worsening hypoxemia when steroids are tapered to less than 40 mg of prednisone.  He does respond fairly quickly to steroids and generally has improvement in oxygenation post glucocorticoid therapy.  Is also had recurrent effusions which have been drained via thoracentesis and have not been shown to be infected or parapneumonic/empyema.  Patient reports over past week he slowly but steadily continued to get weak and more dyspneic.  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 He has been eating orally and has not been eating via PEG tube. He denies feeling flu like illness. He has been urinating well denies dysuria or dark urine.  Having normal well formed with intermittent loose stools.   Patient was asked to stop his Humira after most recent rheumatology evaluation with [name] and reports clinical decline after skipping his last injection.  On  admission in the ED he did not have additional CT chest with findings of consolidative infiltrates with associated volume loss as well as bilateral effusions worse on the right side, mild paraseptal emphysema, mediastinal adenopathy likely reactive or inflammatory, and enlargement of central pulmonary arteries in keeping with possible pulmonary hypertension.  Pulmonary consultation placed for additional evaluation management of complex relatively immunosuppressive patient with interstitial lung disease and systemic autoimmune disease.  Blood work does not show leukocytosis and stable anemia, he does share he has had loose stools over the last week, we did discuss possible infection and a stool PCR panel has been ordered.  Basic metabolic panel is essentially unremarkable with mild leukocytosis, normal renal function with GFR over 60.  Empirically patient is on vancomycin and cefepime, we discussed needing to possibly de-escalate as soon as infectious work-up comes back negative as previous.  PAST MEDICAL HISTORY   Past Medical History:  Diagnosis Date   H/O immunosuppressive therapy    Hyperpigmentation of skin    Hyperpigmentation rash/vesicles of the palm and face   Neutropenia (HCC)    RA (rheumatoid arthritis) ([place])      SURGICAL HISTORY   Past Surgical History:  Procedure Laterality Date   ESOPHAGOGASTRODUODENOSCOPY N/A 09/03/2021   Procedure: ESOPHAGOGASTRODUODENOSCOPY (EGD);  Surgeon: Lesly Rubenstein, MD;  Location: [facility] ENDOSCOPY;  Service: Endoscopy;  Laterality: N/A;   IR GASTROSTOMY TUBE MOD SED  09/11/2021     FAMILY HISTORY   Family History  Problem Relation Age of Onset   Hyperlipidemia Mother    Hypertension Father  SOCIAL HISTORY   Social History   Tobacco Use   Smoking status: Former    Types: Cigarettes   Smokeless tobacco: Never  Vaping Use   Vaping Use: Never used  Substance Use Topics   Alcohol use: Not Currently   Drug use: Not Currently     Types: Marijuana     MEDICATIONS    Home Medication:  Current Outpatient Rx   Order #: [id number] Class: Normal   Order #: [id number] Class: Normal   Order #: [id number] Class: Historical Med   Order #: [id number] Class: Historical Med   Order #: [id number] Class: Normal   Order #: [id number] Class: No Print   Order #: [id number] Class: Normal   Order #: [id number] Class: Normal   Order #: [id number] Class: No Print   Order #: [id number] Class: Normal   Order #: [id number] Class: Historical Med   Order #: [id number] Class: No Print   Order #: [id number] Class: Historical Med   Order #: [id number] Class: Historical Med   Order #: [id number] Class: Normal   Order #: [id number] Class: Normal   Order #: [id number] Class: Historical Med   Order #: [id number] Class: Normal   Order #: [id number] Class: Historical Med   Order #: [id number] Class: Normal   Order #: [id number] Class: Historical Med   Order #: [id number] Class: Normal   Order #: [id number] Class: Normal    Current Medication:  Current Facility-Administered Medications:    acetaminophen (TYLENOL) tablet 650 mg, 650 mg, Oral, Q6H PRN **OR** acetaminophen (TYLENOL) suppository 650 mg, 650 mg, Rectal, Q6H PRN, Athena Masse, MD   albuterol (PROVENTIL) (2.5 MG/3ML) 0.083% nebulizer solution 3 mL, 3 mL, Inhalation, Q6H PRN, Athena Masse, MD   azithromycin (ZITHROMAX) 500 mg in sodium chloride 0.9 % 250 mL IVPB, 500 mg, Intravenous, Q24H, Damita Dunnings, Hazel V, MD   ceFEPIme (MAXIPIME) 2 g in sodium chloride 0.9 % 100 mL IVPB, 2 g, Intravenous, Q8H, Judd Gaudier V, MD, Stopped at 02/07/22 1535   enoxaparin (LOVENOX) injection 40 mg, 40 mg, Subcutaneous, Q24H, Duncan, Hazel V, MD   fluticasone furoate-vilanterol (BREO ELLIPTA) 100-25 MCG/ACT 1 puff, 1 puff, Inhalation, Daily, 1 puff at 02/07/22 1408 **AND** umeclidinium bromide (INCRUSE ELLIPTA) 62.5 MCG/ACT 1 puff, 1 puff, Inhalation, Daily, Wynelle Cleveland, RPH, 1 puff at 02/07/22 1408   guaiFENesin (MUCINEX) 12  hr tablet 600 mg, 600 mg, Oral, BID, Val Riles, MD, 600 mg at 02/07/22 1150   HYDROcodone bit-homatropine (HYCODAN) 5-1.5 MG/5ML syrup 5 mL, 5 mL, Oral, Q6H PRN, Val Riles, MD   HYDROcodone-acetaminophen (NORCO/VICODIN) 5-325 MG per tablet 1-2 tablet, 1-2 tablet, Oral, Q4H PRN, Athena Masse, MD   hydroxychloroquine (PLAQUENIL) tablet 200 mg, 200 mg, Per Tube, BID, Judd Gaudier V, MD, 200 mg at 02/07/22 1406   lactated ringers infusion, , Intravenous, Continuous, Judd Gaudier V, MD, Last Rate: 100 mL/hr at 02/07/22 1628, New Bag at 02/07/22 1628   morphine (PF) 2 MG/ML injection 2 mg, 2 mg, Intravenous, Q2H PRN, Judd Gaudier V, MD, 2 mg at 02/07/22 1626   ondansetron (ZOFRAN) tablet 4 mg, 4 mg, Oral, Q6H PRN **OR** ondansetron (ZOFRAN) injection 4 mg, 4 mg, Intravenous, Q6H PRN, Judd Gaudier V, MD, 4 mg at 02/07/22 0458   pantoprazole (PROTONIX) EC tablet 40 mg, 40 mg, Oral, Daily, Judd Gaudier V, MD, 40 mg at 02/07/22 1150   polyethylene glycol (MIRALAX / GLYCOLAX) packet 17 g, 17 g, Oral, Daily PRN, Athena Masse, MD   vancomycin (VANCOREADY) IVPB 1250 mg/250 mL, 1,250 mg, Intravenous, Q12H, Oswald Hillock, RPH, Last Rate: 166.7 mL/hr at  02/07/22 1722, 1,250 mg at 02/07/22 1722  Current Outpatient Medications:    albuterol (VENTOLIN HFA) 108 (90 Base) MCG/ACT inhaler, Inhale 2 puffs into the lungs every 6 (six) hours as needed for wheezing or shortness of breath., Disp: 8 g, Rfl: 2   BREZTRI AEROSPHERE 160-9-4.8 MCG/ACT AERO, Inhale 2 puffs into the lungs 2 (two) times daily., Disp: 10.7 g, Rfl: 0   chlorhexidine (PERIDEX) 0.12 % solution, 15 mLs 2 (two) times daily., Disp: , Rfl:    clobetasol ointment (TEMOVATE) 0.05 %, Apply topically., Disp: , Rfl:    fluconazole (DIFLUCAN) 100 MG tablet, Take 1 tablet (100 mg total) by mouth daily for 10 days., Disp: 10 tablet, Rfl: 0   hydroxychloroquine (PLAQUENIL) 200 MG tablet, Place 1 tablet (200 mg total) into feeding tube 2 (two)  times daily., Disp: , Rfl:    insulin aspart (NOVOLOG) 100 UNIT/ML FlexPen, Inject 1-9 Units into the skin every 6 (six) hours. CBG 70 - 120: 0 units CBG 121 - 150: 1 unit CBG 151 - 200: 2 units CBG 201 - 250: 3 units CBG 251 - 300: 5 units CBG 301 - 350: 7 units CBG 351 - 400: 9 units CBG > 400: call MD and obtain STAT lab verification, Disp: 15 mL, Rfl: 11   lidocaine (XYLOCAINE) 2 % solution, Use as directed 15 mLs in the mouth or throat every 6 (six) hours as needed (mouth/throat pain)., Disp: 100 mL, Rfl: 0   mycophenolate (CELLCEPT) 500 MG tablet, Place 1 tablet (500 mg total) into feeding tube every 12 (twelve) hours., Disp: , Rfl:    pantoprazole (PROTONIX) 40 MG tablet, Take 1 tablet (40 mg total) by mouth daily., Disp: 30 tablet, Rfl: 0   predniSONE (DELTASONE) 5 MG tablet, Take 10 mg by mouth daily., Disp: , Rfl:    sulfamethoxazole-trimethoprim (BACTRIM) 400-80 MG tablet, Place 1 tablet into feeding tube 3 (three) times a week., Disp: , Rfl:    tacrolimus (PROTOPIC) 0.1 % ointment, Apply topically 2 (two) times daily., Disp: , Rfl:    triamcinolone cream (KENALOG) 0.1 %, Apply topically 2 (two) times daily., Disp: , Rfl:    blood glucose meter kit and supplies KIT, Dispense based on patient and insurance preference. Use up to four times daily as directed., Disp: 1 each, Rfl: 0   blood glucose meter kit and supplies, Dispense based on patient and insurance preference. Use up to four times daily as directed. (FOR ICD-10 E10.9, E11.9)., Disp: 1 each, Rfl: 0   Blood Glucose Monitoring Suppl (CONTOUR NEXT ONE) KIT, 4 (four) times daily., Disp: , Rfl:    folic acid (FOLVITE) 1 MG tablet, Place 1 tablet (1 mg total) into feeding tube daily. (Patient not taking: Reported on 01/29/2022), Disp: 30 tablet, Rfl: 1   HUMIRA PEN 40 MG/0.4ML PNKT, SMARTSIG:1 pre-filled pen syringe SUB-Q Every 2 Weeks, Disp: , Rfl:    Insulin Pen Needle (PEN NEEDLES 3/16") 31G X 5 MM MISC, 1 pen. by Does not apply route  every 6 (six) hours., Disp: 200 each, Rfl: 1   NONFORMULARY OR COMPOUNDED ITEM, LIDOCAINE/MAALOX XS 1:1, Disp: , Rfl:    polyethylene glycol (MIRALAX / GLYCOLAX) 17 g packet, Take 17 g by mouth daily as needed for mild constipation or moderate constipation. (Patient not taking: Reported on 02/07/2022), Disp: 14 each, Rfl: 0   Vitamin D 12.5 MCG/0.25ML LIQD, Give 1 mL by tube daily., Disp: 36 mL, Rfl: 1    ALLERGIES   Patient has  no known allergies.     REVIEW OF SYSTEMS    Review of Systems:  Gen:  Denies  fever, sweats, chills weigh loss  HEENT: Denies blurred vision, double vision, ear pain, eye pain, hearing loss, nose bleeds, sore throat Cardiac:  No dizziness, chest pain or heaviness, chest tightness,edema Resp:   reports dyspnea chronically  Gi: Denies swallowing difficulty, stomach pain, nausea or vomiting, diarrhea, constipation, bowel incontinence Gu:  Denies bladder incontinence, burning urine Ext:   Denies Joint pain, stiffness or swelling Skin: Denies  skin rash, easy bruising or bleeding or hives Endoc:  Denies polyuria, polydipsia , polyphagia or weight change Psych:   Denies depression, insomnia or hallucinations   Other:  All other systems negative   VS: BP 96/68   Pulse 93   Temp 98.2 F (36.8 C)   Resp 15   Ht _0  (1.956 m)   Wt 77.1 kg   SpO2 98%   BMI 20.16 kg/m      PHYSICAL EXAM    GENERAL:NAD, no fevers, chills, no weakness no fatigue HEAD: Normocephalic, atraumatic.  EYES: Pupils equal, round, reactive to light. Extraocular muscles intact. No scleral icterus.  MOUTH: Moist mucosal membrane. Dentition intact. No abscess noted.  EAR, NOSE, THROAT: Clear without exudates. No external lesions.  NECK: Supple. No thyromegaly. No nodules. No JVD.  PULMONARY: decreased breath sounds with mild rhonchi worse at bases bilaterally.  CARDIOVASCULAR: S1 and S2. Regular rate and rhythm. No murmurs, rubs, or gallops. No edema. Pedal pulses 2+  bilaterally.  GASTROINTESTINAL: Soft, nontender, nondistended. No masses. Positive bowel sounds. No hepatosplenomegaly.  MUSCULOSKELETAL: No swelling, clubbing, or edema. Range of motion full in all extremities.  NEUROLOGIC: Cranial nerves II through XII are intact. No gross focal neurological deficits. Sensation intact. Reflexes intact.  SKIN: No ulceration, lesions, rashes, or cyanosis. Skin warm and dry. Turgor intact.  PSYCHIATRIC: Mood, affect within normal limits. The patient is awake, alert and oriented x 3. Insight, judgment intact.       IMAGING     Narrative & Impression  CLINICAL DATA:  Esophageal perforation, progressive hypoxia, cough, chest pain, dyspnea.   EXAM: CT CHEST WITH CONTRAST   TECHNIQUE: Multidetector CT imaging of the chest was performed during intravenous contrast administration.   RADIATION DOSE REDUCTION: This exam was performed according to the departmental dose-optimization program which includes automated exposure control, adjustment of the mA and/or kV according to patient size and/or use of iterative reconstruction technique.   CONTRAST:  16m OMNIPAQUE IOHEXOL 300 MG/ML  SOLN   COMPARISON:  01/29/2022, 09/01/2021   FINDINGS: Cardiovascular: No significant coronary artery calcification. Global cardiac size within normal limits. No pericardial effusion. Central pulmonary arteries are enlarged, similar to prior examination, in keeping with changes of pulmonary arterial hypertension. The thoracic aorta is unremarkable.   Mediastinum/Nodes: Pathologic thoracic adenopathy predominantly within the aorta pulmonary window appears stable since prior examination with the index lymph nodes measuring 12-15 mm in short axis diameter. Visualized thyroid is unremarkable. The esophagus is unremarkable. No pneumomediastinum.   Lungs/Pleura: Mild paraseptal emphysema. Bibasilar pulmonary consolidative infiltrates with associated volume loss appears  stable since prior examination. Numerous superimposed cysts are identified, similar to prior examination, as well as scattered inflammatory appearing centrilobular nodules and, together, the findings raise the question of an underlying interstitial lung disease such as lymphocytic interstitial pneumonia. Necrotizing infection, however, could appear similarly, as can be seen with recurrent aspiration. No pneumothorax. Small bilateral pleural effusions are again identified. No central  obstructing lesion.   Upper Abdomen: No acute abnormality.   Musculoskeletal: No chest wall abnormality. No acute or significant osseous findings.   IMPRESSION: 1. Stable bibasilar pulmonary consolidative infiltrates with associated volume loss. Numerous superimposed cysts are similar to prior examination, as well as scattered inflammatory appearing centrilobular nodules and, together, the findings raise the question of an underlying interstitial lung disease such as lymphocytic interstitial pneumonia. Necrotizing infection, however, could appear similarly, as can be seen with recurrent aspiration. 2. Stable small bilateral pleural effusions. 3. Mild paraseptal emphysema. 4. Stable mediastinal adenopathy, possibly reactive or inflammatory. 5. Stable enlargement of the central pulmonary arteries in keeping with changes of pulmonary arterial hypertension.     ASSESSMENT/PLAN   Acute on hypoxemic respiratory failure - present on admission  - COVID19 negative  - supplemental O2 during my evaluation 4L/min - will perform infectious workup for pneumonia -Respiratory viral panel in process -serum fungitell -legionella ab -strep pneumoniae ur AG -Histoplasma Ur Ag -sputum resp cultures -AFB sputum expectorated specimen -sputum cytology  -reviewed pertinent imaging with patient today - ESR/CRP/RF -PT/OT for d/c planning  -please encourage patient to use incentive spirometer few times each hour  while hospitalized.       Esophageal perforation with PEG status -Patient is status post GI and ENT evaluation, states that he has been cleared to have p.o. fluids and ulcer is well-healing after most recent antimicrobials.  He stopped using his PEG tube and was eating normally p.o. this point.  His most recent swallow evaluation was abnormal he was recommended to have no oral nourishment.   Rheumatoid arthritis with acute exacerbation of interstitial lung disease -This appears to have occurred after cessation of Humira.  Patient has finally been able to wean down on prednisone therapy down to 10 mg for the first time in over a year however after he was recommended to DC Humira he reports progressive decline over period of 1 to 2 weeks with increased O2 requirement and resultant hospitalization.  I have restarted Solu-Medrol at this time.  Patient is able to speak in full sentences and is not having labored breathing and is not in distress during my evaluation. -Agree with continuing Plaquenil -Patient recently had ophthalmology evaluation and it was recommended for him to continue this medication.  Thank you for allowing me to participate in the care of this patient.   Patient/Family are satisfied with care plan and all questions have been answered.    Provider disclosure: Patient with at least one acute or chronic illness or injury that poses a threat to life or bodily function and is being managed actively during this encounter.  All of the below services have been performed independently by signing provider:  review of prior documentation from internal and or external health records.  Review of previous and current lab results.  Interview and comprehensive assessment during patient visit today. Review of current and previous chest radiographs/CT scans. Discussion of management and test interpretation with health care team and patient/family.   This document was prepared using Dragon voice  recognition software and may include unintentional dictation errors.     Ottie Glazier, M.D.  Division of Pulmonary & Critical Care Medicine

## 2022-02-07 NOTE — H&P (Signed)
History and Physical    Patient: Sean [name] GEZ:[id number] DOB: 11-Jul-1975 DOA: 02/06/2022 DOS: the patient was seen and examined on 02/07/2022 PCP: Casilda Carls, MD  Patient coming from: Home  Chief Complaint:  Chief Complaint  Patient presents with   Shortness of Breath    HPI: [name] is a 46 y.o. 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  O2 sats at home was as low as 74% on room air.  Patient wears his oxygen only as needed but now has to wear it continuously.  He denies fever or chills and has an occasional cough productive of white phlegm.  Patient who has PEG tube has been cleared for swallowing and is able to eat and denies any recent choking episodes.  Patient was undergoing extensive diagnostic evaluation for connective tissue disease at [facility] where he established care a few days ago on 10/26. ED course and data review: BP 164/150 with pulse 105, respirations 21-27 and O2 sat 88% on 3 L improving to mid 90s on 6 L.  Afebrile.  Labs including CBC, CMP, troponin, procalcitonin and BNP all unremarkable and COVID negative.  EKG, personally viewed and interpreted showing sinus tachycardia at 100 with no acute ST-T wave changes.  CT chest with contrast shows stable bibasilar pulmonary consolidative infiltrates with further details as follows: IMPRESSION: 1. Stable bibasilar pulmonary consolidative infiltrates with associated volume loss. Numerous superimposed cysts are similar to prior examination, as well as scattered inflammatory appearing centrilobular nodules  and, together, the findings raise the question of an underlying interstitial lung disease such as lymphocytic interstitial pneumonia. Necrotizing infection, however, could appear similarly, as can be seen with recurrent aspiration. 2. Stable small bilateral pleural effusions. 3. Mild paraseptal emphysema. 4. Stable mediastinal adenopathy, possibly reactive or inflammatory. 5. Stable enlargement of the central pulmonary arteries in keeping with changes of pulmonary arterial hypertension.    Patient started on cefepime and azithromycin and given a DuoNeb and hospitalist consulted for admission.   Review of Systems: As mentioned in the history of present illness. All other systems reviewed and are negative.  Past Medical History:  Diagnosis Date   H/O immunosuppressive therapy    Hyperpigmentation of skin    Hyperpigmentation rash/vesicles of the palm and face   Neutropenia ([place])    RA (rheumatoid arthritis) ([place])    Past Surgical History:  Procedure Laterality Date   ESOPHAGOGASTRODUODENOSCOPY N/A 09/03/2021   Procedure: ESOPHAGOGASTRODUODENOSCOPY (EGD);  Surgeon: Lesly Rubenstein, MD;  Location: [facility] ENDOSCOPY;  Service: Endoscopy;  Laterality: N/A;   IR GASTROSTOMY TUBE MOD SED  09/11/2021   Social History:  reports that he has quit smoking. His smoking use included cigarettes. He has never used smokeless tobacco. He reports that he does not currently use alcohol. He reports that he does not currently use drugs after having used the following drugs: Marijuana.  No Known Allergies  Family History  Problem Relation Age of Onset   Hyperlipidemia Mother    Hypertension Father     Prior to Admission medications   Medication Sig Start Date End Date Taking? Authorizing Provider  albuterol (VENTOLIN HFA) 108 (90 Base) MCG/ACT  inhaler Inhale 2 puffs into the lungs every 6 (six) hours as needed for wheezing or shortness of breath. 09/13/21   Mercy Riding, MD  blood glucose meter kit  and supplies KIT Dispense based on patient and insurance preference. Use up to four times daily as directed. 09/14/21   Wouk, Ailene Rud, MD  blood glucose meter kit and supplies Dispense based on patient and insurance preference. Use up to four times daily as directed. (FOR ICD-10 E10.9, E11.9). 09/13/21   Mercy Riding, MD  Blood Glucose Monitoring Suppl (CONTOUR NEXT ONE) KIT 4 (four) times daily. 10/16/21   [provider]  BREZTRI AEROSPHERE 160-9-4.8 MCG/ACT AERO Inhale 2 puffs into the lungs 2 (two) times daily. 01/31/22   Emeterio Reeve, DO  chlorhexidine (PERIDEX) 0.12 % solution 15 mLs 2 (two) times daily. 01/28/22   [provider]  clobetasol ointment (TEMOVATE) 0.05 % Apply topically. 01/28/22   [provider]  fluconazole (DIFLUCAN) 100 MG tablet Take 1 tablet (100 mg total) by mouth daily for 10 days. 02/01/22 02/11/22  Emeterio Reeve, DO  folic acid (FOLVITE) 1 MG tablet Place 1 tablet (1 mg total) into feeding tube daily. Patient not taking: Reported on 01/29/2022 09/13/21   Mercy Riding, MD  HUMIRA PEN 40 MG/0.4ML PNKT SMARTSIG:1 pre-filled pen syringe SUB-Q Every 2 Weeks 10/16/21   [provider]  hydroxychloroquine (PLAQUENIL) 200 MG tablet Place 1 tablet (200 mg total) into feeding tube 2 (two) times daily. 09/13/21   Mercy Riding, MD  insulin aspart (NOVOLOG) 100 UNIT/ML FlexPen Inject 1-9 Units into the skin every 6 (six) hours. CBG 70 - 120: 0 units CBG 121 - 150: 1 unit CBG 151 - 200: 2 units CBG 201 - 250: 3 units CBG 251 - 300: 5 units CBG 301 - 350: 7 units CBG 351 - 400: 9 units CBG > 400: call MD and obtain STAT lab verification 09/14/21   Wouk, Ailene Rud, MD  Insulin Pen Needle (PEN NEEDLES 3/16") 31G X 5 MM MISC 1 pen. by Does not apply route every 6 (six) hours. 09/14/21   Wouk, Ailene Rud, MD  lidocaine (XYLOCAINE) 2 % solution Use as directed 15 mLs in the mouth or throat every 6 (six) hours as needed (mouth/throat pain). 01/31/22    Emeterio Reeve, DO  mycophenolate (CELLCEPT) 500 MG tablet Place 1 tablet (500 mg total) into feeding tube every 12 (twelve) hours. 09/13/21   Mercy Riding, MD  NONFORMULARY OR COMPOUNDED ITEM LIDOCAINE/MAALOX XS 1:1 01/05/22   [provider]  pantoprazole (PROTONIX) 40 MG tablet Take 1 tablet (40 mg total) by mouth daily. 01/31/22   Emeterio Reeve, DO  polyethylene glycol (MIRALAX / GLYCOLAX) 17 g packet Take 17 g by mouth daily as needed for mild constipation or moderate constipation. 01/31/22   Emeterio Reeve, DO  predniSONE (DELTASONE) 5 MG tablet Take 10 mg by mouth daily. 01/07/22   [provider]  sulfamethoxazole-trimethoprim (BACTRIM) 400-80 MG tablet Place 1 tablet into feeding tube 3 (three) times a week. 09/14/21   Mercy Riding, MD  tacrolimus (PROTOPIC) 0.1 % ointment Apply topically 2 (two) times daily. 01/28/22   [provider]  triamcinolone cream (KENALOG) 0.1 % Apply topically 2 (two) times daily. 01/28/22   [provider]  Vitamin D 12.5 MCG/0.25ML LIQD Give 1 mL by tube daily. 09/13/21   Mercy Riding, MD    Physical Exam: Vitals:   02/06/22 2245 02/06/22 2300 02/06/22  2330 02/06/22 2345  BP:  109/69 105/66   Pulse: (!) 105 (!) 114 (!) 108 (!) 104  Resp: (!) 27 (!) 27 (!) 21 (!) 22  Temp:      TempSrc:      SpO2: 92% 90% 92% 93%  Weight:      Height:       Physical Exam Vitals and nursing note reviewed.  Constitutional:      General: He is not in acute distress. HENT:     Head: Normocephalic and atraumatic.  Cardiovascular:     Rate and Rhythm: Regular rhythm. Tachycardia present.     Heart sounds: Normal heart sounds.  Pulmonary:     Effort: Pulmonary effort is normal. Tachypnea present.     Breath sounds: Wheezing and rhonchi present.  Abdominal:     Palpations: Abdomen is soft.     Tenderness: There is no abdominal tenderness.  Skin:    Comments: Redness of fingertips and palmar surfaces of fingers   Neurological:     Mental Status: Mental status is at baseline.     Labs on Admission: I have personally reviewed following labs and imaging studies  CBC: Recent Labs  Lab 01/31/22 0511 02/06/22 1954  WBC 4.6 4.7  NEUTROABS  --  3.3  HGB 14.4 14.6  HCT 43.4 44.6  MCV 93.1 94.9  PLT 232 235   Basic Metabolic Panel: Recent Labs  Lab 01/31/22 0511 02/06/22 1954  NA 138 139  K 4.4 4.4  CL 107 108  CO2 26 24  GLUCOSE 72 86  BUN 11 16  CREATININE 1.04 0.94  CALCIUM 8.8* 9.1   GFR: Estimated Creatinine Clearance: 109.4 mL/min (by C-G formula based on SCr of 0.94 mg/dL). Liver Function Tests: Recent Labs  Lab 02/06/22 1954  AST 46*  ALT 43  ALKPHOS 46  BILITOT 0.5  PROT 8.6*  ALBUMIN 3.3*   No results for input(s): "LIPASE", "AMYLASE" in the last 168 hours. No results for input(s): "AMMONIA" in the last 168 hours. Coagulation Profile: No results for input(s): "INR", "PROTIME" in the last 168 hours. Cardiac Enzymes: No results for input(s): "CKTOTAL", "CKMB", "CKMBINDEX", "TROPONINI" in the last 168 hours. BNP (last 3 results) No results for input(s): "PROBNP" in the last 8760 hours. HbA1C: No results for input(s): "HGBA1C" in the last 72 hours. CBG: Recent Labs  Lab 01/31/22 0453 01/31/22 0919  GLUCAP 70 82   Lipid Profile: No results for input(s): "CHOL", "HDL", "LDLCALC", "TRIG", "CHOLHDL", "LDLDIRECT" in the last 72 hours. Thyroid Function Tests: No results for input(s): "TSH", "T4TOTAL", "FREET4", "T3FREE", "THYROIDAB" in the last 72 hours. Anemia Panel: No results for input(s): "VITAMINB12", "FOLATE", "FERRITIN", "TIBC", "IRON", "RETICCTPCT" in the last 72 hours. Urine analysis: No results found for: "COLORURINE", "APPEARANCEUR", "LABSPEC", "PHURINE", "GLUCOSEU", "HGBUR", "BILIRUBINUR", "KETONESUR", "PROTEINUR", "UROBILINOGEN", "NITRITE", "LEUKOCYTESUR"  Radiological Exams on Admission: CT Chest W Contrast  Result Date: 02/06/2022 CLINICAL  DATA:  Esophageal perforation, progressive hypoxia, cough, chest pain, dyspnea. EXAM: CT CHEST WITH CONTRAST TECHNIQUE: Multidetector CT imaging of the chest was performed during intravenous contrast administration. RADIATION DOSE REDUCTION: This exam was performed according to the departmental dose-optimization program which includes automated exposure control, adjustment of the mA and/or kV according to patient size and/or use of iterative reconstruction technique. CONTRAST:  41m OMNIPAQUE IOHEXOL 300 MG/ML  SOLN COMPARISON:  01/29/2022, 09/01/2021 FINDINGS: Cardiovascular: No significant coronary artery calcification. Global cardiac size within normal limits. No pericardial effusion. Central pulmonary arteries are enlarged, similar to prior examination,  in keeping with changes of pulmonary arterial hypertension. The thoracic aorta is unremarkable. Mediastinum/Nodes: Pathologic thoracic adenopathy predominantly within the aorta pulmonary window appears stable since prior examination with the index lymph nodes measuring 12-15 mm in short axis diameter. Visualized thyroid is unremarkable. The esophagus is unremarkable. No pneumomediastinum. Lungs/Pleura: Mild paraseptal emphysema. Bibasilar pulmonary consolidative infiltrates with associated volume loss appears stable since prior examination. Numerous superimposed cysts are identified, similar to prior examination, as well as scattered inflammatory appearing centrilobular nodules and, together, the findings raise the question of an underlying interstitial lung disease such as lymphocytic interstitial pneumonia. Necrotizing infection, however, could appear similarly, as can be seen with recurrent aspiration. No pneumothorax. Small bilateral pleural effusions are again identified. No central obstructing lesion. Upper Abdomen: No acute abnormality. Musculoskeletal: No chest wall abnormality. No acute or significant osseous findings. IMPRESSION: 1. Stable bibasilar  pulmonary consolidative infiltrates with associated volume loss. Numerous superimposed cysts are similar to prior examination, as well as scattered inflammatory appearing centrilobular nodules and, together, the findings raise the question of an underlying interstitial lung disease such as lymphocytic interstitial pneumonia. Necrotizing infection, however, could appear similarly, as can be seen with recurrent aspiration. 2. Stable small bilateral pleural effusions. 3. Mild paraseptal emphysema. 4. Stable mediastinal adenopathy, possibly reactive or inflammatory. 5. Stable enlargement of the central pulmonary arteries in keeping with changes of pulmonary arterial hypertension. Emphysema (ICD10-J43.9). Electronically Signed   By: Fidela Salisbury M.D.   On: 02/06/2022 21:58   DG Chest Portable 1 View  Result Date: 02/06/2022 CLINICAL DATA:  Shortness of breath EXAM: PORTABLE CHEST 1 VIEW COMPARISON:  01/31/2022 FINDINGS: Bilateral mid and lower lobe airspace opacities with small bilateral effusions, similar prior study. Heart is normal size. Mediastinal contours within normal limits. No acute bony abnormality. IMPRESSION: Stable bilateral lower lobe infiltrates with small effusions. Electronically Signed   By: Rolm Baptise M.D.   On: 02/06/2022 20:25     Data Reviewed: Relevant notes from primary care and specialist visits, past discharge summaries as available in EHR, including Care Everywhere. Prior diagnostic testing as pertinent to current admission diagnoses Updated medications and problem lists for reconciliation ED course, including vitals, labs, imaging, treatment and response to treatment Triage notes, nursing and pharmacy notes and ED provider's notes Notable results as noted in HPI   Assessment and Plan: * Multifocal pneumonia Acute on chronic respiratory failure with hypoxia Interstitial lung disease At high risk of infection due to long-term immunosuppressant use Patient was recently  treated for CAP from 10/20 to 10/27 WBC procalcitonin and lactic acid normal but could be related to recent antibiotics as well as immunosuppressants Empiric antibiotics with cefepime azithromycin and vancomycin DuoNebs as needed Supplemental oxygen and wean to baseline as tolerated Consider pulmonology and possibly ID consult in the a.m. 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 until cleared by speech IV hydration Still has feeding tube        DVT prophylaxis: Lovenox  Consults: none  Advance Care Planning:   Code  Status: Prior   Family Communication: none  Disposition Plan: Back to previous home environment  Severity of Illness: The appropriate patient status for this patient is INPATIENT. Inpatient status is judged to be reasonable and necessary in order to provide the required intensity of service to ensure the patient's safety. The patient's presenting symptoms, physical exam findings, and initial radiographic and laboratory data in the context of their chronic comorbidities is felt to place them at high risk for further clinical deterioration. Furthermore, it is not anticipated that the patient will be medically stable for discharge from the hospital within 2 midnights of admission.   * I certify that at the point of admission it is my clinical judgment that the patient will require  inpatient hospital care spanning beyond 2 midnights from the point of admission due to high intensity of service, high risk for further deterioration and high frequency of surveillance required.*  Author: Athena Masse, MD 02/07/2022 12:59 AM  For on call review [email or web address].

## 2022-02-07 NOTE — Assessment & Plan Note (Addendum)
We will keep n.p.o. until cleared by speech IV hydration Still has feeding tube

## 2022-02-07 NOTE — Plan of Care (Signed)
Patient was seen and examined at bedside.  Patient was admitted last night due to acute hypoxic tori failure secondary to recurrent pneumonia, patient is immunocompromised has history of scleroderma as per patient recently diagnosed by rheumatologist at [facility].  Continue current antibiotics, pulmonary consulted.  Wean oxygen as per improvement.  Currently patient is on 4 L oxygen.

## 2022-02-07 NOTE — Assessment & Plan Note (Deleted)
Patient currently on Humira, Plaquenil and Deltasone Will hold Humira

## 2022-02-07 NOTE — Progress Notes (Addendum)
Pharmacy Antibiotic Note  Sean [name] is a 46 y.o. male admitted on 02/06/2022 with pneumonia. PMH interstitial lung disease and RA. Recent admission received IV antibiotics. Pharmacy has been consulted for vancomycin dosing.  Renal function consistent with baseline.  Plan: Vancomycin 2,000 mg loading dose  Vancomycin 1500 mg every 12 hours Goal AUC 400-600 Estimated AUC 494, Cmin 11.7 Vd 0.72, IBW  Also on cefepime 2 grams every 8 hours (CrCl > 60) and azithromycin 500 mg every 24 hours  Follow renal function, clinical course, length of therapy  Height: 6\' 5"  (195.6 cm) Weight: 77.1 kg (170 lb) IBW/kg (Calculated) : 89.1  Temp (24hrs), Avg:98.2 F (36.8 C), Min:98.2 F (36.8 C), Max:98.2 F (36.8 C)  Recent Labs  Lab 01/31/22 0511 02/06/22 1954  WBC 4.6 4.7  CREATININE 1.04 0.94    Estimated Creatinine Clearance: 109.4 mL/min (by C-G formula based on SCr of 0.94 mg/dL).    No Known Allergies  Antimicrobials this admission: vancomycin 10/29 >>  cefepime 10/29 >>   Dose adjustments this admission: N/a  Microbiology results: 10/29 BCx: ordered 10/29 sputum: ordered  10/29 MRSA PCR: ordered  Thank you for allowing pharmacy to be a part of this patient's care.  Wynelle Cleveland 02/07/2022 1:43 AM

## 2022-02-07 NOTE — Evaluation (Signed)
Clinical/Bedside Swallow Evaluation Patient Details  Name: Sean [name] MRN: [id number] Date of Birth: 1976/02/27  Today's Date: 02/07/2022 Time: SLP Start Time (ACUTE ONLY): 1505 SLP Stop Time (ACUTE ONLY): 1530 SLP Time Calculation (min) (ACUTE ONLY): 25 min  Past Medical History:  Past Medical History:  Diagnosis Date   H/O immunosuppressive therapy    Hyperpigmentation of skin    Hyperpigmentation rash/vesicles of the palm and face   Neutropenia (HCC)    RA (rheumatoid arthritis) (HCC)    Past Surgical History:  Past Surgical History:  Procedure Laterality Date   ESOPHAGOGASTRODUODENOSCOPY N/A 09/03/2021   Procedure: ESOPHAGOGASTRODUODENOSCOPY (EGD);  Surgeon: Regis Bill, MD;  [facility] END[facility]COPY;  Service: Endoscopy;  Laterality: N/A;   IR GASTROSTOMY TUBE MOD SED  09/11/2021   HPI:  Per H&P: Sean [name] is a 46 y.o. 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  O2 sats at home was as low as 74% on room air.  Patient wears his oxygen only as needed but now has to wear it continuously.  He denies fever or chills and has an occasional cough productive of white phlegm.  Patient who has PEG tube has been cleared for swallowing and is able to eat and denies any recent choking episodes.  Patient was undergoing extensive diagnostic evaluation for connective tissue disease at [facility] where he established care a few days ago on 10/26.   Pt is currently on 4L nasal canula (baseline 3L at home per pt) and on a regular solids and thin liquids diet. Father and sister present  for assessment.  Chest CT 02/06/22: 1. Stable bibasilar pulmonary consolidative infiltrates with associated volume loss. Numerous superimposed cysts are similar to prior examination, as well as scattered inflammatory appearing centrilobular nodules and, together, the findings raise the question of an underlying interstitial lung disease such as lymphocytic interstitial pneumonia. Necrotizing infection, however, could appear similarly, as can be seen with recurrent aspiration. Stable small bilateral pleural effusions. Mild paraseptal emphysema. Stable mediastinal adenopathy, possibly reactive or inflammatory. Stable enlargement of the central pulmonary arteries in keeping with changes of pulmonary arterial hypertension.    Assessment / Plan / Recommendation  Clinical Impression  Pt with hx of dysphagia and esophageal complications, with most recent MBSS completed 01/07/22 at [facility] revealing, "moderate (safe, but severely inefficient) oropharyngeal swallow in setting of known prevertebral space defect and epiglottic ulcer. Suspect pain causing inefficiency. Dysphagia is characterized by piecemeal swallows with more than 50% of bolus remaining in oral cavity after the first swallow; however completely clears after about 3 swallows across consistencies. Epiglottis does not completely invert as it rest on the pharyngeal wall. No penetration or aspiration. Contrast did not clear out of prevertebral space." Per ENT note on 01/22/22: "Recent MBS and CT reveal persistent UADT perforation. We discussed various options at this point. He looks well today, no fevers, no resting pain, resolved epiglottic ulcer, tolerating oral diet, and we discussed how based on his imaging, we could have frankly expected him to look far worse than he looks today. We discussed that there is a real possibility that surgical intervention to try to surgically repair a  perforation could make his situation worse. Alternatively, should he elect for  observation, it is also impossible to predict if he will get clinically worse again given wound healing concerns with him being on immunosuppressive agents. We had a discussion regarding range of options including observation, to repeat EGD with dilation, HBO, surgical repair, with pros and cons discussed with each, but ultimately this is an ongoing conversation and depends on his own personal preferences as there is no clearly definitive recommendation or necessity for surgery at this time."   Pt seen this date with nasal canula in place, alert and agreeable to assessment. O2 saturations maintained at 94-99 for duration of evaluation. Pt seen with trials of thin liquids, pureed solids, and regular solids. No overt or subtle s/sx pharyngeal dysphagia noted. No change to vocal quality across trials. Vitals stable for duration of trials. Oral phase grossly unremarkable. Pt endorsed intermittent "tightness" when swallowing, but denied gross issue with PO intake (reported 2 meals a day).       Given known pharyngeal and esophageal impairments, pt is at moderate risk for aspiration; therefore, recommend strict aspiration precautions, including slow rate, small bites, elevated HOB, and alert for PO intake. Recommend continued regular solids and thin liquids with stated aspiration precautions. Education shared regarding aspiration precautions and rationale for risk for aspiration. Pt and family receptive to education, with pt endorsing compliance in home setting. Given relative recency of last MBSS and pt reported gross tolerance of PO in the interim- no immediate repeat MBSS indicated. SLP to monitor for progress and follow up for continued aspiration precautions. MD and RN aware and in agreement with plan. SLP Visit Diagnosis: Dysphagia, pharyngoesophageal phase (R13.14)    Aspiration Risk  Moderate aspiration risk    Diet Recommendation   Regular solids and thin liquids   Medication Administration: Whole  meds with liquid    Other  Recommendations Oral Care Recommendations: Oral care BID    Recommendations for follow up therapy are one component of a multi-disciplinary discharge planning process, led by the attending physician.  Recommendations may be updated based on patient status, additional functional criteria and insurance authorization.  Follow up Recommendations Outpatient SLP      Assistance Recommended at Discharge Intermittent Supervision/Assistance  Functional Status Assessment Patient has had a recent decline in their functional status and demonstrates the ability to make significant improvements in function in a reasonable and predictable amount of time.  Frequency and Duration min 2x/week  1 week       Prognosis Prognosis for Safe Diet Advancement: Good      Swallow Study   General Date of Onset: 02/07/22 HPI: Per H&P: Sean [name] is a 46 y.o. 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  O2 sats at home was as low as 74% on room air.  Patient wears his oxygen only as needed but now has to wear it continuously.  He denies fever or chills and has an occasional cough productive of white phlegm.  Patient who has PEG tube has been cleared for swallowing and is able to eat and denies any recent choking episodes.  Patient was undergoing extensive  diagnostic evaluation for connective tissue disease at [facility] where he established care a few days ago on 10/26.  Pt is currently on 4L nasal canula (baseline 3L at home per pt) and on a regular solids and thin liquids diet. Father and  sister present for assessment. Chest CT 02/06/22: 1. Stable bibasilar pulmonary consolidative infiltrates with associated volume loss. Numerous superimposed cysts are similar to prior examination, as well as scattered inflammatory appearing centrilobular nodules and, together, the findings raise the question of an underlying interstitial lung disease such as lymphocytic interstitial pneumonia. Necrotizing infection, however, could appear similarly, as can be seen with recurrent aspiration. Stable small bilateral pleural effusions. Mild paraseptal emphysema. Stable mediastinal adenopathy, possibly reactive or inflammatory. Stable enlargement of the central pulmonary arteries in keeping with changes of pulmonary arterial hypertension. Type of Study: Bedside Swallow Evaluation Previous Swallow Assessment: last MBSS (at [facility]) completed on 01/07/22 Diet Prior to this Study: Regular;Thin liquids Temperature Spikes Noted: No (WBC 4.0) Respiratory Status: Nasal cannula History of Recent Intubation: No Behavior/Cognition: Alert;Cooperative;Pleasant mood Oral Cavity Assessment: Within Functional Limits Oral Care Completed by SLP: No Oral Cavity - Dentition: Adequate natural dentition Vision: Functional for self-feeding Self-Feeding Abilities: Able to feed self Patient Positioning: Upright in bed Baseline Vocal Quality: Normal Volitional Cough: Strong Volitional Swallow: Able to elicit    Oral/Motor/Sensory Function Overall Oral Motor/Sensory Function: Within functional limits   Ice Chips Ice chips: Within functional limits   Thin Liquid Thin Liquid: Not tested    Nectar Thick Nectar Thick Liquid: Not tested   Honey Thick Honey Thick Liquid: Within functional limits   Puree Puree: Within functional limits   Solid  Sean Joshue Badal Clapp  MS [facility] SLP  Solid: Within functional limits      Sean [name] 02/07/2022,5:25 PM

## 2022-02-07 NOTE — Assessment & Plan Note (Addendum)
Patient was seen by [facility] rheumatology on 10/26 and underwent extensive evaluation to determine nature of his connective tissue disease Until recently being treated as RA with pulmonary involvement (note reviewed) Patient complains of pain and has ongoing myalgia and arthralgia Pain control Currently on mycophenolate, hydroxychloroquine and prednisone taper Patient stated Humira was discontinued by [name] at that visit on 10/26 Holding immunosuppressants for now given suspicion of pneumonia

## 2022-02-07 NOTE — Progress Notes (Signed)
Pharmacy Antibiotic Note  Sean [name] is a 46 y.o. male admitted on 02/06/2022 with pneumonia. PMH interstitial lung disease and RA. Recent admission received IV antibiotics. Pharmacy has been consulted for vancomycin dosing.  Renal function consistent with baseline.  Plan: Pt received Vancomycin 2,000 mg loading dose. Will give vancomycin 1250 mg q12H. Predicted AUC 458. Goal AUC 400-550. Scr 0.91, TBW, Vd 0.72. Plan to get vancomycin level after 4th or 5th dose. MRSA PCR ordered if negative recommended discontinuing vancomycin.   Continue cefepime 2 g q8H and azithromycin 500 mg every 24 hours  Follow renal function, clinical course, length of therapy  Height: 6\' 5"  (195.6 cm) Weight: 77.1 kg (170 lb) IBW/kg (Calculated) : 89.1  Temp (24hrs), Avg:98.6 F (37 C), Min:98.2 F (36.8 C), Max:98.8 F (37.1 C)  Recent Labs  Lab 02/06/22 1954 02/07/22 0455  WBC 4.7 4.0  CREATININE 0.94 0.91     Estimated Creatinine Clearance: 113 mL/min (by C-G formula based on SCr of 0.91 mg/dL).    No Known Allergies  Antimicrobials this admission: vancomycin 10/29 >>  cefepime 10/29 >>   Dose adjustments this admission: N/a  Microbiology results: 10/29 BCx: ordered 10/29 sputum: ordered  10/29 MRSA PCR: ordered  Thank you for allowing pharmacy to be a part of this patient's care.  Oswald Hillock, PharmD 02/07/2022 11:07 AM

## 2022-02-07 NOTE — Assessment & Plan Note (Addendum)
Patient was recently treated for CAP from 10/20 to 10/27 WBC procalcitonin and lactic acid normal but could be related to recent antibiotics as well as immunosuppressants Switch antibiotic to oral Augmentin possible discharge tomorrow DuoNebs as needed Appreciate pulmonary input

## 2022-02-08 DIAGNOSIS — M359 Systemic involvement of connective tissue, unspecified: Secondary | ICD-10-CM | POA: Diagnosis not present

## 2022-02-08 DIAGNOSIS — J9621 Acute and chronic respiratory failure with hypoxia: Secondary | ICD-10-CM | POA: Diagnosis not present

## 2022-02-08 DIAGNOSIS — M0579 Rheumatoid arthritis with rheumatoid factor of multiple sites without organ or systems involvement: Secondary | ICD-10-CM

## 2022-02-08 DIAGNOSIS — J189 Pneumonia, unspecified organism: Secondary | ICD-10-CM | POA: Diagnosis not present

## 2022-02-08 LAB — BASIC METABOLIC PANEL
Anion gap: 7 (ref 5–15)
BUN: 11 mg/dL (ref 6–20)
CO2: 22 mmol/L (ref 22–32)
Calcium: 8.8 mg/dL — ABNORMAL LOW (ref 8.9–10.3)
Chloride: 109 mmol/L (ref 98–111)
Creatinine, Ser: 0.89 mg/dL (ref 0.61–1.24)
GFR, Estimated: >60 mL/min (ref >60)
Glucose, Bld: 100 mg/dL — ABNORMAL HIGH (ref 70–99)
Potassium: 4.7 mmol/L (ref 3.5–5.1)
Sodium: 138 mmol/L (ref 135–145)

## 2022-02-08 LAB — CBC
HCT: 39.1 % (ref 39.0–52.0)
Hemoglobin: 12.8 g/dL — ABNORMAL LOW (ref 13.0–17.0)
MCH: 30.1 pg (ref 26.0–34.0)
MCHC: 32.7 g/dL (ref 30.0–36.0)
MCV: 92 fL (ref 80.0–100.0)
Platelets: 245 10*3/uL (ref 150–400)
RBC: 4.25 MIL/uL (ref 4.22–5.81)
RDW: 12.9 % (ref 11.5–15.5)
WBC: 4 10*3/uL (ref 4.0–10.5)
nRBC: 0 % (ref 0.0–0.2)

## 2022-02-08 LAB — PROCALCITONIN
Procalcitonin: <0.1 ng/mL
Procalcitonin: <0.1 ng/mL

## 2022-02-08 LAB — SEDIMENTATION RATE: Sed Rate: 79 mm/hr — ABNORMAL HIGH (ref 0–15)

## 2022-02-08 LAB — MAGNESIUM: Magnesium: 1.8 mg/dL (ref 1.7–2.4)

## 2022-02-08 LAB — PHOSPHORUS: Phosphorus: 4.4 mg/dL (ref 2.5–4.6)

## 2022-02-08 LAB — C-REACTIVE PROTEIN: CRP: 2.9 mg/dL — ABNORMAL HIGH

## 2022-02-08 MED ORDER — TRAMADOL HCL 50 MG PO TABS
50.0000 mg | ORAL_TABLET | Freq: Four times a day (QID) | ORAL | Status: DC | PRN
  Administered 2022-02-08 – 2022-02-12 (×7): 50 mg via ORAL
  Filled 2022-02-08 (×8): qty 1

## 2022-02-08 MED ORDER — AMOXICILLIN-POT CLAVULANATE 875-125 MG PO TABS
1.0000 | ORAL_TABLET | Freq: Two times a day (BID) | ORAL | Status: DC
  Administered 2022-02-09 – 2022-02-12 (×7): 1 via ORAL
  Filled 2022-02-08 (×7): qty 1

## 2022-02-08 NOTE — Progress Notes (Signed)
Pt refused Lovenox SQ; pt is ambulatory. Will notify MD.

## 2022-02-08 NOTE — Progress Notes (Signed)
  Progress Note   Patient: Sean [name] GBM:[id number] DOB: 11-23-1975 DOA: 02/06/2022     1 DOS: the patient was seen and examined on 02/08/2022   Brief hospital course: 46 y.o. 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  Readmitted for acute hypoxic respiratory failure likely due to pneumonia  10/29: Pulmonary consult, currently on 4 L oxygen 10/30: Weaned off to room air   Assessment and Plan: * Multifocal pneumonia Patient was recently treated for CAP from 10/20 to 10/27 WBC procalcitonin and lactic acid normal but could be related to recent antibiotics as well as immunosuppressants Switch antibiotic to oral Augmentin possible discharge tomorrow DuoNebs as needed Appreciate pulmonary input  Acute on chronic respiratory failure with hypoxia ([place]) Initially required 4 L oxygen.  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  Continue Plaquenil.  Responded well to steroid  History of esophageal perforation Eating orally        Subjective: Feels much better.  On room air and not feeling short of breath.  Girlfriend at bedside  Physical Exam: Vitals:   02/08/22 0009 02/08/22 0545 02/08/22 1144 02/08/22 1615  BP: 105/65 100/66 110/79 120/80  Pulse: (!) 102 80 84 96  Resp: _0 Temp: 98.1 F (36.7 C) 97.8 F (36.6 C) 97.9 F (36.6 C) 98.1 F (36.7 C)  TempSrc:   Oral Oral  SpO2: 96% 100% 95% 93%  Weight:      Height:       46 year old male lying in the bed comfortably without any acute distress Lungs clear to auscultation bilaterally Heart regular rate and rhythm Abdomen soft, benign Neuro alert and awake, nonfocal Skin no rash or lesion Data Reviewed:  CRP 2.9, ESR 79  Family Communication: Girlfriend updated at bedside  Disposition: Status is: Inpatient Remains inpatient appropriate because: Management of pneumonia and respiratory failure   Planned Discharge Destination: Home    DVT prophylaxis-Lovenox Time spent: 35 minutes  Author: Max Sane, MD 02/08/2022 8:02 PM  For on call review [email or web address].

## 2022-02-08 NOTE — TOC Initial Note (Signed)
Transition of Care [facility]) - Initial/Assessment Note    Patient Details  Name: Sean [name] MRN: [id number] Date of Birth: 12-16-1975  Transition of Care [facility]) CM/SW Contact:    Beverly Sessions, RN Phone Number: 02/08/2022, 3:35 PM  Clinical Narrative:                       Transition of Care [facility]) Screening Note   Patient Details  Name: Sean [name] Date of Birth: 12-Mar-1976   Transition of Care [facility]) CM/SW Contact:    Beverly Sessions, RN Phone Number: 02/08/2022, 3:35 PM    Transition of Care Department [facility]) has reviewed patient and no TOC needs have been identified at this time. We will continue to monitor patient advancement through interdisciplinary progression rounds. If new patient transition needs arise, please place a TOC consult.  Confirmed with MD that no home health indicated at discharge - I have sent a messaged to [facility] to confirm what orders they have on file for patient o2   Patient Goals and CMS Choice        Expected Discharge Plan and Services                                                Prior Living Arrangements/Services                       Activities of Daily Living Home Assistive Devices/Equipment: None ADL Screening (condition at time of admission) Patient's cognitive ability adequate to safely complete daily activities?: Yes Is the patient deaf or have difficulty hearing?: No Does the patient have difficulty seeing, even when wearing glasses/contacts?: No Does the patient have difficulty concentrating, remembering, or making decisions?: No Patient able to express need for assistance with ADLs?: Yes Does the patient have difficulty dressing or bathing?: No Independently performs ADLs?: Yes (appropriate for developmental age) Does the patient have difficulty walking or climbing stairs?: No Weakness of Legs: None Weakness of Arms/Hands: None  Permission Sought/Granted                   Emotional Assessment              Admission diagnosis:  Hypoxia [R09.02] HCAP (healthcare-associated pneumonia) [J18.9] Acute on chronic respiratory failure with hypoxia (HCC) [J96.21] Dyspnea, unspecified type [R06.00] Patient Active Problem List   Diagnosis Date Noted   Acute on chronic respiratory failure with hypoxia ([place]) 02/07/2022   CAP (community acquired pneumonia) 01/29/2022   History of esophageal perforation 01/29/2022   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) 01/29/2022   Hypokalemia and hypomagnesemia 09/12/2021   Hypokalemia 09/12/2021   Hypomagnesemia 09/10/2021   Elevated liver enzymes 09/10/2021   Severe protein calorie malnutrition/unintentional weight loss 09/09/2021   Dysphagia/odynophagia 09/09/2021   Unintentional weight loss 09/09/2021   Concern for retropharyngeal abscess/posterior pharyngeal perforation    Uncontrolled NIDDM-2 with hyperglycemia 09/02/2021   Chest pain    Multifocal pneumonia 09/01/2021   ILD (interstitial lung disease) ([place]) 08/05/2021   Long-term use of immunosuppressant medication 08/05/2021   Pleural effusion 07/27/2021   Elevated LFTs 07/27/2021   Rheumatoid arthritis with pulmonary involvement 03/26/2021   PCP:  Casilda Carls, MD Pharmacy:   [facility] [place], [place] AT [facility] [place]  [place] [address] Phone: [phone] Fax: [phone]  [place], [place] [address] [place] [address] Phone: [phone] Fax: [phone]     Social Determinants of Health (SDOH) Interventions    Readmission Risk Interventions    09/14/2021   11:11 AM  Readmission Risk Prevention Plan  Transportation Screening Complete  PCP or Specialist Appt within 5-7 Days Complete  Home Care Screening Complete  Medication Review (RN CM) Complete

## 2022-02-08 NOTE — Assessment & Plan Note (Signed)
Flare could be due to cessation of Humira.  Continue Plaquenil.  Responded well to steroid 

## 2022-02-08 NOTE — Progress Notes (Signed)
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  Does have history of smoking with paraseptal emphysema with concomitant areas of likely interstitial lung disease and fibrotic changes worse at the bases bilaterally.  Additionally patient has been on multiple immunosuppressive agents including Humira, Plaquenil, steroids chronically, CellCept which do predispose him for infection.  He has been very difficult to wean from steroids and generally starts to flare underlying interstitial lung disease with worsening hypoxemia when steroids are tapered to less than 40 mg of prednisone.  He does respond fairly quickly to steroids and generally has improvement in oxygenation post glucocorticoid therapy.  Is also had recurrent effusions which have been drained via thoracentesis and have not been shown to be infected or parapneumonic/empyema.  Patient reports over past week he slowly but steadily continued to get weak and more dyspneic.  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 He has been eating orally and has not been eating via PEG tube. He denies feeling flu like illness. He has been urinating well denies dysuria or dark urine.  Having normal well formed with intermittent loose stools.   Patient was asked to stop his Humira after most recent rheumatology evaluation with [name] and reports clinical decline after skipping his last injection.  On  admission in the ED he did not have additional CT chest with findings of consolidative infiltrates with associated volume loss as well as bilateral effusions worse on the right side, mild paraseptal emphysema, mediastinal adenopathy likely reactive or inflammatory, and enlargement of central pulmonary arteries in keeping with possible pulmonary hypertension.  Pulmonary consultation placed for additional evaluation management of complex relatively immunosuppressive patient with interstitial lung disease and systemic autoimmune disease.  Blood work does not show leukocytosis and stable anemia, he does share he has had loose stools over the last week, we did discuss possible infection and a stool PCR panel has been ordered.  Basic metabolic panel is essentially unremarkable with mild leukocytosis, normal renal function with GFR over 60.  Empirically patient is on vancomycin and cefepime, we discussed needing to possibly de-escalate as soon as infectious work-up comes back negative as previous.  02/08/22- patient is improved, reports less chest discomfort and dyspnea this am. He is weaned to 3L/min [place].  Procalcitonin is flat <0.10 renal function is normal and CBC with absence of leukocytosis.   PAST MEDICAL HISTORY   Past Medical History:  Diagnosis Date   H/O immunosuppressive therapy    Hyperpigmentation of skin    Hyperpigmentation rash/vesicles of the palm and face   Neutropenia (HCC)    RA (rheumatoid arthritis) ([place])      SURGICAL HISTORY   Past Surgical History:  Procedure Laterality Date   ESOPHAGOGASTRODUODENOSCOPY N/A 09/03/2021   Procedure: ESOPHAGOGASTRODUODENOSCOPY (EGD);  Surgeon: Lesly Rubenstein, MD;  Location: [facility] ENDOSCOPY;  Service: Endoscopy;  Laterality: N/A;  IR GASTROSTOMY TUBE MOD SED  09/11/2021     FAMILY HISTORY   Family History  Problem Relation Age of Onset   Hyperlipidemia Mother    Hypertension Father      SOCIAL HISTORY   Social History    Tobacco Use   Smoking status: Former    Types: Cigarettes   Smokeless tobacco: Never  Vaping Use   Vaping Use: Never used  Substance Use Topics   Alcohol use: Not Currently   Drug use: Not Currently    Types: Marijuana     MEDICATIONS    Home Medication:     Current Medication:  Current Facility-Administered Medications:    acetaminophen (TYLENOL) tablet 650 mg, 650 mg, Oral, Q6H PRN, 650 mg at 02/08/22 1149 **OR** acetaminophen (TYLENOL) suppository 650 mg, 650 mg, Rectal, Q6H PRN, Athena Masse, MD   albuterol (PROVENTIL) (2.5 MG/3ML) 0.083% nebulizer solution 3 mL, 3 mL, Inhalation, Q6H PRN, Athena Masse, MD   azithromycin (ZITHROMAX) 500 mg in sodium chloride 0.9 % 250 mL IVPB, 500 mg, Intravenous, Q24H, Judd Gaudier V, MD, Stopping previously hung infusion at 02/08/22 1146   ceFEPIme (MAXIPIME) 2 g in sodium chloride 0.9 % 100 mL IVPB, 2 g, Intravenous, Q8H, Judd Gaudier V, MD, Last Rate: 200 mL/hr at 02/08/22 0657, 2 g at 02/08/22 0657   enoxaparin (LOVENOX) injection 40 mg, 40 mg, Subcutaneous, Q24H, Damita Dunnings, Hazel V, MD   fluticasone furoate-vilanterol (BREO ELLIPTA) 100-25 MCG/ACT 1 puff, 1 puff, Inhalation, Daily, 1 puff at 02/08/22 0900 **AND** umeclidinium bromide (INCRUSE ELLIPTA) 62.5 MCG/ACT 1 puff, 1 puff, Inhalation, Daily, Wynelle Cleveland, RPH, 1 puff at 02/08/22 0901   guaiFENesin (MUCINEX) 12 hr tablet 600 mg, 600 mg, Oral, BID, Val Riles, MD, 600 mg at 02/08/22 0859   HYDROcodone bit-homatropine (HYCODAN) 5-1.5 MG/5ML syrup 5 mL, 5 mL, Oral, Q6H PRN, Val Riles, MD   hydroxychloroquine (PLAQUENIL) tablet 200 mg, 200 mg, Per Tube, BID, Judd Gaudier V, MD, 200 mg at 02/08/22 0859   lactated ringers infusion, , Intravenous, Continuous, Judd Gaudier V, MD, Last Rate: 100 mL/hr at 02/07/22 2312, New Bag at 02/07/22 2312   methylPREDNISolone sodium succinate (SOLU-MEDROL) 40 mg/mL injection 40 mg, 40 mg, Intravenous, Q24H, Galilea Quito, MD, 40  mg at 02/07/22 1824   ondansetron (ZOFRAN) tablet 4 mg, 4 mg, Oral, Q6H PRN **OR** ondansetron (ZOFRAN) injection 4 mg, 4 mg, Intravenous, Q6H PRN, Judd Gaudier V, MD, 4 mg at 02/07/22 0458   pantoprazole (PROTONIX) EC tablet 40 mg, 40 mg, Oral, Daily, Judd Gaudier V, MD, 40 mg at 02/08/22 0859   polyethylene glycol (MIRALAX / GLYCOLAX) packet 17 g, 17 g, Oral, Daily PRN, Athena Masse, MD    ALLERGIES   Patient has no known allergies.     REVIEW OF SYSTEMS    Review of Systems:  Gen:  Denies  fever, sweats, chills weigh loss  HEENT: Denies blurred vision, double vision, ear pain, eye pain, hearing loss, nose bleeds, sore throat Cardiac:  No dizziness, chest pain or heaviness, chest tightness,edema Resp:   reports dyspnea chronically  Gi: Denies swallowing difficulty, stomach pain, nausea or vomiting, diarrhea, constipation, bowel incontinence Gu:  Denies bladder incontinence, burning urine Ext:   Denies Joint pain, stiffness or swelling Skin: Denies  skin rash, easy bruising or bleeding or hives Endoc:  Denies polyuria, polydipsia , polyphagia or weight change Psych:   Denies depression, insomnia or hallucinations   Other:  All other systems negative  VS: BP 110/79 (BP Location: Left Arm)   Pulse 84   Temp 97.9 F (36.6 C) (Oral)   Resp 16   Ht _0  (1.956 m)   Wt 77.1 kg   SpO2 95%   BMI 20.16 kg/m      PHYSICAL EXAM    GENERAL:NAD, no fevers, chills, no weakness no fatigue HEAD: Normocephalic, atraumatic.  EYES: Pupils equal, round, reactive to light. Extraocular muscles intact. No scleral icterus.  MOUTH: Moist mucosal membrane. Dentition intact. No abscess noted.  EAR, NOSE, THROAT: Clear without exudates. No external lesions.  NECK: Supple. No thyromegaly. No nodules. No JVD.  PULMONARY: decreased breath sounds with mild rhonchi worse at bases bilaterally.  CARDIOVASCULAR: S1 and S2. Regular rate and rhythm. No murmurs, rubs, or gallops. No edema.  Pedal pulses 2+ bilaterally.  GASTROINTESTINAL: Soft, nontender, nondistended. No masses. Positive bowel sounds. No hepatosplenomegaly.  MUSCULOSKELETAL: No swelling, clubbing, or edema. Range of motion full in all extremities.  NEUROLOGIC: Cranial nerves II through XII are intact. No gross focal neurological deficits. Sensation intact. Reflexes intact.  SKIN: No ulceration, lesions, rashes, or cyanosis. Skin warm and dry. Turgor intact.  PSYCHIATRIC: Mood, affect within normal limits. The patient is awake, alert and oriented x 3. Insight, judgment intact.       IMAGING     Narrative & Impression  CLINICAL DATA:  Esophageal perforation, progressive hypoxia, cough, chest pain, dyspnea.   EXAM: CT CHEST WITH CONTRAST   TECHNIQUE: Multidetector CT imaging of the chest was performed during intravenous contrast administration.   RADIATION DOSE REDUCTION: This exam was performed according to the departmental dose-optimization program which includes automated exposure control, adjustment of the mA and/or kV according to patient size and/or use of iterative reconstruction technique.   CONTRAST:  94m OMNIPAQUE IOHEXOL 300 MG/ML  SOLN   COMPARISON:  01/29/2022, 09/01/2021   FINDINGS: Cardiovascular: No significant coronary artery calcification. Global cardiac size within normal limits. No pericardial effusion. Central pulmonary arteries are enlarged, similar to prior examination, in keeping with changes of pulmonary arterial hypertension. The thoracic aorta is unremarkable.   Mediastinum/Nodes: Pathologic thoracic adenopathy predominantly within the aorta pulmonary window appears stable since prior examination with the index lymph nodes measuring 12-15 mm in short axis diameter. Visualized thyroid is unremarkable. The esophagus is unremarkable. No pneumomediastinum.   Lungs/Pleura: Mild paraseptal emphysema. Bibasilar pulmonary consolidative infiltrates with associated volume  loss appears stable since prior examination. Numerous superimposed cysts are identified, similar to prior examination, as well as scattered inflammatory appearing centrilobular nodules and, together, the findings raise the question of an underlying interstitial lung disease such as lymphocytic interstitial pneumonia. Necrotizing infection, however, could appear similarly, as can be seen with recurrent aspiration. No pneumothorax. Small bilateral pleural effusions are again identified. No central obstructing lesion.   Upper Abdomen: No acute abnormality.   Musculoskeletal: No chest wall abnormality. No acute or significant osseous findings.   IMPRESSION: 1. Stable bibasilar pulmonary consolidative infiltrates with associated volume loss. Numerous superimposed cysts are similar to prior examination, as well as scattered inflammatory appearing centrilobular nodules and, together, the findings raise the question of an underlying interstitial lung disease such as lymphocytic interstitial pneumonia. Necrotizing infection, however, could appear similarly, as can be seen with recurrent aspiration. 2. Stable small bilateral pleural effusions. 3. Mild paraseptal emphysema. 4. Stable mediastinal adenopathy, possibly reactive or inflammatory. 5. Stable enlargement of the central pulmonary arteries in keeping with changes of pulmonary arterial hypertension.     ASSESSMENT/PLAN  Acute on hypoxemic respiratory failure - present on admission  - COVID19 negative  - supplemental O2 during my evaluation 4L/min>>>3L - will perform infectious workup for pneumonia -Respiratory viral panel- negative  -serum fungitell -legionella ab -strep pneumoniae ur AG -Histoplasma Ur Ag -sputum resp cultures- negative to date  -AFB sputum expectorated specimen -sputum cytology  -reviewed pertinent imaging with patient today - ESR/CRP/RF- elevated  -PT/OT for d/c planning  -please encourage patient to  use incentive spirometer few times each hour while hospitalized.       Esophageal perforation with PEG status -Patient is status post GI and ENT evaluation, states that he has been cleared to have p.o. fluids and ulcer is well-healing after most recent antimicrobials.  He stopped using his PEG tube and was eating normally p.o. this point.  His most recent swallow evaluation was abnormal he was recommended to have no oral nourishment.   Rheumatoid arthritis with acute exacerbation of interstitial lung disease -This appears to have occurred after cessation of Humira.  Patient has finally been able to wean down on prednisone therapy down to 10 mg for the first time in over a year however after he was recommended to DC Humira he reports progressive decline over period of 1 to 2 weeks with increased O2 requirement and resultant hospitalization.  I have restarted Solu-Medrol at this time.  Patient is able to speak in full sentences and is not having labored breathing and is not in distress during my evaluation. -Agree with continuing Plaquenil -Patient recently had ophthalmology evaluation and it was recommended for him to continue this medication. -Continue solumedrol 69m until 02/09/22 then may dc home with pred 541mto be tapered by 75m73mvery day.  Thank you for allowing me to participate in the care of this patient.   Patient/Family are satisfied with care plan and all questions have been answered.    Provider disclosure: Patient with at least one acute or chronic illness or injury that poses a threat to life or bodily function and is being managed actively during this encounter.  All of the below services have been performed independently by signing provider:  review of prior documentation from internal and or external health records.  Review of previous and current lab results.  Interview and comprehensive assessment during patient visit today. Review of current and previous chest radiographs/CT  scans. Discussion of management and test interpretation with health care team and patient/family.   This document was prepared using Dragon voice recognition software and may include unintentional dictation errors.     FuaOttie Glazier.D.  Division of Pulmonary & Critical Care Medicine

## 2022-02-08 NOTE — Progress Notes (Signed)
Speech Language Pathology Treatment: Dysphagia  Patient Details Name: Sean [name] MRN: [id number] DOB: 02/23/1976 Today's Date: 02/08/2022 Time: 6578-4696 SLP Time Calculation (min) (ACUTE ONLY): 20 min  Assessment / Plan / Recommendation Clinical Impression  Pt seen for follow up dysphagia intervention, targeting pt/family education and direct observation of PO. Pt remains on nasal canula, now on 3 liters (consistent with baseline per pt report). WBC remains low and pt is afebrile. No updated chest imaging.   Pt seen with trials of regular solids and thin liquids- no s/sx of aspiration across trials. Oral phase unremarkable. Education shared regarding identified deficits on previous MBSS on 2/95 and implications for swallow safety and need for aspiration precautions. Pt/significant other report that pt consistently eats slow and utilizes liquid and multiple swallows to facilitate clearance. Education shared regarding selection of soft solids, monitoring posture for intake, and indication of infection. Pt/significant other reported understanding across education.   Recommend continued regular solids and thin liquids with general aspiration precautions (slow rate, small bites, elevated HOB, and alert for PO intake). Intermittent supervision for compliance with precautions. Of note, pt reporting continued [place] of PEG for nutrition when too fatigued to eat, SLP advised pt to speak with MD regarding concern for plan to remove PEG tube. SLP will continue to follow during acute hospital course as indicated. Recommend OP follow up (as indicated by MBSS completed at [facility]).     HPI HPI: Per H&P: Sean [name] is a 46 y.o. male with medical history significant for connective tissue disease complicated by esophageal perforation requiring PEG tube and ILD on 3L [employer]  on long-term immunosuppressants ,recently hospitalized from 10/20 to 10/22 for with pneumonia treated with ceftriaxone and azithromycin and  discharged on Amoxil,Who had been doing well since discharge but now presents with a 24-hour history of increased cough and shortness of breath with increased O2 requirement.  O2 sats at home was as low as 74% on room air.  Patient wears his oxygen only as needed but now has to wear it continuously.  He denies fever or chills and has an occasional cough productive of white phlegm.  Patient who has PEG tube has been cleared for swallowing and is able to eat and denies any recent choking episodes.  Patient was undergoing extensive diagnostic evaluation for connective tissue disease at [facility] where he established care a few days ago on 10/26.  Pt is currently on 4L nasal canula (baseline 3L at home per pt) and on a regular solids and thin liquids diet. Father and sister present for assessment. Chest CT 02/06/22: 1. Stable bibasilar pulmonary consolidative infiltrates with associated volume loss. Numerous superimposed cysts are similar to prior examination, as well as scattered inflammatory appearing centrilobular nodules and, together, the findings raise the question of an underlying interstitial lung disease such as lymphocytic interstitial pneumonia. Necrotizing infection, however, could appear similarly, as can be seen with recurrent aspiration. Stable small bilateral pleural effusions. Mild paraseptal emphysema. Stable mediastinal adenopathy, possibly reactive or inflammatory. Stable enlargement of the central pulmonary arteries in keeping with changes of pulmonary arterial hypertension.      SLP Plan  Continue with current plan of care      Recommendations for follow up therapy are one component of a multi-disciplinary discharge planning process, led by the attending physician.  Recommendations may be updated based on patient status, additional functional criteria and insurance authorization.    Recommendations  Diet recommendations: Regular;Thin liquid Liquids provided via: Straw;Cup Medication  Administration: Whole  meds with liquid Supervision: Patient able to self feed;Intermittent supervision to cue for compensatory strategies Compensations: Minimize environmental distractions;Slow rate;Small sips/bites;Follow solids with liquid Postural Changes and/or Swallow Maneuvers: Seated upright 90 degrees;Upright 30-60 min after meal               Oral Care Recommendations: Oral care BID Follow Up Recommendations: Outpatient SLP Assistance recommended at discharge: Intermittent Supervision/Assistance SLP Visit Diagnosis: Dysphagia, pharyngoesophageal phase (R13.14) Plan: Continue with current plan of care           [place] J Clapp  02/08/2022, 1:29 PM

## 2022-02-08 NOTE — Progress Notes (Signed)
Patient received from 2A via wheelchair.  Patient is alert and oriented x 4, girlfriend/significant other at bedside.  Patient able to get up and transfer to bed independently.  Oriented to room and unit routine, call bell within reach.  Needs addressed.

## 2022-02-08 NOTE — Hospital Course (Signed)
46 y.o. 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  Readmitted for acute hypoxic respiratory failure likely due to pneumonia  10/29: Pulmonary consult, currently on 4 L oxygen 10/30: Weaned off to room air 10/31: Oxygen requirement increased to 4.5 L as he was hypoxic in mid 80s on 3 L earlier.  Started Lasix

## 2022-02-08 NOTE — Assessment & Plan Note (Signed)
Follows at [name] with rheumatology 

## 2022-02-08 NOTE — Assessment & Plan Note (Signed)
Initially required 4 L oxygen.  Now weaned off to room air

## 2022-02-09 ENCOUNTER — Inpatient Hospital Stay: Payer: 59

## 2022-02-09 DIAGNOSIS — J189 Pneumonia, unspecified organism: Secondary | ICD-10-CM | POA: Diagnosis not present

## 2022-02-09 DIAGNOSIS — J9621 Acute and chronic respiratory failure with hypoxia: Secondary | ICD-10-CM | POA: Diagnosis not present

## 2022-02-09 DIAGNOSIS — M359 Systemic involvement of connective tissue, unspecified: Secondary | ICD-10-CM | POA: Diagnosis not present

## 2022-02-09 DIAGNOSIS — M0579 Rheumatoid arthritis with rheumatoid factor of multiple sites without organ or systems involvement: Secondary | ICD-10-CM | POA: Diagnosis not present

## 2022-02-09 LAB — CBC
HCT: 40 % (ref 39.0–52.0)
Hemoglobin: 13.4 g/dL (ref 13.0–17.0)
MCH: 30.9 pg (ref 26.0–34.0)
MCHC: 33.5 g/dL (ref 30.0–36.0)
MCV: 92.2 fL (ref 80.0–100.0)
Platelets: 246 10*3/uL (ref 150–400)
RBC: 4.34 MIL/uL (ref 4.22–5.81)
RDW: 12.6 % (ref 11.5–15.5)
WBC: 5.7 10*3/uL (ref 4.0–10.5)
nRBC: 0 % (ref 0.0–0.2)

## 2022-02-09 LAB — BASIC METABOLIC PANEL
Anion gap: 7 (ref 5–15)
BUN: 11 mg/dL (ref 6–20)
CO2: 21 mmol/L — ABNORMAL LOW (ref 22–32)
Calcium: 8.8 mg/dL — ABNORMAL LOW (ref 8.9–10.3)
Chloride: 111 mmol/L (ref 98–111)
Creatinine, Ser: 0.81 mg/dL (ref 0.61–1.24)
GFR, Estimated: >60 mL/min (ref >60)
Glucose, Bld: 75 mg/dL (ref 70–99)
Potassium: 3.8 mmol/L (ref 3.5–5.1)
Sodium: 139 mmol/L (ref 135–145)

## 2022-02-09 LAB — PROCALCITONIN: Procalcitonin: <0.1 ng/mL

## 2022-02-09 LAB — SCLERODERMA DIAGNOSTIC PROFILE
Anti Nuclear Antibody (ANA): NEGATIVE
Scleroderma (Scl-70) (ENA) Antibody, IgG: <0.2 AI (ref 0.0–0.9)

## 2022-02-09 MED ORDER — PREDNISONE 20 MG PO TABS
50.0000 mg | ORAL_TABLET | Freq: Every day | ORAL | Status: DC
  Administered 2022-02-10: 50 mg via ORAL
  Filled 2022-02-09: qty 1

## 2022-02-09 MED ORDER — FUROSEMIDE 10 MG/ML IJ SOLN
40.0000 mg | Freq: Every day | INTRAMUSCULAR | Status: DC
  Administered 2022-02-09 – 2022-02-12 (×4): 40 mg via INTRAVENOUS
  Filled 2022-02-09 (×4): qty 4

## 2022-02-09 NOTE — Assessment & Plan Note (Signed)
Patient was recently treated for CAP from 10/20 to 10/27 WBC procalcitonin and lactic acid normal but could be related to recent antibiotics as well as immunosuppressants Switched him to oral Augmentin DuoNebs as needed Appreciate pulmonary input

## 2022-02-09 NOTE — TOC Progression Note (Signed)
Transition of Care [facility]) - Progression Note    Patient Details  Name: Sean [name] MRN: [id number] Date of Birth: [birth year]  [facility] CM/SW Contact  Beverly Sessions, RN Phone Number: 02/09/2022, 4:00 PM  Clinical Narrative:     TOC confirmed with [employer] that patient has orders for continuous O2 and a home fill unit at home with portable tanks        Expected Discharge Plan and Services                                                 Social Determinants of Health (SDOH) Interventions    Readmission Risk Interventions    09/14/2021   11:11 AM  Readmission Risk Prevention Plan  Transportation Screening Complete  PCP or Specialist Appt within 5-7 Days Complete  Home Care Screening Complete  Medication Review (RN CM) Complete

## 2022-02-09 NOTE — Assessment & Plan Note (Signed)
Patient was seen by [facility] rheumatology on 10/26 and underwent extensive evaluation to determine nature of his connective tissue disease Until recently being treated as RA with pulmonary involvement (note reviewed) Patient complains of pain and has ongoing myalgia and arthralgia Currently on mycophenolate, hydroxychloroquine and prednisone taper Patient stated Humira was discontinued by [name] at that visit on 10/26 Holding immunosuppressants for now given suspicion of pneumonia

## 2022-02-09 NOTE — Assessment & Plan Note (Signed)
Follows at [facility] with rheumatology

## 2022-02-09 NOTE — Assessment & Plan Note (Signed)
Initially required 4 L oxygen.  Was weaned off to room air yesterday and thus transferred to [place] from progressive care but again this morning his oxygen requirement started going up and now he is on 4.5 L oxygen.  Discussed with pulmonary and started him on Lasix 40 mg daily

## 2022-02-09 NOTE — Plan of Care (Signed)
Pt alert and oriented x 4. Up adlib in rm. Oxygen 3L/[place] uses at home. Vitals stable. Pt received 2 doses of tramadol this shift for chronic RA pain.  Problem: Activity: Goal: Ability to tolerate increased activity will improve Outcome: Progressing   Problem: Clinical Measurements: Goal: Ability to maintain a body temperature in the normal range will improve Outcome: Progressing   Problem: Respiratory: Goal: Ability to maintain adequate ventilation will improve Outcome: Progressing Goal: Ability to maintain a clear airway will improve Outcome: Progressing   Problem: Education: Goal: Knowledge of General Education information will improve Description: Including pain rating scale, medication(s)/side effects and non-pharmacologic comfort measures Outcome: Progressing   Problem: Health Behavior/Discharge Planning: Goal: Ability to manage health-related needs will improve Outcome: Progressing   Problem: Clinical Measurements: Goal: Ability to maintain clinical measurements within normal limits will improve Outcome: Progressing Goal: Will remain free from infection Outcome: Progressing Goal: Diagnostic test results will improve Outcome: Progressing Goal: Respiratory complications will improve Outcome: Progressing Goal: Cardiovascular complication will be avoided Outcome: Progressing   Problem: Activity: Goal: Risk for activity intolerance will decrease Outcome: Progressing   Problem: Nutrition: Goal: Adequate nutrition will be maintained Outcome: Progressing   Problem: Coping: Goal: Level of anxiety will decrease Outcome: Progressing   Problem: Elimination: Goal: Will not experience complications related to bowel motility Outcome: Progressing Goal: Will not experience complications related to urinary retention Outcome: Progressing   Problem: Pain Managment: Goal: General experience of comfort will improve Outcome: Progressing   Problem: Safety: Goal: Ability to  remain free from injury will improve Outcome: Progressing   Problem: Skin Integrity: Goal: Risk for impaired skin integrity will decrease Outcome: Progressing

## 2022-02-09 NOTE — Assessment & Plan Note (Signed)
Flare could be due to cessation of Humira.  Continue Plaquenil.  Responded well to steroid

## 2022-02-09 NOTE — Assessment & Plan Note (Signed)
Eating orally.  Patient continues to ignore ST recommendation.  Per speech therapy  Pt has a complicated history and has been recommended to be NPO per the last 2 MBSSs AND a recent visit to [place] (w/ MBSS too per pt) rec'd continued use of PEG d/t the perforation NOT healing at this time -- "communication between theproximalpharyngo-Esophageal wall(posterior wall) resulting in contrast moving into the retropharyngeal space.", per ENT/MDs/chart notes.  Per chart note last admit on 01/29/22: "Last saw [facility] on October 13, at that time imaging was reviewed that continued to show an esophageal perforation, however surgery was not felt to be inherently helpful, and after review of all his options decision made to think about it and reassess, do not want to remove his PEG at this time".  CT of Chest 10/20: showed progression of bilateral lower lung disease concerning for worsening superimposed infection versus worsening chronic interstitial lung disease.  Consult Palliative Care to discuss his GOC overall as he continues to choose to eat/drink by mouth more so than use the PEG tube. Ongoing concern for "fistula" issues re: the Esophagus per chart notes.   I will order incentive spirometry and place maintain aspiration precautions

## 2022-02-09 NOTE — Progress Notes (Signed)
Progress Note   Patient: Sean [name] WLN:[id number] DOB: 1975-08-08 DOA: 02/06/2022     2 DOS: the patient was seen and examined on 02/09/2022   Brief hospital course: 46 y.o. 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  Readmitted for acute hypoxic respiratory failure likely due to pneumonia  10/29: Pulmonary consult, currently on 4 L oxygen 10/30: Weaned off to room air 10/31: Oxygen requirement increased to 4.5 L as he was hypoxic in mid 80s on 3 L earlier.  Started Lasix     Assessment and Plan: * Multifocal pneumonia Patient was recently treated for CAP from 10/20 to 10/27 WBC procalcitonin and lactic acid normal but could be related to recent antibiotics as well as immunosuppressants Switched him to oral Augmentin DuoNebs as needed Appreciate pulmonary input  Acute on chronic respiratory failure with hypoxia ([place]) Initially required 4 L oxygen.  Was weaned off to room air yesterday and thus transferred to [place] from progressive care but again this morning his oxygen requirement started going up and now he is on 4.5 L oxygen.  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  Continue Plaquenil.  Responded well to steroid  History of esophageal perforation Eating orally.  Patient continues to ignore ST recommendation.  Per speech therapy  Pt has a complicated history and has been recommended to be NPO per the last 2 MBSSs AND a recent visit to [place] (w/ MBSS too per pt) rec'd continued use of PEG d/t the perforation NOT healing at this time -- "communication between theproximalpharyngo-Esophageal wall(posterior wall) resulting in contrast moving into the retropharyngeal space.", per ENT/MDs/chart notes.  Per chart note last admit on 01/29/22: "Last saw [facility] on October 13, at that time imaging was reviewed that continued to show an esophageal perforation, however surgery was not felt to be inherently helpful, and after review of all his options decision made to think about it and reassess, do not want to remove his PEG at this time".  CT of Chest 10/20: showed progression of bilateral lower lung disease concerning for worsening superimposed infection versus worsening chronic interstitial lung disease.  Consult Palliative Care to discuss his GOC overall as he continues to choose to eat/drink by mouth more so than use the PEG tube. Ongoing concern for "fistula" issues re: the Esophagus per chart notes.   I will order incentive spirometry and place maintain aspiration precautions        Subjective: Feels short of breath  Physical Exam: Vitals:   02/09/22 0845 02/09/22 7408  02/09/22 0850 02/09/22 1500  BP:  107/67  106/73  Pulse:  (!) 108  (!) 102  Resp:  20  18  Temp:  98.9 F (37.2 C)  98.6 F (37 C)  TempSrc:  Oral  Oral   SpO2: (!) 89% 90% 91% 93%  Weight:      Height:       46 year old male lying in the bed comfortably without any acute distress Lungs decreased breath sounds at the bases bilaterally Heart regular rate and rhythm Abdomen soft, benign Neuro alert and awake, nonfocal Skin no rash or lesion Data Reviewed:  Chest x-ray shows bilateral effusion  Family Communication: None  Disposition: Status is: Inpatient Remains inpatient appropriate because: Management of respiratory failure   Planned Discharge Destination: Home    DVT prophylaxis-Lovenox Time spent: 35 minutes  Author: Max Sane, MD 02/09/2022 5:12 PM  For on call review [email or web address].

## 2022-02-09 NOTE — Progress Notes (Signed)
End of shift note:  Pt was started on IV lasix for pleura effusion. Repeat chest xray was done today. Pt had an increased of O2 requirement. He was on 3L [place] during the night but his SpO2 was 88% so we increased O2 to 4.5 L [place].

## 2022-02-09 NOTE — Progress Notes (Signed)
PULMONOLOGY         Date: 02/09/2022,   MRN# [id number] Sean [name] 25-Feb-1976     AdmissionWeight: 77.1 kg                 CurrentWeight: 77.1 kg  Referring provider: Dr Dwyane Dee   CHIEF COMPLAINT:   Acute on chronic chronic hypoxemic respiratory failure.   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  Does have history of smoking with paraseptal emphysema with concomitant areas of likely interstitial lung disease and fibrotic changes worse at the bases bilaterally.  Additionally patient has been on multiple immunosuppressive agents including Humira, Plaquenil, steroids chronically, CellCept which do predispose him for infection.  He has been very difficult to wean from steroids and generally starts to flare underlying interstitial lung disease with worsening hypoxemia when steroids are tapered to less than 40 mg of prednisone.  He does respond fairly quickly to steroids and generally has improvement in oxygenation post glucocorticoid therapy.  Is also had recurrent effusions which have been drained via thoracentesis and have not been shown to be infected or parapneumonic/empyema.  Patient reports over past week he slowly but steadily continued to get weak and more dyspneic.  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 He has been eating orally and has not been eating via PEG tube. He denies feeling flu like illness. He has been urinating well denies dysuria or dark urine.  Having normal well formed with intermittent loose stools.   Patient was asked to stop his Humira after most recent rheumatology evaluation with [name] and reports clinical decline after skipping his last injection.  On  admission in the ED he did not have additional CT chest with findings of consolidative infiltrates with associated volume loss as well as bilateral effusions worse on the right side, mild paraseptal emphysema, mediastinal adenopathy likely reactive or inflammatory, and enlargement of central pulmonary arteries in keeping with possible pulmonary hypertension.  Pulmonary consultation placed for additional evaluation management of complex relatively immunosuppressive patient with interstitial lung disease and systemic autoimmune disease.  Blood work does not show leukocytosis and stable anemia, he does share he has had loose stools over the last week, we did discuss possible infection and a stool PCR panel has been ordered.  Basic metabolic panel is essentially unremarkable with mild leukocytosis, normal renal function with GFR over 60.  Empirically patient is on vancomycin and cefepime, we discussed needing to possibly de-escalate as soon as infectious work-up comes back negative as previous.  02/08/22- patient is improved, reports less chest discomfort and dyspnea this am. He is weaned to 3L/min .  Procalcitonin is flat <0.10 renal function is normal and CBC with absence of leukocytosis.   02/09/22- patient had mild worsening with hypoxemia and increased O2 req.  He is eating PO as well and may have overlying aspiration. He has been diuresed and chest x ray shows a moderate effusion.   PAST MEDICAL HISTORY   Past Medical History:  Diagnosis Date   H/O immunosuppressive therapy    Hyperpigmentation of skin    Hyperpigmentation rash/vesicles of the palm and face   Neutropenia (HCC)    RA (rheumatoid arthritis) ([place])      SURGICAL HISTORY  Past Surgical History:  Procedure Laterality Date   ESOPHAGOGASTRODUODENOSCOPY N/A 09/03/2021   Procedure: ESOPHAGOGASTRODUODENOSCOPY (EGD);  Surgeon: Lesly Rubenstein, MD;  Location: [facility] ENDOSCOPY;  Service: Endoscopy;  Laterality: N/A;   IR  GASTROSTOMY TUBE MOD SED  09/11/2021     FAMILY HISTORY   Family History  Problem Relation Age of Onset   Hyperlipidemia Mother    Hypertension Father      SOCIAL HISTORY   Social History   Tobacco Use   Smoking status: Former    Types: Cigarettes   Smokeless tobacco: Never  Vaping Use   Vaping Use: Never used  Substance Use Topics   Alcohol use: Not Currently   Drug use: Not Currently    Types: Marijuana     MEDICATIONS    Home Medication:     Current Medication:  Current Facility-Administered Medications:    acetaminophen (TYLENOL) tablet 650 mg, 650 mg, Oral, Q6H PRN, 650 mg at 02/08/22 1149 **OR** acetaminophen (TYLENOL) suppository 650 mg, 650 mg, Rectal, Q6H PRN, Athena Masse, MD   albuterol (PROVENTIL) (2.5 MG/3ML) 0.083% nebulizer solution 3 mL, 3 mL, Inhalation, Q6H PRN, Athena Masse, MD   amoxicillin-clavulanate (AUGMENTIN) 875-125 MG per tablet 1 tablet, 1 tablet, Oral, Q12H, Ottie Glazier, MD, 1 tablet at 02/09/22 0845   enoxaparin (LOVENOX) injection 40 mg, 40 mg, Subcutaneous, Q24H, Damita Dunnings, Hazel V, MD   fluticasone furoate-vilanterol (BREO ELLIPTA) 100-25 MCG/ACT 1 puff, 1 puff, Inhalation, Daily, 1 puff at 02/08/22 0900 **AND** umeclidinium bromide (INCRUSE ELLIPTA) 62.5 MCG/ACT 1 puff, 1 puff, Inhalation, Daily, Wynelle Cleveland, RPH, 1 puff at 02/08/22 0901   furosemide (LASIX) injection 40 mg, 40 mg, Intravenous, Daily, Lanney Gins, Nathanel Tallman, MD, 40 mg at 02/09/22 1155   guaiFENesin (MUCINEX) 12 hr tablet 600 mg, 600 mg, Oral, BID, Val Riles, MD, 600 mg at 02/09/22 0845   HYDROcodone bit-homatropine (HYCODAN) 5-1.5 MG/5ML syrup 5 mL, 5 mL, Oral, Q6H PRN, Val Riles, MD   hydroxychloroquine (PLAQUENIL) tablet 200 mg, 200 mg, Per Tube, BID, Judd Gaudier V, MD, 200 mg at 02/09/22 0846   ondansetron (ZOFRAN) tablet 4 mg, 4 mg, Oral, Q6H PRN **OR** ondansetron (ZOFRAN) injection 4 mg, 4 mg, Intravenous, Q6H PRN, Judd Gaudier V, MD, 4 mg at  02/07/22 0458   pantoprazole (PROTONIX) EC tablet 40 mg, 40 mg, Oral, Daily, Judd Gaudier V, MD, 40 mg at 02/09/22 0845   polyethylene glycol (MIRALAX / GLYCOLAX) packet 17 g, 17 g, Oral, Daily PRN, Athena Masse, MD   [START ON 02/10/2022] predniSONE (DELTASONE) tablet 50 mg, 50 mg, Oral, Q breakfast, Izzie Geers, MD   traMADol (ULTRAM) tablet 50 mg, 50 mg, Oral, Q6H PRN, Max Sane, MD, 50 mg at 02/09/22 0510    ALLERGIES   Patient has no known allergies.     REVIEW OF SYSTEMS    Review of Systems:  Gen:  Denies  fever, sweats, chills weigh loss  HEENT: Denies blurred vision, double vision, ear pain, eye pain, hearing loss, nose bleeds, sore throat Cardiac:  No dizziness, chest pain or heaviness, chest tightness,edema Resp:   reports dyspnea chronically  Gi: Denies swallowing difficulty, stomach pain, nausea or vomiting, diarrhea, constipation, bowel incontinence Gu:  Denies bladder incontinence, burning urine Ext:   Denies Joint pain, stiffness or swelling Skin: Denies  skin rash, easy bruising or bleeding or hives Endoc:  Denies polyuria, polydipsia , polyphagia or weight change Psych:   Denies depression, insomnia or hallucinations   Other:  All  other systems negative   VS: BP 106/73 (BP Location: Left Arm)   Pulse (!) 102   Temp 98.6 F (37 C) (Oral)   Resp 18   Ht _0  (1.956 m)   Wt 77.1 kg   SpO2 93%   BMI 20.16 kg/m      PHYSICAL EXAM    GENERAL:NAD, no fevers, chills, no weakness no fatigue HEAD: Normocephalic, atraumatic.  EYES: Pupils equal, round, reactive to light. Extraocular muscles intact. No scleral icterus.  MOUTH: Moist mucosal membrane. Dentition intact. No abscess noted.  EAR, NOSE, THROAT: Clear without exudates. No external lesions.  NECK: Supple. No thyromegaly. No nodules. No JVD.  PULMONARY: decreased breath sounds with mild rhonchi worse at bases bilaterally.  CARDIOVASCULAR: S1 and S2. Regular rate and rhythm. No murmurs,  rubs, or gallops. No edema. Pedal pulses 2+ bilaterally.  GASTROINTESTINAL: Soft, nontender, nondistended. No masses. Positive bowel sounds. No hepatosplenomegaly.  MUSCULOSKELETAL: No swelling, clubbing, or edema. Range of motion full in all extremities.  NEUROLOGIC: Cranial nerves II through XII are intact. No gross focal neurological deficits. Sensation intact. Reflexes intact.  SKIN: No ulceration, lesions, rashes, or cyanosis. Skin warm and dry. Turgor intact.  PSYCHIATRIC: Mood, affect within normal limits. The patient is awake, alert and oriented x 3. Insight, judgment intact.       IMAGING         Narrative & Impression  CLINICAL DATA:  Esophageal perforation, progressive hypoxia, cough, chest pain, dyspnea.   EXAM: CT CHEST WITH CONTRAST   TECHNIQUE: Multidetector CT imaging of the chest was performed during intravenous contrast administration.   RADIATION DOSE REDUCTION: This exam was performed according to the departmental dose-optimization program which includes automated exposure control, adjustment of the mA and/or kV according to patient size and/or use of iterative reconstruction technique.   CONTRAST:  22m OMNIPAQUE IOHEXOL 300 MG/ML  SOLN   COMPARISON:  01/29/2022, 09/01/2021   FINDINGS: Cardiovascular: No significant coronary artery calcification. Global cardiac size within normal limits. No pericardial effusion. Central pulmonary arteries are enlarged, similar to prior examination, in keeping with changes of pulmonary arterial hypertension. The thoracic aorta is unremarkable.   Mediastinum/Nodes: Pathologic thoracic adenopathy predominantly within the aorta pulmonary window appears stable since prior examination with the index lymph nodes measuring 12-15 mm in short axis diameter. Visualized thyroid is unremarkable. The esophagus is unremarkable. No pneumomediastinum.   Lungs/Pleura: Mild paraseptal emphysema. Bibasilar pulmonary consolidative  infiltrates with associated volume loss appears stable since prior examination. Numerous superimposed cysts are identified, similar to prior examination, as well as scattered inflammatory appearing centrilobular nodules and, together, the findings raise the question of an underlying interstitial lung disease such as lymphocytic interstitial pneumonia. Necrotizing infection, however, could appear similarly, as can be seen with recurrent aspiration. No pneumothorax. Small bilateral pleural effusions are again identified. No central obstructing lesion.   Upper Abdomen: No acute abnormality.   Musculoskeletal: No chest wall abnormality. No acute or significant osseous findings.   IMPRESSION: 1. Stable bibasilar pulmonary consolidative infiltrates with associated volume loss. Numerous superimposed cysts are similar to prior examination, as well as scattered inflammatory appearing centrilobular nodules and, together, the findings raise the question of an underlying interstitial lung disease such as lymphocytic interstitial pneumonia. Necrotizing infection, however, could appear similarly, as can be seen with recurrent aspiration. 2. Stable small bilateral pleural effusions. 3. Mild paraseptal emphysema. 4. Stable mediastinal adenopathy, possibly reactive or inflammatory. 5. Stable enlargement of the central pulmonary arteries in keeping with changes of  pulmonary arterial hypertension.     ASSESSMENT/PLAN   Acute on hypoxemic respiratory failure - present on admission  - COVID19 negative  - supplemental O2 during my evaluation 4L/min>>>3L - will perform infectious workup for pneumonia -Respiratory viral panel- negative  -serum fungitell -legionella ab -strep pneumoniae ur AG -Histoplasma Ur Ag -sputum resp cultures- negative to date  -AFB sputum expectorated specimen -sputum cytology  -reviewed pertinent imaging with patient today - ESR/CRP/RF- elevated  -PT/OT for d/c  planning  -please encourage patient to use incentive spirometer few times each hour while hospitalized.       Esophageal perforation with PEG status -Patient is status post GI and ENT evaluation, states that he has been cleared to have p.o. fluids and ulcer is well-healing after most recent antimicrobials.  He stopped using his PEG tube and was eating normally p.o. this point.  His most recent swallow evaluation was abnormal he was recommended to have no oral nourishment.   Rheumatoid arthritis with acute exacerbation of interstitial lung disease -This appears to have occurred after cessation of Humira.  Patient has finally been able to wean down on prednisone therapy down to 10 mg for the first time in over a year however after he was recommended to DC Humira he reports progressive decline over period of 1 to 2 weeks with increased O2 requirement and resultant hospitalization.  I have restarted Solu-Medrol at this time.  Patient is able to speak in full sentences and is not having labored breathing and is not in distress during my evaluation. -Agree with continuing Plaquenil -Patient recently had ophthalmology evaluation and it was recommended for him to continue this medication. -Continue solumedrol 47m until 02/09/22 then may dc home with pred 543mto be tapered by 18m71mvery day.  Thank you for allowing me to participate in the care of this patient.   Patient/Family are satisfied with care plan and all questions have been answered.    Provider disclosure: Patient with at least one acute or chronic illness or injury that poses a threat to life or bodily function and is being managed actively during this encounter.  All of the below services have been performed independently by signing provider:  review of prior documentation from internal and or external health records.  Review of previous and current lab results.  Interview and comprehensive assessment during patient visit today. Review of  current and previous chest radiographs/CT scans. Discussion of management and test interpretation with health care team and patient/family.   This document was prepared using Dragon voice recognition software and may include unintentional dictation errors.     FuaOttie Glazier.D.  Division of Pulmonary & Critical Care Medicine

## 2022-02-09 NOTE — Progress Notes (Addendum)
SLP F/U Note  Patient Details Name: Sean [name] MRN: [id number] DOB: October 24, 1975   F/U Note:       Reason Eval/Treat Not Completed:  (chart reviewed; consulted NSG then met w/ pt in room.) Pt has a complicated history and has been recommended to be NPO per the last 2 MBSSs AND a recent visit to [place] (w/ MBSS too per pt) rec'd continued use of PEG d/t the perforation NOT healing at this time -- "communication between the proximal pharyngo-Esophageal wall (posterior wall) resulting in contrast moving into the retropharyngeal space.", per ENT/MDs/chart notes.  On MBSS on 09/09/21: he had "No aspiration during po trials of thin and Nectar liquids, puree, minced/moistened solids. Airway protection was adequate.". The oropharyngeal phase swallow appears adequate, but the Esophagus is not it seems.   Per chart note last admit on 01/29/22: "Last saw [facility] on October 13, at that time imaging was reviewed that continued to show an esophageal perforation, however surgery was not felt to be inherently helpful, and after review of all his options decision made to think about it and reassess, do not want to remove his PEG at this time".  CT of Chest 10/20: showed progression of bilateral lower lung disease concerning for worsening superimposed infection versus worsening chronic interstitial lung disease.  Recommend f/u w/ MDs and a Palliative Care consult in order to discuss his [place] overall as he continues to choose to eat/drink by mouth moreso than use the PEG tube. PEG use could help support nutrition needs possibly. He currently denies any oropharyngeal phase difficulty swallowing; NSG agreed. Ongoing concern for "fistula" issues re: the Esophagus resulting in Pulmonary impact possibly (see chart notes). Recommended/gave an Chiropodist for breathing exs.; recommend general aspiration precautions as discussed w/ him at [facility]. No further skilled ST services indicated at this time as the oropharyngeal  phase swallowing appears functional/adequate. Pt agreed. MD/NSG updated.       Orinda Kenner, [place], CCC-SLP Speech Language Pathologist Rehab Services; [place] [phone] (ascom) Kashana Breach 02/09/2022, 9:28 AM

## 2022-02-10 DIAGNOSIS — J189 Pneumonia, unspecified organism: Secondary | ICD-10-CM | POA: Diagnosis not present

## 2022-02-10 LAB — BASIC METABOLIC PANEL
Anion gap: 5 (ref 5–15)
BUN: 14 mg/dL (ref 6–20)
CO2: 25 mmol/L (ref 22–32)
Calcium: 9 mg/dL (ref 8.9–10.3)
Chloride: 105 mmol/L (ref 98–111)
Creatinine, Ser: 0.92 mg/dL (ref 0.61–1.24)
GFR, Estimated: >60 mL/min (ref >60)
Glucose, Bld: 76 mg/dL (ref 70–99)
Potassium: 4.1 mmol/L (ref 3.5–5.1)
Sodium: 135 mmol/L (ref 135–145)

## 2022-02-10 LAB — CBC
HCT: 42.1 % (ref 39.0–52.0)
Hemoglobin: 13.9 g/dL (ref 13.0–17.0)
MCH: 30.8 pg (ref 26.0–34.0)
MCHC: 33 g/dL (ref 30.0–36.0)
MCV: 93.1 fL (ref 80.0–100.0)
Platelets: 243 10*3/uL (ref 150–400)
RBC: 4.52 MIL/uL (ref 4.22–5.81)
RDW: 13.1 % (ref 11.5–15.5)
WBC: 4.5 10*3/uL (ref 4.0–10.5)
nRBC: 0 % (ref 0.0–0.2)

## 2022-02-10 LAB — RHEUMATOID FACTOR: Rheumatoid fact SerPl-aCnc: 240.3 IU/mL — ABNORMAL HIGH (ref <14.0)

## 2022-02-10 MED ORDER — MYCOPHENOLATE MOFETIL 250 MG PO CAPS
1000.0000 mg | ORAL_CAPSULE | Freq: Two times a day (BID) | ORAL | Status: DC
  Administered 2022-02-10 – 2022-02-11 (×3): 1000 mg via ORAL
  Filled 2022-02-10 (×3): qty 4

## 2022-02-10 MED ORDER — PREDNISONE 20 MG PO TABS
40.0000 mg | ORAL_TABLET | Freq: Every day | ORAL | Status: DC
  Administered 2022-02-11: 40 mg via ORAL
  Filled 2022-02-10: qty 2

## 2022-02-10 MED ORDER — ALBUTEROL SULFATE (2.5 MG/3ML) 0.083% IN NEBU
2.5000 mg | INHALATION_SOLUTION | Freq: Two times a day (BID) | RESPIRATORY_TRACT | Status: DC
  Administered 2022-02-10 – 2022-02-11 (×2): 2.5 mg via RESPIRATORY_TRACT
  Filled 2022-02-10 (×2): qty 3

## 2022-02-10 MED ORDER — SULFAMETHOXAZOLE-TRIMETHOPRIM 800-160 MG PO TABS
1.0000 | ORAL_TABLET | ORAL | Status: DC
  Administered 2022-02-10 – 2022-02-12 (×2): 1 via ORAL
  Filled 2022-02-10 (×2): qty 1

## 2022-02-10 NOTE — Plan of Care (Signed)

## 2022-02-10 NOTE — Progress Notes (Signed)
PROGRESS NOTE    Sean [name]  WLN:[id number] DOB: 04-09-1976 DOA: 44/28/2023 PCP: [name], MD    Brief Narrative:  46 y.o. 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  Readmitted for acute hypoxic respiratory failure likely due to pneumonia   10/29: Pulmonary consult, currently on 4 L oxygen 10/30: Weaned off to room air 10/31: Oxygen requirement increased to 4.5 L as he was hypoxic in mid 80s on 3 L earlier.  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  Was weaned off to room air however subsequently became hypoxic again.  As of 11/1 he is on 4 L.  Respiratory status appears to symptomatically improved.  Stress incentive spirometry use.  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  Plaquenil and prednisone on active MAR     ILD (interstitial lung disease) (HCC) Flare could be due to cessation of Humira.  Continue Plaquenil.  Responded well to steroid.  Continue prednisone with daily taper   History of esophageal perforation Eating orally.  Patient continues to ignore ST recommendation.  Per speech therapy   Pt has a complicated history and has been recommended to be NPO per the last 2 MBSSs AND a recent visit to [name] (w/ MBSS too per pt) rec'd continued use of PEG d/t the perforation NOT healing at this time -- "communication between the proximal pharyngo-Esophageal wall (posterior wall) resulting in contrast moving into the retropharyngeal space.", per ENT/MDs/chart notes.   Per chart note last admit on 01/29/22: "Last saw [name]NT on October 13, at that time imaging was reviewed that continued to show an esophageal perforation, however surgery was not felt to be inherently helpful, and after review of all his options decision made to think about it and reassess, do not want to remove his PEG at this time".  CT of Chest 10/20: showed progression of bilateral lower lung disease concerning for worsening superimposed infection versus worsening chronic interstitial lung disease.   Consult Palliative Care  to discuss his GOC overall as he continues to choose to eat/drink by mouth more so than use the PEG tube. Ongoing concern for "fistula" issues re: the Esophagus per chart  notes.   I will order incentive spirometry and place maintain aspiration precautions     DVT prophylaxis: SQ Lovenox Code Status: Full Family Communication: None today.  Offered to call but patient declined Disposition Plan: Status is: Inpatient Remains inpatient appropriate because: Multifocal pneumonia on antibiotics.  Acute on chronic hypoxic respiratory failure.  Tentative plan discharge home 11/2   Level of care: Med-Surg  Consultants:  Pulmonary  Procedures:  None  Antimicrobials: Augmentin   Subjective: Seen and examined.  Sitting comfortably in bed.  No visible distress.  No conversational dyspnea.  Objective: Vitals:   02/09/22 0850 02/09/22 1500 02/09/22 2013 02/10/22 0915  BP:  106/73 105/67 108/81  Pulse:  (!) 102 100 (!) 102  Resp:  18 (!) 24   Temp:  98.6 F (37 C) 98.2 F (36.8 C) 98.8 F (37.1 C)  TempSrc:  Oral Oral Oral  SpO2: 91% 93% 95% 92%  Weight:      Height:        Intake/Output Summary (Last 24 hours) at 02/10/2022 1435 Last data filed at 02/10/2022 1354 Gross per 24 hour  Intake 240 ml  Output 800 ml  Net -560 ml   Filed Weights   02/06/22 1953  Weight: 77.1 kg    Examination:  General exam: Appears calm and comfortable  Respiratory system: Bibasilar crackles.  Normal work of breathing.  4 L Cardiovascular system: S1-S2, RRR, no murmurs, no pedal edema Gastrointestinal system: Soft, NT/ND, + PEG Central nervous system: Alert and oriented. No focal neurological deficits. Extremities: Symmetric 5 x 5 power. Skin: No rashes, lesions or ulcers Psychiatry: Judgement and insight appear normal. Mood & affect appropriate.     Data Reviewed: I have personally reviewed following labs and imaging studies  CBC: Recent Labs  Lab 02/06/22 1954 02/07/22 0455 02/08/22 0552 02/09/22 0519 02/10/22 0649  WBC 4.7 4.0 4.0 5.7 4.5  NEUTROABS 3.3  --   --   --   --   HGB 14.6 13.8 12.8* 13.4 13.9  HCT 44.6 41.8 39.1 40.0 42.1  MCV 94.9  92.5 92.0 92.2 93.1  PLT 259 241 245 246 243   Basic Metabolic Panel: Recent Labs  Lab 02/06/22 1954 02/07/22 0455 02/08/22 0552 02/09/22 0519 02/10/22 0649  NA 139  --  138 139 135  K 4.4  --  4.7 3.8 4.1  CL 108  --  109 111 105  CO2 24  --  22 21* 25  GLUCOSE 86  --  100* 75 76  BUN 16  --  11 11 14   CREATININE 0.94 0.91 0.89 0.81 0.92  CALCIUM 9.1  --  8.8* 8.8* 9.0  MG  --   --  1.8  --   --   PHOS  --   --  4.4  --   --    GFR: Estimated Creatinine Clearance: 111.7 mL/min (by C-G formula based on SCr of 0.92 mg/dL). Liver Function Tests: Recent Labs  Lab 02/06/22 1954  AST 46*  ALT 43  ALKPHOS 46  BILITOT 0.5  PROT 8.6*  ALBUMIN 3.3*   No results for input(s): "LIPASE", "AMYLASE" in the last 168 hours. No results for input(s): "AMMONIA" in the last 168 hours. Coagulation Profile: No results for input(s): "INR", "PROTIME" in the last 168 hours. Cardiac  Enzymes: No results for input(s): "CKTOTAL", "CKMB", "CKMBINDEX", "TROPONINI" in the last 168 hours. BNP (last 3 results) No results for input(s): "PROBNP" in the last 8760 hours. HbA1C: No results for input(s): "HGBA1C" in the last 72 hours. CBG: No results for input(s): "GLUCAP" in the last 168 hours. Lipid Profile: No results for input(s): "CHOL", "HDL", "LDLCALC", "TRIG", "CHOLHDL", "LDLDIRECT" in the last 72 hours. Thyroid Function Tests: No results for input(s): "TSH", "T4TOTAL", "FREET4", "T3FREE", "THYROIDAB" in the last 72 hours. Anemia Panel: No results for input(s): "VITAMINB12", "FOLATE", "FERRITIN", "TIBC", "IRON", "RETICCTPCT" in the last 72 hours. Sepsis Labs: Recent Labs  Lab 02/06/22 2001 02/08/22 0017 02/08/22 0552 02/09/22 0519  PROCALCITON <0.10 <0.10 <0.10 <0.10    Recent Results (from the past 240 hour(s))  Resp Panel by RT-PCR (Flu A&B, Covid) Anterior Nasal Swab     Status: None   Collection Time: 02/06/22  7:57 PM   Specimen: Anterior Nasal Swab  Result Value Ref Range  Status   SARS Coronavirus 2 by RT PCR NEGATIVE NEGATIVE Final    Comment: (NOTE) SARS-CoV-2 target nucleic acids are NOT DETECTED.  The SARS-CoV-2 RNA is generally detectable in upper respiratory specimens during the acute phase of infection. The lowest concentration of SARS-CoV-2 viral copies this assay can detect is 138 copies/mL. A negative result does not preclude SARS-Cov-2 infection and should not be used as the sole basis for treatment or other patient management decisions. A negative result may occur with  improper specimen collection/handling, submission of specimen other than nasopharyngeal swab, presence of viral mutation(s) within the areas targeted by this assay, and inadequate number of viral copies(<138 copies/mL). A negative result must be combined with clinical observations, patient history, and epidemiological information. The expected result is Negative.  Fact Sheet for Patients:  BloggerCourse.com  Fact Sheet for Healthcare Providers:  SeriousBroker.it  This test is no t yet approved or cleared by the [place] FDA and  has been authorized for detection and/or diagnosis of SARS-CoV-2 by FDA under an Emergency Use Authorization (EUA). This EUA will remain  in effect (meaning this test can be used) for the duration of the COVID-19 declaration under Section 564(b)(1) of the Act, 21 U.S.C.section 360bbb-3(b)(1), unless the authorization is terminated  or revoked sooner.       Influenza A by PCR NEGATIVE NEGATIVE Final   Influenza B by PCR NEGATIVE NEGATIVE Final    Comment: (NOTE) The Xpert Xpress SARS-CoV-2/FLU/RSV plus assay is intended as an aid in the diagnosis of influenza from Nasopharyngeal swab specimens and should not be used as a sole basis for treatment. Nasal washings and aspirates are unacceptable for Xpert Xpress SARS-CoV-2/FLU/RSV testing.  Fact Sheet for  Patients: BloggerCourse.com  Fact Sheet for Healthcare Providers: SeriousBroker.it  This test is not yet approved or cleared by the [place] FDA and has been authorized for detection and/or diagnosis of SARS-CoV-2 by FDA under an Emergency Use Authorization (EUA). This EUA will remain in effect (meaning this test can be used) for the duration of the COVID-19 declaration under Section 564(b)(1) of the Act, 21 U.S.C. section 360bbb-3(b)(1), unless the authorization is terminated or revoked.  Performed at [facility], [address] [place], [place] [address]   SARS Coronavirus 2 by RT PCR (hospital order, performed in [facility] hospital lab) *cepheid single result test* Anterior Nasal Swab     Status: None   Collection Time: 02/06/22 11:32 PM   Specimen: Anterior Nasal Swab  Result Value Ref Range Status  SARS Coronavirus 2 by RT PCR NEGATIVE NEGATIVE Final    Comment: (NOTE) SARS-CoV-2 target nucleic acids are NOT DETECTED.  The SARS-CoV-2 RNA is generally detectable in upper and lower respiratory specimens during the acute phase of infection. The lowest concentration of SARS-CoV-2 viral copies this assay can detect is 250 copies / mL. A negative result does not preclude SARS-CoV-2 infection and should not be used as the sole basis for treatment or other patient management decisions.  A negative result may occur with improper specimen collection / handling, submission of specimen other than nasopharyngeal swab, presence of viral mutation(s) within the areas targeted by this assay, and inadequate number of viral copies (<250 copies / mL). A negative result must be combined with clinical observations, patient history, and epidemiological information.  Fact Sheet for Patients:   https://www.patel.info/  Fact Sheet for Healthcare Providers: https://hall.com/  This test  is not yet approved or  cleared by the [place] FDA and has been authorized for detection and/or diagnosis of SARS-CoV-2 by FDA under an Emergency Use Authorization (EUA).  This EUA will remain in effect (meaning this test can be used) for the duration of the COVID-19 declaration under Section 564(b)(1) of the Act, 21 U.S.C. section 360bbb-3(b)(1), unless the authorization is terminated or revoked sooner.  Performed at [facility], [address] [place], [place] [address]   Culture, blood (Routine X 2) w Reflex to ID Panel     Status: None (Preliminary result)   Collection Time: 02/07/22  4:56 AM   Specimen: BLOOD LEFT ARM  Result Value Ref Range Status   Specimen Description BLOOD LEFT ARM  Final   Special Requests   Final    BOTTLES DRAWN AEROBIC AND ANAEROBIC Blood Culture adequate volume   Culture   Final    NO GROWTH 3 DAYS Performed at [facility], [address], [place], [place] [address]    Report Status PENDING  Incomplete  Culture, blood (Routine X 2) w Reflex to ID Panel     Status: None (Preliminary result)   Collection Time: 02/07/22  4:56 AM   Specimen: BLOOD RIGHT ARM  Result Value Ref Range Status   Specimen Description BLOOD RIGHT ARM  Final   Special Requests   Final    BOTTLES DRAWN AEROBIC AND ANAEROBIC Blood Culture adequate volume   Culture   Final    NO GROWTH 3 DAYS Performed at [facility], [address], [place], [place] [address]    Report Status PENDING  Incomplete  MRSA Next Gen by PCR, Nasal     Status: None   Collection Time: 02/07/22  5:39 PM   Specimen: Nasal Mucosa; Nasal Swab  Result Value Ref Range Status   MRSA by PCR Next Gen NOT DETECTED NOT DETECTED Final    Comment: (NOTE) The GeneXpert MRSA Assay (FDA approved for NASAL specimens only), is one component of a comprehensive MRSA colonization surveillance program. It is not intended to diagnose MRSA infection nor to guide or monitor treatment  for MRSA infections. Test performance is not FDA approved in patients less than 23 years old. Performed at [facility], [address] [place], [place] [address]          Radiology Studies: DG Chest Port 1 View  Result Date: 02/09/2022 CLINICAL DATA:  Pleural effusion EXAM: PORTABLE CHEST 1 VIEW COMPARISON:  Chest x-ray dated February 06, 2022 FINDINGS: Visualized cardiac and mediastinal contours are unchanged. Bilateral patchy lower lung predominance  consolidations unchanged when compared with prior exam. Unchanged small bilateral pleural effusions. No evidence of pneumothorax. IMPRESSION: Unchanged bilateral patchy lower lung predominance consolidations and small bilateral pleural effusions. Electronically Signed   By: Allegra Lai M.D.   On: 02/09/2022 14:44        Scheduled Meds:  amoxicillin-clavulanate  1 tablet Oral Q12H   enoxaparin (LOVENOX) injection  40 mg Subcutaneous Q24H   fluticasone furoate-vilanterol  1 puff Inhalation Daily   And   umeclidinium bromide  1 puff Inhalation Daily   furosemide  40 mg Intravenous Daily   guaiFENesin  600 mg Oral BID   hydroxychloroquine  200 mg Per Tube BID   mycophenolate  1,000 mg Oral BID   pantoprazole  40 mg Oral Daily   [START ON 02/11/2022] predniSONE  40 mg Oral Q breakfast   Continuous Infusions:   LOS: 3 days      Tresa Moore, MD [facility]   If 7PM-7AM, please contact night-coverage  02/10/2022, 2:35 PM

## 2022-02-10 NOTE — Progress Notes (Signed)
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  Does have history of smoking with paraseptal emphysema with concomitant areas of likely interstitial lung disease and fibrotic changes worse at the bases bilaterally.  Additionally patient has been on multiple immunosuppressive agents including Humira, Plaquenil, steroids chronically, CellCept which do predispose him for infection.  He has been very difficult to wean from steroids and generally starts to flare underlying interstitial lung disease with worsening hypoxemia when steroids are tapered to less than 40 mg of prednisone.  He does respond fairly quickly to steroids and generally has improvement in oxygenation post glucocorticoid therapy.  Is also had recurrent effusions which have been drained via thoracentesis and have not been shown to be infected or parapneumonic/empyema.  Patient reports over past week he slowly but steadily continued to get weak and more dyspneic.  He went to [facility] in 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 He has been eating orally and has not been eating via PEG tube. He denies feeling flu like illness. He has been urinating well denies dysuria or dark urine.  Having normal well formed with intermittent loose stools.   Patient was asked to stop his Humira after most recent rheumatology evaluation with [name] and reports clinical decline after skipping his last injection.  On  admission in the ED he did not have additional CT chest with findings of consolidative infiltrates with associated volume loss as well as bilateral effusions worse on the right side, mild paraseptal emphysema, mediastinal adenopathy likely reactive or inflammatory, and enlargement of central pulmonary arteries in keeping with possible pulmonary hypertension.  Pulmonary consultation placed for additional evaluation management of complex relatively immunosuppressive patient with interstitial lung disease and systemic autoimmune disease.  Blood work does not show leukocytosis and stable anemia, he does share he has had loose stools over the last week, we did discuss possible infection and a stool PCR panel has been ordered.  Basic metabolic panel is essentially unremarkable with mild leukocytosis, normal renal function with GFR over 60.  Empirically patient is on vancomycin and cefepime, we discussed needing to possibly de-escalate as soon as infectious work-up comes back negative as previous.  02/08/22- patient is improved, reports less chest discomfort and dyspnea this am. He is weaned to 3L/min [place].  Procalcitonin is flat <0.10 renal function is normal and CBC with absence of leukocytosis.   02/09/22- patient had mild worsening with hypoxemia and increased O2 req.  He is eating PO as well and may have overlying aspiration. He has been diuresed and chest x ray shows a moderate effusion.   02/10/22- patient is slightly improved today.  We discussed using PEG but patient shares he has diarreah if he uses osmolyte. I have restarted his cellcept at 1g bid, plan to increase to 1.5g bid. His RF is markedly elevated.  His scleroderma panel is negative.   PAST MEDICAL  HISTORY   Past Medical History:  Diagnosis Date   H/O immunosuppressive therapy    Hyperpigmentation of skin    Hyperpigmentation rash/vesicles of the palm and face   Neutropenia (HCC)    RA (rheumatoid arthritis) ([name])      SURGICAL  HISTORY   Past Surgical History:  Procedure Laterality Date   ESOPHAGOGASTRODUODENOSCOPY N/A 09/03/2021   Procedure: ESOPHAGOGASTRODUODENOSCOPY (EGD);  Surgeon: Lesly Rubenstein, MD;  Location: [facility] ENDOSCOPY;  Service: Endoscopy;  Laterality: N/A;   IR GASTROSTOMY TUBE MOD SED  09/11/2021     FAMILY HISTORY   Family History  Problem Relation Age of Onset   Hyperlipidemia Mother    Hypertension Father      SOCIAL HISTORY   Social History   Tobacco Use   Smoking status: Former    Types: Cigarettes   Smokeless tobacco: Never  Vaping Use   Vaping Use: Never used  Substance Use Topics   Alcohol use: Not Currently   Drug use: Not Currently    Types: Marijuana     MEDICATIONS    Home Medication:     Current Medication:  Current Facility-Administered Medications:    acetaminophen (TYLENOL) tablet 650 mg, 650 mg, Oral, Q6H PRN, 650 mg at 02/08/22 1149 **OR** acetaminophen (TYLENOL) suppository 650 mg, 650 mg, Rectal, Q6H PRN, Athena Masse, MD   albuterol (PROVENTIL) (2.5 MG/3ML) 0.083% nebulizer solution 3 mL, 3 mL, Inhalation, Q6H PRN, Athena Masse, MD   amoxicillin-clavulanate (AUGMENTIN) 875-125 MG per tablet 1 tablet, 1 tablet, Oral, Q12H, Damonie Furney, MD, 1 tablet at 02/10/22 1055   enoxaparin (LOVENOX) injection 40 mg, 40 mg, Subcutaneous, Q24H, Duncan, Hazel V, MD   fluticasone furoate-vilanterol (BREO ELLIPTA) 100-25 MCG/ACT 1 puff, 1 puff, Inhalation, Daily, 1 puff at 02/08/22 0900 **AND** umeclidinium bromide (INCRUSE ELLIPTA) 62.5 MCG/ACT 1 puff, 1 puff, Inhalation, Daily, Wynelle Cleveland, RPH, 1 puff at 02/08/22 0901   furosemide (LASIX) injection 40 mg, 40 mg, Intravenous, Daily, Londell Noll, MD, 40 mg at 02/10/22 1100   guaiFENesin (MUCINEX) 12 hr tablet 600 mg, 600 mg, Oral, BID, Val Riles, MD, 600 mg at 02/10/22 1055   HYDROcodone bit-homatropine (HYCODAN) 5-1.5 MG/5ML syrup 5 mL, 5 mL, Oral, Q6H PRN, Val Riles, MD, 5 mL at 02/10/22  1056   hydroxychloroquine (PLAQUENIL) tablet 200 mg, 200 mg, Per Tube, BID, Judd Gaudier V, MD, 200 mg at 02/10/22 1100   ondansetron (ZOFRAN) tablet 4 mg, 4 mg, Oral, Q6H PRN **OR** ondansetron (ZOFRAN) injection 4 mg, 4 mg, Intravenous, Q6H PRN, Judd Gaudier V, MD, 4 mg at 02/07/22 0458   pantoprazole (PROTONIX) EC tablet 40 mg, 40 mg, Oral, Daily, Judd Gaudier V, MD, 40 mg at 02/10/22 1055   polyethylene glycol (MIRALAX / GLYCOLAX) packet 17 g, 17 g, Oral, Daily PRN, Athena Masse, MD   predniSONE (DELTASONE) tablet 50 mg, 50 mg, Oral, Q breakfast, Fujiko Picazo, MD, 50 mg at 02/10/22 0830   traMADol (ULTRAM) tablet 50 mg, 50 mg, Oral, Q6H PRN, Max Sane, MD, 50 mg at 02/09/22 2220    ALLERGIES   Patient has no known allergies.     REVIEW OF SYSTEMS    Review of Systems:  Gen:  Denies  fever, sweats, chills weigh loss  HEENT: Denies blurred vision, double vision, ear pain, eye pain, hearing loss, nose bleeds, sore throat Cardiac:  No dizziness, chest pain or heaviness, chest tightness,edema Resp:   reports dyspnea chronically  Gi: Denies swallowing difficulty, stomach pain,  nausea or vomiting, diarrhea, constipation, bowel incontinence Gu:  Denies bladder incontinence, burning urine Ext:   Denies Joint pain, stiffness or swelling Skin: Denies  skin rash, easy bruising or bleeding or hives Endoc:  Denies polyuria, polydipsia , polyphagia or weight change Psych:   Denies depression, insomnia or hallucinations   Other:  All other systems negative   VS: BP 108/81 (BP Location: Right Arm)   Pulse (!) 102   Temp 98.8 F (37.1 C) (Oral)   Resp (!) 24   Ht _0  (1.956 m)   Wt 77.1 kg   SpO2 92%   BMI 20.16 kg/m      PHYSICAL EXAM    GENERAL:NAD, no fevers, chills, no weakness no fatigue HEAD: Normocephalic, atraumatic.  EYES: Pupils equal, round, reactive to light. Extraocular muscles intact. No scleral icterus.  MOUTH: Moist mucosal membrane. Dentition  intact. No abscess noted.  EAR, NOSE, THROAT: Clear without exudates. No external lesions.  NECK: Supple. No thyromegaly. No nodules. No JVD.  PULMONARY: decreased breath sounds with mild rhonchi worse at bases bilaterally.  CARDIOVASCULAR: S1 and S2. Regular rate and rhythm. No murmurs, rubs, or gallops. No edema. Pedal pulses 2+ bilaterally.  GASTROINTESTINAL: Soft, nontender, nondistended. No masses. Positive bowel sounds. No hepatosplenomegaly.  MUSCULOSKELETAL: No swelling, clubbing, or edema. Range of motion full in all extremities.  NEUROLOGIC: Cranial nerves II through XII are intact. No gross focal neurological deficits. Sensation intact. Reflexes intact.  SKIN: No ulceration, lesions, rashes, or cyanosis. Skin warm and dry. Turgor intact.  PSYCHIATRIC: Mood, affect within normal limits. The patient is awake, alert and oriented x 3. Insight, judgment intact.       IMAGING           Narrative & Impression  CLINICAL DATA:  Esophageal perforation, progressive hypoxia, cough, chest pain, dyspnea.   EXAM: CT CHEST WITH CONTRAST   TECHNIQUE: Multidetector CT imaging of the chest was performed during intravenous contrast administration.   RADIATION DOSE REDUCTION: This exam was performed according to the departmental dose-optimization program which includes automated exposure control, adjustment of the mA and/or kV according to patient size and/or use of iterative reconstruction technique.   CONTRAST:  81m OMNIPAQUE IOHEXOL 300 MG/ML  SOLN   COMPARISON:  01/29/2022, 09/01/2021   FINDINGS: Cardiovascular: No significant coronary artery calcification. Global cardiac size within normal limits. No pericardial effusion. Central pulmonary arteries are enlarged, similar to prior examination, in keeping with changes of pulmonary arterial hypertension. The thoracic aorta is unremarkable.   Mediastinum/Nodes: Pathologic thoracic adenopathy predominantly within the aorta  pulmonary window appears stable since prior examination with the index lymph nodes measuring 12-15 mm in short axis diameter. Visualized thyroid is unremarkable. The esophagus is unremarkable. No pneumomediastinum.   Lungs/Pleura: Mild paraseptal emphysema. Bibasilar pulmonary consolidative infiltrates with associated volume loss appears stable since prior examination. Numerous superimposed cysts are identified, similar to prior examination, as well as scattered inflammatory appearing centrilobular nodules and, together, the findings raise the question of an underlying interstitial lung disease such as lymphocytic interstitial pneumonia. Necrotizing infection, however, could appear similarly, as can be seen with recurrent aspiration. No pneumothorax. Small bilateral pleural effusions are again identified. No central obstructing lesion.   Upper Abdomen: No acute abnormality.   Musculoskeletal: No chest wall abnormality. No acute or significant osseous findings.   IMPRESSION: 1. Stable bibasilar pulmonary consolidative infiltrates with associated volume loss. Numerous superimposed cysts are similar to prior examination, as well as scattered inflammatory appearing centrilobular nodules and, together,  the findings raise the question of an underlying interstitial lung disease such as lymphocytic interstitial pneumonia. Necrotizing infection, however, could appear similarly, as can be seen with recurrent aspiration. 2. Stable small bilateral pleural effusions. 3. Mild paraseptal emphysema. 4. Stable mediastinal adenopathy, possibly reactive or inflammatory. 5. Stable enlargement of the central pulmonary arteries in keeping with changes of pulmonary arterial hypertension.     ASSESSMENT/PLAN   Acute on hypoxemic respiratory failure - present on admission  - COVID19 negative  - supplemental O2 during my evaluation 4L/min>>>3L - will perform infectious workup for  pneumonia -Respiratory viral panel- negative  -serum fungitell -legionella ab -strep pneumoniae ur AG -Histoplasma Ur Ag -sputum resp cultures- negative to date  -AFB sputum expectorated specimen -sputum cytology  -reviewed pertinent imaging with patient today - ESR/CRP/RF- elevated  -PT/OT for d/c planning  -please encourage patient to use incentive spirometer few times each hour while hospitalized.       Esophageal perforation with PEG status -Patient is status post GI and ENT evaluation, states that he has been cleared to have p.o. fluids and ulcer is well-healing after most recent antimicrobials.  He stopped using his PEG tube and was eating normally p.o. this point.  His most recent swallow evaluation was abnormal he was recommended to have no oral nourishment.   Rheumatoid arthritis with acute exacerbation of interstitial lung disease -This appears to have occurred after cessation of Humira.  Patient has finally been able to wean down on prednisone therapy down to 10 mg for the first time in over a year however after he was recommended to DC Humira he reports progressive decline over period of 1 to 2 weeks with increased O2 requirement and resultant hospitalization.  I have restarted Solu-Medrol at this time.  Patient is able to speak in full sentences and is not having labored breathing and is not in distress during my evaluation. -Agree with continuing Plaquenil -Patient recently had ophthalmology evaluation and it was recommended for him to continue this medication. -Continue solumedrol 4m until 02/09/22 then may dc home with pred 529mto be tapered by 79m61mvery day.  Thank you for allowing me to participate in the care of this patient.   Patient/Family are satisfied with care plan and all questions have been answered.    Provider disclosure: Patient with at least one acute or chronic illness or injury that poses a threat to life or bodily function and is being managed  actively during this encounter.  All of the below services have been performed independently by signing provider:  review of prior documentation from internal and or external health records.  Review of previous and current lab results.  Interview and comprehensive assessment during patient visit today. Review of current and previous chest radiographs/CT scans. Discussion of management and test interpretation with health care team and patient/family.   This document was prepared using Dragon voice recognition software and may include unintentional dictation errors.     FuaOttie Glazier.D.  Division of Pulmonary & Critical Care Medicine

## 2022-02-11 ENCOUNTER — Inpatient Hospital Stay: Payer: 59

## 2022-02-11 DIAGNOSIS — J189 Pneumonia, unspecified organism: Secondary | ICD-10-CM | POA: Diagnosis not present

## 2022-02-11 LAB — BASIC METABOLIC PANEL
Anion gap: 7 (ref 5–15)
BUN: 18 mg/dL (ref 6–20)
CO2: 23 mmol/L (ref 22–32)
Calcium: 9.1 mg/dL (ref 8.9–10.3)
Chloride: 106 mmol/L (ref 98–111)
Creatinine, Ser: 0.88 mg/dL (ref 0.61–1.24)
GFR, Estimated: >60 mL/min (ref >60)
Glucose, Bld: 105 mg/dL — ABNORMAL HIGH (ref 70–99)
Potassium: 3.8 mmol/L (ref 3.5–5.1)
Sodium: 136 mmol/L (ref 135–145)

## 2022-02-11 LAB — CBC
HCT: 40.4 % (ref 39.0–52.0)
Hemoglobin: 13.5 g/dL (ref 13.0–17.0)
MCH: 30.8 pg (ref 26.0–34.0)
MCHC: 33.4 g/dL (ref 30.0–36.0)
MCV: 92 fL (ref 80.0–100.0)
Platelets: 238 10*3/uL (ref 150–400)
RBC: 4.39 MIL/uL (ref 4.22–5.81)
RDW: 12.8 % (ref 11.5–15.5)
WBC: 4.2 10*3/uL (ref 4.0–10.5)
nRBC: 0 % (ref 0.0–0.2)

## 2022-02-11 MED ORDER — PREDNISONE 20 MG PO TABS
35.0000 mg | ORAL_TABLET | Freq: Every day | ORAL | Status: DC
  Administered 2022-02-12: 35 mg via ORAL
  Filled 2022-02-11: qty 2

## 2022-02-11 MED ORDER — ALBUTEROL SULFATE (2.5 MG/3ML) 0.083% IN NEBU: 2.5000 mg | INHALATION_SOLUTION | Freq: Four times a day (QID) | RESPIRATORY_TRACT | Status: DC | PRN

## 2022-02-11 MED ORDER — MYCOPHENOLATE MOFETIL 250 MG PO CAPS
1500.0000 mg | ORAL_CAPSULE | Freq: Two times a day (BID) | ORAL | Status: DC
  Administered 2022-02-11 – 2022-02-12 (×2): 1500 mg via ORAL
  Filled 2022-02-11 (×2): qty 6

## 2022-02-11 NOTE — Progress Notes (Signed)
PROGRESS NOTE    Sean [name]  IOE:[id number] DOB: [birth year] DOA: 02/06/2022 PCP: [name], MD    Brief Narrative:  46 y.o. 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  Readmitted for acute hypoxic respiratory failure likely due to pneumonia   10/29: Pulmonary consult, currently on 4 L oxygen 10/30: Weaned off to room air 10/31: Oxygen requirement increased to 4.5 L as he was hypoxic in mid 80s on 3 L earlier.  Started Lasix 11/1: Weaned to 4 L 11/2: Weaned to baseline 3L.  Dizzy when ambulating.  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  Was weaned off to room air however subsequently became hypoxic again.  Weaned to baseline 3L on 11/2.  Dizzy when standing.  Will request therapy evals and possible dc tomorrow AM.  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  Plaquenil and prednisone on active MAR     ILD (interstitial lung disease) (HCC) Flare could be due to cessation of Humira.  Continue Plaquenil.  Responded well to steroid.  Continue prednisone with daily taper   History of esophageal perforation Eating orally.  Patient continues to ignore ST recommendation.  Per speech therapy   Pt has a complicated history and has been recommended to be NPO per the last 2 MBSSs AND a recent visit to [name] (w/ MBSS too per pt) rec'd continued use of PEG d/t the perforation NOT healing at this time -- "communication between the proximal pharyngo-Esophageal wall (posterior wall) resulting in contrast moving into the retropharyngeal space.", per ENT/MDs/chart notes.   Per chart note last admit on 01/29/22: "Last saw [name]NT on October 13, at that time imaging was reviewed that continued to show an esophageal perforation, however surgery was not felt to be inherently helpful, and after review of all his options decision made to think about it and reassess, do not want to remove his PEG at this time".  CT of Chest 10/20: showed progression of bilateral lower  lung disease concerning for worsening superimposed infection versus worsening chronic interstitial lung disease.   Consult Palliative Care to discuss his GOC overall as he continues to choose to eat/drink by  mouth more so than use the PEG tube. Ongoing concern for "fistula" issues re: the Esophagus per chart notes.   I will order incentive spirometry and place maintain aspiration precautions     DVT prophylaxis: SQ Lovenox Code Status: Full Family Communication: None today.  Offered to call but patient declined Disposition Plan: Status is: Inpatient Remains inpatient appropriate because: Multifocal pneumonia on antibiotics.  Acute on chronic hypoxic respiratory failure.  Improving.  Will request therapy evals and optimize for dc tomorrow AM.   Level of care: Med-Surg  Consultants:  Pulmonary  Procedures:  None  Antimicrobials: Augmentin   Subjective: Seen and examined.  Sitting comfortably in bed.  No visible distress.  No conversational dyspnea.  Objective: Vitals:   02/11/22 0528 02/11/22 0741 02/11/22 0836 02/11/22 0900  BP: 102/71 109/70    Pulse: 92 94    Resp: 16 18    Temp: 99.1 F (37.3 C) 98.2 F (36.8 C)    TempSrc: Oral Oral    SpO2: 95% 95% 93% 95%  Weight:      Height:       No intake or output data in the 24 hours ending 02/11/22 1426  Filed Weights   02/06/22 1953  Weight: 77.1 kg    Examination:  General exam: NAD Respiratory system: Bibasilar crackles.  Normal WOB.  3L Cardiovascular system: S1-S2, RRR, no murmurs, no pedal edema Gastrointestinal system: Soft, NT/ND, + PEG Central nervous system: Alert and oriented. No focal neurological deficits. Extremities: Symmetric 5 x 5 power. Skin: No rashes, lesions or ulcers Psychiatry: Judgement and insight appear normal. Mood & affect appropriate.     Data Reviewed: I have personally reviewed following labs and imaging studies  CBC: Recent Labs  Lab 02/06/22 1954 02/07/22 0455 02/08/22 0552 02/09/22 0519 02/10/22 0649 02/11/22 0607  WBC 4.7 4.0 4.0 5.7 4.5 4.2  NEUTROABS 3.3  --   --   --   --   --   HGB 14.6 13.8 12.8* 13.4 13.9 13.5  HCT 44.6 41.8 39.1 40.0 42.1 40.4  MCV 94.9 92.5  92.0 92.2 93.1 92.0  PLT 259 241 245 246 243 585   Basic Metabolic Panel: Recent Labs  Lab 02/06/22 1954 02/07/22 0455 02/08/22 0552 02/09/22 0519 02/10/22 0649 02/11/22 0607  NA 139  --  138 139 135 136  K 4.4  --  4.7 3.8 4.1 3.8  CL 108  --  109 111 105 106  CO2 24  --  22 21* 25 23  GLUCOSE 86  --  100* 75 76 105*  BUN 16  --  11 11 14 18   CREATININE 0.94 0.91 0.89 0.81 0.92 0.88  CALCIUM 9.1  --  8.8* 8.8* 9.0 9.1  MG  --   --  1.8  --   --   --   PHOS  --   --  4.4  --   --   --    GFR: Estimated Creatinine Clearance: 116.8 mL/min (by C-G formula based on SCr of 0.88 mg/dL). Liver Function Tests: Recent Labs  Lab 02/06/22 1954  AST 46*  ALT 43  ALKPHOS 46  BILITOT 0.5  PROT 8.6*  ALBUMIN 3.3*   No results for input(s): "LIPASE", "AMYLASE" in the last 168 hours. No results for input(s): "AMMONIA" in  the last 168 hours. Coagulation Profile: No results for input(s): "INR", "PROTIME" in the last 168 hours. Cardiac Enzymes: No results for input(s): "CKTOTAL", "CKMB", "CKMBINDEX", "TROPONINI" in the last 168 hours. BNP (last 3 results) No results for input(s): "PROBNP" in the last 8760 hours. HbA1C: No results for input(s): "HGBA1C" in the last 72 hours. CBG: No results for input(s): "GLUCAP" in the last 168 hours. Lipid Profile: No results for input(s): "CHOL", "HDL", "LDLCALC", "TRIG", "CHOLHDL", "LDLDIRECT" in the last 72 hours. Thyroid Function Tests: No results for input(s): "TSH", "T4TOTAL", "FREET4", "T3FREE", "THYROIDAB" in the last 72 hours. Anemia Panel: No results for input(s): "VITAMINB12", "FOLATE", "FERRITIN", "TIBC", "IRON", "RETICCTPCT" in the last 72 hours. Sepsis Labs: Recent Labs  Lab 02/06/22 2001 02/08/22 0017 02/08/22 0552 02/09/22 0519  PROCALCITON <0.10 <0.10 <0.10 <0.10    Recent Results (from the past 240 hour(s))  Resp Panel by RT-PCR (Flu A&B, Covid) Anterior Nasal Swab     Status: None   Collection Time: 02/06/22  7:57 PM    Specimen: Anterior Nasal Swab  Result Value Ref Range Status   SARS Coronavirus 2 by RT PCR NEGATIVE NEGATIVE Final    Comment: (NOTE) SARS-CoV-2 target nucleic acids are NOT DETECTED.  The SARS-CoV-2 RNA is generally detectable in upper respiratory specimens during the acute phase of infection. The lowest concentration of SARS-CoV-2 viral copies this assay can detect is 138 copies/mL. A negative result does not preclude SARS-Cov-2 infection and should not be used as the sole basis for treatment or other patient management decisions. A negative result may occur with  improper specimen collection/handling, submission of specimen other than nasopharyngeal swab, presence of viral mutation(s) within the areas targeted by this assay, and inadequate number of viral copies(<138 copies/mL). A negative result must be combined with clinical observations, patient history, and epidemiological information. The expected result is Negative.  Fact Sheet for Patients:  BloggerCourse.com  Fact Sheet for Healthcare Providers:  SeriousBroker.it  This test is no t yet approved or cleared by the [place] FDA and  has been authorized for detection and/or diagnosis of SARS-CoV-2 by FDA under an Emergency Use Authorization (EUA). This EUA will remain  in effect (meaning this test can be used) for the duration of the COVID-19 declaration under Section 564(b)(1) of the Act, 21 U.S.C.section 360bbb-3(b)(1), unless the authorization is terminated  or revoked sooner.       Influenza A by PCR NEGATIVE NEGATIVE Final   Influenza B by PCR NEGATIVE NEGATIVE Final    Comment: (NOTE) The Xpert Xpress SARS-CoV-2/FLU/RSV plus assay is intended as an aid in the diagnosis of influenza from Nasopharyngeal swab specimens and should not be used as a sole basis for treatment. Nasal washings and aspirates are unacceptable for Xpert Xpress  SARS-CoV-2/FLU/RSV testing.  Fact Sheet for Patients: BloggerCourse.com  Fact Sheet for Healthcare Providers: SeriousBroker.it  This test is not yet approved or cleared by the [place] FDA and has been authorized for detection and/or diagnosis of SARS-CoV-2 by FDA under an Emergency Use Authorization (EUA). This EUA will remain in effect (meaning this test can be used) for the duration of the COVID-19 declaration under Section 564(b)(1) of the Act, 21 U.S.C. section 360bbb-3(b)(1), unless the authorization is terminated or revoked.  Performed at [facility], [address] [place], [place] [address]   SARS Coronavirus 2 by RT PCR (hospital order, performed in  Hospital hospital lab) *cepheid single result test* Anterior Nasal Swab     Status: None  Collection Time: 02/06/22 11:32 PM   Specimen: Anterior Nasal Swab  Result Value Ref Range Status   SARS Coronavirus 2 by RT PCR NEGATIVE NEGATIVE Final    Comment: (NOTE) SARS-CoV-2 target nucleic acids are NOT DETECTED.  The SARS-CoV-2 RNA is generally detectable in upper and lower respiratory specimens during the acute phase of infection. The lowest concentration of SARS-CoV-2 viral copies this assay can detect is 250 copies / mL. A negative result does not preclude SARS-CoV-2 infection and should not be used as the sole basis for treatment or other patient management decisions.  A negative result may occur with improper specimen collection / handling, submission of specimen other than nasopharyngeal swab, presence of viral mutation(s) within the areas targeted by this assay, and inadequate number of viral copies (<250 copies / mL). A negative result must be combined with clinical observations, patient history, and epidemiological information.  Fact Sheet for Patients:   RoadLapTop.co.za  Fact Sheet for Healthcare  Providers: http://kim-miller.com/  This test is not yet approved or  cleared by the [place] FDA and has been authorized for detection and/or diagnosis of SARS-CoV-2 by FDA under an Emergency Use Authorization (EUA).  This EUA will remain in effect (meaning this test can be used) for the duration of the COVID-19 declaration under Section 564(b)(1) of the Act, 21 U.S.C. section 360bbb-3(b)(1), unless the authorization is terminated or revoked sooner.  Performed at [facility], [address] [place], [place] [address]   Culture, blood (Routine X 2) w Reflex to ID Panel     Status: None (Preliminary result)   Collection Time: 02/07/22  4:56 AM   Specimen: BLOOD LEFT ARM  Result Value Ref Range Status   Specimen Description BLOOD LEFT ARM  Final   Special Requests   Final    BOTTLES DRAWN AEROBIC AND ANAEROBIC Blood Culture adequate volume   Culture   Final    NO GROWTH 4 DAYS Performed at [facility], [address], [place], [place] [address]    Report Status PENDING  Incomplete  Culture, blood (Routine X 2) w Reflex to ID Panel     Status: None (Preliminary result)   Collection Time: 02/07/22  4:56 AM   Specimen: BLOOD RIGHT ARM  Result Value Ref Range Status   Specimen Description BLOOD RIGHT ARM  Final   Special Requests   Final    BOTTLES DRAWN AEROBIC AND ANAEROBIC Blood Culture adequate volume   Culture   Final    NO GROWTH 4 DAYS Performed at [facility], [address], [place], [place] [address]    Report Status PENDING  Incomplete  MRSA Next Gen by PCR, Nasal     Status: None   Collection Time: 02/07/22  5:39 PM   Specimen: Nasal Mucosa; Nasal Swab  Result Value Ref Range Status   MRSA by PCR Next Gen NOT DETECTED NOT DETECTED Final    Comment: (NOTE) The GeneXpert MRSA Assay (FDA approved for NASAL specimens only), is one component of a comprehensive MRSA colonization surveillance program. It is not  intended to diagnose MRSA infection nor to guide or monitor treatment for MRSA infections. Test performance is not FDA approved in patients less than 85 years old. Performed at [facility], [address], [place], [place] [address]          Radiology Studies: [facility] Chest [place] 1 View  Result Date: 02/11/2022 CLINICAL DATA:  Follow-up pleural effusion EXAM: PORTABLE CHEST 1 VIEW COMPARISON:  02/09/2022 FINDINGS: Cardiac shadow is stable. Patchy airspace opacities are noted in the bases overall stable in appearance from the prior exam given some technical variations in the film. No new focal infiltrate is seen. Small effusions are again noted. Bony structures are within normal limits. IMPRESSION: No change from the previous exam. Electronically Signed   By: Alcide Clever M.D.   On: 02/11/2022 10:21   DG Chest Port 1 View  Result Date: 02/09/2022 CLINICAL DATA:  Pleural effusion EXAM: PORTABLE CHEST 1 VIEW COMPARISON:  Chest x-ray dated February 06, 2022 FINDINGS: Visualized cardiac and mediastinal contours are unchanged. Bilateral patchy lower lung predominance consolidations unchanged when compared with prior exam. Unchanged small bilateral pleural effusions. No evidence of pneumothorax. IMPRESSION: Unchanged bilateral patchy lower lung predominance consolidations and small bilateral pleural effusions. Electronically Signed   By: Allegra Lai M.D.   On: 02/09/2022 14:44        Scheduled Meds:  amoxicillin-clavulanate  1 tablet Oral Q12H   enoxaparin (LOVENOX) injection  40 mg Subcutaneous Q24H   furosemide  40 mg Intravenous Daily   guaiFENesin  600 mg Oral BID   hydroxychloroquine  200 mg Per Tube BID   mycophenolate  1,500 mg Oral BID   pantoprazole  40 mg Oral Daily   [START ON 02/12/2022] predniSONE  35 mg Oral Q breakfast   sulfamethoxazole-trimethoprim  1 tablet Oral Once per day on Mon Wed Fri   Continuous Infusions:   LOS: 4 days      Tresa Moore,  MD [facility]   If 7PM-7AM, please contact night-coverage  02/11/2022, 2:26 PM

## 2022-02-11 NOTE — Plan of Care (Signed)

## 2022-02-11 NOTE — Progress Notes (Signed)
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  Does have history of smoking with paraseptal emphysema with concomitant areas of likely interstitial lung disease and fibrotic changes worse at the bases bilaterally.  Additionally patient has been on multiple immunosuppressive agents including Humira, Plaquenil, steroids chronically, CellCept which do predispose him for infection.  He has been very difficult to wean from steroids and generally starts to flare underlying interstitial lung disease with worsening hypoxemia when steroids are tapered to less than 40 mg of prednisone.  He does respond fairly quickly to steroids and generally has improvement in oxygenation post glucocorticoid therapy.  Is also had recurrent effusions which have been drained via thoracentesis and have not been shown to be infected or parapneumonic/empyema.  Patient reports over past week he slowly but steadily continued to get weak and more dyspneic.  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 He has been eating orally and has not been eating via PEG tube. He denies feeling flu like illness. He has been urinating well denies dysuria or dark urine.  Having normal well formed with intermittent loose stools.   Patient was asked to stop his Humira after most recent rheumatology evaluation with [name] and reports clinical decline after skipping his last injection.  On  admission in the ED he did not have additional CT chest with findings of consolidative infiltrates with associated volume loss as well as bilateral effusions worse on the right side, mild paraseptal emphysema, mediastinal adenopathy likely reactive or inflammatory, and enlargement of central pulmonary arteries in keeping with possible pulmonary hypertension.  Pulmonary consultation placed for additional evaluation management of complex relatively immunosuppressive patient with interstitial lung disease and systemic autoimmune disease.  Blood work does not show leukocytosis and stable anemia, he does share he has had loose stools over the last week, we did discuss possible infection and a stool PCR panel has been ordered.  Basic metabolic panel is essentially unremarkable with mild leukocytosis, normal renal function with GFR over 60.  Empirically patient is on vancomycin and cefepime, we discussed needing to possibly de-escalate as soon as infectious work-up comes back negative as previous.  02/08/22- patient is improved, reports less chest discomfort and dyspnea this am. He is weaned to 3L/min [place].  Procalcitonin is flat <0.10 renal function is normal and CBC with absence of leukocytosis.   02/09/22- patient had mild worsening with hypoxemia and increased O2 req.  He is eating PO as well and may have overlying aspiration. He has been diuresed and chest x ray shows a moderate effusion.   02/10/22- patient is slightly improved today.  We discussed using PEG but patient shares he has diarreah if he uses osmolyte. I have restarted his cellcept at 1g bid, plan to increase to 1.5g bid. His RF is markedly elevated.  His scleroderma panel is negative.   02/11/22- patient  diuresed another 2L overnight. He is close to his baseline now at 3.5L/min.  He got up and took shower but reports dizziness.  He will have PT/OT and optimize for discharge.    PAST MEDICAL HISTORY   Past Medical History:  Diagnosis Date    H/O immunosuppressive therapy    Hyperpigmentation of skin    Hyperpigmentation rash/vesicles of the palm and face   Neutropenia (HCC)    RA (rheumatoid arthritis) ([place])      SURGICAL HISTORY   Past Surgical History:  Procedure Laterality Date   ESOPHAGOGASTRODUODENOSCOPY N/A 09/03/2021   Procedure: ESOPHAGOGASTRODUODENOSCOPY (EGD);  Surgeon: Lesly Rubenstein, MD;  Location: [facility] ENDOSCOPY;  Service: Endoscopy;  Laterality: N/A;   IR GASTROSTOMY TUBE MOD SED  09/11/2021     FAMILY HISTORY   Family History  Problem Relation Age of Onset   Hyperlipidemia Mother    Hypertension Father      SOCIAL HISTORY   Social History   Tobacco Use   Smoking status: Former    Types: Cigarettes   Smokeless tobacco: Never  Vaping Use   Vaping Use: Never used  Substance Use Topics   Alcohol use: Not Currently   Drug use: Not Currently    Types: Marijuana     MEDICATIONS    Home Medication:     Current Medication:  Current Facility-Administered Medications:    acetaminophen (TYLENOL) tablet 650 mg, 650 mg, Oral, Q6H PRN, 650 mg at 02/08/22 1149 **OR** acetaminophen (TYLENOL) suppository 650 mg, 650 mg, Rectal, Q6H PRN, Judd Gaudier V, MD   albuterol (PROVENTIL) (2.5 MG/3ML) 0.083% nebulizer solution 2.5 mg, 2.5 mg, Nebulization, Q6H PRN, Priscella Mann, Sudheer B, MD   amoxicillin-clavulanate (AUGMENTIN) 875-125 MG per tablet 1 tablet, 1 tablet, Oral, Q12H, Lanney Gins, Maisee Vollman, MD, 1 tablet at 02/11/22 0854   enoxaparin (LOVENOX) injection 40 mg, 40 mg, Subcutaneous, Q24H, Damita Dunnings, Hazel V, MD   furosemide (LASIX) injection 40 mg, 40 mg, Intravenous, Daily, Lanney Gins, Allysen Lazo, MD, 40 mg at 02/11/22 0855   guaiFENesin (MUCINEX) 12 hr tablet 600 mg, 600 mg, Oral, BID, Val Riles, MD, 600 mg at 02/11/22 0855   HYDROcodone bit-homatropine (HYCODAN) 5-1.5 MG/5ML syrup 5 mL, 5 mL, Oral, Q6H PRN, Val Riles, MD, 5 mL at 02/10/22 1056   hydroxychloroquine (PLAQUENIL) tablet 200 mg, 200 mg,  Per Tube, BID, Judd Gaudier V, MD, 200 mg at 02/11/22 0855   mycophenolate (CELLCEPT) capsule 1,000 mg, 1,000 mg, Oral, BID, Lanney Gins, Aurea Aronov, MD, 1,000 mg at 02/11/22 0855   ondansetron (ZOFRAN) tablet 4 mg, 4 mg, Oral, Q6H PRN **OR** ondansetron (ZOFRAN) injection 4 mg, 4 mg, Intravenous, Q6H PRN, Judd Gaudier V, MD, 4 mg at 02/07/22 0458   pantoprazole (PROTONIX) EC tablet 40 mg, 40 mg, Oral, Daily, Judd Gaudier V, MD, 40 mg at 02/11/22 0854   polyethylene glycol (MIRALAX / GLYCOLAX) packet 17 g, 17 g, Oral, Daily PRN, Athena Masse, MD   [START ON 02/12/2022] predniSONE (DELTASONE) tablet 35 mg, 35 mg, Oral, Q breakfast, Sreenath, Sudheer B, MD   sulfamethoxazole-trimethoprim (BACTRIM DS) 800-160 MG per tablet 1 tablet, 1 tablet, Oral, Once per day on Mon Wed Fri, Jaleel Allen, MD, 1 tablet at 02/10/22 2214   traMADol (ULTRAM) tablet 50 mg, 50 mg, Oral, Q6H PRN, Max Sane, MD, 50 mg at 02/11/22 3825    ALLERGIES   Patient has no known allergies.     REVIEW OF SYSTEMS    Review of Systems:  Gen:  Denies  fever, sweats,  chills weigh loss  HEENT: Denies blurred vision, double vision, ear pain, eye pain, hearing loss, nose bleeds, sore throat Cardiac:  No dizziness, chest pain or heaviness, chest tightness,edema Resp:   reports dyspnea chronically  Gi: Denies swallowing difficulty, stomach pain, nausea or vomiting, diarrhea, constipation, bowel incontinence Gu:  Denies bladder incontinence, burning urine Ext:   Denies Joint pain, stiffness or swelling Skin: Denies  skin rash, easy bruising or bleeding or hives Endoc:  Denies polyuria, polydipsia , polyphagia or weight change Psych:   Denies depression, insomnia or hallucinations   Other:  All other systems negative   VS: BP 109/70 (BP Location: Left Arm)   Pulse 94   Temp 98.2 F (36.8 C) (Oral)   Resp 18   Ht _0  (1.956 m)   Wt 77.1 kg   SpO2 95%   BMI 20.16 kg/m      PHYSICAL EXAM    GENERAL:NAD, no  fevers, chills, no weakness no fatigue HEAD: Normocephalic, atraumatic.  EYES: Pupils equal, round, reactive to light. Extraocular muscles intact. No scleral icterus.  MOUTH: Moist mucosal membrane. Dentition intact. No abscess noted.  EAR, NOSE, THROAT: Clear without exudates. No external lesions.  NECK: Supple. No thyromegaly. No nodules. No JVD.  PULMONARY: decreased breath sounds with mild rhonchi worse at bases bilaterally.  CARDIOVASCULAR: S1 and S2. Regular rate and rhythm. No murmurs, rubs, or gallops. No edema. Pedal pulses 2+ bilaterally.  GASTROINTESTINAL: Soft, nontender, nondistended. No masses. Positive bowel sounds. No hepatosplenomegaly.  MUSCULOSKELETAL: No swelling, clubbing, or edema. Range of motion full in all extremities.  NEUROLOGIC: Cranial nerves II through XII are intact. No gross focal neurological deficits. Sensation intact. Reflexes intact.  SKIN: No ulceration, lesions, rashes, or cyanosis. Skin warm and dry. Turgor intact.  PSYCHIATRIC: Mood, affect within normal limits. The patient is awake, alert and oriented x 3. Insight, judgment intact.       IMAGING           Narrative & Impression  CLINICAL DATA:  Esophageal perforation, progressive hypoxia, cough, chest pain, dyspnea.   EXAM: CT CHEST WITH CONTRAST   TECHNIQUE: Multidetector CT imaging of the chest was performed during intravenous contrast administration.   RADIATION DOSE REDUCTION: This exam was performed according to the departmental dose-optimization program which includes automated exposure control, adjustment of the mA and/or kV according to patient size and/or use of iterative reconstruction technique.   CONTRAST:  27m OMNIPAQUE IOHEXOL 300 MG/ML  SOLN   COMPARISON:  01/29/2022, 09/01/2021   FINDINGS: Cardiovascular: No significant coronary artery calcification. Global cardiac size within normal limits. No pericardial effusion. Central pulmonary arteries are enlarged,  similar to prior examination, in keeping with changes of pulmonary arterial hypertension. The thoracic aorta is unremarkable.   Mediastinum/Nodes: Pathologic thoracic adenopathy predominantly within the aorta pulmonary window appears stable since prior examination with the index lymph nodes measuring 12-15 mm in short axis diameter. Visualized thyroid is unremarkable. The esophagus is unremarkable. No pneumomediastinum.   Lungs/Pleura: Mild paraseptal emphysema. Bibasilar pulmonary consolidative infiltrates with associated volume loss appears stable since prior examination. Numerous superimposed cysts are identified, similar to prior examination, as well as scattered inflammatory appearing centrilobular nodules and, together, the findings raise the question of an underlying interstitial lung disease such as lymphocytic interstitial pneumonia. Necrotizing infection, however, could appear similarly, as can be seen with recurrent aspiration. No pneumothorax. Small bilateral pleural effusions are again identified. No central obstructing lesion.   Upper Abdomen: No acute abnormality.   Musculoskeletal:  No chest wall abnormality. No acute or significant osseous findings.   IMPRESSION: 1. Stable bibasilar pulmonary consolidative infiltrates with associated volume loss. Numerous superimposed cysts are similar to prior examination, as well as scattered inflammatory appearing centrilobular nodules and, together, the findings raise the question of an underlying interstitial lung disease such as lymphocytic interstitial pneumonia. Necrotizing infection, however, could appear similarly, as can be seen with recurrent aspiration. 2. Stable small bilateral pleural effusions. 3. Mild paraseptal emphysema. 4. Stable mediastinal adenopathy, possibly reactive or inflammatory. 5. Stable enlargement of the central pulmonary arteries in keeping with changes of pulmonary arterial hypertension.      ASSESSMENT/PLAN   Acute on hypoxemic respiratory failure - present on admission  - COVID19 negative  - supplemental O2 during my evaluation 4L/min>>>3L - will perform infectious workup for pneumonia -Respiratory viral panel- negative  -serum fungitell -legionella ab -strep pneumoniae ur AG -Histoplasma Ur Ag -sputum resp cultures- negative to date  -AFB sputum expectorated specimen -sputum cytology  -reviewed pertinent imaging with patient today - ESR/CRP/RF- elevated  -PT/OT for d/c planning  -please encourage patient to use incentive spirometer few times each hour while hospitalized.       Esophageal perforation with PEG status -Patient is status post GI and ENT evaluation, states that he has been cleared to have p.o. fluids and ulcer is well-healing after most recent antimicrobials.  He stopped using his PEG tube and was eating normally p.o. this point.  His most recent swallow evaluation was abnormal he was recommended to have no oral nourishment.   Rheumatoid arthritis with acute exacerbation of interstitial lung disease -This appears to have occurred after cessation of Humira.  Patient has finally been able to wean down on prednisone therapy down to 10 mg for the first time in over a year however after he was recommended to DC Humira he reports progressive decline over period of 1 to 2 weeks with increased O2 requirement and resultant hospitalization.  I have restarted Solu-Medrol at this time.  Patient is able to speak in full sentences and is not having labored breathing and is not in distress during my evaluation. -Agree with continuing Plaquenil -Patient recently had ophthalmology evaluation and it was recommended for him to continue this medication. -Continue solumedrol 73m until 02/09/22 then may dc home with pred 5342mto be tapered by 42m44mvery day.  Thank you for allowing me to participate in the care of this patient.   Patient/Family are satisfied with care  plan and all questions have been answered.    Provider disclosure: Patient with at least one acute or chronic illness or injury that poses a threat to life or bodily function and is being managed actively during this encounter.  All of the below services have been performed independently by signing provider:  review of prior documentation from internal and or external health records.  Review of previous and current lab results.  Interview and comprehensive assessment during patient visit today. Review of current and previous chest radiographs/CT scans. Discussion of management and test interpretation with health care team and patient/family.   This document was prepared using Dragon voice recognition software and may include unintentional dictation errors.     FuaOttie Glazier.D.  Division of Pulmonary & Critical Care Medicine

## 2022-02-12 DIAGNOSIS — J189 Pneumonia, unspecified organism: Secondary | ICD-10-CM | POA: Diagnosis not present

## 2022-02-12 LAB — CULTURE, BLOOD (ROUTINE X 2)
Culture: NO GROWTH
Culture: NO GROWTH
Special Requests: ADEQUATE
Special Requests: ADEQUATE

## 2022-02-12 LAB — BASIC METABOLIC PANEL
Anion gap: 3 — ABNORMAL LOW (ref 5–15)
BUN: 19 mg/dL (ref 6–20)
CO2: 26 mmol/L (ref 22–32)
Calcium: 8.8 mg/dL — ABNORMAL LOW (ref 8.9–10.3)
Chloride: 104 mmol/L (ref 98–111)
Creatinine, Ser: 0.74 mg/dL (ref 0.61–1.24)
GFR, Estimated: >60 mL/min (ref >60)
Glucose, Bld: 94 mg/dL (ref 70–99)
Potassium: 3.9 mmol/L (ref 3.5–5.1)
Sodium: 133 mmol/L — ABNORMAL LOW (ref 135–145)

## 2022-02-12 LAB — CBC
HCT: 39 % (ref 39.0–52.0)
Hemoglobin: 13.2 g/dL (ref 13.0–17.0)
MCH: 30.6 pg (ref 26.0–34.0)
MCHC: 33.8 g/dL (ref 30.0–36.0)
MCV: 90.3 fL (ref 80.0–100.0)
Platelets: 259 10*3/uL (ref 150–400)
RBC: 4.32 MIL/uL (ref 4.22–5.81)
RDW: 12.6 % (ref 11.5–15.5)
WBC: 4.2 10*3/uL (ref 4.0–10.5)
nRBC: 0 % (ref 0.0–0.2)

## 2022-02-12 LAB — RHEUMATOID FACTOR: Rheumatoid fact SerPl-aCnc: 256.1 IU/mL — ABNORMAL HIGH (ref <14.0)

## 2022-02-12 MED ORDER — FUROSEMIDE 20 MG PO TABS: 20.0000 mg | ORAL_TABLET | Freq: Every day | ORAL | 0 refills | Status: DC

## 2022-02-12 MED ORDER — PREDNISONE 10 MG PO TABS: 10.0000 mg | ORAL_TABLET | Freq: Every day | ORAL | Status: DC

## 2022-02-12 MED ORDER — PREDNISONE 10 MG PO TABS: 15.0000 mg | ORAL_TABLET | Freq: Every day | ORAL | Status: DC

## 2022-02-12 MED ORDER — PREDNISONE 20 MG PO TABS: 25.0000 mg | ORAL_TABLET | Freq: Every day | ORAL | Status: DC

## 2022-02-12 MED ORDER — AMOXICILLIN-POT CLAVULANATE 875-125 MG PO TABS: 1.0000 | ORAL_TABLET | Freq: Two times a day (BID) | ORAL | 0 refills | Status: AC

## 2022-02-12 MED ORDER — PREDNISONE 20 MG PO TABS: 20.0000 mg | ORAL_TABLET | Freq: Every day | ORAL | Status: DC

## 2022-02-12 MED ORDER — PREDNISONE 5 MG PO TABS: ORAL_TABLET | ORAL | 0 refills | Status: AC

## 2022-02-12 MED ORDER — MYCOPHENOLATE MOFETIL 500 MG PO TABS: 1500.0000 mg | ORAL_TABLET | Freq: Two times a day (BID) | ORAL | 0 refills | Status: AC

## 2022-02-12 MED ORDER — PREDNISONE 20 MG PO TABS: 30.0000 mg | ORAL_TABLET | Freq: Every day | ORAL | Status: DC

## 2022-02-12 NOTE — Progress Notes (Signed)
Discharge instructions were reviewed with family and pt. IV was taken out. Questions were answered and encourage. VSS. Belongings were collected by pt.

## 2022-02-12 NOTE — Plan of Care (Signed)

## 2022-02-12 NOTE — Discharge Summary (Signed)
Physician Discharge Summary  MAURILIO PURYEAR YSA:[id number] DOB: 09-25-1975 DOA: 02/06/2022  PCP: Casilda Carls, MD  Admit date: 02/06/2022 Discharge date: 02/12/2022  Admitted From: Home Disposition:  Home  Recommendations for Outpatient Follow-up:  Follow up with PCP in 1-2 weeks Follow-up with pulmonology as directed Follow-up with rheumatology as directed  Home Health: No Equipment/Devices: Oxygen 3 L via nasal cannula  Discharge Condition: Stable CODE STATUS: Full Diet recommendation: Carb modified diet/tube feeds  Brief/Interim Summary:  46 y.o. 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  Readmitted for acute hypoxic respiratory failure likely due to pneumonia   10/29: Pulmonary consult, currently on 4 L oxygen 10/30: Weaned off to room air 10/31: Oxygen requirement increased to 4.5 L as he was hypoxic in mid 80s on 3 L earlier.  Started Lasix 11/1: Weaned to 4 L 11/2: Weaned to baseline 3L.  Dizzy when ambulating.  D/w pulm, recommend PT eval and overnight monitoringq 11/3: Weaned to baseline 3 L nasal cannula.  Ambulated around unit with physical therapy and nursing staff.  Did well overall.  Slight dizziness prior to quickly recovered.  Stable for DC home.  No PT follow-up recommended.  Patient will see primary care, pulmonology, rheumatology and follow-up.    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  Was weaned off to room air however subsequently became hypoxic again.  Weaned to baseline 3L on 11/2.  Some dizziness when standing.  Evaluated by therapy.  On day of discharge patient is stable.  No distress noted.  No follow-up recommended.   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  CellCept increased dose to 1500 mg twice daily.  Plaquenil at previous dose.  Prednisone taper prescribed.  Follow-up with outpatient pulmonology and rheumatology.     ILD (interstitial lung disease) ([place]) Flare could be due to cessation of Humira.  Continue Plaquenil.  Continue CellCept.  Responded well to steroid.  Continue prednisone with daily taper   History of esophageal perforation Eating orally.  Patient continues to ignore ST recommendation.  Per speech therapy   Pt has a complicated  history and has been recommended to be NPO per the last 2 MBSSs AND a  recent visit to [place] (w/ MBSS too per pt) rec'd continued use of PEG d/t the perforation NOT healing at this time -- "communication between the proximal pharyngo-Esophageal wall (posterior wall) resulting in contrast moving into the retropharyngeal space.", per ENT/MDs/chart notes.   Discharge Instructions  Discharge Instructions     Diet - low sodium heart healthy   Complete by: As directed    Increase activity slowly   Complete by: As directed       Allergies as of 02/12/2022   No Known Allergies      Medication List     STOP taking these medications    fluconazole 100 MG tablet Commonly known as: DIFLUCAN   folic acid 1 MG tablet Commonly known as: FOLVITE   Humira Pen 40 MG/0.4ML Pnkt Generic drug: Adalimumab   polyethylene glycol 17 g packet Commonly known as: MIRALAX / GLYCOLAX       TAKE these medications    albuterol 108 (90 Base) MCG/ACT inhaler Commonly known as: VENTOLIN HFA Inhale 2 puffs into the lungs every 6 (six) hours as needed for wheezing or shortness of breath.   amoxicillin-clavulanate 875-125 MG tablet Commonly known as: AUGMENTIN Take 1 tablet by mouth every 12 (twelve) hours for 3 days. Start taking on: February 13, 2022   blood glucose meter kit and supplies Dispense based on patient and insurance preference. Use up to four times daily as directed. (FOR ICD-10 E10.9, E11.9).   blood glucose meter kit and supplies Kit Dispense based on patient and insurance preference. Use up to four times daily as directed.   Breztri Aerosphere 160-9-4.8 MCG/ACT Aero Generic drug: Budeson-Glycopyrrol-Formoterol Inhale 2 puffs into the lungs 2 (two) times daily.   chlorhexidine 0.12 % solution Commonly known as: PERIDEX 15 mLs 2 (two) times daily.   clobetasol ointment 0.05 % Commonly known as: TEMOVATE Apply topically.   Contour Next One Kit 4 (four) times daily.   furosemide 20 MG tablet Commonly known as: Lasix Take 1 tablet (20 mg  total) by mouth daily.   hydroxychloroquine 200 MG tablet Commonly known as: PLAQUENIL Place 1 tablet (200 mg total) into feeding tube 2 (two) times daily.   insulin aspart 100 UNIT/ML FlexPen Commonly known as: NOVOLOG Inject 1-9 Units into the skin every 6 (six) hours. CBG 70 - 120: 0 units CBG 121 - 150: 1 unit CBG 151 - 200: 2 units CBG 201 - 250: 3 units CBG 251 - 300: 5 units CBG 301 - 350: 7 units CBG 351 - 400: 9 units CBG > 400: call MD and obtain STAT lab verification   lidocaine 2 % solution Commonly known as: XYLOCAINE Use as directed 15 mLs in the mouth or throat every 6 (six) hours as needed (mouth/throat pain).   mycophenolate 500 MG tablet Commonly known as: CellCept Take 3 tablets (1,500 mg total) by mouth 2 (two) times daily. What changed:  how much to take how to take this when to take this   NONFORMULARY OR COMPOUNDED ITEM LIDOCAINE/MAALOX XS 1:1   pantoprazole 40 MG tablet Commonly known as: Protonix Take 1 tablet (40 mg total) by mouth daily.   Pen Needles 3/16" 31G X 5 MM Misc 1 pen. by Does not apply route every 6 (six) hours.   predniSONE 5 MG tablet Commonly known as: DELTASONE Take 6 tablets (30 mg total) by mouth daily with breakfast for 1 day,  THEN 5 tablets (25 mg total) daily with breakfast for 1 day, THEN 4 tablets (20 mg total) daily with breakfast for 1 day, THEN 3 tablets (15 mg total) daily with breakfast for 1 day, THEN 2 tablets (10 mg total) daily with breakfast for 1 day, THEN 1 tablet (5 mg total) daily with breakfast for 20 days. Start taking on: February 13, 2022 What changed: See the new instructions.   sulfamethoxazole-trimethoprim 400-80 MG tablet Commonly known as: BACTRIM Place 1 tablet into feeding tube 3 (three) times a week.   tacrolimus 0.1 % ointment Commonly known as: PROTOPIC Apply topically 2 (two) times daily.   triamcinolone cream 0.1 % Commonly known as: KENALOG Apply topically 2 (two) times daily.   Vitamin D  12.5 MCG/0.25ML Liqd Give 1 mL by tube daily.        Follow-up Information     Casilda Carls, MD. Schedule an appointment as soon as possible for a visit in 1 week(s).   Specialty: Internal Medicine Contact information: [address] [place] [address] [phone]         Ottie Glazier, MD Follow up.   Specialty: Pulmonary Disease Why: Dr. Loni Muse will set up followup appointment Contact information: [place] [address] [phone]                No Known Allergies  Consultations: Pulmonology   Procedures/Studies: DG Chest Port 1 View  Result Date: 02/11/2022 CLINICAL DATA:  Follow-up pleural effusion EXAM: PORTABLE CHEST 1 VIEW COMPARISON:  02/09/2022 FINDINGS: Cardiac shadow is stable. Patchy airspace opacities are noted in the bases overall stable in appearance from the prior exam given some technical variations in the film. No new focal infiltrate is seen. Small effusions are again noted. Bony structures are within normal limits. IMPRESSION: No change from the previous exam. Electronically Signed   By: Inez Catalina M.D.   On: 02/11/2022 10:21   DG Chest Port 1 View  Result Date: 02/09/2022 CLINICAL DATA:  Pleural effusion EXAM: PORTABLE CHEST 1 VIEW COMPARISON:  Chest x-ray dated February 06, 2022 FINDINGS: Visualized cardiac and mediastinal contours are unchanged. Bilateral patchy lower lung predominance consolidations unchanged when compared with prior exam. Unchanged small bilateral pleural effusions. No evidence of pneumothorax. IMPRESSION: Unchanged bilateral patchy lower lung predominance consolidations and small bilateral pleural effusions. Electronically Signed   By: Yetta Glassman M.D.   On: 02/09/2022 14:44   CT Chest W Contrast  Result Date: 02/06/2022 CLINICAL DATA:  Esophageal perforation, progressive hypoxia, cough, chest pain, dyspnea. EXAM: CT CHEST WITH CONTRAST TECHNIQUE: Multidetector CT imaging of the  chest was performed during intravenous contrast administration. RADIATION DOSE REDUCTION: This exam was performed according to the departmental dose-optimization program which includes automated exposure control, adjustment of the mA and/or kV according to patient size and/or use of iterative reconstruction technique. CONTRAST:  62m OMNIPAQUE IOHEXOL 300 MG/ML  SOLN COMPARISON:  01/29/2022, 09/01/2021 FINDINGS: Cardiovascular: No significant coronary artery calcification. Global cardiac size within normal limits. No pericardial effusion. Central pulmonary arteries are enlarged, similar to prior examination, in keeping with changes of pulmonary arterial hypertension. The thoracic aorta is unremarkable. Mediastinum/Nodes: Pathologic thoracic adenopathy predominantly within the aorta pulmonary window appears stable since prior examination with the index lymph nodes measuring 12-15 mm in short axis diameter. Visualized thyroid is unremarkable. The esophagus is unremarkable. No pneumomediastinum. Lungs/Pleura: Mild paraseptal emphysema. Bibasilar pulmonary consolidative infiltrates with associated volume loss appears stable since prior examination. Numerous superimposed cysts  are identified, similar to prior examination, as well as scattered inflammatory appearing centrilobular nodules and, together, the findings raise the question of an underlying interstitial lung disease such as lymphocytic interstitial pneumonia. Necrotizing infection, however, could appear similarly, as can be seen with recurrent aspiration. No pneumothorax. Small bilateral pleural effusions are again identified. No central obstructing lesion. Upper Abdomen: No acute abnormality. Musculoskeletal: No chest wall abnormality. No acute or significant osseous findings. IMPRESSION: 1. Stable bibasilar pulmonary consolidative infiltrates with associated volume loss. Numerous superimposed cysts are similar to prior examination, as well as scattered  inflammatory appearing centrilobular nodules and, together, the findings raise the question of an underlying interstitial lung disease such as lymphocytic interstitial pneumonia. Necrotizing infection, however, could appear similarly, as can be seen with recurrent aspiration. 2. Stable small bilateral pleural effusions. 3. Mild paraseptal emphysema. 4. Stable mediastinal adenopathy, possibly reactive or inflammatory. 5. Stable enlargement of the central pulmonary arteries in keeping with changes of pulmonary arterial hypertension. Emphysema (ICD10-J43.9). Electronically Signed   By: Fidela Salisbury M.D.   On: 02/06/2022 21:58   DG Chest Portable 1 View  Result Date: 02/06/2022 CLINICAL DATA:  Shortness of breath EXAM: PORTABLE CHEST 1 VIEW COMPARISON:  01/31/2022 FINDINGS: Bilateral mid and lower lobe airspace opacities with small bilateral effusions, similar prior study. Heart is normal size. Mediastinal contours within normal limits. No acute bony abnormality. IMPRESSION: Stable bilateral lower lobe infiltrates with small effusions. Electronically Signed   By: Rolm Baptise M.D.   On: 02/06/2022 20:25   DG Chest Port 1 View  Result Date: 01/31/2022 CLINICAL DATA:  Community-acquired pneumonia EXAM: PORTABLE CHEST 1 VIEW COMPARISON:  Two days ago FINDINGS: Unchanged bilateral pneumonia with small pleural effusions. No pulmonary edema. No pneumothorax. Normal heart size. IMPRESSION: Unchanged bilateral pulmonary infiltrates. Electronically Signed   By: Jorje Guild M.D.   On: 01/31/2022 05:35   CT Angio Chest PE W/Cm &/Or Wo Cm  Result Date: 01/29/2022 CLINICAL DATA:  Pulmonary embolism (PE) suspected, unknown D-dimer pleuritic cp, h/o perferated esophageal ulcer and pneumomediastinum. EXAM: CT ANGIOGRAPHY CHEST WITH CONTRAST TECHNIQUE: Multidetector CT imaging of the chest was performed using the standard protocol during bolus administration of intravenous contrast. Multiplanar CT image  reconstructions and MIPs were obtained to evaluate the vascular anatomy. RADIATION DOSE REDUCTION: This exam was performed according to the departmental dose-optimization program which includes automated exposure control, adjustment of the mA and/or kV according to patient size and/or use of iterative reconstruction technique. CONTRAST:  175m OMNIPAQUE IOHEXOL 350 MG/ML SOLN COMPARISON:  Radiograph earlier today. Prior CTs most recently 09/01/2021 FINDINGS: Cardiovascular: There are no filling defects within the pulmonary arteries to suggest pulmonary embolus. Prominence of the left main pulmonary artery at 2.6 cm, prominence of the right main pulmonary artery 2.5 cm. Heart size upper normal. No aortic dissection or aneurysm. Common origin of the brachiocephalic and left common carotid artery, variant arch anatomy. Mediastinum/Nodes: No pneumomediastinum. Mild multifocal mediastinal and hilar adenopathy, lymph nodes measuring up to 13 mm short axis. There is no paraesophageal fluid collection or definite esophageal wall thickening. Lungs/Pleura: Mild emphysema. Bibasilar volume loss. Rather extensive airspace opacities in both lower lobes with air bronchograms, chronic but slightly progressed from May. There additional multifocal patchy airspace disease that is confluent, ground-glass and ill-defined, also progressed. There are small bilateral pleural effusions. No pneumothorax. Upper Abdomen: No acute upper abdominal findings. No free air in the included abdomen. Musculoskeletal: There are no acute or suspicious osseous abnormalities. Review of the MIP images  confirms the above findings. IMPRESSION: 1. No pulmonary embolus. 2. Rather extensive bilateral lower lobe airspace opacities with air bronchograms, chronic but slightly progressed from May. There are additional multifocal patchy airspace disease that is confluent, ground-glass and ill-defined, also progressed. Findings may represent infection superimposed on  chronic lung disease. Patient has history of rheumatoid arthritis raising the possibility of rheumatoid lung disease. 3. No pneumomediastinum or findings of esophageal perforation. 4. Small bilateral pleural effusions. 5. Mild multifocal mediastinal and hilar adenopathy is likely reactive. 6. Mild prominence of both main pulmonary arteries suggesting pulmonary arterial hypertension. Aortic Atherosclerosis (ICD10-I70.0) and Emphysema (ICD10-J43.9). Electronically Signed   By: Keith Rake M.D.   On: 01/29/2022 21:35   DG Chest 2 View  Result Date: 01/29/2022 CLINICAL DATA:  Chest pain EXAM: CHEST - 2 VIEW COMPARISON:  09/03/2021 chest x-ray and CT, chest x-ray 09/11/2021 FINDINGS: Suspected small pleural effusions. Heterogeneous airspace disease at both bases with progression since 09/11/2021. Air collection at the left CP angle similar compared to the previous exams. Stable cardiomediastinal silhouette. No pneumothorax IMPRESSION: Probable small pleural effusions. Heterogeneous airspace disease at the bases appears slightly progressed compared to prior and suggests acute infectious or inflammatory component superimposed on likely chronic lung disease. Electronically Signed   By: Donavan Foil M.D.   On: 01/29/2022 17:42      Subjective: Seen and examined on day of discharge.  Stable no distress.  Ambulating around nursing unit without desaturation.  Stable for DC home.  Discharge Exam: Vitals:   02/12/22 0440 02/12/22 1527  BP: 102/70 114/71  Pulse: 88 98  Resp: 20   Temp: 98.1 F (36.7 C) 97.6 F (36.4 C)  SpO2: 94% 98%   Vitals:   02/11/22 1539 02/11/22 2012 02/12/22 0440 02/12/22 1527  BP: 109/74 119/79 102/70 114/71  Pulse: 92 96 88 98  Resp: _0 Temp: 97.9 F (36.6 C) 98.2 F (36.8 C) 98.1 F (36.7 C) 97.6 F (36.4 C)  TempSrc: Oral Oral Oral Oral  SpO2: 98% 99% 94% 98%  Weight:      Height:        General: Pt is alert, awake, not in acute  distress Cardiovascular: RRR, S1/S2 +, no rubs, no gallops Respiratory: CTA bilaterally, no wheezing, no rhonchi Abdominal: Soft, NT, ND, bowel sounds + Extremities: no edema, no cyanosis    The results of significant diagnostics from this hospitalization (including imaging, microbiology, ancillary and laboratory) are listed below for reference.     Microbiology: Recent Results (from the past 240 hour(s))  Resp Panel by RT-PCR (Flu A&B, Covid) Anterior Nasal Swab     Status: None   Collection Time: 02/06/22  7:57 PM   Specimen: Anterior Nasal Swab  Result Value Ref Range Status   SARS Coronavirus 2 by RT PCR NEGATIVE NEGATIVE Final    Comment: (NOTE) SARS-CoV-2 target nucleic acids are NOT DETECTED.  The SARS-CoV-2 RNA is generally detectable in upper respiratory specimens during the acute phase of infection. The lowest concentration of SARS-CoV-2 viral copies this assay can detect is 138 copies/mL. A negative result does not preclude SARS-Cov-2 infection and should not be used as the sole basis for treatment or other patient management decisions. A negative result may occur with  improper specimen collection/handling, submission of specimen other than nasopharyngeal swab, presence of viral mutation(s) within the areas targeted by this assay, and inadequate number of viral copies(<138 copies/mL). A negative result must be combined with clinical observations, patient history, and  epidemiological information. The expected result is Negative.  Fact Sheet for Patients:  EntrepreneurPulse.com.au  Fact Sheet for Healthcare Providers:  IncredibleEmployment.be  This test is no t yet approved or cleared by the [place] FDA and  has been authorized for detection and/or diagnosis of SARS-CoV-2 by FDA under an Emergency Use Authorization (EUA). This EUA will remain  in effect (meaning this test can be used) for the duration of the COVID-19  declaration under Section 564(b)(1) of the Act, 21 U.S.C.section 360bbb-3(b)(1), unless the authorization is terminated  or revoked sooner.       Influenza A by PCR NEGATIVE NEGATIVE Final   Influenza B by PCR NEGATIVE NEGATIVE Final    Comment: (NOTE) The Xpert Xpress SARS-CoV-2/FLU/RSV plus assay is intended as an aid in the diagnosis of influenza from Nasopharyngeal swab specimens and should not be used as a sole basis for treatment. Nasal washings and aspirates are unacceptable for Xpert Xpress SARS-CoV-2/FLU/RSV testing.  Fact Sheet for Patients: EntrepreneurPulse.com.au  Fact Sheet for Healthcare Providers: IncredibleEmployment.be  This test is not yet approved or cleared by the [place] FDA and has been authorized for detection and/or diagnosis of SARS-CoV-2 by FDA under an Emergency Use Authorization (EUA). This EUA will remain in effect (meaning this test can be used) for the duration of the COVID-19 declaration under Section 564(b)(1) of the Act, 21 U.S.C. section 360bbb-3(b)(1), unless the authorization is terminated or revoked.  Performed at [facility], [address] [place], [place] [address]   SARS Coronavirus 2 by RT PCR (hospital order, performed in [facility] hospital lab) *cepheid single result test* Anterior Nasal Swab     Status: None   Collection Time: 02/06/22 11:32 PM   Specimen: Anterior Nasal Swab  Result Value Ref Range Status   SARS Coronavirus 2 by RT PCR NEGATIVE NEGATIVE Final    Comment: (NOTE) SARS-CoV-2 target nucleic acids are NOT DETECTED.  The SARS-CoV-2 RNA is generally detectable in upper and lower respiratory specimens during the acute phase of infection. The lowest concentration of SARS-CoV-2 viral copies this assay can detect is 250 copies / mL. A negative result does not preclude SARS-CoV-2 infection and should not be used as the sole basis for treatment or other patient  management decisions.  A negative result may occur with improper specimen collection / handling, submission of specimen other than nasopharyngeal swab, presence of viral mutation(s) within the areas targeted by this assay, and inadequate number of viral copies (<250 copies / mL). A negative result must be combined with clinical observations, patient history, and epidemiological information.  Fact Sheet for Patients:   https://www.patel.info/  Fact Sheet for Healthcare Providers: https://hall.com/  This test is not yet approved or  cleared by the [place] FDA and has been authorized for detection and/or diagnosis of SARS-CoV-2 by FDA under an Emergency Use Authorization (EUA).  This EUA will remain in effect (meaning this test can be used) for the duration of the COVID-19 declaration under Section 564(b)(1) of the Act, 21 U.S.C. section 360bbb-3(b)(1), unless the authorization is terminated or revoked sooner.  Performed at [facility], [address] [place], [place] [address]   Culture, blood (Routine X 2) w Reflex to ID Panel     Status: None   Collection Time: 02/07/22  4:56 AM   Specimen: BLOOD LEFT ARM  Result Value Ref Range Status   Specimen Description BLOOD LEFT ARM  Final   Special Requests   Final    BOTTLES  DRAWN AEROBIC AND ANAEROBIC Blood Culture adequate volume   Culture   Final    NO GROWTH 5 DAYS Performed at [facility], [address] [place], [place] [address]    Report Status 02/12/2022 FINAL  Final  Culture, blood (Routine X 2) w Reflex to ID Panel     Status: None   Collection Time: 02/07/22  4:56 AM   Specimen: BLOOD RIGHT ARM  Result Value Ref Range Status   Specimen Description BLOOD RIGHT ARM  Final   Special Requests   Final    BOTTLES DRAWN AEROBIC AND ANAEROBIC Blood Culture adequate volume   Culture   Final    NO GROWTH 5 DAYS Performed at [facility], [address] [place], [place] [address]    Report Status 02/12/2022 FINAL  Final  MRSA Next Gen by PCR, Nasal     Status: None   Collection Time: 02/07/22  5:39 PM   Specimen: Nasal Mucosa; Nasal Swab  Result Value Ref Range Status   MRSA by PCR Next Gen NOT DETECTED NOT DETECTED Final    Comment: (NOTE) The GeneXpert MRSA Assay (FDA approved for NASAL specimens only), is one component of a comprehensive MRSA colonization surveillance program. It is not intended to diagnose MRSA infection nor to guide or monitor treatment for MRSA infections. Test performance is not FDA approved in patients less than 86 years old. Performed at [facility], [address] [place], [place] [address]      Labs: BNP (last 3 results) Recent Labs    09/01/21 1227 02/06/22 2001  BNP 22.9 16.0   Basic Metabolic Panel: Recent Labs  Lab 02/08/22 0552 02/09/22 0519 02/10/22 0649 02/11/22 0607 02/12/22 0415  NA 138 139 135 136 133*  K 4.7 3.8 4.1 3.8 3.9  CL 109 111 105 106 104  CO2 22 21* _0 GLUCOSE 100* 75 76 105* 94  BUN _1 CREATININE 0.89 0.81 0.92 0.88 0.74  CALCIUM 8.8* 8.8* 9.0 9.1 8.8*  MG 1.8  --   --   --   --   PHOS 4.4  --   --   --   --    Liver Function Tests: Recent Labs  Lab 02/06/22 1954  AST 46*  ALT 43  ALKPHOS 46  BILITOT 0.5  PROT 8.6*  ALBUMIN 3.3*   No results for input(s): "LIPASE", "AMYLASE" in the last 168 hours. No results for input(s): "AMMONIA" in the last 168 hours. CBC: Recent Labs  Lab 02/06/22 1954 02/07/22 0455 02/08/22 0552 02/09/22 0519 02/10/22 0649 02/11/22 0607 02/12/22 0415  WBC 4.7   < > 4.0 5.7 4.5 4.2 4.2  NEUTROABS 3.3  --   --   --   --   --   --   HGB 14.6   < > 12.8* 13.4 13.9 13.5 13.2  HCT 44.6   < > 39.1 40.0 42.1 40.4 39.0  MCV 94.9   < > 92.0 92.2 93.1 92.0 90.3  PLT 259   < > 245 246 243 238 259   < > = values in this interval not displayed.   Cardiac Enzymes: No results for input(s):  "CKTOTAL", "CKMB", "CKMBINDEX", "TROPONINI" in the last 168 hours. BNP: Invalid input(s): "POCBNP" CBG: No results for input(s): "GLUCAP" in the last 168 hours. D-Dimer No results for input(s): "DDIMER" in the last 72 hours. Hgb A1c No results for input(s): "HGBA1C" in the last  72 hours. Lipid Profile No results for input(s): "CHOL", "HDL", "LDLCALC", "TRIG", "CHOLHDL", "LDLDIRECT" in the last 72 hours. Thyroid function studies No results for input(s): "TSH", "T4TOTAL", "T3FREE", "THYROIDAB" in the last 72 hours.  Invalid input(s): "FREET3" Anemia work up No results for input(s): "VITAMINB12", "FOLATE", "FERRITIN", "TIBC", "IRON", "RETICCTPCT" in the last 72 hours. Urinalysis No results found for: "COLORURINE", "APPEARANCEUR", "LABSPEC", "PHURINE", "GLUCOSEU", "HGBUR", "BILIRUBINUR", "KETONESUR", "PROTEINUR", "UROBILINOGEN", "NITRITE", "LEUKOCYTESUR" Sepsis Labs Recent Labs  Lab 02/09/22 0519 02/10/22 0649 02/11/22 0607 02/12/22 0415  WBC 5.7 4.5 4.2 4.2   Microbiology Recent Results (from the past 240 hour(s))  Resp Panel by RT-PCR (Flu A&B, Covid) Anterior Nasal Swab     Status: None   Collection Time: 02/06/22  7:57 PM   Specimen: Anterior Nasal Swab  Result Value Ref Range Status   SARS Coronavirus 2 by RT PCR NEGATIVE NEGATIVE Final    Comment: (NOTE) SARS-CoV-2 target nucleic acids are NOT DETECTED.  The SARS-CoV-2 RNA is generally detectable in upper respiratory specimens during the acute phase of infection. The lowest concentration of SARS-CoV-2 viral copies this assay can detect is 138 copies/mL. A negative result does not preclude SARS-Cov-2 infection and should not be used as the sole basis for treatment or other patient management decisions. A negative result may occur with  improper specimen collection/handling, submission of specimen other than nasopharyngeal swab, presence of viral mutation(s) within the areas targeted by this assay, and inadequate number  of viral copies(<138 copies/mL). A negative result must be combined with clinical observations, patient history, and epidemiological information. The expected result is Negative.  Fact Sheet for Patients:  EntrepreneurPulse.com.au  Fact Sheet for Healthcare Providers:  IncredibleEmployment.be  This test is no t yet approved or cleared by the [place] FDA and  has been authorized for detection and/or diagnosis of SARS-CoV-2 by FDA under an Emergency Use Authorization (EUA). This EUA will remain  in effect (meaning this test can be used) for the duration of the COVID-19 declaration under Section 564(b)(1) of the Act, 21 U.S.C.section 360bbb-3(b)(1), unless the authorization is terminated  or revoked sooner.       Influenza A by PCR NEGATIVE NEGATIVE Final   Influenza B by PCR NEGATIVE NEGATIVE Final    Comment: (NOTE) The Xpert Xpress SARS-CoV-2/FLU/RSV plus assay is intended as an aid in the diagnosis of influenza from Nasopharyngeal swab specimens and should not be used as a sole basis for treatment. Nasal washings and aspirates are unacceptable for Xpert Xpress SARS-CoV-2/FLU/RSV testing.  Fact Sheet for Patients: EntrepreneurPulse.com.au  Fact Sheet for Healthcare Providers: IncredibleEmployment.be  This test is not yet approved or cleared by the [place] FDA and has been authorized for detection and/or diagnosis of SARS-CoV-2 by FDA under an Emergency Use Authorization (EUA). This EUA will remain in effect (meaning this test can be used) for the duration of the COVID-19 declaration under Section 564(b)(1) of the Act, 21 U.S.C. section 360bbb-3(b)(1), unless the authorization is terminated or revoked.  Performed at [facility], [address] [place], [place] [address]   SARS Coronavirus 2 by RT PCR (hospital order, performed in [facility] hospital lab) *cepheid single result  test* Anterior Nasal Swab     Status: None   Collection Time: 02/06/22 11:32 PM   Specimen: Anterior Nasal Swab  Result Value Ref Range Status   SARS Coronavirus 2 by RT PCR NEGATIVE NEGATIVE Final    Comment: (NOTE) SARS-CoV-2 target nucleic acids are NOT DETECTED.  The SARS-CoV-2 RNA  is generally detectable in upper and lower respiratory specimens during the acute phase of infection. The lowest concentration of SARS-CoV-2 viral copies this assay can detect is 250 copies / mL. A negative result does not preclude SARS-CoV-2 infection and should not be used as the sole basis for treatment or other patient management decisions.  A negative result may occur with improper specimen collection / handling, submission of specimen other than nasopharyngeal swab, presence of viral mutation(s) within the areas targeted by this assay, and inadequate number of viral copies (<250 copies / mL). A negative result must be combined with clinical observations, patient history, and epidemiological information.  Fact Sheet for Patients:   https://www.patel.info/  Fact Sheet for Healthcare Providers: https://hall.com/  This test is not yet approved or  cleared by the [place] FDA and has been authorized for detection and/or diagnosis of SARS-CoV-2 by FDA under an Emergency Use Authorization (EUA).  This EUA will remain in effect (meaning this test can be used) for the duration of the COVID-19 declaration under Section 564(b)(1) of the Act, 21 U.S.C. section 360bbb-3(b)(1), unless the authorization is terminated or revoked sooner.  Performed at [facility], [address] [place], [place] [address]   Culture, blood (Routine X 2) w Reflex to ID Panel     Status: None   Collection Time: 02/07/22  4:56 AM   Specimen: BLOOD LEFT ARM  Result Value Ref Range Status   Specimen Description BLOOD LEFT ARM  Final   Special Requests   Final     BOTTLES DRAWN AEROBIC AND ANAEROBIC Blood Culture adequate volume   Culture   Final    NO GROWTH 5 DAYS Performed at [facility], [address], [place], [place] [address]    Report Status 02/12/2022 FINAL  Final  Culture, blood (Routine X 2) w Reflex to ID Panel     Status: None   Collection Time: 02/07/22  4:56 AM   Specimen: BLOOD RIGHT ARM  Result Value Ref Range Status   Specimen Description BLOOD RIGHT ARM  Final   Special Requests   Final    BOTTLES DRAWN AEROBIC AND ANAEROBIC Blood Culture adequate volume   Culture   Final    NO GROWTH 5 DAYS Performed at [facility], [address], [place], [place] [address]    Report Status 02/12/2022 FINAL  Final  MRSA Next Gen by PCR, Nasal     Status: None   Collection Time: 02/07/22  5:39 PM   Specimen: Nasal Mucosa; Nasal Swab  Result Value Ref Range Status   MRSA by PCR Next Gen NOT DETECTED NOT DETECTED Final    Comment: (NOTE) The GeneXpert MRSA Assay (FDA approved for NASAL specimens only), is one component of a comprehensive MRSA colonization surveillance program. It is not intended to diagnose MRSA infection nor to guide or monitor treatment for MRSA infections. Test performance is not FDA approved in patients less than 63 years old. Performed at [facility], [address] [place], [place] [address]      Time coordinating discharge: Over 30 minutes  SIGNED:   Sidney Ace, MD  [facility] 02/12/2022, 4:42 PM Pager   If 7PM-7AM, please contact night-coverage

## 2022-02-12 NOTE — Progress Notes (Signed)
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  Does have history of smoking with paraseptal emphysema with concomitant areas of likely interstitial lung disease and fibrotic changes worse at the bases bilaterally.  Additionally patient has been on multiple immunosuppressive agents including Humira, Plaquenil, steroids chronically, CellCept which do predispose him for infection.  He has been very difficult to wean from steroids and generally starts to flare underlying interstitial lung disease with worsening hypoxemia when steroids are tapered to less than 40 mg of prednisone.  He does respond fairly quickly to steroids and generally has improvement in oxygenation post glucocorticoid therapy.  Is also had recurrent effusions which have been drained via thoracentesis and have not been shown to be infected or parapneumonic/empyema.  Patient reports over past week he slowly but steadily continued to get weak and more dyspneic.  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 He has been eating orally and has not been eating via PEG tube. He denies feeling flu like illness. He has been urinating well denies dysuria or dark urine.  Having normal well formed with intermittent loose stools.   Patient was asked to stop his Humira after most recent rheumatology evaluation with [name] and reports clinical decline after skipping his last injection.  On  admission in the ED he did not have additional CT chest with findings of consolidative infiltrates with associated volume loss as well as bilateral effusions worse on the right side, mild paraseptal emphysema, mediastinal adenopathy likely reactive or inflammatory, and enlargement of central pulmonary arteries in keeping with possible pulmonary hypertension.  Pulmonary consultation placed for additional evaluation management of complex relatively immunosuppressive patient with interstitial lung disease and systemic autoimmune disease.  Blood work does not show leukocytosis and stable anemia, he does share he has had loose stools over the last week, we did discuss possible infection and a stool PCR panel has been ordered.  Basic metabolic panel is essentially unremarkable with mild leukocytosis, normal renal function with GFR over 60.  Empirically patient is on vancomycin and cefepime, we discussed needing to possibly de-escalate as soon as infectious work-up comes back negative as previous.  02/08/22- patient is improved, reports less chest discomfort and dyspnea this am. He is weaned to 3L/min [place].  Procalcitonin is flat <0.10 renal function is normal and CBC with absence of leukocytosis.   02/09/22- patient had mild worsening with hypoxemia and increased O2 req.  He is eating PO as well and may have overlying aspiration. He has been diuresed and chest x ray shows a moderate effusion.   02/10/22- patient is slightly improved today.  We discussed using PEG but patient shares he has diarreah if he uses osmolyte. I have restarted his cellcept at 1g bid, plan to increase to 1.5g bid. His RF is markedly elevated.  His scleroderma panel is negative.   02/11/22- patient  diuresed another 2L overnight. He is close to his baseline now at 3.5L/min.  He got up and took shower but reports dizziness.  He will have PT/OT and optimize for discharge.    02/12/22- patient ambulated around hall 482f with 3l/min [place] with  lowest spO2 90% and mild tachycardia.  He is cleared for dc home. He can continue cellcept at 1500 bid, pred 327mwith tapering by 35m61maily, bactrim DS TIW for pjp ppx, augmentin for 3 more days for aspiration pNA, and lasix 20 for recurrent effusion.    PAST MEDICAL HISTORY   Past Medical History:  Diagnosis Date   H/O immunosuppressive therapy    Hyperpigmentation of skin    Hyperpigmentation rash/vesicles of the palm and face   Neutropenia (HCC)    RA (rheumatoid arthritis) ([facility]    SURGICAL HISTORY   Past Surgical History:  Procedure Laterality Date   ESOPHAGOGASTRODUODENOSCOPY N/A 09/03/2021   Procedure: ESOPHAGOGASTRODUODENOSCOPY (EGD);  Surgeon: LocLesly RubensteinD;  [facility];  Service: Endoscopy;  Laterality: N/A;   IR GASTROSTOMY TUBE MOD SED  09/11/2021     FAMILY HISTORY   Family History  Problem Relation Age of Onset   Hyperlipidemia Mother    Hypertension Father      SOCIAL HISTORY   Social History   Tobacco Use   Smoking status: Former    Types: Cigarettes   Smokeless tobacco: Never  Vaping Use   Vaping Use: Never used  Substance Use Topics   Alcohol use: Not Currently   Drug use: Not Currently    Types: Marijuana     MEDICATIONS    Home Medication:     Current Medication:  Current Facility-Administered Medications:    acetaminophen (TYLENOL) tablet 650 mg, 650 mg, Oral, Q6H PRN, 650 mg at 02/08/22 1149 **OR** acetaminophen (TYLENOL) suppository 650 mg, 650 mg, Rectal, Q6H PRN, DunJudd Gaudier MD   albuterol (PROVENTIL) (2.5 MG/3ML) 0.083% nebulizer solution 2.5 mg, 2.5 mg, Nebulization, Q6H PRN, SrePriscella Mannudheer B, MD   amoxicillin-clavulanate (AUGMENTIN) 875-125 MG per tablet 1 tablet, 1 tablet, Oral, Q12H, Sreenath, Sudheer B, MD, 1 tablet at 02/12/22 0819   enoxaparin (LOVENOX) injection 40 mg, 40 mg, Subcutaneous, Q24H, DunDamita Dunningsazel V, MD   furosemide (LASIX) injection 40 mg, 40 mg, Intravenous, Daily,  AleLanney Ginsuad, MD, 40 mg at 02/12/22 0818   guaiFENesin (MUCINEX) 12 hr tablet 600 mg, 600 mg, Oral, BID, KumVal RilesD, 600 mg at 02/12/22 0819   HYDROcodone bit-homatropine (HYCODAN) 5-1.5 MG/5ML syrup 5 mL, 5 mL, Oral, Q6H PRN, KumVal RilesD, 5 mL at 02/10/22 1056   hydroxychloroquine (PLAQUENIL) tablet 200 mg, 200 mg, Per Tube, BID, DunJudd Gaudier MD, 200 mg at 02/12/22 0824970mycophenolate (CELLCEPT) capsule 1,500 mg, 1,500 mg, Oral, BID, AleLanney Ginsuad, MD, 1,500 mg at 02/12/22 0820   ondansetron (ZOFRAN) tablet 4 mg, 4 mg, Oral, Q6H PRN **OR** ondansetron (ZOFRAN) injection 4 mg, 4 mg, Intravenous, Q6H PRN, DunJudd Gaudier MD, 4 mg at 02/07/22 0458   pantoprazole (PROTONIX) EC tablet 40 mg, 40 mg, Oral, Daily, DunJudd Gaudier MD, 40 mg at 02/12/22 0818   polyethylene glycol (MIRALAX / GLYCOLAX) packet 17 g, 17 g, Oral, Daily PRN, DunAthena MasseD   [START ON 02/13/2022] predniSONE (DELTASONE) tablet 30 mg, 30 mg, Oral, Q breakfast **FOLLOWED BY** [START ON 02/14/2022] predniSONE (DELTASONE) tablet 25 mg, 25 mg, Oral, Q breakfast **FOLLOWED BY** [START ON 02/15/2022] predniSONE (DELTASONE) tablet  20 mg, 20 mg, Oral, Q breakfast **FOLLOWED BY** [START ON 02/16/2022] predniSONE (DELTASONE) tablet 15 mg, 15 mg, Oral, Q breakfast **FOLLOWED BY** [START ON 02/17/2022] predniSONE (DELTASONE) tablet 10 mg, 10 mg, Oral, Q breakfast, Sreenath, Sudheer B, MD   sulfamethoxazole-trimethoprim (BACTRIM DS) 800-160 MG per tablet 1 tablet, 1 tablet, Oral, Once per day on Mon Wed Fri, Sharnell Knight, MD, 1 tablet at 02/12/22 1610   traMADol (ULTRAM) tablet 50 mg, 50 mg, Oral, Q6H PRN, Max Sane, MD, 50 mg at 02/12/22 9604    ALLERGIES   Patient has no known allergies.     REVIEW OF SYSTEMS    Review of Systems:  Gen:  Denies  fever, sweats, chills weigh loss  HEENT: Denies blurred vision, double vision, ear pain, eye pain, hearing loss, nose bleeds, sore throat Cardiac:  No  dizziness, chest pain or heaviness, chest tightness,edema Resp:   reports dyspnea chronically  Gi: Denies swallowing difficulty, stomach pain, nausea or vomiting, diarrhea, constipation, bowel incontinence Gu:  Denies bladder incontinence, burning urine Ext:   Denies Joint pain, stiffness or swelling Skin: Denies  skin rash, easy bruising or bleeding or hives Endoc:  Denies polyuria, polydipsia , polyphagia or weight change Psych:   Denies depression, insomnia or hallucinations   Other:  All other systems negative   VS: BP 102/70 (BP Location: Left Arm)   Pulse 88   Temp 98.1 F (36.7 C) (Oral)   Resp 20   Ht _0  (1.956 m)   Wt 77.1 kg   SpO2 94%   BMI 20.16 kg/m      PHYSICAL EXAM    GENERAL:NAD, no fevers, chills, no weakness no fatigue HEAD: Normocephalic, atraumatic.  EYES: Pupils equal, round, reactive to light. Extraocular muscles intact. No scleral icterus.  MOUTH: Moist mucosal membrane. Dentition intact. No abscess noted.  EAR, NOSE, THROAT: Clear without exudates. No external lesions.  NECK: Supple. No thyromegaly. No nodules. No JVD.  PULMONARY: decreased breath sounds with mild rhonchi worse at bases bilaterally.  CARDIOVASCULAR: S1 and S2. Regular rate and rhythm. No murmurs, rubs, or gallops. No edema. Pedal pulses 2+ bilaterally.  GASTROINTESTINAL: Soft, nontender, nondistended. No masses. Positive bowel sounds. No hepatosplenomegaly.  MUSCULOSKELETAL: No swelling, clubbing, or edema. Range of motion full in all extremities.  NEUROLOGIC: Cranial nerves II through XII are intact. No gross focal neurological deficits. Sensation intact. Reflexes intact.  SKIN: No ulceration, lesions, rashes, or cyanosis. Skin warm and dry. Turgor intact.  PSYCHIATRIC: Mood, affect within normal limits. The patient is awake, alert and oriented x 3. Insight, judgment intact.       IMAGING           Narrative & Impression  CLINICAL DATA:  Esophageal perforation,  progressive hypoxia, cough, chest pain, dyspnea.   EXAM: CT CHEST WITH CONTRAST   TECHNIQUE: Multidetector CT imaging of the chest was performed during intravenous contrast administration.   RADIATION DOSE REDUCTION: This exam was performed according to the departmental dose-optimization program which includes automated exposure control, adjustment of the mA and/or kV according to patient size and/or use of iterative reconstruction technique.   CONTRAST:  3m OMNIPAQUE IOHEXOL 300 MG/ML  SOLN   COMPARISON:  01/29/2022, 09/01/2021   FINDINGS: Cardiovascular: No significant coronary artery calcification. Global cardiac size within normal limits. No pericardial effusion. Central pulmonary arteries are enlarged, similar to prior examination, in keeping with changes of pulmonary arterial hypertension. The thoracic aorta is unremarkable.   Mediastinum/Nodes: Pathologic thoracic adenopathy predominantly within  the aorta pulmonary window appears stable since prior examination with the index lymph nodes measuring 12-15 mm in short axis diameter. Visualized thyroid is unremarkable. The esophagus is unremarkable. No pneumomediastinum.   Lungs/Pleura: Mild paraseptal emphysema. Bibasilar pulmonary consolidative infiltrates with associated volume loss appears stable since prior examination. Numerous superimposed cysts are identified, similar to prior examination, as well as scattered inflammatory appearing centrilobular nodules and, together, the findings raise the question of an underlying interstitial lung disease such as lymphocytic interstitial pneumonia. Necrotizing infection, however, could appear similarly, as can be seen with recurrent aspiration. No pneumothorax. Small bilateral pleural effusions are again identified. No central obstructing lesion.   Upper Abdomen: No acute abnormality.   Musculoskeletal: No chest wall abnormality. No acute or significant osseous  findings.   IMPRESSION: 1. Stable bibasilar pulmonary consolidative infiltrates with associated volume loss. Numerous superimposed cysts are similar to prior examination, as well as scattered inflammatory appearing centrilobular nodules and, together, the findings raise the question of an underlying interstitial lung disease such as lymphocytic interstitial pneumonia. Necrotizing infection, however, could appear similarly, as can be seen with recurrent aspiration. 2. Stable small bilateral pleural effusions. 3. Mild paraseptal emphysema. 4. Stable mediastinal adenopathy, possibly reactive or inflammatory. 5. Stable enlargement of the central pulmonary arteries in keeping with changes of pulmonary arterial hypertension.     ASSESSMENT/PLAN   Acute on hypoxemic respiratory failure - present on admission  - COVID19 negative  - supplemental O2 during my evaluation 4L/min>>>3L - will perform infectious workup for pneumonia -Respiratory viral panel- negative  -serum fungitell -legionella ab -strep pneumoniae ur AG -Histoplasma Ur Ag -sputum resp cultures- negative to date  -AFB sputum expectorated specimen -sputum cytology  -reviewed pertinent imaging with patient today - ESR/CRP/RF- elevated  -PT/OT for d/c planning  -please encourage patient to use incentive spirometer few times each hour while hospitalized.       Esophageal perforation with PEG status -Patient is status post GI and ENT evaluation, states that he has been cleared to have p.o. fluids and ulcer is well-healing after most recent antimicrobials.  He stopped using his PEG tube and was eating normally p.o. this point.  His most recent swallow evaluation was abnormal he was recommended to have no oral nourishment.   Rheumatoid arthritis with acute exacerbation of interstitial lung disease -This appears to have occurred after cessation of Humira.  Patient has finally been able to wean down on prednisone therapy down  to 10 mg for the first time in over a year however after he was recommended to DC Humira he reports progressive decline over period of 1 to 2 weeks with increased O2 requirement and resultant hospitalization.  I have restarted Solu-Medrol at this time.  Patient is able to speak in full sentences and is not having labored breathing and is not in distress during my evaluation. -Agree with continuing Plaquenil -Patient recently had ophthalmology evaluation and it was recommended for him to continue this medication. -Continue solumedrol 1m until 02/09/22 then may dc home with pred 563mto be tapered by 83m24mvery day.  Thank you for allowing me to participate in the care of this patient.   Patient/Family are satisfied with care plan and all questions have been answered.    Provider disclosure: Patient with at least one acute or chronic illness or injury that poses a threat to life or bodily function and is being managed actively during this encounter.  All of the below services have been performed independently by signing provider:  review of prior documentation from internal and or external health records.  Review of previous and current lab results.  Interview and comprehensive assessment during patient visit today. Review of current and previous chest radiographs/CT scans. Discussion of management and test interpretation with health care team and patient/family.   This document was prepared using Dragon voice recognition software and may include unintentional dictation errors.     Ottie Glazier, M.D.  Division of Pulmonary & Critical Care Medicine

## 2022-02-12 NOTE — Evaluation (Addendum)
Physical Therapy Evaluation Patient Details Name: Sean [name] MRN: [id number] DOB: February 07, 1976 Today's Date: 02/12/2022  History of Present Illness  Sean [name] is a 46yoM who comes to [facility] on 02/07/22 c SOB. PMH: connective tissue disorder, esophageal perforation s/p PEG tube, RA, ILD on 3L at home 'as needed'. Pt has weaned O2 needs down, but on 11/2 was complaining of dizziness while walking. PTA pt is on fluctuating doses of prednisone, was intermittently tolerating resistance workouts at gym. Has O2 as needed at home. Still drives, fully independent ADL, no falls history, no used of AD.  Clinical Impression  Pt in bed on entry working on his meal. He is agreeable to session. Pt now down to 3L/min, able to perform entire session at this level with only brief mild desaturation to 88% and quick recovery, dyspnea not especially limiting for this activity. Pt reports good confidence in ability to be safe and mobile at home today. If moved to his portable concentrator (which is in room) he should have clearance to AMB ad lib on unit. Pt educated on continuing his gym resistance workouts and how to modify the intensity when in periods of exacerbation for joint pain issues. No additional services needed at this time. No PT followup needed. No DME needed at this time. PT signing off.      Recommendations for follow up therapy are one component of a multi-disciplinary discharge planning process, led by the attending physician.  Recommendations may be updated based on patient status, additional functional criteria and insurance authorization.  Follow Up Recommendations No PT follow up      Assistance Recommended at Discharge None  Patient can return home with the following       Equipment Recommendations None recommended by PT  Recommendations for Other Services       Functional Status Assessment Patient has had a recent decline in their functional status and demonstrates the ability to  make significant improvements in function in a reasonable and predictable amount of time.     Precautions / Restrictions Precautions Precautions: None      Mobility  Bed Mobility Overal bed mobility: Independent                  Transfers Overall transfer level: Independent                      Ambulation/Gait Ambulation/Gait assistance: Modified independent (Device/Increase time) Gait Distance (Feet): 380 Feet Assistive device: None Gait Pattern/deviations: WFL(Within Functional Limits) Gait velocity: 1.36m/s     General Gait Details: pt dons slides; author manages O2 tank, 1st lap on 4L at 96%, then 3L (as received) at 88% SpO2. No LOB, no limitiing dyspnea, no device used.  Stairs            Wheelchair Mobility    Modified Rankin (Stroke Patients Only)       Balance                                             Pertinent Vitals/Pain Pain Assessment Pain Assessment:  (his typical RA pain in hands; achy; at goal at present and taking pain meds)    Home Living Family/patient expects to be discharged to:: Private residence Living Arrangements: Spouse/significant other;Children (Mom, Dad; son who is a Holiday representative)   Type of Home: House Home Access: Stairs to enter  Entrance Stairs-Number of Steps: 2   Home Layout: One level Home Equipment: Conservation officer, nature (2 wheels)      Prior Function Prior Level of Function : Driving                     Hand Dominance        Extremity/Trunk Assessment                Communication      Cognition                                                General Comments      Exercises     Assessment/Plan    PT Assessment Patient does not need any further PT services  PT Problem List         PT Treatment Interventions Functional mobility training;Therapeutic activities;Patient/family education    PT Goals (Current goals can be found in the Care  Plan section)  Acute Rehab PT Goals PT Goal Formulation: All assessment and education complete, DC therapy    Frequency       Co-evaluation               AM-PAC PT "6 Clicks" Mobility  Outcome Measure Help needed turning from your back to your side while in a flat bed without using bedrails?: None Help needed moving from lying on your back to sitting on the side of a flat bed without using bedrails?: None Help needed moving to and from a bed to a chair (including a wheelchair)?: None Help needed standing up from a chair using your arms (e.g., wheelchair or bedside chair)?: None Help needed to walk in hospital room?: None Help needed climbing 3-5 steps with a railing? : None 6 Click Score: 24    End of Session Equipment Utilized During Treatment: Oxygen Activity Tolerance: Patient tolerated treatment well;No increased pain Patient left: with family/visitor present Nurse Communication: Mobility status      Time: 8466-5993 PT Time Calculation (min) (ACUTE ONLY): 30 min   Charges:   PT Evaluation $PT Eval Moderate Complexity: 1 Mod PT Treatments $Self Care/Home Management: 8-22       4:53 PM, 02/12/22 Etta Grandchild, PT, DPT Physical Therapist - [facility]  [phone] ([place])    [place] 02/12/2022, 4:50 PM

## 2022-04-14 DIAGNOSIS — R0781 Pleurodynia: Secondary | ICD-10-CM

## 2022-04-29 DIAGNOSIS — M3390 Dermatopolymyositis, unspecified, organ involvement unspecified: Secondary | ICD-10-CM | POA: Insufficient documentation

## 2022-04-29 DIAGNOSIS — I272 Pulmonary hypertension, unspecified: Secondary | ICD-10-CM | POA: Insufficient documentation

## 2022-04-29 DIAGNOSIS — I2721 Secondary pulmonary arterial hypertension: Secondary | ICD-10-CM | POA: Insufficient documentation

## 2022-05-25 ENCOUNTER — Encounter: Payer: Medicaid Other | Attending: Critical Care Medicine | Admitting: *Deleted

## 2022-05-25 DIAGNOSIS — J849 Interstitial pulmonary disease, unspecified: Secondary | ICD-10-CM | POA: Insufficient documentation

## 2022-05-25 DIAGNOSIS — R945 Abnormal results of liver function studies: Secondary | ICD-10-CM | POA: Insufficient documentation

## 2022-05-25 DIAGNOSIS — D509 Iron deficiency anemia, unspecified: Secondary | ICD-10-CM | POA: Insufficient documentation

## 2022-05-25 DIAGNOSIS — R739 Hyperglycemia, unspecified: Secondary | ICD-10-CM | POA: Insufficient documentation

## 2022-05-25 DIAGNOSIS — E785 Hyperlipidemia, unspecified: Secondary | ICD-10-CM | POA: Insufficient documentation

## 2022-05-25 NOTE — Progress Notes (Signed)
Initial phone call completed. Diagnosis can be found in [facility] 2/5. EP Orientation scheduled for Wednesday, 2/21 at 2:30.

## 2022-06-02 ENCOUNTER — Encounter: Payer: Medicaid Other | Admitting: *Deleted

## 2022-06-02 ENCOUNTER — Ambulatory Visit: Payer: Medicaid Other

## 2022-06-02 VITALS — Ht 77.5 in | Wt 153.5 lb

## 2022-06-02 DIAGNOSIS — R945 Abnormal results of liver function studies: Secondary | ICD-10-CM | POA: Diagnosis not present

## 2022-06-02 DIAGNOSIS — D509 Iron deficiency anemia, unspecified: Secondary | ICD-10-CM | POA: Diagnosis not present

## 2022-06-02 DIAGNOSIS — R739 Hyperglycemia, unspecified: Secondary | ICD-10-CM | POA: Diagnosis not present

## 2022-06-02 DIAGNOSIS — E785 Hyperlipidemia, unspecified: Secondary | ICD-10-CM | POA: Diagnosis not present

## 2022-06-02 DIAGNOSIS — J849 Interstitial pulmonary disease, unspecified: Secondary | ICD-10-CM

## 2022-06-02 NOTE — Patient Instructions (Signed)
Patient Instructions  Patient Details  Name: Sean [name] MRN: [id number] Date of Birth: 1976-01-08 Referring Provider:  Vonita Moss, MD  Below are your personal goals for exercise, nutrition, and risk factors. Our goal is to help you stay on track towards obtaining and maintaining these goals. We will be discussing your progress on these goals with you throughout the program.  Initial Exercise Prescription:  Initial Exercise Prescription - 06/02/22 1100       Date of Initial Exercise RX and Referring Provider   Date 06/02/22    Referring Provider Vonita Moss MD      Oxygen   Oxygen Continuous    Liters 1-2    Maintain Oxygen Saturation 88% or higher      Treadmill   MPH 2.3    Grade 0.5    Minutes 15    METs 2.92      REL-XR   Level 2    Speed 50    Minutes 15    METs 2.5      T5 Nustep   Level 2    SPM 80    Minutes 15    METs 2.5      Track   Laps 34    Minutes 15    METs 2.85      Prescription Details   Frequency (times per week) 2    Duration Progress to 30 minutes of continuous aerobic without signs/symptoms of physical distress      Intensity   THRR 40-80% of Max Heartrate 132-161    Ratings of Perceived Exertion 11-13    Perceived Dyspnea 0-4      Progression   Progression Continue to progress workloads to maintain intensity without signs/symptoms of physical distress.      Resistance Training   Training Prescription Yes    Weight 5 lb    Reps 10-15             Exercise Goals: Frequency: Be able to perform aerobic exercise two to three times per week in program working toward 2-5 days per week of home exercise.  Intensity: Work with a perceived exertion of 11 (fairly light) - 15 (hard) while following your exercise prescription.  We will make changes to your prescription with you as you progress through the program.   Duration: Be able to do 30 to 45 minutes of continuous aerobic exercise in addition to a 5 minute warm-up and a 5  minute cool-down routine.   Nutrition Goals: Your personal nutrition goals will be established when you do your nutrition analysis with the dietician.  The following are general nutrition guidelines to follow: Cholesterol < 296m/day Sodium < 15061mday Fiber: Men under 50 yrs - 38 grams per day  Personal Goals:  Personal Goals and Risk Factors at Admission - 06/02/22 1135       Core Components/Risk Factors/Patient Goals on Admission    Weight Management Yes;Weight Gain    Intervention Weight Management: Develop a combined nutrition and exercise program designed to reach desired caloric intake, while maintaining appropriate intake of nutrient and fiber, sodium and fats, and appropriate energy expenditure required for the weight goal.;Weight Management: Provide education and appropriate resources to help participant work on and attain dietary goals.    Admit Weight 153 lb 8 oz (69.6 kg)    Goal Weight: Short Term 160 lb (72.6 kg)    Goal Weight: Long Term 165 lb (74.8 kg)    Expected Outcomes Long Term: Adherence to nutrition and  physical activity/exercise program aimed toward attainment of established weight goal;Short Term: Continue to assess and modify interventions until short term weight is achieved;Weight Gain: Understanding of general recommendations for a high calorie, high protein meal plan that promotes weight gain by distributing calorie intake throughout the day with the consumption for 4-5 meals, snacks, and/or supplements;Weight Maintenance: Understanding of the daily nutrition guidelines, which includes 25-35% calories from fat, 7% or less cal from saturated fats, less than 232m cholesterol, less than 1.5gm of sodium, & 5 or more servings of fruits and vegetables daily    Improve shortness of breath with ADL's Yes    Intervention Provide education, individualized exercise plan and daily activity instruction to help decrease symptoms of SOB with activities of daily living.     Expected Outcomes Short Term: Improve cardiorespiratory fitness to achieve a reduction of symptoms when performing ADLs;Long Term: Be able to perform more ADLs without symptoms or delay the onset of symptoms    Increase knowledge of respiratory medications and ability to use respiratory devices properly  Yes    Intervention Provide education and demonstration as needed of appropriate use of medications, inhalers, and oxygen therapy.    Expected Outcomes Short Term: Achieves understanding of medications use. Understands that oxygen is a medication prescribed by physician. Demonstrates appropriate use of inhaler and oxygen therapy.;Long Term: Maintain appropriate use of medications, inhalers, and oxygen therapy.    Diabetes Yes    Intervention Provide education about signs/symptoms and action to take for hypo/hyperglycemia.;Provide education about proper nutrition, including hydration, and aerobic/resistive exercise prescription along with prescribed medications to achieve blood glucose in normal ranges: Fasting glucose 65-99 mg/dL    Expected Outcomes Short Term: Participant verbalizes understanding of the signs/symptoms and immediate care of hyper/hypoglycemia, proper foot care and importance of medication, aerobic/resistive exercise and nutrition plan for blood glucose control.;Long Term: Attainment of HbA1C < 7%.    Hypertension Yes    Intervention Provide education on lifestyle modifcations including regular physical activity/exercise, weight management, moderate sodium restriction and increased consumption of fresh fruit, vegetables, and low fat dairy, alcohol moderation, and smoking cessation.;Monitor prescription use compliance.    Expected Outcomes Short Term: Continued assessment and intervention until BP is < 140/977mHG in hypertensive participants. < 130/8066mG in hypertensive participants with diabetes, heart failure or chronic kidney disease.;Long Term: Maintenance of blood pressure at goal  levels.             Tobacco Use Initial Evaluation: Social History   Tobacco Use  Smoking Status Former   Types: Cigarettes  Smokeless Tobacco Never    Exercise Goals and Review:  Exercise Goals     Row Name 06/02/22 1134             Exercise Goals   Increase Physical Activity Yes       Intervention Provide advice, education, support and counseling about physical activity/exercise needs.;Develop an individualized exercise prescription for aerobic and resistive training based on initial evaluation findings, risk stratification, comorbidities and participant's personal goals.       Expected Outcomes Short Term: Attend rehab on a regular basis to increase amount of physical activity.;Long Term: Exercising regularly at least 3-5 days a week.;Long Term: Add in home exercise to make exercise part of routine and to increase amount of physical activity.       Increase Strength and Stamina Yes       Intervention Provide advice, education, support and counseling about physical activity/exercise needs.;Develop an individualized exercise prescription for  aerobic and resistive training based on initial evaluation findings, risk stratification, comorbidities and participant's personal goals.       Expected Outcomes Short Term: Increase workloads from initial exercise prescription for resistance, speed, and METs.;Short Term: Perform resistance training exercises routinely during rehab and add in resistance training at home;Long Term: Improve cardiorespiratory fitness, muscular endurance and strength as measured by increased METs and functional capacity (6MWT)       Able to understand and use rate of perceived exertion (RPE) scale Yes       Intervention Provide education and explanation on how to use RPE scale       Expected Outcomes Short Term: Able to use RPE daily in rehab to express subjective intensity level;Long Term:  Able to use RPE to guide intensity level when exercising independently        Able to understand and use Dyspnea scale Yes       Intervention Provide education and explanation on how to use Dyspnea scale       Expected Outcomes Short Term: Able to use Dyspnea scale daily in rehab to express subjective sense of shortness of breath during exertion;Long Term: Able to use Dyspnea scale to guide intensity level when exercising independently       Knowledge and understanding of Target Heart Rate Range (THRR) Yes       Intervention Provide education and explanation of THRR including how the numbers were predicted and where they are located for reference       Expected Outcomes Short Term: Able to state/look up THRR;Short Term: Able to use daily as guideline for intensity in rehab;Long Term: Able to use THRR to govern intensity when exercising independently       Able to check pulse independently Yes       Intervention Provide education and demonstration on how to check pulse in carotid and radial arteries.;Review the importance of being able to check your own pulse for safety during independent exercise       Expected Outcomes Short Term: Able to explain why pulse checking is important during independent exercise;Long Term: Able to check pulse independently and accurately       Understanding of Exercise Prescription Yes       Intervention Provide education, explanation, and written materials on patient's individual exercise prescription       Expected Outcomes Short Term: Able to explain program exercise prescription;Long Term: Able to explain home exercise prescription to exercise independently              Copy of goals given to participant.

## 2022-06-02 NOTE — Progress Notes (Signed)
Pulmonary Individual Treatment Plan  Patient Details  Name: Sean [name] MRN: [id number] Date of Birth: [birth year] Referring Provider:   Flowsheet Row Pulmonary Rehab from 06/02/2022 in [facility] Cardiac and Pulmonary Rehab  Referring Provider Vonita Moss MD       Initial Encounter Date:  Flowsheet Row Pulmonary Rehab from 06/02/2022 in [facility] Cardiac and Pulmonary Rehab  Date 06/02/22       Visit Diagnosis: ILD (interstitial lung disease) ([place])  Patient's Home Medications on Admission:  Current Outpatient Medications:    acetaminophen (TYLENOL) 500 MG tablet, Take by mouth., Disp: , Rfl:    albuterol (VENTOLIN HFA) 108 (90 Base) MCG/ACT inhaler, Inhale 2 puffs into the lungs every 6 (six) hours as needed for wheezing or shortness of breath., Disp: 8 g, Rfl: 2   blood glucose meter kit and supplies KIT, Dispense based on patient and insurance preference. Use up to four times daily as directed., Disp: 1 each, Rfl: 0   blood glucose meter kit and supplies, Dispense based on patient and insurance preference. Use up to four times daily as directed. (FOR ICD-10 E10.9, E11.9)., Disp: 1 each, Rfl: 0   Blood Glucose Monitoring Suppl (CONTOUR NEXT ONE) KIT, 4 (four) times daily., Disp: , Rfl:    BREZTRI AEROSPHERE 160-9-4.8 MCG/ACT AERO, Inhale 2 puffs into the lungs 2 (two) times daily., Disp: 10.7 g, Rfl: 0   chlorhexidine (PERIDEX) 0.12 % solution, 15 mLs 2 (two) times daily. (Patient not taking: Reported on 05/25/2022), Disp: , Rfl:    Cholecalciferol (VITAMIN D-1000 MAX ST) 25 MCG (1000 UT) tablet, Take by mouth., Disp: , Rfl:    clobetasol ointment (TEMOVATE) 0.05 %, Apply topically., Disp: , Rfl:    furosemide (LASIX) 20 MG tablet, Take 1 tablet (20 mg total) by mouth daily., Disp: 30 tablet, Rfl: 0   hydroxychloroquine (PLAQUENIL) 200 MG tablet, Place 1 tablet (200 mg total) into feeding tube 2 (two) times daily. (Patient not taking: Reported on 05/25/2022), Disp: , Rfl:    insulin aspart  (NOVOLOG) 100 UNIT/ML FlexPen, Inject 1-9 Units into the skin every 6 (six) hours. CBG 70 - 120: 0 units CBG 121 - 150: 1 unit CBG 151 - 200: 2 units CBG 201 - 250: 3 units CBG 251 - 300: 5 units CBG 301 - 350: 7 units CBG 351 - 400: 9 units CBG > 400: call MD and obtain STAT lab verification, Disp: 15 mL, Rfl: 11   Insulin Pen Needle (PEN NEEDLES 3/16") 31G X 5 MM MISC, 1 pen. by Does not apply route every 6 (six) hours., Disp: 200 each, Rfl: 1   lidocaine (XYLOCAINE) 2 % solution, Use as directed 15 mLs in the mouth or throat every 6 (six) hours as needed (mouth/throat pain). (Patient not taking: Reported on 05/25/2022), Disp: 100 mL, Rfl: 0   meloxicam (MOBIC) 15 MG tablet, Take by mouth., Disp: , Rfl:    Multiple Vitamins-Minerals (SUPER THERA VITE M) TABS, Take 1 tablet by mouth daily., Disp: , Rfl:    NONFORMULARY OR COMPOUNDED ITEM, LIDOCAINE/MAALOX XS 1:1 (Patient not taking: Reported on 05/25/2022), Disp: , Rfl:    pantoprazole (PROTONIX) 40 MG tablet, Take 1 tablet (40 mg total) by mouth daily. (Patient not taking: Reported on 05/25/2022), Disp: 30 tablet, Rfl: 0   predniSONE (DELTASONE) 5 MG tablet, Take by mouth., Disp: , Rfl:    sulfamethoxazole-trimethoprim (BACTRIM) 400-80 MG tablet, Take by mouth., Disp: , Rfl:    tacrolimus (PROTOPIC) 0.1 % ointment, Apply topically  2 (two) times daily., Disp: , Rfl:    thiamine (VITAMIN B1) 100 MG tablet, Take by mouth., Disp: , Rfl:    traMADol (ULTRAM) 50 MG tablet, Take by mouth., Disp: , Rfl:    triamcinolone cream (KENALOG) 0.1 %, Apply topically 2 (two) times daily., Disp: , Rfl:    Vitamin D 12.5 MCG/0.25ML LIQD, Give 1 mL by tube daily. (Patient not taking: Reported on 05/25/2022), Disp: 36 mL, Rfl: 1  Past Medical History: Past Medical History:  Diagnosis Date   H/O immunosuppressive therapy    Hyperpigmentation of skin    Hyperpigmentation rash/vesicles of the palm and face   Neutropenia (HCC)    RA (rheumatoid arthritis) ([place])      Tobacco Use: Social History   Tobacco Use  Smoking Status Former   Types: Cigarettes  Smokeless Tobacco Never    Labs: Review Flowsheet       Latest Ref Rng & Units 07/26/2021 09/10/2021 01/12/2022  Labs for ITP Cardiac and Pulmonary Rehab  Cholestrol 0 - 200 mg/dL - - 176   LDL (calc) 0 - 99 mg/dL - - 91   HDL-C >40 mg/dL - - 38   Trlycerides <150 mg/dL 187  - 234   Hemoglobin A1c 4.8 - 5.6 % - 11.9  6.4      Pulmonary Assessment Scores:  Pulmonary Assessment Scores     Row Name 06/02/22 1137         ADL UCSD   ADL Phase Exit       mMRC Score   mMRC Score 4              UCSD: Self-administered rating of dyspnea associated with activities of daily living (ADLs) 6-point scale (0 = "not at all" to 5 = "maximal or unable to do because of breathlessness")  Scoring Scores range from 0 to 120.  Minimally important difference is 5 units  CAT: CAT can identify the health impairment of COPD patients and is better correlated with disease progression.  CAT has a scoring range of zero to 40. The CAT score is classified into four groups of low (less than 10), medium (10 - 20), high (21-30) and very high (31-40) based on the impact level of disease on health status. A CAT score over 10 suggests significant symptoms.  A worsening CAT score could be explained by an exacerbation, poor medication adherence, poor inhaler technique, or progression of COPD or comorbid conditions.  CAT MCID is 2 points  mMRC: mMRC (Modified Medical Research Council) Dyspnea Scale is used to assess the degree of baseline functional disability in patients of respiratory disease due to dyspnea. No minimal important difference is established. A decrease in score of 1 point or greater is considered a positive change.   Pulmonary Function Assessment:   Exercise Target Goals: Exercise Program Goal: Individual exercise prescription set using results from initial 6 min walk test and THRR while  considering  patient's activity barriers and safety.   Exercise Prescription Goal: Initial exercise prescription builds to 30-45 minutes a day of aerobic activity, 2-3 days per week.  Home exercise guidelines will be given to patient during program as part of exercise prescription that the participant will acknowledge.  Education: Aerobic Exercise: - Group verbal and visual presentation on the components of exercise prescription. Introduces F.I.T.T principle from ACSM for exercise prescriptions.  Reviews F.I.T.T. principles of aerobic exercise including progression. Written material given at graduation.   Education: Resistance Exercise: - Group verbal and visual  presentation on the components of exercise prescription. Introduces F.I.T.T principle from ACSM for exercise prescriptions  Reviews F.I.T.T. principles of resistance exercise including progression. Written material given at graduation.    Education: Exercise & Equipment Safety: - Individual verbal instruction and demonstration of equipment use and safety with use of the equipment. Flowsheet Row Pulmonary Rehab from 06/02/2022 in [facility] Cardiac and Pulmonary Rehab  Date 06/02/22  Educator [facility]  Instruction Review Code 1- Verbalizes Understanding       Education: Exercise Physiology & General Exercise Guidelines: - Group verbal and written instruction with models to review the exercise physiology of the cardiovascular system and associated critical values. Provides general exercise guidelines with specific guidelines to those with heart or lung disease.    Education: Flexibility, Balance, Mind/Body Relaxation: - Group verbal and visual presentation with interactive activity on the components of exercise prescription. Introduces F.I.T.T principle from ACSM for exercise prescriptions. Reviews F.I.T.T. principles of flexibility and balance exercise training including progression. Also discusses the mind body connection.  Reviews various  relaxation techniques to help reduce and manage stress (i.e. Deep breathing, progressive muscle relaxation, and visualization). Balance handout provided to take home. Written material given at graduation.   Activity Barriers & Risk Stratification:  Activity Barriers & Cardiac Risk Stratification - 06/02/22 1127       Activity Barriers & Cardiac Risk Stratification   Activity Barriers Shortness of Breath;Deconditioning;Muscular Weakness             6 Minute Walk:  6 Minute Walk     Row Name 06/02/22 1125         6 Minute Walk   Phase Initial     Distance 1261 feet     Walk Time 6 minutes     # of Rest Breaks 0     MPH 2.39     METS 5.42     RPE 13     Perceived Dyspnea  3     VO2 Peak 18.99     Symptoms Yes (comment)     Comments SOB     Resting HR 102 bpm     Resting BP 104/58     Resting Oxygen Saturation  87 %     Exercise Oxygen Saturation  during 6 min walk 79 %     Max Ex. HR 125 bpm     Max Ex. BP 136/74     2 Minute Post BP 122/66       Interval HR   1 Minute HR 113     2 Minute HR 117     3 Minute HR 122     4 Minute HR 125     5 Minute HR 125     6 Minute HR 125     2 Minute Post HR 107     Interval Heart Rate? Yes       Interval Oxygen   Interval Oxygen? Yes     Baseline Oxygen Saturation % 93 %     1 Minute Oxygen Saturation % 87 %     1 Minute Liters of Oxygen 0 L  Room Air     2 Minute Oxygen Saturation % 86 %     2 Minute Liters of Oxygen 0 L     3 Minute Oxygen Saturation % 81 %     3 Minute Liters of Oxygen 0 L     4 Minute Oxygen Saturation % 80 %     4 Minute Liters  of Oxygen 0 L     5 Minute Oxygen Saturation % 80 %     5 Minute Liters of Oxygen 0 L     6 Minute Oxygen Saturation % 81 %  79% at 5:30     6 Minute Liters of Oxygen 0 L     2 Minute Post Oxygen Saturation % 90 %     2 Minute Post Liters of Oxygen 0 L             Oxygen Initial Assessment:  Oxygen Initial Assessment - 06/02/22 1136       Home Oxygen   Home  Oxygen Device E-Tanks;Home Concentrator    Sleep Oxygen Prescription None   2L when saturations get low at night as well   Home Exercise Oxygen Prescription None   encouraged to use   Home Resting Oxygen Prescription None    Compliance with Home Oxygen Use Yes      Initial 6 min Walk   Oxygen Used None      Program Oxygen Prescription   Program Oxygen Prescription Continuous;E-Tanks    Liters per minute 2    Comments use 1-2L for exercise to avoid desaturations with exercise      Intervention   Short Term Goals To learn and exhibit compliance with exercise, home and travel O2 prescription;To learn and understand importance of monitoring SPO2 with pulse oximeter and demonstrate accurate use of the pulse oximeter.;To learn and understand importance of maintaining oxygen saturations>88%;To learn and demonstrate proper pursed lip breathing techniques or other breathing techniques. ;To learn and demonstrate proper use of respiratory medications    Long  Term Goals Exhibits compliance with exercise, home  and travel O2 prescription;Verbalizes importance of monitoring SPO2 with pulse oximeter and return demonstration;Maintenance of O2 saturations>88%;Exhibits proper breathing techniques, such as pursed lip breathing or other method taught during program session;Compliance with respiratory medication;Demonstrates proper use of MDI's             Oxygen Re-Evaluation:   Oxygen Discharge (Final Oxygen Re-Evaluation):   Initial Exercise Prescription:  Initial Exercise Prescription - 06/02/22 1100       Date of Initial Exercise RX and Referring Provider   Date 06/02/22    Referring Provider Vonita Moss MD      Oxygen   Oxygen Continuous    Liters 1-2    Maintain Oxygen Saturation 88% or higher      Treadmill   MPH 2.3    Grade 0.5    Minutes 15    METs 2.92      REL-XR   Level 2    Speed 50    Minutes 15    METs 2.5      T5 Nustep   Level 2    SPM 80    Minutes 15     METs 2.5      Track   Laps 34    Minutes 15    METs 2.85      Prescription Details   Frequency (times per week) 2    Duration Progress to 30 minutes of continuous aerobic without signs/symptoms of physical distress      Intensity   THRR 40-80% of Max Heartrate 132-161    Ratings of Perceived Exertion 11-13    Perceived Dyspnea 0-4      Progression   Progression Continue to progress workloads to maintain intensity without signs/symptoms of physical distress.      Horticulturist, commercial Prescription  Yes    Weight 5 lb    Reps 10-15             Perform Capillary Blood Glucose checks as needed.  Exercise Prescription Changes:   Exercise Prescription Changes     Row Name 06/02/22 1100             Response to Exercise   Blood Pressure (Admit) 104/58       Blood Pressure (Exercise) 136/74       Blood Pressure (Exit) 108/60       Heart Rate (Admit) 102 bpm       Heart Rate (Exercise) 125 bpm       Heart Rate (Exit) 99 bpm       Oxygen Saturation (Admit) 93 %       Oxygen Saturation (Exercise) 79 %       Oxygen Saturation (Exit) 95 %       Rating of Perceived Exertion (Exercise) 13       Perceived Dyspnea (Exercise) 3       Symptoms SOB       Comments walk test results                Exercise Comments:   Exercise Goals and Review:   Exercise Goals     Row Name 06/02/22 1134             Exercise Goals   Increase Physical Activity Yes       Intervention Provide advice, education, support and counseling about physical activity/exercise needs.;Develop an individualized exercise prescription for aerobic and resistive training based on initial evaluation findings, risk stratification, comorbidities and participant's personal goals.       Expected Outcomes Short Term: Attend rehab on a regular basis to increase amount of physical activity.;Long Term: Exercising regularly at least 3-5 days a week.;Long Term: Add in home exercise to make exercise  part of routine and to increase amount of physical activity.       Increase Strength and Stamina Yes       Intervention Provide advice, education, support and counseling about physical activity/exercise needs.;Develop an individualized exercise prescription for aerobic and resistive training based on initial evaluation findings, risk stratification, comorbidities and participant's personal goals.       Expected Outcomes Short Term: Increase workloads from initial exercise prescription for resistance, speed, and METs.;Short Term: Perform resistance training exercises routinely during rehab and add in resistance training at home;Long Term: Improve cardiorespiratory fitness, muscular endurance and strength as measured by increased METs and functional capacity (6MWT)       Able to understand and use rate of perceived exertion (RPE) scale Yes       Intervention Provide education and explanation on how to use RPE scale       Expected Outcomes Short Term: Able to use RPE daily in rehab to express subjective intensity level;Long Term:  Able to use RPE to guide intensity level when exercising independently       Able to understand and use Dyspnea scale Yes       Intervention Provide education and explanation on how to use Dyspnea scale       Expected Outcomes Short Term: Able to use Dyspnea scale daily in rehab to express subjective sense of shortness of breath during exertion;Long Term: Able to use Dyspnea scale to guide intensity level when exercising independently       Knowledge and understanding of Target Heart Rate Range (THRR) Yes  Intervention Provide education and explanation of THRR including how the numbers were predicted and where they are located for reference       Expected Outcomes Short Term: Able to state/look up THRR;Short Term: Able to use daily as guideline for intensity in rehab;Long Term: Able to use THRR to govern intensity when exercising independently       Able to check pulse  independently Yes       Intervention Provide education and demonstration on how to check pulse in carotid and radial arteries.;Review the importance of being able to check your own pulse for safety during independent exercise       Expected Outcomes Short Term: Able to explain why pulse checking is important during independent exercise;Long Term: Able to check pulse independently and accurately       Understanding of Exercise Prescription Yes       Intervention Provide education, explanation, and written materials on patient's individual exercise prescription       Expected Outcomes Short Term: Able to explain program exercise prescription;Long Term: Able to explain home exercise prescription to exercise independently                Exercise Goals Re-Evaluation :   Discharge Exercise Prescription (Final Exercise Prescription Changes):  Exercise Prescription Changes - 06/02/22 1100       Response to Exercise   Blood Pressure (Admit) 104/58    Blood Pressure (Exercise) 136/74    Blood Pressure (Exit) 108/60    Heart Rate (Admit) 102 bpm    Heart Rate (Exercise) 125 bpm    Heart Rate (Exit) 99 bpm    Oxygen Saturation (Admit) 93 %    Oxygen Saturation (Exercise) 79 %    Oxygen Saturation (Exit) 95 %    Rating of Perceived Exertion (Exercise) 13    Perceived Dyspnea (Exercise) 3    Symptoms SOB    Comments walk test results             Nutrition:  Target Goals: Understanding of nutrition guidelines, daily intake of sodium <1569m, cholesterol <2088m calories 30% from fat and 7% or less from saturated fats, daily to have 5 or more servings of fruits and vegetables.  Education: All About Nutrition: -Group instruction provided by verbal, written material, interactive activities, discussions, models, and posters to present general guidelines for heart healthy nutrition including fat, fiber, MyPlate, the role of sodium in heart healthy nutrition, utilization of the nutrition  label, and utilization of this knowledge for meal planning. Follow up email sent as well. Written material given at graduation.   Biometrics:  Pre Biometrics - 06/02/22 1134       Pre Biometrics   Height 6' 5.5" (1.969 m)    Weight 153 lb 8 oz (69.6 kg)    Waist Circumference 28 inches    Hip Circumference 32 inches    Waist to Hip Ratio 0.88 %    BMI (Calculated) 17.96    Single Leg Stand 30 seconds              Nutrition Therapy Plan and Nutrition Goals:  Nutrition Therapy & Goals - 06/02/22 1135       Intervention Plan   Intervention Prescribe, educate and counsel regarding individualized specific dietary modifications aiming towards targeted core components such as weight, hypertension, lipid management, diabetes, heart failure and other comorbidities.    Expected Outcomes Short Term Goal: Understand basic principles of dietary content, such as calories, fat, sodium, cholesterol and nutrients.;Short Term  Goal: A plan has been developed with personal nutrition goals set during dietitian appointment.;Long Term Goal: Adherence to prescribed nutrition plan.             Nutrition Assessments:  MEDIFICTS Score Key: ?70 Need to make dietary changes  40-70 Heart Healthy Diet ? 40 Therapeutic Level Cholesterol Diet   Picture Your Plate Scores: D34-534 Unhealthy dietary pattern with much room for improvement. 41-50 Dietary pattern unlikely to meet recommendations for good health and room for improvement. 51-60 More healthful dietary pattern, with some room for improvement.  >60 Healthy dietary pattern, although there may be some specific behaviors that could be improved.   Nutrition Goals Re-Evaluation:   Nutrition Goals Discharge (Final Nutrition Goals Re-Evaluation):   Psychosocial: Target Goals: Acknowledge presence or absence of significant depression and/or stress, maximize coping skills, provide positive support system. Participant is able to verbalize types and  ability to use techniques and skills needed for reducing stress and depression.   Education: Stress, Anxiety, and Depression - Group verbal and visual presentation to define topics covered.  Reviews how body is impacted by stress, anxiety, and depression.  Also discusses healthy ways to reduce stress and to treat/manage anxiety and depression.  Written material given at graduation.   Education: Sleep Hygiene -Provides group verbal and written instruction about how sleep can affect your health.  Define sleep hygiene, discuss sleep cycles and impact of sleep habits. Review good sleep hygiene tips.    Initial Review & Psychosocial Screening:  Initial Psych Review & Screening - 05/25/22 1308       Initial Review   Current issues with Current Stress Concerns    Source of Stress Concerns Unable to perform yard/household activities;Unable to participate in former interests or hobbies      [place]? Yes   mom     Barriers   Psychosocial barriers to participate in program There are no identifiable barriers or psychosocial needs.;The patient should benefit from training in stress management and relaxation.      Screening Interventions   Interventions Encouraged to exercise;Provide feedback about the scores to participant;To provide support and resources with identified psychosocial needs    Expected Outcomes Long Term Goal: Stressors or current issues are controlled or eliminated.;Short Term goal: Utilizing psychosocial counselor, staff and physician to assist with identification of specific Stressors or current issues interfering with healing process. Setting desired goal for each stressor or current issue identified.;Short Term goal: Identification and review with participant of any Quality of Life or Depression concerns found by scoring the questionnaire.;Long Term goal: The participant improves quality of Life and PHQ9 Scores as seen by post scores and/or  verbalization of changes             Quality of Life Scores:  Scores of 19 and below usually indicate a poorer quality of life in these areas.  A difference of  2-3 points is a clinically meaningful difference.  A difference of 2-3 points in the total score of the Quality of Life Index has been associated with significant improvement in overall quality of life, self-image, physical symptoms, and general health in studies assessing change in quality of life.  PHQ-9: Review Flowsheet       06/02/2022 10/22/2021  Depression screen PHQ 2/9  Decreased Interest 2 0  Down, Depressed, Hopeless 1 0  PHQ - 2 Score 3 0  Altered sleeping 1 -  Tired, decreased energy 2 -  Change in appetite 1 -  Feeling bad or failure about yourself  1 -  Trouble concentrating 0 -  Moving slowly or fidgety/restless 1 -  Suicidal thoughts 0 -  PHQ-9 Score 9 -  Difficult doing work/chores Very difficult -   Interpretation of Total Score  Total Score Depression Severity:  1-4 = Minimal depression, 5-9 = Mild depression, 10-14 = Moderate depression, 15-19 = Moderately severe depression, 20-27 = Severe depression   Psychosocial Evaluation and Intervention:  Psychosocial Evaluation - 05/25/22 1318       Psychosocial Evaluation & Interventions   Interventions Encouraged to exercise with the program and follow exercise prescription    Comments Mr. Fragale is coming to pulmonary rehab with worsening ILD. He reports that since he started getting sick a few years ago, he hasn't really felt like doing much. He finds himself lying around more and getting more winded when he does get up to do things. He used to work in a [employer] and enjoy exercising. He wants to get back some of the strength he has lost. He knows it will be a journey getting back to a more active lifestyle and is wary of his breathing's reaction, but he wants to put in the work. He enjoys playing video games for fun and has a good support system.     Expected Outcomes Short: attend pulmonary rehab for education and exercise. Long: develop and maintain positive self care habits.    Continue Psychosocial Services  Follow up required by staff             Psychosocial Re-Evaluation:   Psychosocial Discharge (Final Psychosocial Re-Evaluation):   Education: Education Goals: Education classes will be provided on a weekly basis, covering required topics. Participant will state understanding/return demonstration of topics presented.  Learning Barriers/Preferences:   General Pulmonary Education Topics:  Infection Prevention: - Provides verbal and written material to individual with discussion of infection control including proper hand washing and proper equipment cleaning during exercise session. Flowsheet Row Pulmonary Rehab from 06/02/2022 in [facility] Cardiac and Pulmonary Rehab  Date 06/02/22  Educator [facility]  Instruction Review Code 1- Verbalizes Understanding       Falls Prevention: - Provides verbal and written material to individual with discussion of falls prevention and safety. Flowsheet Row Pulmonary Rehab from 06/02/2022 in [facility]  Date 06/02/22  Educator [facility]  Instruction Review Code 1- Verbalizes Understanding       Chronic Lung Disease Review: - Group verbal instruction with posters, models, PowerPoint presentations and videos,  to review new updates, new respiratory medications, new advancements in procedures and treatments. Providing information on websites and "800" numbers for continued self-education. Includes information about supplement oxygen, available portable oxygen systems, continuous and intermittent flow rates, oxygen safety, concentrators, and Medicare reimbursement for oxygen. Explanation of Pulmonary Drugs, including class, frequency, complications, importance of spacers, rinsing mouth after steroid MDI's, and proper cleaning methods for nebulizers. Review of basic lung anatomy and  physiology related to function, structure, and complications of lung disease. Review of risk factors. Discussion about methods for diagnosing sleep apnea and types of masks and machines for OSA. Includes a review of the use of types of environmental controls: home humidity, furnaces, filters, dust mite/pet prevention, HEPA vacuums. Discussion about weather changes, air quality and the benefits of nasal washing. Instruction on Warning signs, infection symptoms, calling MD promptly, preventive modes, and value of vaccinations. Review of effective airway clearance, coughing and/or vibration techniques. Emphasizing that all should Create an Action Plan. Written  material given at graduation.   AED/CPR: - Group verbal and written instruction with the use of models to demonstrate the basic use of the AED with the basic ABC's of resuscitation.    Anatomy and Cardiac Procedures: - Group verbal and visual presentation and models provide information about basic cardiac anatomy and function. Reviews the testing methods done to diagnose heart disease and the outcomes of the test results. Describes the treatment choices: Medical Management, Angioplasty, or Coronary Bypass Surgery for treating various heart conditions including Myocardial Infarction, Angina, Valve Disease, and Cardiac Arrhythmias.  Written material given at graduation.   Medication Safety: - Group verbal and visual instruction to review commonly prescribed medications for heart and lung disease. Reviews the medication, class of the drug, and side effects. Includes the steps to properly store meds and maintain the prescription regimen.  Written material given at graduation.   Other: -Provides group and verbal instruction on various topics (see comments)   Knowledge Questionnaire Score:    Core Components/Risk Factors/Patient Goals at Admission:  Personal Goals and Risk Factors at Admission - 06/02/22 1135       Core Components/Risk  Factors/Patient Goals on Admission    Weight Management Yes;Weight Gain    Intervention Weight Management: Develop a combined nutrition and exercise program designed to reach desired caloric intake, while maintaining appropriate intake of nutrient and fiber, sodium and fats, and appropriate energy expenditure required for the weight goal.;Weight Management: Provide education and appropriate resources to help participant work on and attain dietary goals.    Admit Weight 153 lb 8 oz (69.6 kg)    Goal Weight: Short Term 160 lb (72.6 kg)    Goal Weight: Long Term 165 lb (74.8 kg)    Expected Outcomes Long Term: Adherence to nutrition and physical activity/exercise program aimed toward attainment of established weight goal;Short Term: Continue to assess and modify interventions until short term weight is achieved;Weight Gain: Understanding of general recommendations for a high calorie, high protein meal plan that promotes weight gain by distributing calorie intake throughout the day with the consumption for 4-5 meals, snacks, and/or supplements;Weight Maintenance: Understanding of the daily nutrition guidelines, which includes 25-35% calories from fat, 7% or less cal from saturated fats, less than 256m cholesterol, less than 1.5gm of sodium, & 5 or more servings of fruits and vegetables daily    Improve shortness of breath with ADL's Yes    Intervention Provide education, individualized exercise plan and daily activity instruction to help decrease symptoms of SOB with activities of daily living.    Expected Outcomes Short Term: Improve cardiorespiratory fitness to achieve a reduction of symptoms when performing ADLs;Long Term: Be able to perform more ADLs without symptoms or delay the onset of symptoms    Increase knowledge of respiratory medications and ability to use respiratory devices properly  Yes    Intervention Provide education and demonstration as needed of appropriate use of medications, inhalers,  and oxygen therapy.    Expected Outcomes Short Term: Achieves understanding of medications use. Understands that oxygen is a medication prescribed by physician. Demonstrates appropriate use of inhaler and oxygen therapy.;Long Term: Maintain appropriate use of medications, inhalers, and oxygen therapy.    Diabetes Yes    Intervention Provide education about signs/symptoms and action to take for hypo/hyperglycemia.;Provide education about proper nutrition, including hydration, and aerobic/resistive exercise prescription along with prescribed medications to achieve blood glucose in normal ranges: Fasting glucose 65-99 mg/dL    Expected Outcomes Short Term: Participant verbalizes understanding of  the signs/symptoms and immediate care of hyper/hypoglycemia, proper foot care and importance of medication, aerobic/resistive exercise and nutrition plan for blood glucose control.;Long Term: Attainment of HbA1C < 7%.    Hypertension Yes    Intervention Provide education on lifestyle modifcations including regular physical activity/exercise, weight management, moderate sodium restriction and increased consumption of fresh fruit, vegetables, and low fat dairy, alcohol moderation, and smoking cessation.;Monitor prescription use compliance.    Expected Outcomes Short Term: Continued assessment and intervention until BP is < 140/61m HG in hypertensive participants. < 130/810mHG in hypertensive participants with diabetes, heart failure or chronic kidney disease.;Long Term: Maintenance of blood pressure at goal levels.             Education:Diabetes - Individual verbal and written instruction to review signs/symptoms of diabetes, desired ranges of glucose level fasting, after meals and with exercise. Acknowledge that pre and post exercise glucose checks will be done for 3 sessions at entry of program. [facility]  Date 05/25/22  [facility] Instruction Review Code 1- Verbalizes Understanding       Know Your Numbers and Heart Failure: - Group verbal and visual instruction to discuss disease risk factors for cardiac and pulmonary disease and treatment options.  Reviews associated critical values for Overweight/Obesity, Hypertension, Cholesterol, and Diabetes.  Discusses basics of heart failure: signs/symptoms and treatments.  Introduces Heart Failure Zone chart for action plan for heart failure.  Written material given at graduation.   Core Components/Risk Factors/Patient Goals Review:    Core Components/Risk Factors/Patient Goals at Discharge (Final Review):    ITP Comments:  ITP Comments     Row Name 05/25/22 1323 06/02/22 1119         ITP Comments Initial phone call completed. Diagnosis can be found in [facility] 2/5. EP Orientation scheduled for Wednesday, 2/21 at 2:30. Completed 6MWT and gym orientation. Initial ITP created and sent for review to Dr. FaZetta BillsMedical Director.  He will return his paperwork on his first day.               Comments: Initial ITP

## 2022-06-03 ENCOUNTER — Encounter: Payer: Medicaid Other | Admitting: *Deleted

## 2022-06-03 ENCOUNTER — Other Ambulatory Visit
Admission: RE | Admit: 2022-06-03 | Discharge: 2022-06-03 | Disposition: A | Discharge status: Home / Self Care | Payer: Medicaid Other | Source: Home / Self Care | Attending: Internal Medicine | Admitting: Internal Medicine

## 2022-06-03 DIAGNOSIS — R945 Abnormal results of liver function studies: Secondary | ICD-10-CM | POA: Insufficient documentation

## 2022-06-03 DIAGNOSIS — J849 Interstitial pulmonary disease, unspecified: Secondary | ICD-10-CM

## 2022-06-03 DIAGNOSIS — D509 Iron deficiency anemia, unspecified: Secondary | ICD-10-CM | POA: Insufficient documentation

## 2022-06-03 DIAGNOSIS — R739 Hyperglycemia, unspecified: Secondary | ICD-10-CM | POA: Insufficient documentation

## 2022-06-03 DIAGNOSIS — E785 Hyperlipidemia, unspecified: Secondary | ICD-10-CM | POA: Insufficient documentation

## 2022-06-03 LAB — BASIC METABOLIC PANEL
Anion gap: 7 (ref 5–15)
BUN: 12 mg/dL (ref 6–20)
CO2: 26 mmol/L (ref 22–32)
Calcium: 8.8 mg/dL — ABNORMAL LOW (ref 8.9–10.3)
Chloride: 103 mmol/L (ref 98–111)
Creatinine, Ser: 0.75 mg/dL (ref 0.61–1.24)
GFR, Estimated: >60 mL/min (ref >60)
Glucose, Bld: 74 mg/dL (ref 70–99)
Potassium: 3.9 mmol/L (ref 3.5–5.1)
Sodium: 136 mmol/L (ref 135–145)

## 2022-06-03 LAB — HEMOGLOBIN A1C
Hgb A1c MFr Bld: 5.5 % (ref 4.8–5.6)
Mean Plasma Glucose: 111.15 mg/dL

## 2022-06-03 LAB — CBC WITH DIFFERENTIAL/PLATELET
Abs Immature Granulocytes: 0.03 10*3/uL (ref 0.00–0.07)
Basophils Absolute: 0 10*3/uL (ref 0.0–0.1)
Basophils Relative: 0 %
Eosinophils Absolute: 0.1 10*3/uL (ref 0.0–0.5)
Eosinophils Relative: 1 %
HCT: 31.5 % — ABNORMAL LOW (ref 39.0–52.0)
Hemoglobin: 9.8 g/dL — ABNORMAL LOW (ref 13.0–17.0)
Immature Granulocytes: 1 %
Lymphocytes Relative: 12 %
Lymphs Abs: 0.8 10*3/uL (ref 0.7–4.0)
MCH: 29 pg (ref 26.0–34.0)
MCHC: 31.1 g/dL (ref 30.0–36.0)
MCV: 93.2 fL (ref 80.0–100.0)
Monocytes Absolute: 0.8 10*3/uL (ref 0.1–1.0)
Monocytes Relative: 12 %
Neutro Abs: 5 10*3/uL (ref 1.7–7.7)
Neutrophils Relative %: 74 %
Platelets: 443 10*3/uL — ABNORMAL HIGH (ref 150–400)
RBC: 3.38 MIL/uL — ABNORMAL LOW (ref 4.22–5.81)
RDW: 14 % (ref 11.5–15.5)
WBC: 6.6 10*3/uL (ref 4.0–10.5)
nRBC: 0 % (ref 0.0–0.2)

## 2022-06-03 LAB — HEPATIC FUNCTION PANEL
ALT: 13 U/L (ref 0–44)
AST: 16 U/L (ref 15–41)
Albumin: 2.7 g/dL — ABNORMAL LOW (ref 3.5–5.0)
Alkaline Phosphatase: 41 U/L (ref 38–126)
Bilirubin, Direct: <0.1 mg/dL (ref 0.0–0.2)
Total Bilirubin: 0.4 mg/dL (ref 0.3–1.2)
Total Protein: 8 g/dL (ref 6.5–8.1)

## 2022-06-03 LAB — GLUCOSE, CAPILLARY
Glucose-Capillary: 50 mg/dL — ABNORMAL LOW (ref 70–99)
Glucose-Capillary: 114 mg/dL — ABNORMAL HIGH (ref 70–99)
Glucose-Capillary: 77 mg/dL (ref 70–99)

## 2022-06-03 LAB — LIPID PANEL
Cholesterol: 116 mg/dL (ref 0–200)
HDL: 35 mg/dL — ABNORMAL LOW (ref >40)
LDL Cholesterol: 64 mg/dL (ref 0–99)
Total CHOL/HDL Ratio: 3.3 RATIO
Triglycerides: 85 mg/dL (ref <150)
VLDL: 17 mg/dL (ref 0–40)

## 2022-06-03 NOTE — Progress Notes (Signed)
Daily Session Note  Patient Details  Name: Sean [name] MRN: [id number] Date of Birth: March 31, 1976 Referring Provider:   Flowsheet Row Pulmonary Rehab from 06/02/2022 in [facility] Cardiac and Pulmonary Rehab  Referring Provider Vonita Moss MD       Encounter Date: 06/03/2022  Check In:  Session Check In - 06/03/22 1121       Check-In   Supervising physician immediately available to respond to emergencies See telemetry face sheet for immediately available ER MD    Location [facility]-Cardiac & Pulmonary Rehab    Staff Present Darlyne Russian, RN, ADN;Jessica Luan Pulling, MA, RCEP, CCRP, Bertram Gala, MS, ACSM CEP, Exercise Physiologist    Virtual Visit No    Medication changes reported     No    Fall or balance concerns reported    No    Warm-up and Cool-down Performed on first and last piece of equipment    Resistance Training Performed Yes    VAD Patient? No    PAD/SET Patient? No      Pain Assessment   Currently in Pain? No/denies                Social History   Tobacco Use  Smoking Status Former   Types: Cigarettes  Smokeless Tobacco Never    Goals Met:  Independence with exercise equipment Exercise tolerated well No report of concerns or symptoms today Strength training completed today  Goals Unmet:  Not Applicable  Comments: First full day of exercise!  Patient was oriented to gym and equipment including functions, settings, policies, and procedures.  Patient's individual exercise prescription and treatment plan were reviewed.  All starting workloads were established based on the results of the 6 minute walk test done at initial orientation visit.  The plan for exercise progression was also introduced and progression will be customized based on patient's performance and goals.    Dr. Emily Filbert is Medical Director for [place].  Dr. Ottie Glazier is Medical Director for [facility] Pulmonary Rehabilitation.

## 2022-06-08 ENCOUNTER — Encounter: Payer: Medicaid Other | Admitting: *Deleted

## 2022-06-08 DIAGNOSIS — J849 Interstitial pulmonary disease, unspecified: Secondary | ICD-10-CM | POA: Diagnosis not present

## 2022-06-08 NOTE — Progress Notes (Signed)
Daily Session Note  Patient Details  Name: Sean [name] MRN: [id number] Date of Birth: 1975/05/17 Referring Provider:   Flowsheet Row Pulmonary Rehab from 06/02/2022 in [facility] Cardiac and Pulmonary Rehab  Referring Provider Vonita Moss MD       Encounter Date: 06/08/2022  Check In:  Session Check In - 06/08/22 0926       Check-In   Supervising physician immediately available to respond to emergencies See telemetry face sheet for immediately available ER MD    Location [facility]-Cardiac & Pulmonary Rehab    Staff Present Renita Papa, RN BSN;Jessica Luan Pulling, MA, RCEP, CCRP, Bertram Gala, MS, ACSM CEP, Exercise Physiologist    Virtual Visit No    Medication changes reported     No    Fall or balance concerns reported    No    Warm-up and Cool-down Performed on first and last piece of equipment    Resistance Training Performed Yes    VAD Patient? No    PAD/SET Patient? No      Pain Assessment   Currently in Pain? No/denies                Social History   Tobacco Use  Smoking Status Former   Types: Cigarettes  Smokeless Tobacco Never    Goals Met:  Independence with exercise equipment Exercise tolerated well No report of concerns or symptoms today Strength training completed today  Goals Unmet:  Not Applicable  Comments: Pt able to follow exercise prescription today without complaint.  Will continue to monitor for progression.    Dr. Emily Filbert is Medical Director for [place].  Dr. Ottie Glazier is Medical Director for [facility] Pulmonary Rehabilitation.

## 2022-06-10 ENCOUNTER — Encounter: Payer: Medicaid Other | Admitting: *Deleted

## 2022-06-10 DIAGNOSIS — J849 Interstitial pulmonary disease, unspecified: Secondary | ICD-10-CM | POA: Diagnosis not present

## 2022-06-10 NOTE — Progress Notes (Signed)
Daily Session Note  Patient Details  Name: Sean [name] MRN: [id number] Date of Birth: [birth year] Referring Provider:   Flowsheet Row Pulmonary Rehab from 06/02/2022 in [facility] Cardiac and Pulmonary Rehab  Referring Provider Vonita Moss MD       Encounter Date: 06/10/2022  Check In:  Session Check In - 06/10/22 0957       Check-In   Supervising physician immediately available to respond to emergencies See telemetry face sheet for immediately available ER MD    Location [facility]-Cardiac & Pulmonary Rehab    Staff Present Lamar Blinks, BS, RRT, CPFT;Megan Tamala Julian, RN, ADN;Jessica Luan Pulling, MA, RCEP, CCRP, Bertram Gala, MS, ACSM CEP, Exercise Physiologist   Darel Hong RN BSN   Virtual Visit No    Medication changes reported     No    Fall or balance concerns reported    No    Tobacco Cessation No Change    Warm-up and Cool-down Performed on first and last piece of equipment    Resistance Training Performed Yes    VAD Patient? No    PAD/SET Patient? No      Pain Assessment   Currently in Pain? No/denies                Social History   Tobacco Use  Smoking Status Former   Types: Cigarettes  Smokeless Tobacco Never    Goals Met:  Independence with exercise equipment Exercise tolerated well No report of concerns or symptoms today Strength training completed today  Goals Unmet:  Not Applicable  Comments: Pt able to follow exercise prescription today without complaint.  Will continue to monitor for progression.    Dr. Emily Filbert is Medical Director for [place].  Dr. Ottie Glazier is Medical Director for [facility] Pulmonary Rehabilitation.

## 2022-06-17 ENCOUNTER — Encounter: Payer: Medicaid Other | Attending: Critical Care Medicine | Admitting: *Deleted

## 2022-06-17 DIAGNOSIS — J849 Interstitial pulmonary disease, unspecified: Secondary | ICD-10-CM | POA: Diagnosis present

## 2022-06-17 NOTE — Progress Notes (Signed)
Daily Session Note  Patient Details  Name: Sean [name] MRN: [id number] Date of Birth: 12-Jul-1975 Referring Provider:   Flowsheet Row Pulmonary Rehab from 06/02/2022 in [facility] Cardiac and Pulmonary Rehab  Referring Provider Vonita Moss MD       Encounter Date: 06/17/2022  Check In:  Session Check In - 06/17/22 0936       Check-In   Supervising physician immediately available to respond to emergencies See telemetry face sheet for immediately available ER MD    Location [facility]-Cardiac & Pulmonary Rehab    Staff Present Darlyne Russian, RN, ADN;Jessica Luan Pulling, MA, RCEP, CCRP, Bertram Gala, MS, ACSM CEP, Exercise Physiologist    Virtual Visit No    Medication changes reported     No    Fall or balance concerns reported    No    Warm-up and Cool-down Performed on first and last piece of equipment    Resistance Training Performed Yes    VAD Patient? No    PAD/SET Patient? No      Pain Assessment   Currently in Pain? No/denies                Social History   Tobacco Use  Smoking Status Former   Types: Cigarettes  Smokeless Tobacco Never    Goals Met:  Independence with exercise equipment Exercise tolerated well No report of concerns or symptoms today Strength training completed today  Goals Unmet:  Not Applicable  Comments: Pt able to follow exercise prescription today without complaint.  Will continue to monitor for progression.    Dr. Emily Filbert is Medical Director for [place].  Dr. Ottie Glazier is Medical Director for [facility] Pulmonary Rehabilitation.

## 2022-06-22 ENCOUNTER — Telehealth: Payer: Self-pay

## 2022-06-22 NOTE — Telephone Encounter (Signed)
Patient had a GI procedure with [name] on 04/29/2022 and has a follow up appointment with [place] on 07/13/2022. He has a appointment with Dr. Marius Ditch on 06/24/2022 and we need to find out why patient is transferring care to our office and get Dr. Marius Ditch approval for the transfer.

## 2022-06-22 NOTE — Telephone Encounter (Signed)
Spoke with pt he decided to keep his GI care with [facility]

## 2022-06-24 ENCOUNTER — Encounter: Payer: Medicaid Other | Admitting: *Deleted

## 2022-06-24 ENCOUNTER — Ambulatory Visit: Payer: Medicaid Other | Admitting: Gastroenterology

## 2022-06-24 DIAGNOSIS — J849 Interstitial pulmonary disease, unspecified: Secondary | ICD-10-CM

## 2022-06-24 NOTE — Progress Notes (Signed)
Daily Session Note  Patient Details  Name: Sean [name] MRN: [id number] Date of Birth: 04-05-1976 Referring Provider:   Flowsheet Row Pulmonary Rehab from 06/02/2022 in [facility] Cardiac and Pulmonary Rehab  Referring Provider Vonita Moss MD       Encounter Date: 06/24/2022  Check In:  Session Check In - 06/24/22 0930       Check-In   Supervising physician immediately available to respond to emergencies See telemetry face sheet for immediately available ER MD    Location [facility]-Cardiac & Pulmonary Rehab    Staff Present Darlyne Russian, RN, Lorin Mercy, MS, ACSM CEP, Exercise Physiologist;Joseph Tessie Fass, [place]    Virtual Visit No    Medication changes reported     No    Fall or balance concerns reported    No    Warm-up and Cool-down Performed on first and last piece of equipment    Resistance Training Performed Yes    VAD Patient? No    PAD/SET Patient? No      Pain Assessment   Currently in Pain? No/denies                Social History   Tobacco Use  Smoking Status Former   Types: Cigarettes  Smokeless Tobacco Never    Goals Met:  Independence with exercise equipment Exercise tolerated well No report of concerns or symptoms today Strength training completed today  Goals Unmet:  Not Applicable  Comments: Pt able to follow exercise prescription today without complaint.  Will continue to monitor for progression.    Dr. Emily Filbert is Medical Director for [place].  Dr. Ottie Glazier is Medical Director for [facility] Pulmonary Rehabilitation.

## 2022-06-28 DIAGNOSIS — J849 Interstitial pulmonary disease, unspecified: Secondary | ICD-10-CM

## 2022-06-28 NOTE — Progress Notes (Signed)
Completed Initial RD Consultation °

## 2022-06-29 ENCOUNTER — Encounter: Payer: Medicaid Other | Admitting: *Deleted

## 2022-06-29 DIAGNOSIS — J849 Interstitial pulmonary disease, unspecified: Secondary | ICD-10-CM

## 2022-06-29 NOTE — Progress Notes (Signed)
Daily Session Note  Patient Details  Name: Sean [name] MRN: [id number] Date of Birth: 1975/11/11 Referring Provider:   Flowsheet Row Pulmonary Rehab from 06/02/2022 in [facility] Cardiac and Pulmonary Rehab  Referring Provider Vonita Moss MD       Encounter Date: 06/29/2022  Check In:  Session Check In - 06/29/22 1039       Check-In   Supervising physician immediately available to respond to emergencies See telemetry face sheet for immediately available ER MD    Location [facility]-Cardiac & Pulmonary Rehab    Staff Present Heath Lark, RN, BSN, CCRP;Jessica Denton, MA, RCEP, CCRP, Bertram Gala, MS, ACSM CEP, Exercise Physiologist    Virtual Visit No    Medication changes reported     No    Fall or balance concerns reported    No    Warm-up and Cool-down Performed on first and last piece of equipment    Resistance Training Performed Yes    VAD Patient? No    PAD/SET Patient? No      Pain Assessment   Currently in Pain? No/denies                Social History   Tobacco Use  Smoking Status Former   Types: Cigarettes  Smokeless Tobacco Never    Goals Met:  Proper associated with RPD/PD & O2 Sat Independence with exercise equipment Exercise tolerated well No report of concerns or symptoms today  Goals Unmet:  Not Applicable  Comments: Pt able to follow exercise prescription today without complaint.  Will continue to monitor for progression.    Dr. Emily Filbert is Medical Director for [place].  Dr. Ottie Glazier is Medical Director for [facility] Pulmonary Rehabilitation.

## 2022-06-30 ENCOUNTER — Encounter: Payer: Self-pay | Admitting: *Deleted

## 2022-06-30 DIAGNOSIS — J849 Interstitial pulmonary disease, unspecified: Secondary | ICD-10-CM

## 2022-06-30 NOTE — Progress Notes (Signed)
Pulmonary Individual Treatment Plan  Patient Details  Name: Sean [name] MRN: [id number] Date of Birth: 10/02/1975 Referring Provider:   Flowsheet Row Pulmonary Rehab from 06/02/2022 in [facility] Cardiac and Pulmonary Rehab  Referring Provider Vonita Moss MD       Initial Encounter Date:  Flowsheet Row Pulmonary Rehab from 06/02/2022 in [facility] Cardiac and Pulmonary Rehab  Date 06/02/22       Visit Diagnosis: ILD (interstitial lung disease) ([place])  Patient's Home Medications on Admission:  Current Outpatient Medications:    acetaminophen (TYLENOL) 500 MG tablet, Take by mouth., Disp: , Rfl:    albuterol (VENTOLIN HFA) 108 (90 Base) MCG/ACT inhaler, Inhale 2 puffs into the lungs every 6 (six) hours as needed for wheezing or shortness of breath., Disp: 8 g, Rfl: 2   blood glucose meter kit and supplies KIT, Dispense based on patient and insurance preference. Use up to four times daily as directed., Disp: 1 each, Rfl: 0   blood glucose meter kit and supplies, Dispense based on patient and insurance preference. Use up to four times daily as directed. (FOR ICD-10 E10.9, E11.9)., Disp: 1 each, Rfl: 0   Blood Glucose Monitoring Suppl (CONTOUR NEXT ONE) KIT, 4 (four) times daily., Disp: , Rfl:    BREZTRI AEROSPHERE 160-9-4.8 MCG/ACT AERO, Inhale 2 puffs into the lungs 2 (two) times daily., Disp: 10.7 g, Rfl: 0   chlorhexidine (PERIDEX) 0.12 % solution, 15 mLs 2 (two) times daily. (Patient not taking: Reported on 05/25/2022), Disp: , Rfl:    Cholecalciferol (VITAMIN D-1000 MAX ST) 25 MCG (1000 UT) tablet, Take by mouth., Disp: , Rfl:    clobetasol ointment (TEMOVATE) 0.05 %, Apply topically., Disp: , Rfl:    furosemide (LASIX) 20 MG tablet, Take 1 tablet (20 mg total) by mouth daily., Disp: 30 tablet, Rfl: 0   hydroxychloroquine (PLAQUENIL) 200 MG tablet, Place 1 tablet (200 mg total) into feeding tube 2 (two) times daily. (Patient not taking: Reported on 05/25/2022), Disp: , Rfl:    insulin aspart  (NOVOLOG) 100 UNIT/ML FlexPen, Inject 1-9 Units into the skin every 6 (six) hours. CBG 70 - 120: 0 units CBG 121 - 150: 1 unit CBG 151 - 200: 2 units CBG 201 - 250: 3 units CBG 251 - 300: 5 units CBG 301 - 350: 7 units CBG 351 - 400: 9 units CBG > 400: call MD and obtain STAT lab verification, Disp: 15 mL, Rfl: 11   Insulin Pen Needle (PEN NEEDLES 3/16") 31G X 5 MM MISC, 1 pen. by Does not apply route every 6 (six) hours., Disp: 200 each, Rfl: 1   lidocaine (XYLOCAINE) 2 % solution, Use as directed 15 mLs in the mouth or throat every 6 (six) hours as needed (mouth/throat pain). (Patient not taking: Reported on 05/25/2022), Disp: 100 mL, Rfl: 0   meloxicam (MOBIC) 15 MG tablet, Take by mouth., Disp: , Rfl:    Multiple Vitamins-Minerals (SUPER THERA VITE M) TABS, Take 1 tablet by mouth daily., Disp: , Rfl:    NONFORMULARY OR COMPOUNDED ITEM, LIDOCAINE/MAALOX XS 1:1 (Patient not taking: Reported on 05/25/2022), Disp: , Rfl:    pantoprazole (PROTONIX) 40 MG tablet, Take 1 tablet (40 mg total) by mouth daily. (Patient not taking: Reported on 05/25/2022), Disp: 30 tablet, Rfl: 0   sulfamethoxazole-trimethoprim (BACTRIM) 400-80 MG tablet, Take by mouth., Disp: , Rfl:    tacrolimus (PROTOPIC) 0.1 % ointment, Apply topically 2 (two) times daily., Disp: , Rfl:    thiamine (VITAMIN B1) 100  MG tablet, Take by mouth., Disp: , Rfl:    traMADol (ULTRAM) 50 MG tablet, Take by mouth., Disp: , Rfl:    triamcinolone cream (KENALOG) 0.1 %, Apply topically 2 (two) times daily., Disp: , Rfl:    Vitamin D 12.5 MCG/0.25ML LIQD, Give 1 mL by tube daily. (Patient not taking: Reported on 05/25/2022), Disp: 36 mL, Rfl: 1  Past Medical History: Past Medical History:  Diagnosis Date   H/O immunosuppressive therapy    Hyperpigmentation of skin    Hyperpigmentation rash/vesicles of the palm and face   Neutropenia (HCC)    RA (rheumatoid arthritis) ([place])     Tobacco Use: Social History   Tobacco Use  Smoking Status Former    Types: Cigarettes  Smokeless Tobacco Never    Labs: Review Flowsheet       Latest Ref Rng & Units 07/26/2021 09/10/2021 01/12/2022 06/03/2022  Labs for [facility] Cardiac and Pulmonary Rehab  Cholestrol 0 - 200 mg/dL - - 176  116   LDL (calc) 0 - 99 mg/dL - - 91  64   HDL-C >40 mg/dL - - 38  35   Trlycerides <150 mg/dL 187  - 234  85   Hemoglobin A1c 4.8 - 5.6 % - 11.9  6.4  5.5      Pulmonary Assessment Scores:  Pulmonary Assessment Scores     Row Name 06/02/22 1137 06/08/22 0938       ADL UCSD   ADL Phase Exit Entry    SOB Score total -- 75    Rest -- 0    Walk -- 2    Stairs -- 5    Bath -- 3    Dress -- 3    Shop -- 4      CAT Score   CAT Score -- 16      mMRC Score   mMRC Score 4 --             UCSD: Self-administered rating of dyspnea associated with activities of daily living (ADLs) 6-point scale (0 = "not at all" to 5 = "maximal or unable to do because of breathlessness")  Scoring Scores range from 0 to 120.  Minimally important difference is 5 units  CAT: CAT can identify the health impairment of COPD patients and is better correlated with disease progression.  CAT has a scoring range of zero to 40. The CAT score is classified into four groups of low (less than 10), medium (10 - 20), high (21-30) and very high (31-40) based on the impact level of disease on health status. A CAT score over 10 suggests significant symptoms.  A worsening CAT score could be explained by an exacerbation, poor medication adherence, poor inhaler technique, or progression of COPD or comorbid conditions.  CAT MCID is 2 points  mMRC: mMRC (Modified Medical Research Council) Dyspnea Scale is used to assess the degree of baseline functional disability in patients of respiratory disease due to dyspnea. No minimal important difference is established. A decrease in score of 1 point or greater is considered a positive change.   Pulmonary Function Assessment:   Exercise Target  Goals: Exercise Program Goal: Individual exercise prescription set using results from initial 6 min walk test and THRR while considering  patient's activity barriers and safety.   Exercise Prescription Goal: Initial exercise prescription builds to 30-45 minutes a day of aerobic activity, 2-3 days per week.  Home exercise guidelines will be given to patient during program as part of exercise  prescription that the participant will acknowledge.  Education: Aerobic Exercise: - Group verbal and visual presentation on the components of exercise prescription. Introduces F.I.T.T principle from ACSM for exercise prescriptions.  Reviews F.I.T.T. principles of aerobic exercise including progression. Written material given at graduation.   Education: Resistance Exercise: - Group verbal and visual presentation on the components of exercise prescription. Introduces F.I.T.T principle from ACSM for exercise prescriptions  Reviews F.I.T.T. principles of resistance exercise including progression. Written material given at graduation.    Education: Exercise & Equipment Safety: - Individual verbal instruction and demonstration of equipment use and safety with use of the equipment. Flowsheet Row Pulmonary Rehab from 06/17/2022 in [facility] Cardiac and Pulmonary Rehab  Date 06/02/22  Educator [facility]  Instruction Review Code 1- Verbalizes Understanding       Education: Exercise Physiology & General Exercise Guidelines: - Group verbal and written instruction with models to review the exercise physiology of the cardiovascular system and associated critical values. Provides general exercise guidelines with specific guidelines to those with heart or lung disease.    Education: Flexibility, Balance, Mind/Body Relaxation: - Group verbal and visual presentation with interactive activity on the components of exercise prescription. Introduces F.I.T.T principle from ACSM for exercise prescriptions. Reviews F.I.T.T. principles of  flexibility and balance exercise training including progression. Also discusses the mind body connection.  Reviews various relaxation techniques to help reduce and manage stress (i.e. Deep breathing, progressive muscle relaxation, and visualization). Balance handout provided to take home. Written material given at graduation.   Activity Barriers & Risk Stratification:  Activity Barriers & Cardiac Risk Stratification - 06/02/22 1127       Activity Barriers & Cardiac Risk Stratification   Activity Barriers Shortness of Breath;Deconditioning;Muscular Weakness             6 Minute Walk:  6 Minute Walk     Row Name 06/02/22 1125         6 Minute Walk   Phase Initial     Distance 1261 feet     Walk Time 6 minutes     # of Rest Breaks 0     MPH 2.39     METS 5.42     RPE 13     Perceived Dyspnea  3     VO2 Peak 18.99     Symptoms Yes (comment)     Comments SOB     Resting HR 102 bpm     Resting BP 104/58     Resting Oxygen Saturation  87 %     Exercise Oxygen Saturation  during 6 min walk 79 %     Max Ex. HR 125 bpm     Max Ex. BP 136/74     2 Minute Post BP 122/66       Interval HR   1 Minute HR 113     2 Minute HR 117     3 Minute HR 122     4 Minute HR 125     5 Minute HR 125     6 Minute HR 125     2 Minute Post HR 107     Interval Heart Rate? Yes       Interval Oxygen   Interval Oxygen? Yes     Baseline Oxygen Saturation % 93 %     1 Minute Oxygen Saturation % 87 %     1 Minute Liters of Oxygen 0 L  Room Air     2 Minute Oxygen Saturation %  86 %     2 Minute Liters of Oxygen 0 L     3 Minute Oxygen Saturation % 81 %     3 Minute Liters of Oxygen 0 L     4 Minute Oxygen Saturation % 80 %     4 Minute Liters of Oxygen 0 L     5 Minute Oxygen Saturation % 80 %     5 Minute Liters of Oxygen 0 L     6 Minute Oxygen Saturation % 81 %  79% at 5:30     6 Minute Liters of Oxygen 0 L     2 Minute Post Oxygen Saturation % 90 %     2 Minute Post Liters of Oxygen  0 L             Oxygen Initial Assessment:  Oxygen Initial Assessment - 06/02/22 1136       Home Oxygen   Home Oxygen Device E-Tanks;Home Concentrator    Sleep Oxygen Prescription None   2L when saturations get low at night as well   Home Exercise Oxygen Prescription None   encouraged to use   Home Resting Oxygen Prescription None    Compliance with Home Oxygen Use Yes      Initial 6 min Walk   Oxygen Used None      Program Oxygen Prescription   Program Oxygen Prescription Continuous;E-Tanks    Liters per minute 2    Comments use 1-2L for exercise to avoid desaturations with exercise      Intervention   Short Term Goals To learn and exhibit compliance with exercise, home and travel O2 prescription;To learn and understand importance of monitoring SPO2 with pulse oximeter and demonstrate accurate use of the pulse oximeter.;To learn and understand importance of maintaining oxygen saturations>88%;To learn and demonstrate proper pursed lip breathing techniques or other breathing techniques. ;To learn and demonstrate proper use of respiratory medications    Long  Term Goals Exhibits compliance with exercise, home  and travel O2 prescription;Verbalizes importance of monitoring SPO2 with pulse oximeter and return demonstration;Maintenance of O2 saturations>88%;Exhibits proper breathing techniques, such as pursed lip breathing or other method taught during program session;Compliance with respiratory medication;Demonstrates proper use of MDI's             Oxygen Re-Evaluation:  Oxygen Re-Evaluation     Row Name 06/03/22 1123 06/17/22 0959           Program Oxygen Prescription   Program Oxygen Prescription -- Continuous;E-Tanks      Liters per minute -- 2      Comments -- use 1-2L for exercise to avoid desaturations with exercise        Home Oxygen   Home Oxygen Device -- E-Tanks;Home Concentrator      Sleep Oxygen Prescription -- None      Home Exercise Oxygen Prescription  -- None      Home Resting Oxygen Prescription -- None      Compliance with Home Oxygen Use -- Yes        Goals/Expected Outcomes   Short Term Goals -- To learn and exhibit compliance with exercise, home and travel O2 prescription;To learn and understand importance of monitoring SPO2 with pulse oximeter and demonstrate accurate use of the pulse oximeter.;To learn and understand importance of maintaining oxygen saturations>88%;To learn and demonstrate proper pursed lip breathing techniques or other breathing techniques. ;To learn and demonstrate proper use of respiratory medications      Long  Term  Goals -- Exhibits compliance with exercise, home  and travel O2 prescription;Verbalizes importance of monitoring SPO2 with pulse oximeter and return demonstration;Maintenance of O2 saturations>88%;Exhibits proper breathing techniques, such as pursed lip breathing or other method taught during program session;Compliance with respiratory medication;Demonstrates proper use of MDI's      Comments Reviewed PLB technique with pt.  Talked about how it works and it's importance in maintaining their exercise saturations. We reviewed PLB techique, and Sean [name] states that he has continued to practice PLB especially during exercise. He also states that he has been monitoring his oxygen saturations and reports that they have been dropping to 89-90% during exercise, but recovers well.      Goals/Expected Outcomes Short: Become more profiecient at using PLB. Long: Become independent at using PLB. Short: Become more profiecient at using PLB. Long: Become independent at using PLB.               Oxygen Discharge (Final Oxygen Re-Evaluation):  Oxygen Re-Evaluation - 06/17/22 0959       Program Oxygen Prescription   Program Oxygen Prescription Continuous;E-Tanks    Liters per minute 2    Comments use 1-2L for exercise to avoid desaturations with exercise      Home Oxygen   Home Oxygen Device E-Tanks;Home Concentrator     Sleep Oxygen Prescription None    Home Exercise Oxygen Prescription None    Home Resting Oxygen Prescription None    Compliance with Home Oxygen Use Yes      Goals/Expected Outcomes   Short Term Goals To learn and exhibit compliance with exercise, home and travel O2 prescription;To learn and understand importance of monitoring SPO2 with pulse oximeter and demonstrate accurate use of the pulse oximeter.;To learn and understand importance of maintaining oxygen saturations>88%;To learn and demonstrate proper pursed lip breathing techniques or other breathing techniques. ;To learn and demonstrate proper use of respiratory medications    Long  Term Goals Exhibits compliance with exercise, home  and travel O2 prescription;Verbalizes importance of monitoring SPO2 with pulse oximeter and return demonstration;Maintenance of O2 saturations>88%;Exhibits proper breathing techniques, such as pursed lip breathing or other method taught during program session;Compliance with respiratory medication;Demonstrates proper use of MDI's    Comments We reviewed PLB techique, and Sean [name] states that he has continued to practice PLB especially during exercise. He also states that he has been monitoring his oxygen saturations and reports that they have been dropping to 89-90% during exercise, but recovers well.    Goals/Expected Outcomes Short: Become more profiecient at using PLB. Long: Become independent at using PLB.             Initial Exercise Prescription:  Initial Exercise Prescription - 06/02/22 1100       Date of Initial Exercise RX and Referring Provider   Date 06/02/22    Referring Provider Vonita Moss MD      Oxygen   Oxygen Continuous    Liters 1-2    Maintain Oxygen Saturation 88% or higher      Treadmill   MPH 2.3    Grade 0.5    Minutes 15    METs 2.92      REL-XR   Level 2    Speed 50    Minutes 15    METs 2.5      T5 Nustep   Level 2    SPM 80    Minutes 15    METs 2.5       Track   Laps 34  Minutes 15    METs 2.85      Prescription Details   Frequency (times per week) 2    Duration Progress to 30 minutes of continuous aerobic without signs/symptoms of physical distress      Intensity   THRR 40-80% of Max Heartrate 132-161    Ratings of Perceived Exertion 11-13    Perceived Dyspnea 0-4      Progression   Progression Continue to progress workloads to maintain intensity without signs/symptoms of physical distress.      Resistance Training   Training Prescription Yes    Weight 5 lb    Reps 10-15             Perform Capillary Blood Glucose checks as needed.  Exercise Prescription Changes:   Exercise Prescription Changes     Row Name 06/02/22 1100 06/07/22 1000 06/21/22 1300         Response to Exercise   Blood Pressure (Admit) 104/58 118/62 124/80     Blood Pressure (Exercise) 136/74 128/68 134/80     Blood Pressure (Exit) 108/60 106/62 108/70     Heart Rate (Admit) 102 bpm 98 bpm 97 bpm     Heart Rate (Exercise) 125 bpm 111 bpm 131 bpm     Heart Rate (Exit) 99 bpm 104 bpm 106 bpm     Oxygen Saturation (Admit) 93 % 94 % 92 %     Oxygen Saturation (Exercise) 79 % 89 % 89 %     Oxygen Saturation (Exit) 95 % 94 % 94 %     Rating of Perceived Exertion (Exercise) 13 13 14      Perceived Dyspnea (Exercise) 3 1 2      Symptoms SOB SOB SOB     Comments walk test results 1st full day of exercise --     Duration -- Progress to 30 minutes of  aerobic without signs/symptoms of physical distress Continue with 30 min of aerobic exercise without signs/symptoms of physical distress.     Intensity -- THRR unchanged THRR unchanged       Progression   Progression -- Continue to progress workloads to maintain intensity without signs/symptoms of physical distress. Continue to progress workloads to maintain intensity without signs/symptoms of physical distress.     Average METs -- 2.85 2.51       Resistance Training   Training Prescription -- Yes Yes      Weight -- 5 lb  bands 5 lb     Reps -- 10-15 10-15       Interval Training   Interval Training -- No No       Oxygen   Oxygen -- Continuous Continuous     Liters -- 1-2 1-2       Treadmill   MPH -- -- 2.3     Grade -- -- 0.5     Minutes -- -- 15     METs -- -- 2.92       REL-XR   Level -- 3 2     Minutes -- 15 15     METs -- 4 3.7       T5 Nustep   Level -- -- 3     Minutes -- -- 15     METs -- -- 1.9       Track   Laps -- 13 30     Minutes -- 15 15     METs -- 1.71 2.63       Oxygen  Maintain Oxygen Saturation -- 88% or higher 88% or higher              Exercise Comments:   Exercise Comments     Row Name 06/03/22 1122           Exercise Comments First full day of exercise!  Patient was oriented to gym and equipment including functions, settings, policies, and procedures.  Patient's individual exercise prescription and treatment plan were reviewed.  All starting workloads were established based on the results of the 6 minute walk test done at initial orientation visit.  The plan for exercise progression was also introduced and progression will be customized based on patient's performance and goals.                Exercise Goals and Review:   Exercise Goals     Row Name 06/02/22 1134             Exercise Goals   Increase Physical Activity Yes       Intervention Provide advice, education, support and counseling about physical activity/exercise needs.;Develop an individualized exercise prescription for aerobic and resistive training based on initial evaluation findings, risk stratification, comorbidities and participant's personal goals.       Expected Outcomes Short Term: Attend rehab on a regular basis to increase amount of physical activity.;Long Term: Exercising regularly at least 3-5 days a week.;Long Term: Add in home exercise to make exercise part of routine and to increase amount of physical activity.       Increase Strength and  Stamina Yes       Intervention Provide advice, education, support and counseling about physical activity/exercise needs.;Develop an individualized exercise prescription for aerobic and resistive training based on initial evaluation findings, risk stratification, comorbidities and participant's personal goals.       Expected Outcomes Short Term: Increase workloads from initial exercise prescription for resistance, speed, and METs.;Short Term: Perform resistance training exercises routinely during rehab and add in resistance training at home;Long Term: Improve cardiorespiratory fitness, muscular endurance and strength as measured by increased METs and functional capacity (6MWT)       Able to understand and use rate of perceived exertion (RPE) scale Yes       Intervention Provide education and explanation on how to use RPE scale       Expected Outcomes Short Term: Able to use RPE daily in rehab to express subjective intensity level;Long Term:  Able to use RPE to guide intensity level when exercising independently       Able to understand and use Dyspnea scale Yes       Intervention Provide education and explanation on how to use Dyspnea scale       Expected Outcomes Short Term: Able to use Dyspnea scale daily in rehab to express subjective sense of shortness of breath during exertion;Long Term: Able to use Dyspnea scale to guide intensity level when exercising independently       Knowledge and understanding of Target Heart Rate Range (THRR) Yes       Intervention Provide education and explanation of THRR including how the numbers were predicted and where they are located for reference       Expected Outcomes Short Term: Able to state/look up THRR;Short Term: Able to use daily as guideline for intensity in rehab;Long Term: Able to use THRR to govern intensity when exercising independently       Able to check pulse independently Yes       Intervention  Provide education and demonstration on how to check pulse  in carotid and radial arteries.;Review the importance of being able to check your own pulse for safety during independent exercise       Expected Outcomes Short Term: Able to explain why pulse checking is important during independent exercise;Long Term: Able to check pulse independently and accurately       Understanding of Exercise Prescription Yes       Intervention Provide education, explanation, and written materials on patient's individual exercise prescription       Expected Outcomes Short Term: Able to explain program exercise prescription;Long Term: Able to explain home exercise prescription to exercise independently                Exercise Goals Re-Evaluation :  Exercise Goals Re-Evaluation     Row Name 06/03/22 1122 06/07/22 1040 06/17/22 0947 06/21/22 1342 06/29/22 1023     Exercise Goal Re-Evaluation   Exercise Goals Review Able to understand and use rate of perceived exertion (RPE) scale;Able to understand and use Dyspnea scale;Knowledge and understanding of Target Heart Rate Range (THRR);Understanding of Exercise Prescription Understanding of Exercise Prescription;Increase Physical Activity;Increase Strength and Stamina Understanding of Exercise Prescription;Increase Physical Activity;Increase Strength and Stamina Understanding of Exercise Prescription;Increase Physical Activity;Increase Strength and Stamina Increase Physical Activity   Comments Reviewed RPE scale, THR and program prescription with pt today.  Pt voiced understanding and was given a copy of goals to take home. Sean [name] did well his first day of rehab. He was able to exercise on level 3 on the XR and was able to walk the track around 15 laps. He uses his oxygen on as needed basis, and used 2L when walking. We will monitor to ensure his oxygen saturations stay above 88% at all times. We will continue to monitor as he progresses in the program. Sean [name] is doing well in rehab. He feels that his breathing and stamina have  improved since he began the program. He has began to improve his workloads and laps on the track. He has been going to the the [facility] for resistance training with handweights. We talked about possibly starting to walk the track some at the [facility] as well. We will continue to monitor his progress in the program. Sean [name] is doing well in the program. He has done well with the XR at level 2 and improved to level 3 on the T5 nustep. He also walked up to 30 laps on the track and has continued to walk at a speed of 2.3 mph with a 0.5% incline on the treadmill. He has still been rating his shortness of breath as a 2 on the dyspnea scale during exercise. We will continue to monitor his progress in the program. Sean [name] was encouraged to start walking down to rehab with his oxygen pushing wheelchair.   Expected Outcomes Short: Use RPE daily to regulate intensity. Long: Follow program prescription in THR. ShorT: Continue to follow initial exercise prescription Long: Build up overall strength and stamina Short: Begin walking at the [facility] along with resistance training. Long: Continue to improve strength and stamina Short: Progressively increase treadmill workload. Long: Continue to improve strength and stamina. Short: Start to walk to rehab Long: Continue to improve stamina            Discharge Exercise Prescription (Final Exercise Prescription Changes):  Exercise Prescription Changes - 06/21/22 1300       Response to Exercise   Blood Pressure (Admit) 124/80    Blood Pressure (  Exercise) 134/80    Blood Pressure (Exit) 108/70    Heart Rate (Admit) 97 bpm    Heart Rate (Exercise) 131 bpm    Heart Rate (Exit) 106 bpm    Oxygen Saturation (Admit) 92 %    Oxygen Saturation (Exercise) 89 %    Oxygen Saturation (Exit) 94 %    Rating of Perceived Exertion (Exercise) 14    Perceived Dyspnea (Exercise) 2    Symptoms SOB    Duration Continue with 30 min of aerobic exercise without signs/symptoms of physical  distress.    Intensity THRR unchanged      Progression   Progression Continue to progress workloads to maintain intensity without signs/symptoms of physical distress.    Average METs 2.51      Resistance Training   Training Prescription Yes    Weight 5 lb    Reps 10-15      Interval Training   Interval Training No      Oxygen   Oxygen Continuous    Liters 1-2      Treadmill   MPH 2.3    Grade 0.5    Minutes 15    METs 2.92      REL-XR   Level 2    Minutes 15    METs 3.7      T5 Nustep   Level 3    Minutes 15    METs 1.9      Track   Laps 30    Minutes 15    METs 2.63      Oxygen   Maintain Oxygen Saturation 88% or higher             Nutrition:  Target Goals: Understanding of nutrition guidelines, daily intake of sodium 1500mg , cholesterol 200mg , calories 30% from fat and 7% or less from saturated fats, daily to have 5 or more servings of fruits and vegetables.  Education: All About Nutrition: -Group instruction provided by verbal, written material, interactive activities, discussions, models, and posters to present general guidelines for heart healthy nutrition including fat, fiber, MyPlate, the role of sodium in heart healthy nutrition, utilization of the nutrition label, and utilization of this knowledge for meal planning. Follow up email sent as well. Written material given at graduation.   Biometrics:  Pre Biometrics - 06/02/22 1134       Pre Biometrics   Height 6' 5.5" (1.969 m)    Weight 153 lb 8 oz (69.6 kg)    Waist Circumference 28 inches    Hip Circumference 32 inches    Waist to Hip Ratio 0.88 %    BMI (Calculated) 17.96    Single Leg Stand 30 seconds              Nutrition Therapy Plan and Nutrition Goals:  Nutrition Therapy & Goals - 06/28/22 1333       Nutrition Therapy   Diet Heart healthy, low Na, high kal/high protein    Protein (specify units) 125g    Fiber 35 grams    Whole Grain Foods 3 servings    Saturated  Fats 16 max. grams    Fruits and Vegetables 8 servings/day    Sodium 2 grams      Personal Nutrition Goals   Nutrition Goal ST: build meals with heart healthy fats, fiber, and protein. Cut down on salt used after cooking. Review paperwork and Increase involvement in meal planning/prepping.  LT: follow MyPlate guidelines, gain weight and muscle mass, limit Na <2g/day  Comments 47 y.o. M admitted to pulmonary rehab for ILD. 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 Reviewed relevant medications: humira,furosemide (pt reports no longer prescribed) , GI Cocktail (RULOX,LIDOCAINE), hydroxychloroquine, mycophenolate, pantoprazole, prednisone, breztri, vit D2 1259mcg. B: grilled cheese sandwich or oatmeal L: big meal- chicken sub or philly cheese steak sub S:granola bar before gym and protein shake after gym D: pizza or meat or chicken with vegetables. Sean [name] reports not cooking or shopping and that usually his girlfriend or mom will cook for him. His mom reports that they bake or grill and use olive oil, she uses mrs dash, but does not add salt while cooking. Sean [name] reports using salt afterwards, but he is not sure how much he adds. He reports going out to eat 3-4x week. Drinks: sweet tea or fruit punch or sometimes soda. He reports that he has never made changes to his diet before. He reports that his weight has been stable - he lost weight (~60lbs over the course of 4-5 months) when he first started getting sick - he feels the medication dulled his appetite and he reports his appetetite has improved. Sean [name] reports using serious mass protein, but onlya half a scoop - this provides 630 calories and 25g of protein; discussed how this also contains many vitamins and minerals and cautioned against exceeding recommended doses. Encouraged to make sure any supplements are third  party tested. Discussed general healthy eating and pulmonary MNT. Discussed high calorie/high protein diet and encouraged eating foods higher in fat to increase calories without increased volume - discussed how most of these fats should ideally come from healthy/nutritionally dense sources; peanut butter, liquid plant oils, avocado, and whole fat greek yogurt. Discussed that he could have another scoop of protein powder (2 scoops total/day) for another snack or sip on it in between meals. Encouraged him to become more involved in the cooking and meal planning/shopping process.      Intervention Plan   Intervention Prescribe, educate and counsel regarding individualized specific dietary modifications aiming towards targeted core components such as weight, hypertension, lipid management, diabetes, heart failure and other comorbidities.    Expected Outcomes Short Term Goal: Understand basic principles of dietary content, such as calories, fat, sodium, cholesterol and nutrients.;Short Term Goal: A plan has been developed with personal nutrition goals set during dietitian appointment.;Long Term Goal: Adherence to prescribed nutrition plan.             Nutrition Assessments:  MEDIFICTS Score Key: ?70 Need to make dietary changes  40-70 Heart Healthy Diet ? 40 Therapeutic Level Cholesterol Diet  Flowsheet Row Pulmonary Rehab from 06/08/2022 in [facility] Cardiac and Pulmonary Rehab  Picture Your Plate Total Score on Admission 52      Picture Your Plate Scores: [id number] Unhealthy dietary pattern with much room for improvement. 41-50 Dietary pattern unlikely to meet recommendations for good health and room for improvement. 51-60 More healthful dietary pattern, with some room for improvement.  >60 Healthy dietary pattern, although there may be some specific behaviors that could be improved.   Nutrition Goals Re-Evaluation:  Nutrition Goals Re-Evaluation     [place] Name 06/17/22 1003             Goals    Comment Sean [name] is looking forward to meeting with the RD and has a meeting scheduled for next week. He is looking forward to talking to the RD about healthy eating patterns to  help him gain weight.       Expected Outcome Short: Meet with RD. Long: Implement dietary patterns discussed with RD.                Nutrition Goals Discharge (Final Nutrition Goals Re-Evaluation):  Nutrition Goals Re-Evaluation - 06/17/22 1003       Goals   Comment Sean [name] is looking forward to meeting with the RD and has a meeting scheduled for next week. He is looking forward to talking to the RD about healthy eating patterns to help him gain weight.    Expected Outcome Short: Meet with RD. Long: Implement dietary patterns discussed with RD.             Psychosocial: Target Goals: Acknowledge presence or absence of significant depression and/or stress, maximize coping skills, provide positive support system. Participant is able to verbalize types and ability to use techniques and skills needed for reducing stress and depression.   Education: Stress, Anxiety, and Depression - Group verbal and visual presentation to define topics covered.  Reviews how body is impacted by stress, anxiety, and depression.  Also discusses healthy ways to reduce stress and to treat/manage anxiety and depression.  Written material given at graduation.   Education: Sleep Hygiene -Provides group verbal and written instruction about how sleep can affect your health.  Define sleep hygiene, discuss sleep cycles and impact of sleep habits. Review good sleep hygiene tips.    Initial Review & Psychosocial Screening:  Initial Psych Review & Screening - 05/25/22 1308       Initial Review   Current issues with Current Stress Concerns    Source of Stress Concerns Unable to perform yard/household activities;Unable to participate in former interests or hobbies      Silt? Yes   mom     Barriers    Psychosocial barriers to participate in program There are no identifiable barriers or psychosocial needs.;The patient should benefit from training in stress management and relaxation.      Screening Interventions   Interventions Encouraged to exercise;Provide feedback about the scores to participant;To provide support and resources with identified psychosocial needs    Expected Outcomes Long Term Goal: Stressors or current issues are controlled or eliminated.;Short Term goal: Utilizing psychosocial counselor, staff and physician to assist with identification of specific Stressors or current issues interfering with healing process. Setting desired goal for each stressor or current issue identified.;Short Term goal: Identification and review with participant of any Quality of Life or Depression concerns found by scoring the questionnaire.;Long Term goal: The participant improves quality of Life and PHQ9 Scores as seen by post scores and/or verbalization of changes             Quality of Life Scores:  Scores of 19 and below usually indicate a poorer quality of life in these areas.  A difference of  2-3 points is a clinically meaningful difference.  A difference of 2-3 points in the total score of the Quality of Life Index has been associated with significant improvement in overall quality of life, self-image, physical symptoms, and general health in studies assessing change in quality of life.  PHQ-9: Review Flowsheet       06/29/2022 06/02/2022 10/22/2021  Depression screen PHQ 2/9  Decreased Interest 1 2 0  Down, Depressed, Hopeless 0 1 0  PHQ - 2 Score 1 3 0  Altered sleeping 1 1 -  Tired, decreased energy 1 2 -  Change  in appetite 0 1 -  Feeling bad or failure about yourself  0 1 -  Trouble concentrating 0 0 -  Moving slowly or fidgety/restless 0 1 -  Suicidal thoughts 0 0 -  PHQ-9 Score 3 9 -  Difficult doing work/chores Somewhat difficult Very difficult -   Interpretation of  Total Score  Total Score Depression Severity:  1-4 = Minimal depression, 5-9 = Mild depression, 10-14 = Moderate depression, 15-19 = Moderately severe depression, 20-27 = Severe depression   Psychosocial Evaluation and Intervention:  Psychosocial Evaluation - 05/25/22 1318       Psychosocial Evaluation & Interventions   Interventions Encouraged to exercise with the program and follow exercise prescription    Comments Sean [name] is coming to pulmonary rehab with worsening ILD. He reports that since he started getting sick a few years ago, he hasn't really felt like doing much. He finds himself lying around more and getting more winded when he does get up to do things. He used to work in a [employer] and enjoy exercising. He wants to get back some of the strength he has lost. He knows it will be a journey getting back to a more active lifestyle and is wary of his breathing's reaction, but he wants to put in the work. He enjoys playing video games for fun and has a good support system.    Expected Outcomes Short: attend pulmonary rehab for education and exercise. Long: develop and maintain positive self care habits.    Continue Psychosocial Services  Follow up required by staff             Psychosocial Re-Evaluation:  Psychosocial Re-Evaluation     [place] Name 06/17/22 0952 06/29/22 1010           Psychosocial Re-Evaluation   Current issues with Current Sleep Concerns;Current Psychotropic Meds Current Psychotropic Meds      Comments Sean [name] denies any current stressors at this time. He reports that his sleep has improved since starting some of his medications, however he does want to become too dependent on the medication to help him sleep. He states that exercise has been a good avenue of stress relief for him. He also reports having a good support system made up by his parents, kids, and girlfriend. [place] PHQ score decreased from a 9 to a 3! Continues ti stay compliant with medications.  Exercise is helping him take his mind off of things. Will continue to monitor.      Expected Outcomes Short: Continue to exercise for mental boost. Long: Maintain positive outlook. Short: Continue exercise for mental health Long: Continue to maintain positive attitude      Interventions Encouraged to attend Pulmonary Rehabilitation for the exercise Encouraged to attend Pulmonary Rehabilitation for the exercise      Continue Psychosocial Services  Follow up required by staff Follow up required by staff               Psychosocial Discharge (Final Psychosocial Re-Evaluation):  Psychosocial Re-Evaluation - 06/29/22 1010       Psychosocial Re-Evaluation   Current issues with Current Psychotropic Meds    Comments Sean [name] PHQ score decreased from a 9 to a 3! Continues ti stay compliant with medications. Exercise is helping him take his mind off of things. Will continue to monitor.    Expected Outcomes Short: Continue exercise for mental health Long: Continue to maintain positive attitude    Interventions Encouraged to attend Pulmonary Rehabilitation for the exercise  Continue Psychosocial Services  Follow up required by staff             Education: Education Goals: Education classes will be provided on a weekly basis, covering required topics. Participant will state understanding/return demonstration of topics presented.  Learning Barriers/Preferences:   General Pulmonary Education Topics:  Infection Prevention: - Provides verbal and written material to individual with discussion of infection control including proper hand washing and proper equipment cleaning during exercise session. Flowsheet Row Pulmonary Rehab from 06/17/2022 in [facility] Cardiac and Pulmonary Rehab  Date 06/02/22  Educator [facility]  Instruction Review Code 1- Verbalizes Understanding       Falls Prevention: - Provides verbal and written material to individual with discussion of falls prevention and  safety. Flowsheet Row Pulmonary Rehab from 06/17/2022 in [facility] Cardiac and Pulmonary Rehab  Date 06/02/22  Educator [facility]  Instruction Review Code 1- Verbalizes Understanding       Chronic Lung Disease Review: - Group verbal instruction with posters, models, PowerPoint presentations and videos,  to review new updates, new respiratory medications, new advancements in procedures and treatments. Providing information on websites and "800" numbers for continued self-education. Includes information about supplement oxygen, available portable oxygen systems, continuous and intermittent flow rates, oxygen safety, concentrators, and Medicare reimbursement for oxygen. Explanation of Pulmonary Drugs, including class, frequency, complications, importance of spacers, rinsing mouth after steroid MDI's, and proper cleaning methods for nebulizers. Review of basic lung anatomy and physiology related to function, structure, and complications of lung disease. Review of risk factors. Discussion about methods for diagnosing sleep apnea and types of masks and machines for OSA. Includes a review of the use of types of environmental controls: home humidity, furnaces, filters, dust mite/pet prevention, HEPA vacuums. Discussion about weather changes, air quality and the benefits of nasal washing. Instruction on Warning signs, infection symptoms, calling MD promptly, preventive modes, and value of vaccinations. Review of effective airway clearance, coughing and/or vibration techniques. Emphasizing that all should Create an Action Plan. Written material given at graduation. Flowsheet Row Pulmonary Rehab from 06/17/2022 in [facility] Cardiac and Pulmonary Rehab  Date 06/17/22  Educator [facility]  Instruction Review Code 1- Verbalizes Understanding       AED/CPR: - Group verbal and written instruction with the use of models to demonstrate the basic use of the AED with the basic ABC's of resuscitation.    Anatomy and Cardiac Procedures: -  Group verbal and visual presentation and models provide information about basic cardiac anatomy and function. Reviews the testing methods done to diagnose heart disease and the outcomes of the test results. Describes the treatment choices: Medical Management, Angioplasty, or Coronary Bypass Surgery for treating various heart conditions including Myocardial Infarction, Angina, Valve Disease, and Cardiac Arrhythmias.  Written material given at graduation.   Medication Safety: - Group verbal and visual instruction to review commonly prescribed medications for heart and lung disease. Reviews the medication, class of the drug, and side effects. Includes the steps to properly store meds and maintain the prescription regimen.  Written material given at graduation.   Other: -Provides group and verbal instruction on various topics (see comments)   Knowledge Questionnaire Score:  Knowledge Questionnaire Score - 06/08/22 0939       Knowledge Questionnaire Score   Pre Score 13/16              Core Components/Risk Factors/Patient Goals at Admission:  Personal Goals and Risk Factors at Admission - 06/02/22 1135       Core Components/Risk  Factors/Patient Goals on Admission    Weight Management Yes;Weight Gain    Intervention Weight Management: Develop a combined nutrition and exercise program designed to reach desired caloric intake, while maintaining appropriate intake of nutrient and fiber, sodium and fats, and appropriate energy expenditure required for the weight goal.;Weight Management: Provide education and appropriate resources to help participant work on and attain dietary goals.    Admit Weight 153 lb 8 oz (69.6 kg)    Goal Weight: Short Term 160 lb (72.6 kg)    Goal Weight: Long Term 165 lb (74.8 kg)    Expected Outcomes Long Term: Adherence to nutrition and physical activity/exercise program aimed toward attainment of established weight goal;Short Term: Continue to assess and modify  interventions until short term weight is achieved;Weight Gain: Understanding of general recommendations for a high calorie, high protein meal plan that promotes weight gain by distributing calorie intake throughout the day with the consumption for 4-5 meals, snacks, and/or supplements;Weight Maintenance: Understanding of the daily nutrition guidelines, which includes 25-35% calories from fat, 7% or less cal from saturated fats, less than 200mg  cholesterol, less than 1.5gm of sodium, & 5 or more servings of fruits and vegetables daily    Improve shortness of breath with ADL's Yes    Intervention Provide education, individualized exercise plan and daily activity instruction to help decrease symptoms of SOB with activities of daily living.    Expected Outcomes Short Term: Improve cardiorespiratory fitness to achieve a reduction of symptoms when performing ADLs;Long Term: Be able to perform more ADLs without symptoms or delay the onset of symptoms    Increase knowledge of respiratory medications and ability to use respiratory devices properly  Yes    Intervention Provide education and demonstration as needed of appropriate use of medications, inhalers, and oxygen therapy.    Expected Outcomes Short Term: Achieves understanding of medications use. Understands that oxygen is a medication prescribed by physician. Demonstrates appropriate use of inhaler and oxygen therapy.;Long Term: Maintain appropriate use of medications, inhalers, and oxygen therapy.    Diabetes Yes    Intervention Provide education about signs/symptoms and action to take for hypo/hyperglycemia.;Provide education about proper nutrition, including hydration, and aerobic/resistive exercise prescription along with prescribed medications to achieve blood glucose in normal ranges: Fasting glucose 65-99 mg/dL    Expected Outcomes Short Term: Participant verbalizes understanding of the signs/symptoms and immediate care of hyper/hypoglycemia, proper  foot care and importance of medication, aerobic/resistive exercise and nutrition plan for blood glucose control.;Long Term: Attainment of HbA1C < 7%.    Hypertension Yes    Intervention Provide education on lifestyle modifcations including regular physical activity/exercise, weight management, moderate sodium restriction and increased consumption of fresh fruit, vegetables, and low fat dairy, alcohol moderation, and smoking cessation.;Monitor prescription use compliance.    Expected Outcomes Short Term: Continued assessment and intervention until BP is < 140/4mm HG in hypertensive participants. < 130/16mm HG in hypertensive participants with diabetes, heart failure or chronic kidney disease.;Long Term: Maintenance of blood pressure at goal levels.             Education:Diabetes - Individual verbal and written instruction to review signs/symptoms of diabetes, desired ranges of glucose level fasting, after meals and with exercise. Acknowledge that pre and post exercise glucose checks will be done for 3 sessions at entry of program. Flowsheet Row Pulmonary Rehab from 06/17/2022 in [facility] Cardiac and Pulmonary Rehab  Date 05/25/22  Educator [facility]  Instruction Review Code 1- [employer]  Know Your Numbers and Heart Failure: - Group verbal and visual instruction to discuss disease risk factors for cardiac and pulmonary disease and treatment options.  Reviews associated critical values for Overweight/Obesity, Hypertension, Cholesterol, and Diabetes.  Discusses basics of heart failure: signs/symptoms and treatments.  Introduces Heart Failure Zone chart for action plan for heart failure.  Written material given at graduation. Flowsheet Row Pulmonary Rehab from 06/17/2022 in [facility] Cardiac and Pulmonary Rehab  Date 06/10/22  Educator [facility]  Instruction Review Code 1- Verbalizes Understanding       Core Components/Risk Factors/Patient Goals Review:   Goals and Risk Factor Review     Row  Name 06/17/22 [phone]             Core Components/Risk Factors/Patient Goals Review   Personal Goals Review Weight Management/Obesity;Diabetes;Hypertension       Review Sean [name] states that he wants to gain some weight and has begun to eat more to meet his weight goal. He would like to gain about 20 lbs with a weight goal of 180 lb. He has also been checking his blood sugars and reports that they are within normal ranges. He does not own a BP cuff so he has not been checking his blood pressures at home. His BP readings have been within normal ranges while at rehab.       Expected Outcomes Short: Continue to monitor blood sugars. Long: Continue to work towards weight goal.                Core Components/Risk Factors/Patient Goals at Discharge (Final Review):   Goals and Risk Factor Review - 06/17/22 0956       Core Components/Risk Factors/Patient Goals Review   Personal Goals Review Weight Management/Obesity;Diabetes;Hypertension    Review Sean [name] states that he wants to gain some weight and has begun to eat more to meet his weight goal. He would like to gain about 20 lbs with a weight goal of 180 lb. He has also been checking his blood sugars and reports that they are within normal ranges. He does not own a BP cuff so he has not been checking his blood pressures at home. His BP readings have been within normal ranges while at rehab.    Expected Outcomes Short: Continue to monitor blood sugars. Long: Continue to work towards weight goal.             ITP Comments:  ITP Comments     Row Name 05/25/22 1323 06/02/22 1119 06/03/22 1122 06/28/22 1333 06/30/22 0815   ITP Comments Initial phone call completed. Diagnosis can be found in [facility] 2/5. EP Orientation scheduled for Wednesday, 2/21 at 2:30. Completed 6MWT and gym orientation. Initial ITP created and sent for review to Dr. Zetta Bills, Medical Director.  He will return his paperwork on his first day. First full day of exercise!   Patient was oriented to gym and equipment including functions, settings, policies, and procedures.  Patient's individual exercise prescription and treatment plan were reviewed.  All starting workloads were established based on the results of the 6 minute walk test done at initial orientation visit.  The plan for exercise progression was also introduced and progression will be customized based on patient's performance and goals. Completed Initial RD Consultation 30 Day review completed. Medical Director ITP review done, changes made as directed, and signed approval by Medical Director.            Comments:

## 2022-07-01 ENCOUNTER — Encounter: Payer: Medicaid Other | Admitting: *Deleted

## 2022-07-01 DIAGNOSIS — J849 Interstitial pulmonary disease, unspecified: Secondary | ICD-10-CM | POA: Diagnosis not present

## 2022-07-01 NOTE — Progress Notes (Signed)
Daily Session Note  Patient Details  Name: Sean [name] MRN: [id number] Date of Birth: Sep 23, 1975 Referring Provider:   Flowsheet Row Pulmonary Rehab from 06/02/2022 in [facility] Cardiac and Pulmonary Rehab  Referring Provider Vonita Moss MD       Encounter Date: 07/01/2022  Check In:  Session Check In - 07/01/22 1020       Check-In   Supervising physician immediately available to respond to emergencies See telemetry face sheet for immediately available ER MD    Location [facility]-Cardiac & Pulmonary Rehab    Staff Present Larna Daughters, MS, ACSM CEP, Exercise Physiologist;Joseph Rosebud Poles, RN, Diamond Nickel RN, BSN    Virtual Visit No    Medication changes reported     No    Fall or balance concerns reported    No    Tobacco Cessation No Change    Warm-up and Cool-down Performed on first and last piece of equipment    Resistance Training Performed Yes    VAD Patient? No    PAD/SET Patient? No      Pain Assessment   Currently in Pain? No/denies               Exercise Prescription Changes - 07/01/22 1000       Home Exercise Plan   Plans to continue exercise at Home (comment)   walking, weights, staff videos   Frequency Add 3 additional days to program exercise sessions.    Initial Home Exercises Provided 07/01/22             Social History   Tobacco Use  Smoking Status Former   Types: Cigarettes  Smokeless Tobacco Never    Goals Met:  Independence with exercise equipment Exercise tolerated well No report of concerns or symptoms today Strength training completed today  Goals Unmet:  Not Applicable  Comments: Pt able to follow exercise prescription today without complaint.  Will continue to monitor for progression.    Dr. Emily Filbert is Medical Director for [facility].  Dr. Ottie Glazier is Medical Director for [facility] Pulmonary Rehabilitation.

## 2022-07-04 DIAGNOSIS — I519 Heart disease, unspecified: Secondary | ICD-10-CM | POA: Insufficient documentation

## 2022-07-06 ENCOUNTER — Encounter: Payer: Medicaid Other | Admitting: *Deleted

## 2022-07-06 DIAGNOSIS — J849 Interstitial pulmonary disease, unspecified: Secondary | ICD-10-CM | POA: Diagnosis not present

## 2022-07-06 NOTE — Progress Notes (Signed)
Daily Session Note  Patient Details  Name: Sean [name] MRN: [id number] Date of Birth: December 20, 1975 Referring Provider:   Flowsheet Row Pulmonary Rehab from 06/02/2022 in [facility] Cardiac and Pulmonary Rehab  Referring Provider Vonita Moss MD       Encounter Date: 07/06/2022  Check In:  Session Check In - 07/06/22 1035       Check-In   Supervising physician immediately available to respond to emergencies See telemetry face sheet for immediately available ER MD    Location [facility]-Cardiac & Pulmonary Rehab    Staff Present Heath Lark, RN, BSN, CCRP;Jessica [place], MA, RCEP, CCRP, Bertram Gala, MS, ACSM CEP, Exercise Physiologist    Virtual Visit No    Medication changes reported     No    Fall or balance concerns reported    No    Warm-up and Cool-down Performed on first and last piece of equipment    Resistance Training Performed Yes    VAD Patient? No    PAD/SET Patient? No      Pain Assessment   Currently in Pain? No/denies                Social History   Tobacco Use  Smoking Status Former   Types: Cigarettes  Smokeless Tobacco Never    Goals Met:  Proper associated with RPD/PD & O2 Sat Independence with exercise equipment Exercise tolerated well No report of concerns or symptoms today  Goals Unmet:  Not Applicable  Comments: Pt able to follow exercise prescription today without complaint.  Will continue to monitor for progression.    Dr. Emily Filbert is Medical Director for [place].  Dr. Ottie Glazier is Medical Director for [facility] Pulmonary Rehabilitation.

## 2022-07-15 ENCOUNTER — Encounter: Payer: Medicaid Other | Admitting: *Deleted

## 2022-07-20 ENCOUNTER — Encounter: Payer: Medicaid Other | Attending: Critical Care Medicine

## 2022-07-20 DIAGNOSIS — J849 Interstitial pulmonary disease, unspecified: Secondary | ICD-10-CM | POA: Insufficient documentation

## 2022-07-23 DIAGNOSIS — K623 Rectal prolapse: Secondary | ICD-10-CM | POA: Insufficient documentation

## 2022-07-27 ENCOUNTER — Telehealth: Payer: Self-pay | Admitting: *Deleted

## 2022-07-27 ENCOUNTER — Encounter: Payer: Self-pay | Admitting: *Deleted

## 2022-07-27 DIAGNOSIS — J849 Interstitial pulmonary disease, unspecified: Secondary | ICD-10-CM

## 2022-07-27 NOTE — Telephone Encounter (Signed)
Patient's mother called to let us know that Sean [name] is having a procedure done tomorrow and has prep today.  He will also be out again on Thursday.  He has not attended since 07/06/22 for various reasons.  We have not been able to assess for goals this round.

## 2022-07-28 ENCOUNTER — Encounter: Payer: Self-pay | Admitting: *Deleted

## 2022-07-28 DIAGNOSIS — J849 Interstitial pulmonary disease, unspecified: Secondary | ICD-10-CM

## 2022-07-28 NOTE — Progress Notes (Signed)
Pulmonary Individual Treatment Plan  Patient Details  Name: Sean [name] MRN: [id number] Date of Birth: 1975-10-27 Referring Provider:   Flowsheet Row Pulmonary Rehab from 06/02/2022 in [facility] [place] Cardiac and Pulmonary Rehab  Referring Provider Josph Macho MD       Initial Encounter Date:  Flowsheet Row Pulmonary Rehab from 06/02/2022 in [facility] Cardiac and Pulmonary Rehab  Date 06/02/22       Visit Diagnosis: ILD (interstitial lung disease)  Patient's Home Medications on Admission:  Current Outpatient Medications:    acetaminophen (TYLENOL) 500 MG tablet, Take by mouth., Disp: , Rfl:    albuterol (VENTOLIN HFA) 108 (90 Base) MCG/ACT inhaler, Inhale 2 puffs into the lungs every 6 (six) hours as needed for wheezing or shortness of breath., Disp: 8 g, Rfl: 2   blood glucose meter kit and supplies KIT, Dispense based on patient and insurance preference. Use up to four times daily as directed., Disp: 1 each, Rfl: 0   blood glucose meter kit and supplies, Dispense based on patient and insurance preference. Use up to four times daily as directed. (FOR ICD-10 E10.9, E11.9)., Disp: 1 each, Rfl: 0   Blood Glucose Monitoring Suppl (CONTOUR NEXT ONE) KIT, 4 (four) times daily., Disp: , Rfl:    BREZTRI AEROSPHERE 160-9-4.8 MCG/ACT AERO, Inhale 2 puffs into the lungs 2 (two) times daily., Disp: 10.7 g, Rfl: 0   chlorhexidine (PERIDEX) 0.12 % solution, 15 mLs 2 (two) times daily. (Patient not taking: Reported on 05/25/2022), Disp: , Rfl:    Cholecalciferol (VITAMIN D-1000 MAX ST) 25 MCG (1000 UT) tablet, Take by mouth., Disp: , Rfl:    clobetasol ointment (TEMOVATE) 0.05 %, Apply topically., Disp: , Rfl:    furosemide (LASIX) 20 MG tablet, Take 1 tablet (20 mg total) by mouth daily., Disp: 30 tablet, Rfl: 0   hydroxychloroquine (PLAQUENIL) 200 MG tablet, Place 1 tablet (200 mg total) into feeding tube 2 (two) times daily. (Patient not taking: Reported on 05/25/2022), Disp: , Rfl:    insulin aspart  (NOVOLOG) 100 UNIT/ML FlexPen, Inject 1-9 Units into the skin every 6 (six) hours. CBG 70 - 120: 0 units CBG 121 - 150: 1 unit CBG 151 - 200: 2 units CBG 201 - 250: 3 units CBG 251 - 300: 5 units CBG 301 - 350: 7 units CBG 351 - 400: 9 units CBG > 400: call MD and obtain STAT lab verification, Disp: 15 mL, Rfl: 11   Insulin Pen Needle (PEN NEEDLES 3/16") 31G X 5 MM MISC, 1 pen. by Does not apply route every 6 (six) hours., Disp: 200 each, Rfl: 1   lidocaine (XYLOCAINE) 2 % solution, Use as directed 15 mLs in the mouth or throat every 6 (six) hours as needed (mouth/throat pain). (Patient not taking: Reported on 05/25/2022), Disp: 100 mL, Rfl: 0   meloxicam (MOBIC) 15 MG tablet, Take by mouth., Disp: , Rfl:    Multiple Vitamins-Minerals (SUPER THERA VITE M) TABS, Take 1 tablet by mouth daily., Disp: , Rfl:    NONFORMULARY OR COMPOUNDED ITEM, LIDOCAINE/MAALOX XS 1:1 (Patient not taking: Reported on 05/25/2022), Disp: , Rfl:    pantoprazole (PROTONIX) 40 MG tablet, Take 1 tablet (40 mg total) by mouth daily. (Patient not taking: Reported on 05/25/2022), Disp: 30 tablet, Rfl: 0   tacrolimus (PROTOPIC) 0.1 % ointment, Apply topically 2 (two) times daily., Disp: , Rfl:    thiamine (VITAMIN B1) 100 MG tablet, Take by mouth., Disp: , Rfl:    traMADol (ULTRAM) 50 MG  tablet, Take by mouth., Disp: , Rfl:    triamcinolone cream (KENALOG) 0.1 %, Apply topically 2 (two) times daily., Disp: , Rfl:    Vitamin D 12.5 MCG/0.25ML LIQD, Give 1 mL by tube daily. (Patient not taking: Reported on 05/25/2022), Disp: 36 mL, Rfl: 1  Past Medical History: Past Medical History:  Diagnosis Date   H/O immunosuppressive therapy    Hyperpigmentation of skin    Hyperpigmentation rash/vesicles of the palm and face   Neutropenia (HCC)    RA (rheumatoid arthritis) (HCC)     Tobacco Use: Social History   Tobacco Use  Smoking Status Former   Types: Cigarettes  Smokeless Tobacco Never    Labs: Review Flowsheet       Latest  Ref Rng & Units 07/26/2021 09/10/2021 01/12/2022 06/03/2022  Labs for ITP Cardiac and Pulmonary Rehab  Cholestrol 0 - 200 mg/dL - - 161  096   LDL (calc) 0 - 99 mg/dL - - 91  64   HDL-C >04 mg/dL - - 38  35   Trlycerides <150 mg/dL 540  - 981  85   Hemoglobin A1c 4.8 - 5.6 % - 11.9  6.4  5.5      Pulmonary Assessment Scores:  Pulmonary Assessment Scores     Row Name 06/02/22 1137 06/08/22 0938       ADL UCSD   ADL Phase Exit Entry    SOB Score total -- 75    Rest -- 0    Walk -- 2    Stairs -- 5    Bath -- 3    Dress -- 3    Shop -- 4      CAT Score   CAT Score -- 16      mMRC Score   mMRC Score 4 --             UCSD: Self-administered rating of dyspnea associated with activities of daily living (ADLs) 6-point scale (0 = "not at all" to 5 = "maximal or unable to do because of breathlessness")  Scoring Scores range from 0 to 120.  Minimally important difference is 5 units  CAT: CAT can identify the health impairment of COPD patients and is better correlated with disease progression.  CAT has a scoring range of zero to 40. The CAT score is classified into four groups of low (less than 10), medium (10 - 20), high (21-30) and very high (31-40) based on the impact level of disease on health status. A CAT score over 10 suggests significant symptoms.  A worsening CAT score could be explained by an exacerbation, poor medication adherence, poor inhaler technique, or progression of COPD or comorbid conditions.  CAT MCID is 2 points  mMRC: mMRC (Modified Medical Research Council) Dyspnea Scale is used to assess the degree of baseline functional disability in patients of respiratory disease due to dyspnea. No minimal important difference is established. A decrease in score of 1 point or greater is considered a positive change.   Pulmonary Function Assessment:   Exercise Target Goals: Exercise Program Goal: Individual exercise prescription set using results from initial 6 min  walk test and THRR while considering  patient's activity barriers and safety.   Exercise Prescription Goal: Initial exercise prescription builds to 30-45 minutes a day of aerobic activity, 2-3 days per week.  Home exercise guidelines will be given to patient during program as part of exercise prescription that the participant will acknowledge.  Education: Aerobic Exercise: - Group verbal and visual  presentation on the components of exercise prescription. Introduces F.I.T.T principle from ACSM for exercise prescriptions.  Reviews F.I.T.T. principles of aerobic exercise including progression. Written material given at graduation.   Education: Resistance Exercise: - Group verbal and visual presentation on the components of exercise prescription. Introduces F.I.T.T principle from ACSM for exercise prescriptions  Reviews F.I.T.T. principles of resistance exercise including progression. Written material given at graduation.    Education: Exercise & Equipment Safety: - Individual verbal instruction and demonstration of equipment use and safety with use of the equipment. Flowsheet Row Pulmonary Rehab from 07/01/2022 in [facility] Cardiac and Pulmonary Rehab  Date 06/02/22  Educator [facility]  Instruction Review Code 1- Verbalizes Understanding       Education: Exercise Physiology & General Exercise Guidelines: - Group verbal and written instruction with models to review the exercise physiology of the cardiovascular system and associated critical values. Provides general exercise guidelines with specific guidelines to those with heart or lung disease.  Flowsheet Row Pulmonary Rehab from 07/01/2022 in [facility] Cardiac and Pulmonary Rehab  Date 07/01/22  Educator [facility]  Instruction Review Code 1- Verbalizes Understanding       Education: Flexibility, Balance, Mind/Body Relaxation: - Group verbal and visual presentation with interactive activity on the components of exercise prescription. Introduces F.I.T.T  principle from ACSM for exercise prescriptions. Reviews F.I.T.T. principles of flexibility and balance exercise training including progression. Also discusses the mind body connection.  Reviews various relaxation techniques to help reduce and manage stress (i.e. Deep breathing, progressive muscle relaxation, and visualization). Balance handout provided to take home. Written material given at graduation.   Activity Barriers & Risk Stratification:  Activity Barriers & Cardiac Risk Stratification - 06/02/22 1127       Activity Barriers & Cardiac Risk Stratification   Activity Barriers Shortness of Breath;Deconditioning;Muscular Weakness             6 Minute Walk:  6 Minute Walk     Row Name 06/02/22 1125         6 Minute Walk   Phase Initial     Distance 1261 feet     Walk Time 6 minutes     # of Rest Breaks 0     MPH 2.39     METS 5.42     RPE 13     Perceived Dyspnea  3     VO2 Peak 18.99     Symptoms Yes (comment)     Comments SOB     Resting HR 102 bpm     Resting BP 104/58     Resting Oxygen Saturation  87 %     Exercise Oxygen Saturation  during 6 min walk 79 %     Max Ex. HR 125 bpm     Max Ex. BP 136/74     2 Minute Post BP 122/66       Interval HR   1 Minute HR 113     2 Minute HR 117     3 Minute HR 122     4 Minute HR 125     5 Minute HR 125     6 Minute HR 125     2 Minute Post HR 107     Interval Heart Rate? Yes       Interval Oxygen   Interval Oxygen? Yes     Baseline Oxygen Saturation % 93 %     1 Minute Oxygen Saturation % 87 %     1 Minute Liters of  Oxygen 0 L  Room Air     2 Minute Oxygen Saturation % 86 %     2 Minute Liters of Oxygen 0 L     3 Minute Oxygen Saturation % 81 %     3 Minute Liters of Oxygen 0 L     4 Minute Oxygen Saturation % 80 %     4 Minute Liters of Oxygen 0 L     5 Minute Oxygen Saturation % 80 %     5 Minute Liters of Oxygen 0 L     6 Minute Oxygen Saturation % 81 %  79% at 5:30     6 Minute Liters of Oxygen 0 L      2 Minute Post Oxygen Saturation % 90 %     2 Minute Post Liters of Oxygen 0 L             Oxygen Initial Assessment:  Oxygen Initial Assessment - 06/02/22 1136       Home Oxygen   Home Oxygen Device E-Tanks;Home Concentrator    Sleep Oxygen Prescription None   2L when saturations get low at night as well   Home Exercise Oxygen Prescription None   encouraged to use   Home Resting Oxygen Prescription None    Compliance with Home Oxygen Use Yes      Initial 6 min Walk   Oxygen Used None      Program Oxygen Prescription   Program Oxygen Prescription Continuous;E-Tanks    Liters per minute 2    Comments use 1-2L for exercise to avoid desaturations with exercise      Intervention   Short Term Goals To learn and exhibit compliance with exercise, home and travel O2 prescription;To learn and understand importance of monitoring SPO2 with pulse oximeter and demonstrate accurate use of the pulse oximeter.;To learn and understand importance of maintaining oxygen saturations>88%;To learn and demonstrate proper pursed lip breathing techniques or other breathing techniques. ;To learn and demonstrate proper use of respiratory medications    Long  Term Goals Exhibits compliance with exercise, home  and travel O2 prescription;Verbalizes importance of monitoring SPO2 with pulse oximeter and return demonstration;Maintenance of O2 saturations>88%;Exhibits proper breathing techniques, such as pursed lip breathing or other method taught during program session;Compliance with respiratory medication;Demonstrates proper use of MDI's             Oxygen Re-Evaluation:  Oxygen Re-Evaluation     Row Name 06/03/22 1123 06/17/22 0959           Program Oxygen Prescription   Program Oxygen Prescription -- Continuous;E-Tanks      Liters per minute -- 2      Comments -- use 1-2L for exercise to avoid desaturations with exercise        Home Oxygen   Home Oxygen Device -- E-Tanks;Home Concentrator       Sleep Oxygen Prescription -- None      Home Exercise Oxygen Prescription -- None      Home Resting Oxygen Prescription -- None      Compliance with Home Oxygen Use -- Yes        Goals/Expected Outcomes   Short Term Goals -- To learn and exhibit compliance with exercise, home and travel O2 prescription;To learn and understand importance of monitoring SPO2 with pulse oximeter and demonstrate accurate use of the pulse oximeter.;To learn and understand importance of maintaining oxygen saturations>88%;To learn and demonstrate proper pursed lip breathing techniques or other breathing techniques. ;To learn  and demonstrate proper use of respiratory medications      Long  Term Goals -- Exhibits compliance with exercise, home  and travel O2 prescription;Verbalizes importance of monitoring SPO2 with pulse oximeter and return demonstration;Maintenance of O2 saturations>88%;Exhibits proper breathing techniques, such as pursed lip breathing or other method taught during program session;Compliance with respiratory medication;Demonstrates proper use of MDI's      Comments Reviewed PLB technique with pt.  Talked about how it works and it's importance in maintaining their exercise saturations. We reviewed PLB techique, and Sean [name] states that he has continued to practice PLB especially during exercise. He also states that he has been monitoring his oxygen saturations and reports that they have been dropping to 89-90% during exercise, but recovers well.      Goals/Expected Outcomes Short: Become more profiecient at using PLB. Long: Become independent at using PLB. Short: Become more profiecient at using PLB. Long: Become independent at using PLB.               Oxygen Discharge (Final Oxygen Re-Evaluation):  Oxygen Re-Evaluation - 06/17/22 0959       Program Oxygen Prescription   Program Oxygen Prescription Continuous;E-Tanks    Liters per minute 2    Comments use 1-2L for exercise to avoid desaturations  with exercise      Home Oxygen   Home Oxygen Device E-Tanks;Home Concentrator    Sleep Oxygen Prescription None    Home Exercise Oxygen Prescription None    Home Resting Oxygen Prescription None    Compliance with Home Oxygen Use Yes      Goals/Expected Outcomes   Short Term Goals To learn and exhibit compliance with exercise, home and travel O2 prescription;To learn and understand importance of monitoring SPO2 with pulse oximeter and demonstrate accurate use of the pulse oximeter.;To learn and understand importance of maintaining oxygen saturations>88%;To learn and demonstrate proper pursed lip breathing techniques or other breathing techniques. ;To learn and demonstrate proper use of respiratory medications    Long  Term Goals Exhibits compliance with exercise, home  and travel O2 prescription;Verbalizes importance of monitoring SPO2 with pulse oximeter and return demonstration;Maintenance of O2 saturations>88%;Exhibits proper breathing techniques, such as pursed lip breathing or other method taught during program session;Compliance with respiratory medication;Demonstrates proper use of MDI's    Comments We reviewed PLB techique, and Sean [name] states that he has continued to practice PLB especially during exercise. He also states that he has been monitoring his oxygen saturations and reports that they have been dropping to 89-90% during exercise, but recovers well.    Goals/Expected Outcomes Short: Become more profiecient at using PLB. Long: Become independent at using PLB.             Initial Exercise Prescription:  Initial Exercise Prescription - 06/02/22 1100       Date of Initial Exercise RX and Referring Provider   Date 06/02/22    Referring Provider Josph Macho MD      Oxygen   Oxygen Continuous    Liters 1-2    Maintain Oxygen Saturation 88% or higher      Treadmill   MPH 2.3    Grade 0.5    Minutes 15    METs 2.92      REL-XR   Level 2    Speed 50    Minutes 15     METs 2.5      T5 Nustep   Level 2    SPM 80    Minutes 15  METs 2.5      Track   Laps 34    Minutes 15    METs 2.85      Prescription Details   Frequency (times per week) 2    Duration Progress to 30 minutes of continuous aerobic without signs/symptoms of physical distress      Intensity   THRR 40-80% of Max Heartrate 132-161    Ratings of Perceived Exertion 11-13    Perceived Dyspnea 0-4      Progression   Progression Continue to progress workloads to maintain intensity without signs/symptoms of physical distress.      Resistance Training   Training Prescription Yes    Weight 5 lb    Reps 10-15             Perform Capillary Blood Glucose checks as needed.  Exercise Prescription Changes:   Exercise Prescription Changes     Row Name 06/02/22 1100 06/07/22 1000 06/21/22 1300 07/01/22 1000 07/05/22 1100     Response to Exercise   Blood Pressure (Admit) 104/58 118/62 124/80 -- 126/72   Blood Pressure (Exercise) 136/74 128/68 134/80 -- 128/68   Blood Pressure (Exit) 108/60 106/62 108/70 -- 110/66   Heart Rate (Admit) 102 bpm 98 bpm 97 bpm -- 98 bpm   Heart Rate (Exercise) 125 bpm 111 bpm 131 bpm -- 119 bpm   Heart Rate (Exit) 99 bpm 104 bpm 106 bpm -- 104 bpm   Oxygen Saturation (Admit) 93 % 94 % 92 % -- 95 %   Oxygen Saturation (Exercise) 79 % 89 % 89 % -- 90 %   Oxygen Saturation (Exit) 95 % 94 % 94 % -- 96 %   Rating of Perceived Exertion (Exercise) 13 13 14  -- 13   Perceived Dyspnea (Exercise) 3 1 2  -- 1   Symptoms SOB SOB SOB -- SOB   Comments walk test results 1st full day of exercise -- -- --   Duration -- Progress to 30 minutes of  aerobic without signs/symptoms of physical distress Continue with 30 min of aerobic exercise without signs/symptoms of physical distress. -- Continue with 30 min of aerobic exercise without signs/symptoms of physical distress.   Intensity -- THRR unchanged THRR unchanged -- THRR unchanged     Progression   Progression  -- Continue to progress workloads to maintain intensity without signs/symptoms of physical distress. Continue to progress workloads to maintain intensity without signs/symptoms of physical distress. -- Continue to progress workloads to maintain intensity without signs/symptoms of physical distress.   Average METs -- 2.85 2.51 -- 3.06     Resistance Training   Training Prescription -- Yes Yes -- Yes   Weight -- 5 lb  bands 5 lb -- 5 lb   Reps -- 10-15 10-15 -- 10-15     Interval Training   Interval Training -- No No -- No     Oxygen   Oxygen -- Continuous Continuous -- Continuous   Liters -- 1-2 1-2 -- 2     Treadmill   MPH -- -- 2.3 -- 2.3   Grade -- -- 0.5 -- 0.5   Minutes -- -- 15 -- 15   METs -- -- 2.92 -- 2.92     Recumbant Elliptical   Level -- -- -- -- 3   Minutes -- -- -- -- 15   METs -- -- -- -- 2.4     REL-XR   Level -- 3 2 -- 2   Minutes -- 15 15 --  15   METs -- 4 3.7 -- 3     T5 Nustep   Level -- -- 3 -- --   Minutes -- -- 15 -- --   METs -- -- 1.9 -- --     Track   Laps -- 13 30 -- --   Minutes -- 15 15 -- --   METs -- 1.71 2.63 -- --     Home Exercise Plan   Plans to continue exercise at -- -- -- Home (comment)  walking, weights, staff videos Home (comment)  walking, weights, staff videos   Frequency -- -- -- Add 3 additional days to program exercise sessions. Add 3 additional days to program exercise sessions.   Initial Home Exercises Provided -- -- -- 07/01/22 07/01/22     Oxygen   Maintain Oxygen Saturation -- 88% or higher 88% or higher -- 88% or higher    Row Name 07/19/22 1400             Response to Exercise   Blood Pressure (Admit) 132/70       Blood Pressure (Exercise) 130/68       Blood Pressure (Exit) 118/70       Heart Rate (Admit) 104 bpm       Heart Rate (Exercise) 113 bpm       Heart Rate (Exit) 113 bpm       Oxygen Saturation (Admit) 90 %       Oxygen Saturation (Exercise) 88 %       Oxygen Saturation (Exit) 96 %        Rating of Perceived Exertion (Exercise) 11       Symptoms SOB       Duration Continue with 30 min of aerobic exercise without signs/symptoms of physical distress.       Intensity THRR unchanged         Progression   Progression Continue to progress workloads to maintain intensity without signs/symptoms of physical distress.       Average METs 3.61         Resistance Training   Training Prescription Yes       Weight 5 lb       Reps 10-15         Interval Training   Interval Training No         Oxygen   Oxygen Continuous       Liters 2         Treadmill   MPH 2.1       Grade 0       Minutes 15       METs 2.61         REL-XR   Level 5       Minutes 15       METs 4.6         Home Exercise Plan   Plans to continue exercise at Home (comment)  walking, weights, staff videos       Frequency Add 3 additional days to program exercise sessions.       Initial Home Exercises Provided 07/01/22         Oxygen   Maintain Oxygen Saturation 88% or higher                Exercise Comments:   Exercise Comments     Row Name 06/03/22 1122           Exercise Comments First full day of exercise!  Patient was  oriented to gym and equipment including functions, settings, policies, and procedures.  Patient's individual exercise prescription and treatment plan were reviewed.  All starting workloads were established based on the results of the 6 minute walk test done at initial orientation visit.  The plan for exercise progression was also introduced and progression will be customized based on patient's performance and goals.                Exercise Goals and Review:   Exercise Goals     Row Name 06/02/22 1134             Exercise Goals   Increase Physical Activity Yes       Intervention Provide advice, education, support and counseling about physical activity/exercise needs.;Develop an individualized exercise prescription for aerobic and resistive training based on  initial evaluation findings, risk stratification, comorbidities and participant's personal goals.       Expected Outcomes Short Term: Attend rehab on a regular basis to increase amount of physical activity.;Long Term: Exercising regularly at least 3-5 days a week.;Long Term: Add in home exercise to make exercise part of routine and to increase amount of physical activity.       Increase Strength and Stamina Yes       Intervention Provide advice, education, support and counseling about physical activity/exercise needs.;Develop an individualized exercise prescription for aerobic and resistive training based on initial evaluation findings, risk stratification, comorbidities and participant's personal goals.       Expected Outcomes Short Term: Increase workloads from initial exercise prescription for resistance, speed, and METs.;Short Term: Perform resistance training exercises routinely during rehab and add in resistance training at home;Long Term: Improve cardiorespiratory fitness, muscular endurance and strength as measured by increased METs and functional capacity ( )       Able to understand and use rate of perceived exertion (RPE) scale Yes       Intervention Provide education and explanation on how to use RPE scale       Expected Outcomes Short Term: Able to use RPE daily in rehab to express subjective intensity level;Long Term:  Able to use RPE to guide intensity level when exercising independently       Able to understand and use Dyspnea scale Yes       Intervention Provide education and explanation on how to use Dyspnea scale       Expected Outcomes Short Term: Able to use Dyspnea scale daily in rehab to express subjective sense of shortness of breath during exertion;Long Term: Able to use Dyspnea scale to guide intensity level when exercising independently       Knowledge and understanding of Target Heart Rate Range (THRR) Yes       Intervention Provide education and explanation of THRR  including how the numbers were predicted and where they are located for reference       Expected Outcomes Short Term: Able to state/look up THRR;Short Term: Able to use daily as guideline for intensity in rehab;Long Term: Able to use THRR to govern intensity when exercising independently       Able to check pulse independently Yes       Intervention Provide education and demonstration on how to check pulse in carotid and radial arteries.;Review the importance of being able to check your own pulse for safety during independent exercise       Expected Outcomes Short Term: Able to explain why pulse checking is important during independent exercise;Long Term: Able to check pulse independently  and accurately       Understanding of Exercise Prescription Yes       Intervention Provide education, explanation, and written materials on patient's individual exercise prescription       Expected Outcomes Short Term: Able to explain program exercise prescription;Long Term: Able to explain home exercise prescription to exercise independently                Exercise Goals Re-Evaluation :  Exercise Goals Re-Evaluation     Row Name 06/03/22 1122 06/07/22 1040 06/17/22 0947 06/21/22 1342 06/29/22 1023     Exercise Goal Re-Evaluation   Exercise Goals Review Able to understand and use rate of perceived exertion (RPE) scale;Able to understand and use Dyspnea scale;Knowledge and understanding of Target Heart Rate Range (THRR);Understanding of Exercise Prescription Understanding of Exercise Prescription;Increase Physical Activity;Increase Strength and Stamina Understanding of Exercise Prescription;Increase Physical Activity;Increase Strength and Stamina Understanding of Exercise Prescription;Increase Physical Activity;Increase Strength and Stamina Increase Physical Activity   Comments Reviewed RPE scale, THR and program prescription with pt today.  Pt voiced understanding and was given a copy of goals to take home.  Sean [name] did well his first day of rehab. He was able to exercise on level 3 on the XR and was able to walk the track around 15 laps. He uses his oxygen on as needed basis, and used 2L when walking. We will monitor to ensure his oxygen saturations stay above 88% at all times. We will continue to monitor as he progresses in the program. Sean [name] is doing well in rehab. He feels that his breathing and stamina have improved since he began the program. He has began to improve his workloads and laps on the track. He has been going to the the [facility] for resistance training with handweights. We talked about possibly starting to walk the track some at the [facility] as well. We will continue to monitor his progress in the program. Sean [name] is doing well in the program. He has done well with the XR at level 2 and improved to level 3 on the T5 nustep. He also walked up to 30 laps on the track and has continued to walk at a speed of 2.3 mph with a 0.5% incline on the treadmill. He has still been rating his shortness of breath as a 2 on the dyspnea scale during exercise. We will continue to monitor his progress in the program. Sean [name] was encouraged to start walking down to rehab with his oxygen pushing wheelchair.   Expected Outcomes Short: Use RPE daily to regulate intensity. Long: Follow program prescription in THR. ShorT: Continue to follow initial exercise prescription Long: Build up overall strength and stamina Short: Begin walking at the [facility] along with resistance training. Long: Continue to improve strength and stamina Short: Progressively increase treadmill workload. Long: Continue to improve strength and stamina. Short: Start to walk to rehab Long: Continue to improve stamina    Row Name 07/01/22 1012 07/05/22 1105 07/19/22 1432         Exercise Goal Re-Evaluation   Exercise Goals Review Increase Physical Activity;Increase Strength and Stamina;Able to understand and use rate of perceived exertion (RPE) scale;Able to  understand and use Dyspnea scale;Knowledge and understanding of Target Heart Rate Range (THRR);Able to check pulse independently;Understanding of Exercise Prescription Increase Physical Activity;Increase Strength and Stamina;Understanding of Exercise Prescription Increase Physical Activity;Increase Strength and Stamina;Understanding of Exercise Prescription     Comments Reviewed home exercise with pt today.  Pt plans to walk and use weights  with his weight machine at home for exercise.  We also talked about using staff videos as an option as well.  Reviewed THR, pulse, RPE, sign and symptoms, pulse oximetry and when to call 911 or MD.  Also discussed weather considerations and indoor options.  Pt voiced understanding. Sean [name] is doing well in rehab. He has stayed consistent at 2.3/1% workload on the treadmill and would benefit from increasing that. He tried out the REL for the first time and was able to work out at level 3. His oxygen saturations are staying above 88% and RPEs are staying appropriate. Will continue to monitor. Sean [name] has only attended rehab once since the last review. During his one session he walked the treadmill at 2.1 mph with no incline. He also improved from level 2 to level 5 on the XR. He has continued to use 5 lb hand weights for resistance training as well. We will continue to monitor his progress in the program.     Expected Outcomes Short: Start to add in more walking at home Long: Continue to exercise independently Short: Increase speed on treadmill Long: Continue to increase overall MET level and stamina Short: Return to regular attendance in the program. Long: Continue to improve strength and stamina.              Discharge Exercise Prescription (Final Exercise Prescription Changes):  Exercise Prescription Changes - 07/19/22 1400       Response to Exercise   Blood Pressure (Admit) 132/70    Blood Pressure (Exercise) 130/68    Blood Pressure (Exit) 118/70    Heart  Rate (Admit) 104 bpm    Heart Rate (Exercise) 113 bpm    Heart Rate (Exit) 113 bpm    Oxygen Saturation (Admit) 90 %    Oxygen Saturation (Exercise) 88 %    Oxygen Saturation (Exit) 96 %    Rating of Perceived Exertion (Exercise) 11    Symptoms SOB    Duration Continue with 30 min of aerobic exercise without signs/symptoms of physical distress.    Intensity THRR unchanged      Progression   Progression Continue to progress workloads to maintain intensity without signs/symptoms of physical distress.    Average METs 3.61      Resistance Training   Training Prescription Yes    Weight 5 lb    Reps 10-15      Interval Training   Interval Training No      Oxygen   Oxygen Continuous    Liters 2      Treadmill   MPH 2.1    Grade 0    Minutes 15    METs 2.61      REL-XR   Level 5    Minutes 15    METs 4.6      Home Exercise Plan   Plans to continue exercise at Home (comment)   walking, weights, staff videos   Frequency Add 3 additional days to program exercise sessions.    Initial Home Exercises Provided 07/01/22      Oxygen   Maintain Oxygen Saturation 88% or higher             Nutrition:  Target Goals: Understanding of nutrition guidelines, daily intake of sodium 1500mg , cholesterol 200mg , calories 30% from fat and 7% or less from saturated fats, daily to have 5 or more servings of fruits and vegetables.  Education: All About Nutrition: -Group instruction provided by verbal, written material, interactive activities, discussions, models, and  posters to present general guidelines for heart healthy nutrition including fat, fiber, MyPlate, the role of sodium in heart healthy nutrition, utilization of the nutrition label, and utilization of this knowledge for meal planning. Follow up email sent as well. Written material given at graduation.   Biometrics:  Pre Biometrics - 06/02/22 1134       Pre Biometrics   Height 6' 5.5" (1.969 m)    Weight 153 lb 8 oz (69.6  kg)    Waist Circumference 28 inches    Hip Circumference 32 inches    Waist to Hip Ratio 0.88 %    BMI (Calculated) 17.96    Single Leg Stand 30 seconds              Nutrition Therapy Plan and Nutrition Goals:  Nutrition Therapy & Goals - 06/28/22 1333       Nutrition Therapy   Diet Heart healthy, low Na, high kal/high protein    Protein (specify units) 125g    Fiber 35 grams    Whole Grain Foods 3 servings    Saturated Fats 16 max. grams    Fruits and Vegetables 8 servings/day    Sodium 2 grams      Personal Nutrition Goals   Nutrition Goal ST: build meals with heart healthy fats, fiber, and protein. Cut down on salt used after cooking. Review paperwork and Increase involvement in meal planning/prepping.  LT: follow MyPlate guidelines, gain weight and muscle mass, limit Na <2g/day    Comments 47 y.o. M admitted to pulmonary rehab for ILD. 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 Reviewed relevant medications: humira,furosemide (pt reports no longer prescribed) , GI Cocktail (RULOX,LIDOCAINE), hydroxychloroquine, mycophenolate, pantoprazole, prednisone, breztri, vit D2 . B: grilled cheese sandwich or oatmeal L: big meal- chicken sub or philly cheese steak sub S:granola bar before gym and protein shake after gym D: pizza or meat or chicken with vegetables. Sean [name] reports not cooking or shopping and that usually his girlfriend or mom will cook for him. His mom reports that they bake or grill and use olive oil, she uses mrs dash, but does not add salt while cooking. Alvon reports using salt afterwards, but he is not sure how much he adds. He reports going out to eat 3-4x week. Drinks: sweet tea or fruit punch or sometimes soda. He reports that he has never made changes to his diet before. He reports that his weight has been stable - he lost  weight (~60lbs over the course of 4-5 months) when he first started getting sick - he feels the medication dulled his appetite and he reports his appetetite has improved. Sean [name] reports using serious mass protein, but onlya half a scoop - this provides 630 calories and 25g of protein; discussed how this also contains many vitamins and minerals and cautioned against exceeding recommended doses. Encouraged to make sure any supplements are third party tested. Discussed general healthy eating and pulmonary MNT. Discussed high calorie/high protein diet and encouraged eating foods higher in fat to increase calories without increased volume - discussed how most of these fats should ideally come from healthy/nutritionally dense sources; peanut butter, liquid plant oils, avocado, and whole fat greek yogurt. Discussed that he could have another scoop of protein powder (2 scoops total/day) for another snack or sip on it in between meals. Encouraged him to become more involved in the cooking and meal planning/shopping process.  Intervention Plan   Intervention Prescribe, educate and counsel regarding individualized specific dietary modifications aiming towards targeted core components such as weight, hypertension, lipid management, diabetes, heart failure and other comorbidities.    Expected Outcomes Short Term Goal: Understand basic principles of dietary content, such as calories, fat, sodium, cholesterol and nutrients.;Short Term Goal: A plan has been developed with personal nutrition goals set during dietitian appointment.;Long Term Goal: Adherence to prescribed nutrition plan.             Nutrition Assessments:  MEDIFICTS Score Key: ?70 Need to make dietary changes  40-70 Heart Healthy Diet ? 40 Therapeutic Level Cholesterol Diet  Flowsheet Row Pulmonary Rehab from 06/08/2022 in [facility] Card[facility]  Picture Your Plate Total Score on Admission 52      Picture Your Plate Scores: <16  Unhealthy dietary pattern with much room for improvement. 41-50 Dietary pattern unlikely to meet recommendations for good health and room for improvement. 51-60 More healthful dietary pattern, with some room for improvement.  >60 Healthy dietary pattern, although there may be some specific behaviors that could be improved.   Nutrition Goals Re-Evaluation:  Nutrition Goals Re-Evaluation     Row Name 06/17/22 1003             Goals   Comment Sean [name] is looking forward to meeting with the RD and has a meeting scheduled for next week. He is looking forward to talking to the RD about healthy eating patterns to help him gain weight.       Expected Outcome Short: Meet with RD. Long: Implement dietary patterns discussed with RD.                Nutrition Goals Discharge (Final Nutrition Goals Re-Evaluation):  Nutrition Goals Re-Evaluation - 06/17/22 1003       Goals   Comment Sean [name] is looking forward to meeting with the RD and has a meeting scheduled for next week. He is looking forward to talking to the RD about healthy eating patterns to help him gain weight.    Expected Outcome Short: Meet with RD. Long: Implement dietary patterns discussed with RD.             Psychosocial: Target Goals: Acknowledge presence or absence of significant depression and/or stress, maximize coping skills, provide positive support system. Participant is able to verbalize types and ability to use techniques and skills needed for reducing stress and depression.   Education: Stress, Anxiety, and Depression - Group verbal and visual presentation to define topics covered.  Reviews how body is impacted by stress, anxiety, and depression.  Also discusses healthy ways to reduce stress and to treat/manage anxiety and depression.  Written material given at graduation.   Education: Sleep Hygiene -Provides group verbal and written instruction about how sleep can affect your health.  Define sleep hygiene,  discuss sleep cycles and impact of sleep habits. Review good sleep hygiene tips.    Initial Review & Psychosocial Screening:  Initial Psych Review & Screening - 05/25/22 1308       Initial Review   Current issues with Current Stress Concerns    Source of Stress Concerns Unable to perform yard/household activities;Unable to participate in former interests or hobbies      Family Dynamics   Good Support System? Yes   mom     Barriers   Psychosocial barriers to participate in program There are no identifiable barriers or psychosocial needs.;The patient should benefit from training in stress management and  relaxation.      Screening Interventions   Interventions Encouraged to exercise;Provide feedback about the scores to participant;To provide support and resources with identified psychosocial needs    Expected Outcomes Long Term Goal: Stressors or current issues are controlled or eliminated.;Short Term goal: Utilizing psychosocial counselor, staff and physician to assist with identification of specific Stressors or current issues interfering with healing process. Setting desired goal for each stressor or current issue identified.;Short Term goal: Identification and review with participant of any Quality of Life or Depression concerns found by scoring the questionnaire.;Long Term goal: The participant improves quality of Life and PHQ9 Scores as seen by post scores and/or verbalization of changes             Quality of Life Scores:  Scores of 19 and below usually indicate a poorer quality of life in these areas.  A difference of  2-3 points is a clinically meaningful difference.  A difference of 2-3 points in the total score of the Quality of Life Index has been associated with significant improvement in overall quality of life, self-image, physical symptoms, and general health in studies assessing change in quality of life.  PHQ-9: Review Flowsheet       06/29/2022 06/02/2022 10/22/2021   Depression screen PHQ 2/9  Decreased Interest 1 2 0  Down, Depressed, Hopeless 0 1 0  PHQ - 2 Score 1 3 0  Altered sleeping 1 1 -  Tired, decreased energy 1 2 -  Change in appetite 0 1 -  Feeling bad or failure about yourself  0 1 -  Trouble concentrating 0 0 -  Moving slowly or fidgety/restless 0 1 -  Suicidal thoughts 0 0 -  PHQ-9 Score 3 9 -  Difficult doing work/chores Somewhat difficult Very difficult -   Interpretation of Total Score  Total Score Depression Severity:  1-4 = Minimal depression, 5-9 = Mild depression, 10-14 = Moderate depression, 15-19 = Moderately severe depression, 20-27 = Severe depression   Psychosocial Evaluation and Intervention:  Psychosocial Evaluation - 05/25/22 1318       Psychosocial Evaluation & Interventions   Interventions Encouraged to exercise with the program and follow exercise prescription    Comments Sean [name] is coming to pulmonary rehab with worsening ILD. He reports that since he started getting sick a few years ago, he hasn't really felt like doing much. He finds himself lying around more and getting more winded when he does get up to do things. He used to work in a [employer] and enjoy exercising. He wants to get back some of the strength he has lost. He knows it will be a journey getting back to a more active lifestyle and is wary of his breathing's reaction, but he wants to put in the work. He enjoys playing video games for fun and has a good support system.    Expected Outcomes Short: attend pulmonary rehab for education and exercise. Long: develop and maintain positive self care habits.    Continue Psychosocial Services  Follow up required by staff             Psychosocial Re-Evaluation:  Psychosocial Re-Evaluation     Row Name 06/17/22 0952 06/29/22 1010           Psychosocial Re-Evaluation   Current issues with Current Sleep Concerns;Current Psychotropic Meds Current Psychotropic Meds      Comments Sean [name] denies any  current stressors at this time. He reports that his sleep has improved since starting some  of his medications, however he does want to become too dependent on the medication to help him sleep. He states that exercise has been a good avenue of stress relief for him. He also reports having a good support system made up by his parents, kids, and girlfriend. Sean [name] PHQ score decreased from a 9 to a 3! Continues ti stay compliant with medications. Exercise is helping him take his mind off of things. Will continue to monitor.      Expected Outcomes Short: Continue to exercise for mental boost. Long: Maintain positive outlook. Short: Continue exercise for mental health Long: Continue to maintain positive attitude      Interventions Encouraged to attend Pulmonary Rehabilitation for the exercise Encouraged to attend Pulmonary Rehabilitation for the exercise      Continue Psychosocial Services  Follow up required by staff Follow up required by staff               Psychosocial Discharge (Final Psychosocial Re-Evaluation):  Psychosocial Re-Evaluation - 06/29/22 1010       Psychosocial Re-Evaluation   Current issues with Current Psychotropic Meds    Comments Sean [name] PHQ score decreased from a 9 to a 3! Continues ti stay compliant with medications. Exercise is helping him take his mind off of things. Will continue to monitor.    Expected Outcomes Short: Continue exercise for mental health Long: Continue to maintain positive attitude    Interventions Encouraged to attend Pulmonary Rehabilitation for the exercise    Continue Psychosocial Services  Follow up required by staff             Education: Education Goals: Education classes will be provided on a weekly basis, covering required topics. Participant will state understanding/return demonstration of topics presented.  Learning Barriers/Preferences:   General Pulmonary Education Topics:  Infection Prevention: - Provides verbal and written  material to individual with discussion of infection control including proper hand washing and proper equipment cleaning during exercise session. Flowsheet Row Pulmonary Rehab from 07/01/2022 in [facility] Cardiac and Pulmonary Rehab  Date 06/02/22  Educator [facility]  Instruction Review Code 1- Verbalizes Understanding       Falls Prevention: - Provides verbal and written material to individual with discussion of falls prevention and safety. Flowsheet Row Pulmonary Rehab from 07/01/2022 in [facility] Cardiac and Pulmonary Rehab  Date 06/02/22  Educator [facility]  Instruction Review Code 1- Verbalizes Understanding       Chronic Lung Disease Review: - Group verbal instruction with posters, models, PowerPoint presentations and videos,  to review new updates, new respiratory medications, new advancements in procedures and treatments. Providing information on websites and "800" numbers for continued self-education. Includes information about supplement oxygen, available portable oxygen systems, continuous and intermittent flow rates, oxygen safety, concentrators, and Medicare reimbursement for oxygen. Explanation of Pulmonary Drugs, including class, frequency, complications, importance of spacers, rinsing mouth after steroid MDI's, and proper cleaning methods for nebulizers. Review of basic lung anatomy and physiology related to function, structure, and complications of lung disease. Review of risk factors. Discussion about methods for diagnosing sleep apnea and types of masks and machines for OSA. Includes a review of the use of types of environmental controls: home humidity, furnaces, filters, dust mite/pet prevention, HEPA vacuums. Discussion about weather changes, air quality and the benefits of nasal washing. Instruction on Warning signs, infection symptoms, calling MD promptly, preventive modes, and value of vaccinations. Review of effective airway clearance, coughing and/or vibration techniques. Emphasizing that all  should Create an Action Plan. Written  material given at graduation. Flowsheet Row Pulmonary Rehab from 07/01/2022 in [facility] Cardiac and Pulmonary Rehab  Date 06/17/22  Educator [facility]  Instruction Review Code 1- Verbalizes Understanding       AED/CPR: - Group verbal and written instruction with the use of models to demonstrate the basic use of the AED with the basic ABC's of resuscitation.    Anatomy and Cardiac Procedures: - Group verbal and visual presentation and models provide information about basic cardiac anatomy and function. Reviews the testing methods done to diagnose heart disease and the outcomes of the test results. Describes the treatment choices: Medical Management, Angioplasty, or Coronary Bypass Surgery for treating various heart conditions including Myocardial Infarction, Angina, Valve Disease, and Cardiac Arrhythmias.  Written material given at graduation.   Medication Safety: - Group verbal and visual instruction to review commonly prescribed medications for heart and lung disease. Reviews the medication, class of the drug, and side effects. Includes the steps to properly store meds and maintain the prescription regimen.  Written material given at graduation.   Other: -Provides group and verbal instruction on various topics (see comments)   Knowledge Questionnaire Score:  Knowledge Questionnaire Score - 06/08/22 0939       Knowledge Questionnaire Score   Pre Score 13/16              Core Components/Risk Factors/Patient Goals at Admission:  Personal Goals and Risk Factors at Admission - 06/02/22 1135       Core Components/Risk Factors/Patient Goals on Admission    Weight Management Yes;Weight Gain    Intervention Weight Management: Develop a combined nutrition and exercise program designed to reach desired caloric intake, while maintaining appropriate intake of nutrient and fiber, sodium and fats, and appropriate energy expenditure required for the weight  goal.;Weight Management: Provide education and appropriate resources to help participant work on and attain dietary goals.    Admit Weight 153 lb 8 oz (69.6 kg)    Goal Weight: Short Term 160 lb (72.6 kg)    Goal Weight: Long Term 165 lb (74.8 kg)    Expected Outcomes Long Term: Adherence to nutrition and physical activity/exercise program aimed toward attainment of established weight goal;Short Term: Continue to assess and modify interventions until short term weight is achieved;Weight Gain: Understanding of general recommendations for a high calorie, high protein meal plan that promotes weight gain by distributing calorie intake throughout the day with the consumption for 4-5 meals, snacks, and/or supplements;Weight Maintenance: Understanding of the daily nutrition guidelines, which includes 25-35% calories from fat, 7% or less cal from saturated fats, less than 200mg  cholesterol, less than 1.5gm of sodium, & 5 or more servings of fruits and vegetables daily    Improve shortness of breath with ADL's Yes    Intervention Provide education, individualized exercise plan and daily activity instruction to help decrease symptoms of SOB with activities of daily living.    Expected Outcomes Short Term: Improve cardiorespiratory fitness to achieve a reduction of symptoms when performing ADLs;Long Term: Be able to perform more ADLs without symptoms or delay the onset of symptoms    Increase knowledge of respiratory medications and ability to use respiratory devices properly  Yes    Intervention Provide education and demonstration as needed of appropriate use of medications, inhalers, and oxygen therapy.    Expected Outcomes Short Term: Achieves understanding of medications use. Understands that oxygen is a medication prescribed by physician. Demonstrates appropriate use of inhaler and oxygen therapy.;Long Term: Maintain appropriate use of medications,  inhalers, and oxygen therapy.    Diabetes Yes    Intervention  Provide education about signs/symptoms and action to take for hypo/hyperglycemia.;Provide education about proper nutrition, including hydration, and aerobic/resistive exercise prescription along with prescribed medications to achieve blood glucose in normal ranges: Fasting glucose 65-99 mg/dL    Expected Outcomes Short Term: Participant verbalizes understanding of the signs/symptoms and immediate care of hyper/hypoglycemia, proper foot care and importance of medication, aerobic/resistive exercise and nutrition plan for blood glucose control.;Long Term: Attainment of HbA1C < 7%.    Hypertension Yes    Intervention Provide education on lifestyle modifcations including regular physical activity/exercise, weight management, moderate sodium restriction and increased consumption of fresh fruit, vegetables, and low fat dairy, alcohol moderation, and smoking cessation.;Monitor prescription use compliance.    Expected Outcomes Short Term: Continued assessment and intervention until BP is < 140/85mm HG in hypertensive participants. < 130/46mm HG in hypertensive participants with diabetes, heart failure or chronic kidney disease.;Long Term: Maintenance of blood pressure at goal levels.             Education:Diabetes - Individual verbal and written instruction to review signs/symptoms of diabetes, desired ranges of glucose level fasting, after meals and with exercise. Acknowledge that pre and post exercise glucose checks will be done for 3 sessions at entry of program. Flowsheet Row Pulmonary Rehab from 07/01/2022 in [facility] Cardiac and Pulmonary Rehab  Date 05/25/22  Educator [facility]  Instruction Review Code 1- Verbalizes Understanding       Know Your Numbers and Heart Failure: - Group verbal and visual instruction to discuss disease risk factors for cardiac and pulmonary disease and treatment options.  Reviews associated critical values for Overweight/Obesity, Hypertension, Cholesterol, and Diabetes.  Discusses  basics of heart failure: signs/symptoms and treatments.  Introduces Heart Failure Zone chart for action plan for heart failure.  Written material given at graduation. Flowsheet Row Pulmonary Rehab from 07/01/2022 in [facility] Cardiac and Pulmonary Rehab  Date 06/10/22  Educator [facility]  Instruction Review Code 1- Verbalizes Understanding       Core Components/Risk Factors/Patient Goals Review:   Goals and Risk Factor Review     Row Name 06/17/22 [phone]             Core Components/Risk Factors/Patient Goals Review   Personal Goals Review Weight Management/Obesity;Diabetes;Hypertension       Review Sean [name] states that he wants to gain some weight and has begun to eat more to meet his weight goal. He would like to gain about 20 lbs with a weight goal of 180 lb. He has also been checking his blood sugars and reports that they are within normal ranges. He does not own a BP cuff so he has not been checking his blood pressures at home. His BP readings have been within normal ranges while at rehab.       Expected Outcomes Short: Continue to monitor blood sugars. Long: Continue to work towards weight goal.                Core Components/Risk Factors/Patient Goals at Discharge (Final Review):   Goals and Risk Factor Review - 06/17/22 0956       Core Components/Risk Factors/Patient Goals Review   Personal Goals Review Weight Management/Obesity;Diabetes;Hypertension    Review Sean [name] states that he wants to gain some weight and has begun to eat more to meet his weight goal. He would like to gain about 20 lbs with a weight goal of 180 lb. He has also been checking his  blood sugars and reports that they are within normal ranges. He does not own a BP cuff so he has not been checking his blood pressures at home. His BP readings have been within normal ranges while at rehab.    Expected Outcomes Short: Continue to monitor blood sugars. Long: Continue to work towards weight goal.             ITP  Comments:  ITP Comments     Row Name 05/25/22 1323 06/02/22 1119 06/03/22 1122 06/28/22 1333 06/30/22 0815   ITP Comments Initial phone call completed. Diagnosis can be found in [facility] 2/5. EP Orientation scheduled for Wednesday, 2/21 at 2:30. Completed and gym orientation. Initial ITP created and sent for review to Dr. Jinny Sanders, Medical Director.  He will return his paperwork on his first day. First full day of exercise!  Patient was oriented to gym and equipment including functions, settings, policies, and procedures.  Patient's individual exercise prescription and treatment plan were reviewed.  All starting workloads were established based on the results of the 6 minute walk test done at initial orientation visit.  The plan for exercise progression was also introduced and progression will be customized based on patient's performance and goals. Completed Initial RD Consultation 30 Day review completed. Medical Director ITP review done, changes made as directed, and signed approval by Medical Director.    Row Name 07/27/22 [phone] 07/28/22 0841         ITP Comments Patient's mother called to let us know that Sean [name] is having a procedure done tomorrow and has prep today.  He will also be out again on Thursday.  He has not attended since 07/06/22 for various reasons.  We have not been able to assess for goals this round. 30 day review completed. ITP sent to Dr. Jinny Sanders, Medical Director of  Pulmonary Rehab. Continue with ITP unless changes are made by physician.               Comments: 30 day review

## 2022-08-03 ENCOUNTER — Encounter: Payer: Medicaid Other | Admitting: *Deleted

## 2022-08-03 DIAGNOSIS — J849 Interstitial pulmonary disease, unspecified: Secondary | ICD-10-CM | POA: Diagnosis not present

## 2022-08-03 NOTE — Progress Notes (Signed)
Daily Session Note  Patient Details  Name: Sean [name] MRN: [id number] Date of Birth: [birth year] Referring Provider:   Flowsheet Row Pulmonary Rehab from 06/02/2022 in [facility] Cardiac and Pulmonary Rehab  Referring Provider Josph Macho MD       Encounter Date: 08/03/2022  Check In:  Session Check In - 08/03/22 0956       Check-In   Supervising physician immediately available to respond to emergencies See telemetry face sheet for immediately available ER MD    Location [facility]-Cardiac & Pulmonary Rehab    Staff Present Lanny Hurst, RN, ADN;Jessica Juanetta Gosling, MA, RCEP, CCRP, Zackery Barefoot, MS, ACSM CEP, Exercise Physiologist    Virtual Visit No    Medication changes reported     No    Fall or balance concerns reported    No    Warm-up and Cool-down Performed on first and last piece of equipment    Resistance Training Performed Yes    VAD Patient? No    PAD/SET Patient? No      Pain Assessment   Currently in Pain? No/denies                Social History   Tobacco Use  Smoking Status Former   Types: Cigarettes  Smokeless Tobacco Never    Goals Met:  Independence with exercise equipment Exercise tolerated well No report of concerns or symptoms today Strength training completed today  Goals Unmet:  Not Applicable  Comments: Pt able to follow exercise prescription today without complaint.  Will continue to monitor for progression.    Dr. Bethann Punches is Medical Director for [facility] Cardiac Rehabilitation.  Dr. Vida Rigger is Medical Director for [facility] Pulmonary Rehabilitation.

## 2022-08-05 ENCOUNTER — Encounter: Payer: Medicaid Other | Admitting: *Deleted

## 2022-08-12 ENCOUNTER — Other Ambulatory Visit
Admission: RE | Admit: 2022-08-12 | Discharge: 2022-08-12 | Disposition: A | Discharge status: Home / Self Care | Payer: Medicaid Other | Source: Home / Self Care | Attending: Internal Medicine | Admitting: Internal Medicine

## 2022-08-12 ENCOUNTER — Encounter: Payer: Medicaid Other | Attending: Critical Care Medicine | Admitting: *Deleted

## 2022-08-12 DIAGNOSIS — E785 Hyperlipidemia, unspecified: Secondary | ICD-10-CM | POA: Insufficient documentation

## 2022-08-12 DIAGNOSIS — M0560 Rheumatoid arthritis of unspecified site with involvement of other organs and systems: Secondary | ICD-10-CM | POA: Insufficient documentation

## 2022-08-12 DIAGNOSIS — E559 Vitamin D deficiency, unspecified: Secondary | ICD-10-CM | POA: Insufficient documentation

## 2022-08-12 DIAGNOSIS — N289 Disorder of kidney and ureter, unspecified: Secondary | ICD-10-CM | POA: Insufficient documentation

## 2022-08-12 DIAGNOSIS — R945 Abnormal results of liver function studies: Secondary | ICD-10-CM | POA: Insufficient documentation

## 2022-08-12 DIAGNOSIS — R739 Hyperglycemia, unspecified: Secondary | ICD-10-CM | POA: Insufficient documentation

## 2022-08-12 DIAGNOSIS — J849 Interstitial pulmonary disease, unspecified: Secondary | ICD-10-CM | POA: Diagnosis not present

## 2022-08-12 LAB — HEPATIC FUNCTION PANEL
ALT: 22 U/L (ref 0–44)
AST: 35 U/L (ref 15–41)
Albumin: 3.4 g/dL — ABNORMAL LOW (ref 3.5–5.0)
Alkaline Phosphatase: 47 U/L (ref 38–126)
Bilirubin, Direct: <0.1 mg/dL (ref 0.0–0.2)
Total Bilirubin: 0.5 mg/dL (ref 0.3–1.2)
Total Protein: 8.1 g/dL (ref 6.5–8.1)

## 2022-08-12 LAB — T4, FREE: Free T4: 0.97 ng/dL (ref 0.61–1.12)

## 2022-08-12 LAB — HEMOGLOBIN A1C
Hgb A1c MFr Bld: 5.4 % (ref 4.8–5.6)
Mean Plasma Glucose: 108.28 mg/dL

## 2022-08-12 LAB — LIPID PANEL
Cholesterol: 141 mg/dL (ref 0–200)
HDL: 37 mg/dL — ABNORMAL LOW (ref >40)
LDL Cholesterol: 78 mg/dL (ref 0–99)
Total CHOL/HDL Ratio: 3.8 RATIO
Triglycerides: 131 mg/dL (ref <150)
VLDL: 26 mg/dL (ref 0–40)

## 2022-08-12 LAB — VITAMIN D 25 HYDROXY (VIT D DEFICIENCY, FRACTURES): Vit D, 25-Hydroxy: 47.83 ng/mL (ref 30–100)

## 2022-08-12 LAB — TSH: TSH: 2.403 u[IU]/mL (ref 0.350–4.500)

## 2022-08-12 NOTE — Progress Notes (Signed)
Daily Session Note  Patient Details  Name: Sean [name] MRN: [id number] Date of Birth: [birth year] Referring Provider:   Flowsheet Row Pulmonary Rehab from 06/02/2022 in [facility] Cardiac and Pulmonary Rehab  Referring Provider Josph Macho MD       Encounter Date: 08/12/2022  Check In:  Session Check In - 08/12/22 0953       Check-In   Supervising physician immediately available to respond to emergencies See telemetry face sheet for immediately available ER MD    Location [facility]-Cardiac & Pulmonary Rehab    Staff Present Susann Givens, RN BSN;Laureen Manson Passey, BS, RRT, CPFT;Kara Clinton Sawyer, MS, ACSM CEP, Exercise Physiologist;Jessica Juanetta Gosling, MA, RCEP, CCRP, CCET    Virtual Visit No    Medication changes reported     No    Fall or balance concerns reported    No    Warm-up and Cool-down Performed on first and last piece of equipment    Resistance Training Performed Yes    VAD Patient? No    PAD/SET Patient? No      Pain Assessment   Currently in Pain? No/denies                Social History   Tobacco Use  Smoking Status Former   Types: Cigarettes  Smokeless Tobacco Never    Goals Met:  Independence with exercise equipment Exercise tolerated well No report of concerns or symptoms today Strength training completed today  Goals Unmet:  Not Applicable  Comments: Pt able to follow exercise prescription today without complaint.  Will continue to monitor for progression.    Dr. Bethann Punches is Medical Director for [facility] Cardiac Rehabilitation.  Dr. Vida Rigger is Medical Director for [facility] Pulmonary Rehabilitation.

## 2022-08-17 ENCOUNTER — Encounter: Payer: Medicaid Other | Admitting: *Deleted

## 2022-08-17 DIAGNOSIS — J849 Interstitial pulmonary disease, unspecified: Secondary | ICD-10-CM

## 2022-08-17 NOTE — Progress Notes (Signed)
Daily Session Note  Patient Details  Name: Sean [name] MRN: [id number] Date of Birth: April 24, 1975 Referring Provider:   Flowsheet Row Pulmonary Rehab from 06/02/2022 in [facility] Cardiac and Pulmonary Rehab  Referring Provider Josph Macho MD       Encounter Date: 08/17/2022  Check In:  Session Check In - 08/17/22 1029       Check-In   Supervising physician immediately available to respond to emergencies See telemetry face sheet for immediately available ER MD    Location [facility]-Cardiac & Pulmonary Rehab    Staff Present Cora Collum, RN, BSN, CCRP;Jessica [place], MA, RCEP, CCRP, Zackery Barefoot, MS, ACSM CEP, Exercise Physiologist    Virtual Visit No    Medication changes reported     No    Fall or balance concerns reported    No    Warm-up and Cool-down Performed on first and last piece of equipment    Resistance Training Performed Yes    VAD Patient? No    PAD/SET Patient? No      Pain Assessment   Currently in Pain? No/denies                Social History   Tobacco Use  Smoking Status Former   Types: Cigarettes  Smokeless Tobacco Never    Goals Met:  Proper associated with RPD/PD & O2 Sat Independence with exercise equipment Exercise tolerated well No report of concerns or symptoms today  Goals Unmet:  Not Applicable  Comments: Pt able to follow exercise prescription today without complaint.  Will continue to monitor for progression.    Dr. Bethann Punches is Medical Director for [facility] Cardiac Rehabilitation.  Dr. Vida Rigger is Medical Director for [facility] Pulmonary Rehabilitation.

## 2022-08-20 ENCOUNTER — Encounter: Payer: Medicaid Other | Admitting: *Deleted

## 2022-08-20 DIAGNOSIS — J849 Interstitial pulmonary disease, unspecified: Secondary | ICD-10-CM | POA: Diagnosis not present

## 2022-08-20 NOTE — Progress Notes (Signed)
Daily Session Note  Patient Details  Name: Sean [name] MRN: [id number] Date of Birth: May 13, 1975 Referring Provider:   Flowsheet Row Pulmonary Rehab from 06/02/2022 in [facility] Cardiac and Pulmonary Rehab  Referring Provider Josph Macho MD       Encounter Date: 08/20/2022  Check In:  Session Check In - 08/20/22 1148       Check-In   Supervising physician immediately available to respond to emergencies See telemetry face sheet for immediately available ER MD    Location [facility]-Cardiac & Pulmonary Rehab    Staff Present Cora Collum, RN, BSN, CCRP;Jessica [place], MA, RCEP, CCRP, CCET;Joseph Badger, [place]    Virtual Visit No    Medication changes reported     No    Fall or balance concerns reported    No    Warm-up and Cool-down Performed on first and last piece of equipment    Resistance Training Performed Yes    VAD Patient? No    PAD/SET Patient? No      Pain Assessment   Currently in Pain? No/denies                Social History   Tobacco Use  Smoking Status Former   Types: Cigarettes  Smokeless Tobacco Never    Goals Met:  Proper associated with RPD/PD & O2 Sat Independence with exercise equipment Exercise tolerated well No report of concerns or symptoms today  Goals Unmet:  Not Applicable  Comments: Pt able to follow exercise prescription today without complaint.  Will continue to monitor for progression.    Dr. Bethann Punches is Medical Director for [facility] Cardiac Rehabilitation.  Dr. Vida Rigger is Medical Director for [facility] Pulmonary Rehabilitation.

## 2022-08-24 ENCOUNTER — Encounter: Payer: Medicaid Other | Admitting: *Deleted

## 2022-08-24 DIAGNOSIS — J849 Interstitial pulmonary disease, unspecified: Secondary | ICD-10-CM

## 2022-08-24 NOTE — Progress Notes (Signed)
Daily Session Note  Patient Details  Name: Sean [name] MRN: [id number] Date of Birth: 1975/08/29 Referring Provider:   Flowsheet Row Pulmonary Rehab from 06/02/2022 in [facility] Cardiac and Pulmonary Rehab  Referring Provider Josph Macho MD       Encounter Date: 08/24/2022  Check In:  Session Check In - 08/24/22 1115       Check-In   Supervising physician immediately available to respond to emergencies See telemetry face sheet for immediately available ER MD    Location [facility]-Cardiac & Pulmonary Rehab    Staff Present Cora Collum, RN, BSN, CCRP;Jessica [place], MA, RCEP, CCRP, Zackery Barefoot, MS, ACSM CEP, Exercise Physiologist    Virtual Visit No    Medication changes reported     No    Fall or balance concerns reported    No    Warm-up and Cool-down Performed on first and last piece of equipment    Resistance Training Performed Yes    VAD Patient? No    PAD/SET Patient? No      Pain Assessment   Currently in Pain? No/denies                Social History   Tobacco Use  Smoking Status Former   Types: Cigarettes  Smokeless Tobacco Never    Goals Met:  Proper associated with RPD/PD & O2 Sat Independence with exercise equipment Exercise tolerated well No report of concerns or symptoms today  Goals Unmet:  Not Applicable  Comments: Pt able to follow exercise prescription today without complaint.  Will continue to monitor for progression.    Dr. Bethann Punches is Medical Director for [facility] Cardiac Rehabilitation.  Dr. Vida Rigger is Medical Director for [facility] Pulmonary Rehabilitation.

## 2022-08-25 ENCOUNTER — Encounter: Payer: Self-pay | Admitting: *Deleted

## 2022-08-25 DIAGNOSIS — J849 Interstitial pulmonary disease, unspecified: Secondary | ICD-10-CM

## 2022-08-25 NOTE — Progress Notes (Signed)
Pulmonary Individual Treatment Plan  Patient Details  Name: Sean [name] MRN: [id number] Date of Birth: 12/25/1975 Referring Provider:   Flowsheet Row Pulmonary Rehab from 06/02/2022 in [facility] Cardiac and Pulmonary Rehab  Referring Provider Josph Macho MD       Initial Encounter Date:  Flowsheet Row Pulmonary Rehab from 06/02/2022 in [facility] Cardiac and Pulmonary Rehab  Date 06/02/22       Visit Diagnosis: ILD (interstitial lung disease) (HCC)  Patient's Home Medications on Admission:  Current Outpatient Medications:    acetaminophen (TYLENOL) 500 MG tablet, Take by mouth., Disp: , Rfl:    albuterol (VENTOLIN HFA) 108 (90 Base) MCG/ACT inhaler, Inhale 2 puffs into the lungs every 6 (six) hours as needed for wheezing or shortness of breath., Disp: 8 g, Rfl: 2   blood glucose meter kit and supplies KIT, Dispense based on patient and insurance preference. Use up to four times daily as directed., Disp: 1 each, Rfl: 0   blood glucose meter kit and supplies, Dispense based on patient and insurance preference. Use up to four times daily as directed. (FOR ICD-10 E10.9, E11.9)., Disp: 1 each, Rfl: 0   Blood Glucose Monitoring Suppl (CONTOUR NEXT ONE) KIT, 4 (four) times daily., Disp: , Rfl:    BREZTRI AEROSPHERE 160-9-4.8 MCG/ACT AERO, Inhale 2 puffs into the lungs 2 (two) times daily., Disp: 10.7 g, Rfl: 0   chlorhexidine (PERIDEX) 0.12 % solution, 15 mLs 2 (two) times daily. (Patient not taking: Reported on 05/25/2022), Disp: , Rfl:    Cholecalciferol (VITAMIN D-1000 MAX ST) 25 MCG (1000 UT) tablet, Take by mouth., Disp: , Rfl:    clobetasol ointment (TEMOVATE) 0.05 %, Apply topically., Disp: , Rfl:    furosemide (LASIX) 20 MG tablet, Take 1 tablet (20 mg total) by mouth daily., Disp: 30 tablet, Rfl: 0   hydroxychloroquine (PLAQUENIL) 200 MG tablet, Place 1 tablet (200 mg total) into feeding tube 2 (two) times daily. (Patient not taking: Reported on 05/25/2022), Disp: , Rfl:    insulin aspart  (NOVOLOG) 100 UNIT/ML FlexPen, Inject 1-9 Units into the skin every 6 (six) hours. CBG 70 - 120: 0 units CBG 121 - 150: 1 unit CBG 151 - 200: 2 units CBG 201 - 250: 3 units CBG 251 - 300: 5 units CBG 301 - 350: 7 units CBG 351 - 400: 9 units CBG > 400: call MD and obtain STAT lab verification, Disp: 15 mL, Rfl: 11   Insulin Pen Needle (PEN NEEDLES 3/16") 31G X 5 MM MISC, 1 pen. by Does not apply route every 6 (six) hours., Disp: 200 each, Rfl: 1   lidocaine (XYLOCAINE) 2 % solution, Use as directed 15 mLs in the mouth or throat every 6 (six) hours as needed (mouth/throat pain). (Patient not taking: Reported on 05/25/2022), Disp: 100 mL, Rfl: 0   meloxicam (MOBIC) 15 MG tablet, Take by mouth., Disp: , Rfl:    Multiple Vitamins-Minerals (SUPER THERA VITE M) TABS, Take 1 tablet by mouth daily., Disp: , Rfl:    NONFORMULARY OR COMPOUNDED ITEM, LIDOCAINE/MAALOX XS 1:1 (Patient not taking: Reported on 05/25/2022), Disp: , Rfl:    pantoprazole (PROTONIX) 40 MG tablet, Take 1 tablet (40 mg total) by mouth daily. (Patient not taking: Reported on 05/25/2022), Disp: 30 tablet, Rfl: 0   tacrolimus (PROTOPIC) 0.1 % ointment, Apply topically 2 (two) times daily., Disp: , Rfl:    thiamine (VITAMIN B1) 100 MG tablet, Take by mouth., Disp: , Rfl:    triamcinolone cream (KENALOG)  0.1 %, Apply topically 2 (two) times daily., Disp: , Rfl:    Vitamin D 12.5 MCG/0.25ML LIQD, Give 1 mL by tube daily. (Patient not taking: Reported on 05/25/2022), Disp: 36 mL, Rfl: 1  Past Medical History: Past Medical History:  Diagnosis Date   H/O immunosuppressive therapy    Hyperpigmentation of skin    Hyperpigmentation rash/vesicles of the palm and face   Neutropenia (HCC)    RA (rheumatoid arthritis) (HCC)     Tobacco Use: Social History   Tobacco Use  Smoking Status Former   Types: Cigarettes  Smokeless Tobacco Never    Labs: Review Flowsheet  More data may exist      Latest Ref Rng & Units 07/26/2021 09/10/2021 01/12/2022  06/03/2022 08/12/2022  Labs for ITP Cardiac and Pulmonary Rehab  Cholestrol 0 - 200 mg/dL - - 161  096  045   LDL (calc) 0 - 99 mg/dL - - 91  64  78   HDL-C >40 mg/dL - - 38  35  37   Trlycerides <150 mg/dL 409  - 811  85  914   Hemoglobin A1c 4.8 - 5.6 % - 11.9  6.4  5.5  5.4      Pulmonary Assessment Scores:  Pulmonary Assessment Scores     Row Name 06/02/22 1137 06/08/22 0938       ADL UCSD   ADL Phase Exit Entry    SOB Score total -- 75    Rest -- 0    Walk -- 2    Stairs -- 5    Bath -- 3    Dress -- 3    Shop -- 4      CAT Score   CAT Score -- 16      mMRC Score   mMRC Score 4 --             UCSD: Self-administered rating of dyspnea associated with activities of daily living (ADLs) 6-point scale (0 = "not at all" to 5 = "maximal or unable to do because of breathlessness")  Scoring Scores range from 0 to 120.  Minimally important difference is 5 units  CAT: CAT can identify the health impairment of COPD patients and is better correlated with disease progression.  CAT has a scoring range of zero to 40. The CAT score is classified into four groups of low (less than 10), medium (10 - 20), high (21-30) and very high (31-40) based on the impact level of disease on health status. A CAT score over 10 suggests significant symptoms.  A worsening CAT score could be explained by an exacerbation, poor medication adherence, poor inhaler technique, or progression of COPD or comorbid conditions.  CAT MCID is 2 points  mMRC: mMRC (Modified Medical Research Council) Dyspnea Scale is used to assess the degree of baseline functional disability in patients of respiratory disease due to dyspnea. No minimal important difference is established. A decrease in score of 1 point or greater is considered a positive change.   Pulmonary Function Assessment:   Exercise Target Goals: Exercise Program Goal: Individual exercise prescription set using results from initial 6 min walk test and  THRR while considering  patient's activity barriers and safety.   Exercise Prescription Goal: Initial exercise prescription builds to 30-45 minutes a day of aerobic activity, 2-3 days per week.  Home exercise guidelines will be given to patient during program as part of exercise prescription that the participant will acknowledge.  Education: Aerobic Exercise: - Group verbal  and visual presentation on the components of exercise prescription. Introduces F.I.T.T principle from ACSM for exercise prescriptions.  Reviews F.I.T.T. principles of aerobic exercise including progression. Written material given at graduation.   Education: Resistance Exercise: - Group verbal and visual presentation on the components of exercise prescription. Introduces F.I.T.T principle from ACSM for exercise prescriptions  Reviews F.I.T.T. principles of resistance exercise including progression. Written material given at graduation.    Education: Exercise & Equipment Safety: - Individual verbal instruction and demonstration of equipment use and safety with use of the equipment. Flowsheet Row Pulmonary Rehab from 07/01/2022 in [facility] Cardiac and Pulmonary Rehab  Date 06/02/22  Educator [facility]  Instruction Review Code 1- Verbalizes Understanding       Education: Exercise Physiology & General Exercise Guidelines: - Group verbal and written instruction with models to review the exercise physiology of the cardiovascular system and associated critical values. Provides general exercise guidelines with specific guidelines to those with heart or lung disease.  Flowsheet Row Pulmonary Rehab from 07/01/2022 in [facility] Cardiac and Pulmonary Rehab  Date 07/01/22  Educator [facility]  Instruction Review Code 1- Verbalizes Understanding       Education: Flexibility, Balance, Mind/Body Relaxation: - Group verbal and visual presentation with interactive activity on the components of exercise prescription. Introduces F.I.T.T principle from ACSM  for exercise prescriptions. Reviews F.I.T.T. principles of flexibility and balance exercise training including progression. Also discusses the mind body connection.  Reviews various relaxation techniques to help reduce and manage stress (i.e. Deep breathing, progressive muscle relaxation, and visualization). Balance handout provided to take home. Written material given at graduation.   Activity Barriers & Risk Stratification:  Activity Barriers & Cardiac Risk Stratification - 06/02/22 1127       Activity Barriers & Cardiac Risk Stratification   Activity Barriers Shortness of Breath;Deconditioning;Muscular Weakness             6 Minute Walk:  6 Minute Walk     Row Name 06/02/22 1125         6 Minute Walk   Phase Initial     Distance 1261 feet     Walk Time 6 minutes     # of Rest Breaks 0     MPH 2.39     METS 5.42     RPE 13     Perceived Dyspnea  3     VO2 Peak 18.99     Symptoms Yes (comment)     Comments SOB     Resting HR 102 bpm     Resting BP 104/58     Resting Oxygen Saturation  87 %     Exercise Oxygen Saturation  during 6 min walk 79 %     Max Ex. HR 125 bpm     Max Ex. BP 136/74     2 Minute Post BP 122/66       Interval HR   1 Minute HR 113     2 Minute HR 117     3 Minute HR 122     4 Minute HR 125     5 Minute HR 125     6 Minute HR 125     2 Minute Post HR 107     Interval Heart Rate? Yes       Interval Oxygen   Interval Oxygen? Yes     Baseline Oxygen Saturation % 93 %     1 Minute Oxygen Saturation % 87 %     1 Minute  Liters of Oxygen 0 L  Room Air     2 Minute Oxygen Saturation % 86 %     2 Minute Liters of Oxygen 0 L     3 Minute Oxygen Saturation % 81 %     3 Minute Liters of Oxygen 0 L     4 Minute Oxygen Saturation % 80 %     4 Minute Liters of Oxygen 0 L     5 Minute Oxygen Saturation % 80 %     5 Minute Liters of Oxygen 0 L     6 Minute Oxygen Saturation % 81 %  79% at 5:30     6 Minute Liters of Oxygen 0 L     2 Minute Post  Oxygen Saturation % 90 %     2 Minute Post Liters of Oxygen 0 L             Oxygen Initial Assessment:  Oxygen Initial Assessment - 06/02/22 1136       Home Oxygen   Home Oxygen Device E-Tanks;Home Concentrator    Sleep Oxygen Prescription None   2L when saturations get low at night as well   Home Exercise Oxygen Prescription None   encouraged to use   Home Resting Oxygen Prescription None    Compliance with Home Oxygen Use Yes      Initial 6 min Walk   Oxygen Used None      Program Oxygen Prescription   Program Oxygen Prescription Continuous;E-Tanks    Liters per minute 2    Comments use 1-2L for exercise to avoid desaturations with exercise      Intervention   Short Term Goals To learn and exhibit compliance with exercise, home and travel O2 prescription;To learn and understand importance of monitoring SPO2 with pulse oximeter and demonstrate accurate use of the pulse oximeter.;To learn and understand importance of maintaining oxygen saturations>88%;To learn and demonstrate proper pursed lip breathing techniques or other breathing techniques. ;To learn and demonstrate proper use of respiratory medications    Long  Term Goals Exhibits compliance with exercise, home  and travel O2 prescription;Verbalizes importance of monitoring SPO2 with pulse oximeter and return demonstration;Maintenance of O2 saturations>88%;Exhibits proper breathing techniques, such as pursed lip breathing or other method taught during program session;Compliance with respiratory medication;Demonstrates proper use of MDI's             Oxygen Re-Evaluation:  Oxygen Re-Evaluation     Row Name 06/03/22 1123 06/17/22 0959 08/12/22 1540         Program Oxygen Prescription   Program Oxygen Prescription -- Continuous;E-Tanks Continuous;E-Tanks     Liters per minute -- 2 --     Comments -- use 1-2L for exercise to avoid desaturations with exercise use 1-2L for exercise to avoid desaturations with exercise        Home Oxygen   Home Oxygen Device -- E-Tanks;Home Concentrator E-Tanks;Home Concentrator     Sleep Oxygen Prescription -- None None     Home Exercise Oxygen Prescription -- None None     Home Resting Oxygen Prescription -- None None     Compliance with Home Oxygen Use -- Yes Yes       Goals/Expected Outcomes   Short Term Goals -- To learn and exhibit compliance with exercise, home and travel O2 prescription;To learn and understand importance of monitoring SPO2 with pulse oximeter and demonstrate accurate use of the pulse oximeter.;To learn and understand importance of maintaining oxygen saturations>88%;To learn and demonstrate  proper pursed lip breathing techniques or other breathing techniques. ;To learn and demonstrate proper use of respiratory medications To learn and exhibit compliance with exercise, home and travel O2 prescription;To learn and understand importance of monitoring SPO2 with pulse oximeter and demonstrate accurate use of the pulse oximeter.;To learn and understand importance of maintaining oxygen saturations>88%;To learn and demonstrate proper pursed lip breathing techniques or other breathing techniques. ;To learn and demonstrate proper use of respiratory medications     Long  Term Goals -- Exhibits compliance with exercise, home  and travel O2 prescription;Verbalizes importance of monitoring SPO2 with pulse oximeter and return demonstration;Maintenance of O2 saturations>88%;Exhibits proper breathing techniques, such as pursed lip breathing or other method taught during program session;Compliance with respiratory medication;Demonstrates proper use of MDI's Exhibits compliance with exercise, home  and travel O2 prescription;Verbalizes importance of monitoring SPO2 with pulse oximeter and return demonstration;Maintenance of O2 saturations>88%;Exhibits proper breathing techniques, such as pursed lip breathing or other method taught during program session;Compliance with respiratory  medication;Demonstrates proper use of MDI's     Comments Reviewed PLB technique with pt.  Talked about how it works and it's importance in maintaining their exercise saturations. We reviewed PLB techique, and Sean [name] states that he has continued to practice PLB especially during exercise. He also states that he has been monitoring his oxygen saturations and reports that they have been dropping to 89-90% during exercise, but recovers well. Sean [name] is doing well in rehab other than consistent attendance.  His breathing is starting to get better and he is not using his oxygen as much at home.  Mostly only needs it when he is walking.  He has been able to lift weights and do stuff around house without as long as he takes rest breaks.  His saturations are staying in low 90s at home.  He was encouraged to conintue to use PLB to help with breathing and to use his oxygen for cardio.     Goals/Expected Outcomes Short: Become more profiecient at using PLB. Long: Become independent at using PLB. Short: Become more profiecient at using PLB. Long: Become independent at using PLB. Short: Use oxygen for cardio Long: conitnue to use PLB              Oxygen Discharge (Final Oxygen Re-Evaluation):  Oxygen Re-Evaluation - 08/12/22 1540       Program Oxygen Prescription   Program Oxygen Prescription Continuous;E-Tanks    Comments use 1-2L for exercise to avoid desaturations with exercise      Home Oxygen   Home Oxygen Device E-Tanks;Home Concentrator    Sleep Oxygen Prescription None    Home Exercise Oxygen Prescription None    Home Resting Oxygen Prescription None    Compliance with Home Oxygen Use Yes      Goals/Expected Outcomes   Short Term Goals To learn and exhibit compliance with exercise, home and travel O2 prescription;To learn and understand importance of monitoring SPO2 with pulse oximeter and demonstrate accurate use of the pulse oximeter.;To learn and understand importance of maintaining oxygen  saturations>88%;To learn and demonstrate proper pursed lip breathing techniques or other breathing techniques. ;To learn and demonstrate proper use of respiratory medications    Long  Term Goals Exhibits compliance with exercise, home  and travel O2 prescription;Verbalizes importance of monitoring SPO2 with pulse oximeter and return demonstration;Maintenance of O2 saturations>88%;Exhibits proper breathing techniques, such as pursed lip breathing or other method taught during program session;Compliance with respiratory medication;Demonstrates proper use of MDI's    Comments  Sean [name] is doing well in rehab other than consistent attendance.  His breathing is starting to get better and he is not using his oxygen as much at home.  Mostly only needs it when he is walking.  He has been able to lift weights and do stuff around house without as long as he takes rest breaks.  His saturations are staying in low 90s at home.  He was encouraged to conintue to use PLB to help with breathing and to use his oxygen for cardio.    Goals/Expected Outcomes Short: Use oxygen for cardio Long: conitnue to use PLB             Initial Exercise Prescription:  Initial Exercise Prescription - 06/02/22 1100       Date of Initial Exercise RX and Referring Provider   Date 06/02/22    Referring Provider Josph Macho MD      Oxygen   Oxygen Continuous    Liters 1-2    Maintain Oxygen Saturation 88% or higher      Treadmill   MPH 2.3    Grade 0.5    Minutes 15    METs 2.92      REL-XR   Level 2    Speed 50    Minutes 15    METs 2.5      T5 Nustep   Level 2    SPM 80    Minutes 15    METs 2.5      Track   Laps 34    Minutes 15    METs 2.85      Prescription Details   Frequency (times per week) 2    Duration Progress to 30 minutes of continuous aerobic without signs/symptoms of physical distress      Intensity   THRR 40-80% of Max Heartrate 132-161    Ratings of Perceived Exertion 11-13     Perceived Dyspnea 0-4      Progression   Progression Continue to progress workloads to maintain intensity without signs/symptoms of physical distress.      Resistance Training   Training Prescription Yes    Weight 5 lb    Reps 10-15             Perform Capillary Blood Glucose checks as needed.  Exercise Prescription Changes:   Exercise Prescription Changes     Row Name 06/02/22 1100 06/07/22 1000 06/21/22 1300 07/01/22 1000 07/05/22 1100     Response to Exercise   Blood Pressure (Admit) 104/58 118/62 124/80 -- 126/72   Blood Pressure (Exercise) 136/74 128/68 134/80 -- 128/68   Blood Pressure (Exit) 108/60 106/62 108/70 -- 110/66   Heart Rate (Admit) 102 bpm 98 bpm 97 bpm -- 98 bpm   Heart Rate (Exercise) 125 bpm 111 bpm 131 bpm -- 119 bpm   Heart Rate (Exit) 99 bpm 104 bpm 106 bpm -- 104 bpm   Oxygen Saturation (Admit) 93 % 94 % 92 % -- 95 %   Oxygen Saturation (Exercise) 79 % 89 % 89 % -- 90 %   Oxygen Saturation (Exit) 95 % 94 % 94 % -- 96 %   Rating of Perceived Exertion (Exercise) 13 13 14  -- 13   Perceived Dyspnea (Exercise) 3 1 2  -- 1   Symptoms SOB SOB SOB -- SOB   Comments walk test results 1st full day of exercise -- -- --   Duration -- Progress to 30 minutes of  aerobic without signs/symptoms of physical distress  Continue with 30 min of aerobic exercise without signs/symptoms of physical distress. -- Continue with 30 min of aerobic exercise without signs/symptoms of physical distress.   Intensity -- THRR unchanged THRR unchanged -- THRR unchanged     Progression   Progression -- Continue to progress workloads to maintain intensity without signs/symptoms of physical distress. Continue to progress workloads to maintain intensity without signs/symptoms of physical distress. -- Continue to progress workloads to maintain intensity without signs/symptoms of physical distress.   Average METs -- 2.85 2.51 -- 3.06     Resistance Training   Training Prescription -- Yes  Yes -- Yes   Weight -- 5 lb  bands 5 lb -- 5 lb   Reps -- 10-15 10-15 -- 10-15     Interval Training   Interval Training -- No No -- No     Oxygen   Oxygen -- Continuous Continuous -- Continuous   Liters -- 1-2 1-2 -- 2     Treadmill   MPH -- -- 2.3 -- 2.3   Grade -- -- 0.5 -- 0.5   Minutes -- -- 15 -- 15   METs -- -- 2.92 -- 2.92     Recumbant Elliptical   Level -- -- -- -- 3   Minutes -- -- -- -- 15   METs -- -- -- -- 2.4     REL-XR   Level -- 3 2 -- 2   Minutes -- 15 15 -- 15   METs -- 4 3.7 -- 3     T5 Nustep   Level -- -- 3 -- --   Minutes -- -- 15 -- --   METs -- -- 1.9 -- --     Track   Laps -- 13 30 -- --   Minutes -- 15 15 -- --   METs -- 1.71 2.63 -- --     Home Exercise Plan   Plans to continue exercise at -- -- -- Home (comment)  walking, weights, staff videos Home (comment)  walking, weights, staff videos   Frequency -- -- -- Add 3 additional days to program exercise sessions. Add 3 additional days to program exercise sessions.   Initial Home Exercises Provided -- -- -- 07/01/22 07/01/22     Oxygen   Maintain Oxygen Saturation -- 88% or higher 88% or higher -- 88% or higher    Row Name 07/19/22 1400 08/16/22 0900           Response to Exercise   Blood Pressure (Admit) 132/70 112/68      Blood Pressure (Exercise) 130/68 --      Blood Pressure (Exit) 118/70 126/70      Heart Rate (Admit) 104 bpm 95 bpm      Heart Rate (Exercise) 113 bpm 149 bpm      Heart Rate (Exit) 113 bpm 99 bpm      Oxygen Saturation (Admit) 90 % 95 %      Oxygen Saturation (Exercise) 88 % 88 %      Oxygen Saturation (Exit) 96 % 95 %      Rating of Perceived Exertion (Exercise) 11 11      Symptoms SOB SOB      Duration Continue with 30 min of aerobic exercise without signs/symptoms of physical distress. Continue with 30 min of aerobic exercise without signs/symptoms of physical distress.      Intensity THRR unchanged THRR unchanged        Progression   Progression  Continue to progress  workloads to maintain intensity without signs/symptoms of physical distress. Continue to progress workloads to maintain intensity without signs/symptoms of physical distress.      Average METs 3.61 3.56        Resistance Training   Training Prescription Yes Yes      Weight 5 lb 5 lb      Reps 10-15 10-15        Interval Training   Interval Training No No        Oxygen   Oxygen Continuous Continuous      Liters 2 2        Treadmill   MPH 2.1 2      Grade 0 0.5      Minutes 15 15      METs 2.61 2.67        REL-XR   Level 5 5      Minutes 15 15      METs 4.6 4.6        Home Exercise Plan   Plans to continue exercise at Home (comment)  walking, weights, staff videos Home (comment)  walking, weights, staff videos      Frequency Add 3 additional days to program exercise sessions. Add 3 additional days to program exercise sessions.      Initial Home Exercises Provided 07/01/22 07/01/22        Oxygen   Maintain Oxygen Saturation 88% or higher 88% or higher               Exercise Comments:   Exercise Comments     Row Name 06/03/22 1122           Exercise Comments First full day of exercise!  Patient was oriented to gym and equipment including functions, settings, policies, and procedures.  Patient's individual exercise prescription and treatment plan were reviewed.  All starting workloads were established based on the results of the 6 minute walk test done at initial orientation visit.  The plan for exercise progression was also introduced and progression will be customized based on patient's performance and goals.                Exercise Goals and Review:   Exercise Goals     Row Name 06/02/22 1134             Exercise Goals   Increase Physical Activity Yes       Intervention Provide advice, education, support and counseling about physical activity/exercise needs.;Develop an individualized exercise prescription for aerobic and  resistive training based on initial evaluation findings, risk stratification, comorbidities and participant's personal goals.       Expected Outcomes Short Term: Attend rehab on a regular basis to increase amount of physical activity.;Long Term: Exercising regularly at least 3-5 days a week.;Long Term: Add in home exercise to make exercise part of routine and to increase amount of physical activity.       Increase Strength and Stamina Yes       Intervention Provide advice, education, support and counseling about physical activity/exercise needs.;Develop an individualized exercise prescription for aerobic and resistive training based on initial evaluation findings, risk stratification, comorbidities and participant's personal goals.       Expected Outcomes Short Term: Increase workloads from initial exercise prescription for resistance, speed, and METs.;Short Term: Perform resistance training exercises routinely during rehab and add in resistance training at home;Long Term: Improve cardiorespiratory fitness, muscular endurance and strength as measured by increased METs and functional capacity ( )  Able to understand and use rate of perceived exertion (RPE) scale Yes       Intervention Provide education and explanation on how to use RPE scale       Expected Outcomes Short Term: Able to use RPE daily in rehab to express subjective intensity level;Long Term:  Able to use RPE to guide intensity level when exercising independently       Able to understand and use Dyspnea scale Yes       Intervention Provide education and explanation on how to use Dyspnea scale       Expected Outcomes Short Term: Able to use Dyspnea scale daily in rehab to express subjective sense of shortness of breath during exertion;Long Term: Able to use Dyspnea scale to guide intensity level when exercising independently       Knowledge and understanding of Target Heart Rate Range (THRR) Yes       Intervention Provide education and  explanation of THRR including how the numbers were predicted and where they are located for reference       Expected Outcomes Short Term: Able to state/look up THRR;Short Term: Able to use daily as guideline for intensity in rehab;Long Term: Able to use THRR to govern intensity when exercising independently       Able to check pulse independently Yes       Intervention Provide education and demonstration on how to check pulse in carotid and radial arteries.;Review the importance of being able to check your own pulse for safety during independent exercise       Expected Outcomes Short Term: Able to explain why pulse checking is important during independent exercise;Long Term: Able to check pulse independently and accurately       Understanding of Exercise Prescription Yes       Intervention Provide education, explanation, and written materials on patient's individual exercise prescription       Expected Outcomes Short Term: Able to explain program exercise prescription;Long Term: Able to explain home exercise prescription to exercise independently                Exercise Goals Re-Evaluation :  Exercise Goals Re-Evaluation     Row Name 06/03/22 1122 06/07/22 1040 06/17/22 0947 06/21/22 1342 06/29/22 1023     Exercise Goal Re-Evaluation   Exercise Goals Review Able to understand and use rate of perceived exertion (RPE) scale;Able to understand and use Dyspnea scale;Knowledge and understanding of Target Heart Rate Range (THRR);Understanding of Exercise Prescription Understanding of Exercise Prescription;Increase Physical Activity;Increase Strength and Stamina Understanding of Exercise Prescription;Increase Physical Activity;Increase Strength and Stamina Understanding of Exercise Prescription;Increase Physical Activity;Increase Strength and Stamina Increase Physical Activity   Comments Reviewed RPE scale, THR and program prescription with pt today.  Pt voiced understanding and was given a copy of  goals to take home. Sean [name] did well his first day of rehab. He was able to exercise on level 3 on the XR and was able to walk the track around 15 laps. He uses his oxygen on as needed basis, and used 2L when walking. We will monitor to ensure his oxygen saturations stay above 88% at all times. We will continue to monitor as he progresses in the program. Sean [name] is doing well in rehab. He feels that his breathing and stamina have improved since he began the program. He has began to improve his workloads and laps on the track. He has been going to the the [facility] for resistance training with handweights. We talked about  possibly starting to walk the track some at the [facility] as well. We will continue to monitor his progress in the program. Sean [name] is doing well in the program. He has done well with the XR at level 2 and improved to level 3 on the T5 nustep. He also walked up to 30 laps on the track and has continued to walk at a speed of 2.3 mph with a 0.5% incline on the treadmill. He has still been rating his shortness of breath as a 2 on the dyspnea scale during exercise. We will continue to monitor his progress in the program. Sean [name] was encouraged to start walking down to rehab with his oxygen pushing wheelchair.   Expected Outcomes Short: Use RPE daily to regulate intensity. Long: Follow program prescription in THR. ShorT: Continue to follow initial exercise prescription Long: Build up overall strength and stamina Short: Begin walking at the [facility] along with resistance training. Long: Continue to improve strength and stamina Short: Progressively increase treadmill workload. Long: Continue to improve strength and stamina. Short: Start to walk to rehab Long: Continue to improve stamina    Row Name 07/01/22 1012 07/05/22 1105 07/19/22 1432 08/02/22 1254 08/12/22 1034     Exercise Goal Re-Evaluation   Exercise Goals Review Increase Physical Activity;Increase Strength and Stamina;Able to understand and use rate  of perceived exertion (RPE) scale;Able to understand and use Dyspnea scale;Knowledge and understanding of Target Heart Rate Range (THRR);Able to check pulse independently;Understanding of Exercise Prescription Increase Physical Activity;Increase Strength and Stamina;Understanding of Exercise Prescription Increase Physical Activity;Increase Strength and Stamina;Understanding of Exercise Prescription Increase Physical Activity;Increase Strength and Stamina;Understanding of Exercise Prescription Increase Physical Activity;Increase Strength and Stamina;Understanding of Exercise Prescription   Comments Reviewed home exercise with pt today.  Pt plans to walk and use weights with his weight machine at home for exercise.  We also talked about using staff videos as an option as well.  Reviewed THR, pulse, RPE, sign and symptoms, pulse oximetry and when to call 911 or MD.  Also discussed weather considerations and indoor options.  Pt voiced understanding. Sean [name] is doing well in rehab. He has stayed consistent at 2.3/1% workload on the treadmill and would benefit from increasing that. He tried out the REL for the first time and was able to work out at level 3. His oxygen saturations are staying above 88% and RPEs are staying appropriate. Will continue to monitor. Sean [name] has only attended rehab once since the last review. During his one session he walked the treadmill at 2.1 mph with no incline. He also improved from level 2 to level 5 on the XR. He has continued to use 5 lb hand weights for resistance training as well. We will continue to monitor his progress in the program. Sean [name] has not attended rehab since 3/26, he had a procedure and has several appts leading up to that. Once patient is cleared to come back, we will encourage to maintain consistent attendance. Sean [name] is doing well in rehab.  He has only had a handful of session since last review due to not feeling well and other appointments.  We talked about  making sure he works to get into rehab to see a better improvement.  We talked about importance of getting cardio in to train oxygen mobilization.  He is doing weights at home to build muscle.  We also talked about the importance of getting to rehab as he builds towards transplant.   Expected Outcomes Short: Start to add in more walking  at home Long: Continue to exercise independently Short: Increase speed on treadmill Long: Continue to increase overall MET level and stamina Short: Return to regular attendance in the program. Long: Continue to improve strength and stamina. Short: Return to rehab when cleared Long: Graduate from [employer] Short: Regular attend at rehab Long: Add in more cardio at home.    Row Name 08/16/22 0935             Exercise Goal Re-Evaluation   Exercise Goals Review Increase Physical Activity;Increase Strength and Stamina;Understanding of Exercise Prescription       Comments Sean [name] returned for two sessions after not attending rehab since 07/06/2022. During his two sessions he was able to work on the treadmill at a speed of 2 mph and 0.5% incline. He also worked at level 5 on the XR and continued to use 5 lb hand weights for resistance training. We will continue to monitor his progress in the program.       Expected Outcomes Short: Attend rehab regularly. Long: Continue to improve strength and stamina.                Discharge Exercise Prescription (Final Exercise Prescription Changes):  Exercise Prescription Changes - 08/16/22 0900       Response to Exercise   Blood Pressure (Admit) 112/68    Blood Pressure (Exit) 126/70    Heart Rate (Admit) 95 bpm    Heart Rate (Exercise) 149 bpm    Heart Rate (Exit) 99 bpm    Oxygen Saturation (Admit) 95 %    Oxygen Saturation (Exercise) 88 %    Oxygen Saturation (Exit) 95 %    Rating of Perceived Exertion (Exercise) 11    Symptoms SOB    Duration Continue with 30 min of aerobic exercise without signs/symptoms of  physical distress.    Intensity THRR unchanged      Progression   Progression Continue to progress workloads to maintain intensity without signs/symptoms of physical distress.    Average METs 3.56      Resistance Training   Training Prescription Yes    Weight 5 lb    Reps 10-15      Interval Training   Interval Training No      Oxygen   Oxygen Continuous    Liters 2      Treadmill   MPH 2    Grade 0.5    Minutes 15    METs 2.67      REL-XR   Level 5    Minutes 15    METs 4.6      Home Exercise Plan   Plans to continue exercise at Home (comment)   walking, weights, staff videos   Frequency Add 3 additional days to program exercise sessions.    Initial Home Exercises Provided 07/01/22      Oxygen   Maintain Oxygen Saturation 88% or higher             Nutrition:  Target Goals: Understanding of nutrition guidelines, daily intake of sodium 1500mg , cholesterol 200mg , calories 30% from fat and 7% or less from saturated fats, daily to have 5 or more servings of fruits and vegetables.  Education: All About Nutrition: -Group instruction provided by verbal, written material, interactive activities, discussions, models, and posters to present general guidelines for heart healthy nutrition including fat, fiber, MyPlate, the role of sodium in heart healthy nutrition, utilization of the nutrition label, and utilization of this knowledge for meal planning. Follow up email sent  as well. Written material given at graduation.   Biometrics:  Pre Biometrics - 06/02/22 1134       Pre Biometrics   Height 6' 5.5" (1.969 m)    Weight 153 lb 8 oz (69.6 kg)    Waist Circumference 28 inches    Hip Circumference 32 inches    Waist to Hip Ratio 0.88 %    BMI (Calculated) 17.96    Single Leg Stand 30 seconds              Nutrition Therapy Plan and Nutrition Goals:  Nutrition Therapy & Goals - 06/28/22 1333       Nutrition Therapy   Diet Heart healthy, low Na, high  kal/high protein    Protein (specify units) 125g    Fiber 35 grams    Whole Grain Foods 3 servings    Saturated Fats 16 max. grams    Fruits and Vegetables 8 servings/day    Sodium 2 grams      Personal Nutrition Goals   Nutrition Goal ST: build meals with heart healthy fats, fiber, and protein. Cut down on salt used after cooking. Review paperwork and Increase involvement in meal planning/prepping.  LT: follow MyPlate guidelines, gain weight and muscle mass, limit Na <2g/day    Comments 47 y.o. M admitted to pulmonary rehab for ILD. 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 Reviewed relevant medications: humira,furosemide (pt reports no longer prescribed) , GI Cocktail (RULOX,LIDOCAINE), hydroxychloroquine, mycophenolate, pantoprazole, prednisone, breztri, vit D2 . B: grilled cheese sandwich or oatmeal L: big meal- chicken sub or philly cheese steak sub S:granola bar before gym and protein shake after gym D: pizza or meat or chicken with vegetables. Sean [name] reports not cooking or shopping and that usually his girlfriend or mom will cook for him. His mom reports that they bake or grill and use olive oil, she uses mrs dash, but does not add salt while cooking. Sean [name] reports using salt afterwards, but he is not sure how much he adds. He reports going out to eat 3-4x week. Drinks: sweet tea or fruit punch or sometimes soda. He reports that he has never made changes to his diet before. He reports that his weight has been stable - he lost weight (~60lbs over the course of 4-5 months) when he first started getting sick - he feels the medication dulled his appetite and he reports his appetetite has improved. Arison reports using serious mass protein, but onlya half a scoop - this provides 630 calories and 25g of protein; discussed how this also contains many vitamins  and minerals and cautioned against exceeding recommended doses. Encouraged to make sure any supplements are third party tested. Discussed general healthy eating and pulmonary MNT. Discussed high calorie/high protein diet and encouraged eating foods higher in fat to increase calories without increased volume - discussed how most of these fats should ideally come from healthy/nutritionally dense sources; peanut butter, liquid plant oils, avocado, and whole fat greek yogurt. Discussed that he could have another scoop of protein powder (2 scoops total/day) for another snack or sip on it in between meals. Encouraged him to become more involved in the cooking and meal planning/shopping process.      Intervention Plan   Intervention Prescribe, educate and counsel regarding individualized specific dietary modifications aiming towards targeted core components such as weight, hypertension, lipid management, diabetes, heart failure and other comorbidities.    Expected  Outcomes Short Term Goal: Understand basic principles of dietary content, such as calories, fat, sodium, cholesterol and nutrients.;Short Term Goal: A plan has been developed with personal nutrition goals set during dietitian appointment.;Long Term Goal: Adherence to prescribed nutrition plan.             Nutrition Assessments:  MEDIFICTS Score Key: ?70 Need to make dietary changes  40-70 Heart Healthy Diet ? 40 Therapeutic Level Cholesterol Diet  Flowsheet Row Pulmonary Rehab from 06/08/2022 in [facility] Cardiac and Pulmonary Rehab  Picture Your Plate Total Score on Admission 52      Picture Your Plate Scores: <16 Unhealthy dietary pattern with much room for improvement. 41-50 Dietary pattern unlikely to meet recommendations for good health and room for improvement. 51-60 More healthful dietary pattern, with some room for improvement.  >60 Healthy dietary pattern, although there may be some specific behaviors that could be improved.    Nutrition Goals Re-Evaluation:  Nutrition Goals Re-Evaluation     Row Name 06/17/22 1003 08/12/22 1047           Goals   Nutrition Goal -- ST: build meals with heart healthy fats, fiber, and protein. Cut down on salt used after cooking. Review paperwork and Increase involvement in meal planning/prepping.  LT: follow MyPlate guidelines, gain weight and muscle mass, limit Na <2g/day      Comment Sean [name] is looking forward to meeting with the RD and has a meeting scheduled for next week. He is looking forward to talking to the RD about healthy eating patterns to help him gain weight. Sean [name] is working on his diet.  He has been able to increase some of his protein intake to build muscle.  He is not adding as much salt as he did before.  He continues to work on meal planning.      Expected Outcome Short: Meet with RD. Long: Implement dietary patterns discussed with RD. Short: Continue to increase protein Long: Conitnue to improve diet               Nutrition Goals Discharge (Final Nutrition Goals Re-Evaluation):  Nutrition Goals Re-Evaluation - 08/12/22 1047       Goals   Nutrition Goal ST: build meals with heart healthy fats, fiber, and protein. Cut down on salt used after cooking. Review paperwork and Increase involvement in meal planning/prepping.  LT: follow MyPlate guidelines, gain weight and muscle mass, limit Na <2g/day    Comment Sean [name] is working on his diet.  He has been able to increase some of his protein intake to build muscle.  He is not adding as much salt as he did before.  He continues to work on meal planning.    Expected Outcome Short: Continue to increase protein Long: Conitnue to improve diet             Psychosocial: Target Goals: Acknowledge presence or absence of significant depression and/or stress, maximize coping skills, provide positive support system. Participant is able to verbalize types and ability to use techniques and skills needed for reducing  stress and depression.   Education: Stress, Anxiety, and Depression - Group verbal and visual presentation to define topics covered.  Reviews how body is impacted by stress, anxiety, and depression.  Also discusses healthy ways to reduce stress and to treat/manage anxiety and depression.  Written material given at graduation.   Education: Sleep Hygiene -Provides group verbal and written instruction about how sleep can affect your health.  Define sleep hygiene, discuss sleep cycles  and impact of sleep habits. Review good sleep hygiene tips.    Initial Review & Psychosocial Screening:  Initial Psych Review & Screening - 05/25/22 1308       Initial Review   Current issues with Current Stress Concerns    Source of Stress Concerns Unable to perform yard/household activities;Unable to participate in former interests or hobbies      Family Dynamics   Good Support System? Yes   mom     Barriers   Psychosocial barriers to participate in program There are no identifiable barriers or psychosocial needs.;The patient should benefit from training in stress management and relaxation.      Screening Interventions   Interventions Encouraged to exercise;Provide feedback about the scores to participant;To provide support and resources with identified psychosocial needs    Expected Outcomes Long Term Goal: Stressors or current issues are controlled or eliminated.;Short Term goal: Utilizing psychosocial counselor, staff and physician to assist with identification of specific Stressors or current issues interfering with healing process. Setting desired goal for each stressor or current issue identified.;Short Term goal: Identification and review with participant of any Quality of Life or Depression concerns found by scoring the questionnaire.;Long Term goal: The participant improves quality of Life and PHQ9 Scores as seen by post scores and/or verbalization of changes             Quality of Life  Scores:  Scores of 19 and below usually indicate a poorer quality of life in these areas.  A difference of  2-3 points is a clinically meaningful difference.  A difference of 2-3 points in the total score of the Quality of Life Index has been associated with significant improvement in overall quality of life, self-image, physical symptoms, and general health in studies assessing change in quality of life.  PHQ-9: Review Flowsheet       06/29/2022 06/02/2022 10/22/2021  Depression screen PHQ 2/9  Decreased Interest 1 2 0  Down, Depressed, Hopeless 0 1 0  PHQ - 2 Score 1 3 0  Altered sleeping 1 1 -  Tired, decreased energy 1 2 -  Change in appetite 0 1 -  Feeling bad or failure about yourself  0 1 -  Trouble concentrating 0 0 -  Moving slowly or fidgety/restless 0 1 -  Suicidal thoughts 0 0 -  PHQ-9 Score 3 9 -  Difficult doing work/chores Somewhat difficult Very difficult -   Interpretation of Total Score  Total Score Depression Severity:  1-4 = Minimal depression, 5-9 = Mild depression, 10-14 = Moderate depression, 15-19 = Moderately severe depression, 20-27 = Severe depression   Psychosocial Evaluation and Intervention:  Psychosocial Evaluation - 05/25/22 1318       Psychosocial Evaluation & Interventions   Interventions Encouraged to exercise with the program and follow exercise prescription    Comments Mr. Crask is coming to pulmonary rehab with worsening ILD. He reports that since he started getting sick a few years ago, he hasn't really felt like doing much. He finds himself lying around more and getting more winded when he does get up to do things. He used to work in a [employer] and enjoy exercising. He wants to get back some of the strength he has lost. He knows it will be a journey getting back to a more active lifestyle and is wary of his breathing's reaction, but he wants to put in the work. He enjoys playing video games for fun and has a good support system.  Expected  Outcomes Short: attend pulmonary rehab for education and exercise. Long: develop and maintain positive self care habits.    Continue Psychosocial Services  Follow up required by staff             Psychosocial Re-Evaluation:  Psychosocial Re-Evaluation     Row Name 06/17/22 1610 06/29/22 1010 08/12/22 1042         Psychosocial Re-Evaluation   Current issues with Current Sleep Concerns;Current Psychotropic Meds Current Psychotropic Meds Current Psychotropic Meds     Comments Sean [name] denies any current stressors at this time. He reports that his sleep has improved since starting some of his medications, however he does want to become too dependent on the medication to help him sleep. He states that exercise has been a good avenue of stress relief for him. He also reports having a good support system made up by his parents, kids, and girlfriend. Sean [name] PHQ score decreased from a 9 to a 3! Continues ti stay compliant with medications. Exercise is helping him take his mind off of things. Will continue to monitor. Sean [name] is doing well mentally.  His biggest stressor is his health.  He is driving some and was encouraged to drive himself to rehab.  He is sleeping well.  He is still getting SOB whihch limits what he can do.     Expected Outcomes Short: Continue to exercise for mental boost. Long: Maintain positive outlook. Short: Continue exercise for mental health Long: Continue to maintain positive attitude Short; Continue to exercise more to help with breathing Long: Conitnue to stay positiv e     Interventions Encouraged to attend Pulmonary Rehabilitation for the exercise Encouraged to attend Pulmonary Rehabilitation for the exercise Encouraged to attend Pulmonary Rehabilitation for the exercise     Continue Psychosocial Services  Follow up required by staff Follow up required by staff Follow up required by staff              Psychosocial Discharge (Final Psychosocial Re-Evaluation):   Psychosocial Re-Evaluation - 08/12/22 1042       Psychosocial Re-Evaluation   Current issues with Current Psychotropic Meds    Comments Sean [name] is doing well mentally.  His biggest stressor is his health.  He is driving some and was encouraged to drive himself to rehab.  He is sleeping well.  He is still getting SOB whihch limits what he can do.    Expected Outcomes Short; Continue to exercise more to help with breathing Long: Conitnue to stay positiv e    Interventions Encouraged to attend Pulmonary Rehabilitation for the exercise    Continue Psychosocial Services  Follow up required by staff             Education: Education Goals: Education classes will be provided on a weekly basis, covering required topics. Participant will state understanding/return demonstration of topics presented.  Learning Barriers/Preferences:   General Pulmonary Education Topics:  Infection Prevention: - Provides verbal and written material to individual with discussion of infection control including proper hand washing and proper equipment cleaning during exercise session. Flowsheet Row Pulmonary Rehab from 07/01/2022 in [facility] Cardiac and Pulmonary Rehab  Date 06/02/22  Educator [facility]  Instruction Review Code 1- Verbalizes Understanding       Falls Prevention: - Provides verbal and written material to individual with discussion of falls prevention and safety. Flowsheet Row Pulmonary Rehab from 07/01/2022 in [facility] Cardiac and Pulmonary Rehab  Date 06/02/22  Educator [facility]  Instruction Review Code 1- Verbalizes Understanding  Chronic Lung Disease Review: - Group verbal instruction with posters, models, PowerPoint presentations and videos,  to review new updates, new respiratory medications, new advancements in procedures and treatments. Providing information on websites and "800" numbers for continued self-education. Includes information about supplement oxygen, available portable oxygen systems,  continuous and intermittent flow rates, oxygen safety, concentrators, and Medicare reimbursement for oxygen. Explanation of Pulmonary Drugs, including class, frequency, complications, importance of spacers, rinsing mouth after steroid MDI's, and proper cleaning methods for nebulizers. Review of basic lung anatomy and physiology related to function, structure, and complications of lung disease. Review of risk factors. Discussion about methods for diagnosing sleep apnea and types of masks and machines for OSA. Includes a review of the use of types of environmental controls: home humidity, furnaces, filters, dust mite/pet prevention, HEPA vacuums. Discussion about weather changes, air quality and the benefits of nasal washing. Instruction on Warning signs, infection symptoms, calling MD promptly, preventive modes, and value of vaccinations. Review of effective airway clearance, coughing and/or vibration techniques. Emphasizing that all should Create an Action Plan. Written material given at graduation. Flowsheet Row Pulmonary Rehab from 07/01/2022 in [facility] Cardiac and Pulmonary Rehab  Date 06/17/22  Educator [facility]  Instruction Review Code 1- Verbalizes Understanding       AED/CPR: - Group verbal and written instruction with the use of models to demonstrate the basic use of the AED with the basic ABC's of resuscitation.    Anatomy and Cardiac Procedures: - Group verbal and visual presentation and models provide information about basic cardiac anatomy and function. Reviews the testing methods done to diagnose heart disease and the outcomes of the test results. Describes the treatment choices: Medical Management, Angioplasty, or Coronary Bypass Surgery for treating various heart conditions including Myocardial Infarction, Angina, Valve Disease, and Cardiac Arrhythmias.  Written material given at graduation.   Medication Safety: - Group verbal and visual instruction to review commonly prescribed medications  for heart and lung disease. Reviews the medication, class of the drug, and side effects. Includes the steps to properly store meds and maintain the prescription regimen.  Written material given at graduation.   Other: -Provides group and verbal instruction on various topics (see comments)   Knowledge Questionnaire Score:  Knowledge Questionnaire Score - 06/08/22 0939       Knowledge Questionnaire Score   Pre Score 13/16              Core Components/Risk Factors/Patient Goals at Admission:  Personal Goals and Risk Factors at Admission - 06/02/22 1135       Core Components/Risk Factors/Patient Goals on Admission    Weight Management Yes;Weight Gain    Intervention Weight Management: Develop a combined nutrition and exercise program designed to reach desired caloric intake, while maintaining appropriate intake of nutrient and fiber, sodium and fats, and appropriate energy expenditure required for the weight goal.;Weight Management: Provide education and appropriate resources to help participant work on and attain dietary goals.    Admit Weight 153 lb 8 oz (69.6 kg)    Goal Weight: Short Term 160 lb (72.6 kg)    Goal Weight: Long Term 165 lb (74.8 kg)    Expected Outcomes Long Term: Adherence to nutrition and physical activity/exercise program aimed toward attainment of established weight goal;Short Term: Continue to assess and modify interventions until short term weight is achieved;Weight Gain: Understanding of general recommendations for a high calorie, high protein meal plan that promotes weight gain by distributing calorie intake throughout the day with the consumption  for 4-5 meals, snacks, and/or supplements;Weight Maintenance: Understanding of the daily nutrition guidelines, which includes 25-35% calories from fat, 7% or less cal from saturated fats, less than 200mg  cholesterol, less than 1.5gm of sodium, & 5 or more servings of fruits and vegetables daily    Improve shortness of  breath with ADL's Yes    Intervention Provide education, individualized exercise plan and daily activity instruction to help decrease symptoms of SOB with activities of daily living.    Expected Outcomes Short Term: Improve cardiorespiratory fitness to achieve a reduction of symptoms when performing ADLs;Long Term: Be able to perform more ADLs without symptoms or delay the onset of symptoms    Increase knowledge of respiratory medications and ability to use respiratory devices properly  Yes    Intervention Provide education and demonstration as needed of appropriate use of medications, inhalers, and oxygen therapy.    Expected Outcomes Short Term: Achieves understanding of medications use. Understands that oxygen is a medication prescribed by physician. Demonstrates appropriate use of inhaler and oxygen therapy.;Long Term: Maintain appropriate use of medications, inhalers, and oxygen therapy.    Diabetes Yes    Intervention Provide education about signs/symptoms and action to take for hypo/hyperglycemia.;Provide education about proper nutrition, including hydration, and aerobic/resistive exercise prescription along with prescribed medications to achieve blood glucose in normal ranges: Fasting glucose 65-99 mg/dL    Expected Outcomes Short Term: Participant verbalizes understanding of the signs/symptoms and immediate care of hyper/hypoglycemia, proper foot care and importance of medication, aerobic/resistive exercise and nutrition plan for blood glucose control.;Long Term: Attainment of HbA1C < 7%.    Hypertension Yes    Intervention Provide education on lifestyle modifcations including regular physical activity/exercise, weight management, moderate sodium restriction and increased consumption of fresh fruit, vegetables, and low fat dairy, alcohol moderation, and smoking cessation.;Monitor prescription use compliance.    Expected Outcomes Short Term: Continued assessment and intervention until BP is <  140/25mm HG in hypertensive participants. < 130/18mm HG in hypertensive participants with diabetes, heart failure or chronic kidney disease.;Long Term: Maintenance of blood pressure at goal levels.             Education:Diabetes - Individual verbal and written instruction to review signs/symptoms of diabetes, desired ranges of glucose level fasting, after meals and with exercise. Acknowledge that pre and post exercise glucose checks will be done for 3 sessions at entry of program. Flowsheet Row Pulmonary Rehab from 07/01/2022 in [facility] Cardiac and Pulmonary Rehab  Date 05/25/22  Educator [facility]  Instruction Review Code 1- Verbalizes Understanding       Know Your Numbers and Heart Failure: - Group verbal and visual instruction to discuss disease risk factors for cardiac and pulmonary disease and treatment options.  Reviews associated critical values for Overweight/Obesity, Hypertension, Cholesterol, and Diabetes.  Discusses basics of heart failure: signs/symptoms and treatments.  Introduces Heart Failure Zone chart for action plan for heart failure.  Written material given at graduation. Flowsheet Row Pulmonary Rehab from 07/01/2022 in [facility] Cardiac and Pulmonary Rehab  Date 06/10/22  Educator [facility]  Instruction Review Code 1- Verbalizes Understanding       Core Components/Risk Factors/Patient Goals Review:   Goals and Risk Factor Review     Row Name 06/17/22 [phone]             Core Components/Risk Factors/Patient Goals Review   Personal Goals Review Weight Management/Obesity;Diabetes;Hypertension       Review Kevin states that he wants to gain some weight and has begun to  eat more to meet his weight goal. He would like to gain about 20 lbs with a weight goal of 180 lb. He has also been checking his blood sugars and reports that they are within normal ranges. He does not own a BP cuff so he has not been checking his blood pressures at home. His BP readings have been within normal ranges  while at rehab.       Expected Outcomes Short: Continue to monitor blood sugars. Long: Continue to work towards weight goal.                Core Components/Risk Factors/Patient Goals at Discharge (Final Review):   Goals and Risk Factor Review - 06/17/22 0956       Core Components/Risk Factors/Patient Goals Review   Personal Goals Review Weight Management/Obesity;Diabetes;Hypertension    Review Marland states that he wants to gain some weight and has begun to eat more to meet his weight goal. He would like to gain about 20 lbs with a weight goal of 180 lb. He has also been checking his blood sugars and reports that they are within normal ranges. He does not own a BP cuff so he has not been checking his blood pressures at home. His BP readings have been within normal ranges while at rehab.    Expected Outcomes Short: Continue to monitor blood sugars. Long: Continue to work towards weight goal.             ITP Comments:  ITP Comments     Row Name 05/25/22 1323 06/02/22 1119 06/03/22 1122 06/28/22 1333 06/30/22 0815   ITP Comments Initial phone call completed. Diagnosis can be found in [facility] 2/5. EP Orientation scheduled for Wednesday, 2/21 at 2:30. Completed and gym orientation. Initial ITP created and sent for review to Dr. Jinny Sanders, Medical Director.  He will return his paperwork on his first day. First full day of exercise!  Patient was oriented to gym and equipment including functions, settings, policies, and procedures.  Patient's individual exercise prescription and treatment plan were reviewed.  All starting workloads were established based on the results of the 6 minute walk test done at initial orientation visit.  The plan for exercise progression was also introduced and progression will be customized based on patient's performance and goals. Completed Initial RD Consultation 30 Day review completed. Medical Director ITP review done, changes made as directed, and signed  approval by Medical Director.    Row Name 07/27/22 [phone] 07/28/22 0841 08/25/22 0823       ITP Comments Patient's mother called to let us know that Padraic is having a procedure done tomorrow and has prep today.  He will also be out again on Thursday.  He has not attended since 07/06/22 for various reasons.  We have not been able to assess for goals this round. 30 day review completed. ITP sent to Dr. Jinny Sanders, Medical Director of  Pulmonary Rehab. Continue with ITP unless changes are made by physician. 30 Day review completed. Medical Director ITP review done, changes made as directed, and signed approval by Medical Director.              Comments:

## 2022-08-26 ENCOUNTER — Encounter: Payer: Medicaid Other | Admitting: *Deleted

## 2022-08-26 DIAGNOSIS — J849 Interstitial pulmonary disease, unspecified: Secondary | ICD-10-CM

## 2022-08-26 NOTE — Progress Notes (Signed)
Daily Session Note  Patient Details  Name: Sean [name] MRN: [id number] Date of Birth: Jun 28, 1975 Referring Provider:   Flowsheet Row Pulmonary Rehab from 06/02/2022 in [facility] Cardiac and Pulmonary Rehab  Referring Provider Josph Macho MD       Encounter Date: 08/26/2022  Check In:  Session Check In - 08/26/22 1029       Check-In   Supervising physician immediately available to respond to emergencies See telemetry face sheet for immediately available ER MD    Location [facility]-Cardiac & Pulmonary Rehab    Staff Present Lanny Hurst, RN, ADN;Jessica Juanetta Gosling, MA, RCEP, CCRP, Zackery Barefoot, MS, ACSM CEP, Exercise Physiologist    Virtual Visit No    Medication changes reported     No    Fall or balance concerns reported    No    Warm-up and Cool-down Performed on first and last piece of equipment    Resistance Training Performed Yes    VAD Patient? No    PAD/SET Patient? No      Pain Assessment   Currently in Pain? No/denies                Social History   Tobacco Use  Smoking Status Former   Types: Cigarettes  Smokeless Tobacco Never    Goals Met:  Independence with exercise equipment Exercise tolerated well No report of concerns or symptoms today Strength training completed today  Goals Unmet:  Not Applicable  Comments: Pt able to follow exercise prescription today without complaint.  Will continue to monitor for progression.    Dr. Bethann Punches is Medical Director for [facility] Cardiac Rehabilitation.  Dr. Vida Rigger is Medical Director for [facility] Pulmonary Rehabilitation.

## 2022-09-02 ENCOUNTER — Encounter: Payer: Medicaid Other | Admitting: *Deleted

## 2022-09-02 DIAGNOSIS — J849 Interstitial pulmonary disease, unspecified: Secondary | ICD-10-CM

## 2022-09-02 NOTE — Progress Notes (Signed)
Daily Session Note  Patient Details  Name: Sean [name] MRN: [id number] Date of Birth: 12-08-1975 Referring Provider:   Flowsheet Row Pulmonary Rehab from 06/02/2022 in [facility] Cardiac and Pulmonary Rehab  Referring Provider Josph Macho MD       Encounter Date: 09/02/2022  Check In:  Session Check In - 09/02/22 0954       Check-In   Supervising physician immediately available to respond to emergencies See telemetry face sheet for immediately available ER MD    Location [facility]-Cardiac & Pulmonary Rehab    Staff Present Lanny Hurst, RN, ADN;Jessica Juanetta Gosling, MA, RCEP, CCRP, CCET;Joseph [place], RCP,RRT,BSRT;Other   [place], [place]   Virtual Visit No    Medication changes reported     No    Fall or balance concerns reported    No    Warm-up and Cool-down Performed on first and last piece of equipment    Resistance Training Performed Yes    VAD Patient? No    PAD/SET Patient? No      Pain Assessment   Currently in Pain? No/denies                Social History   Tobacco Use  Smoking Status Former   Types: Cigarettes  Smokeless Tobacco Never    Goals Met:  Independence with exercise equipment Exercise tolerated well No report of concerns or symptoms today Strength training completed today  Goals Unmet:  Not Applicable  Comments: Pt able to follow exercise prescription today without complaint.  Will continue to monitor for progression.    Dr. Bethann Punches is Medical Director for [facility] Cardiac Rehabilitation.  Dr. Vida Rigger is Medical Director for [facility] Pulmonary Rehabilitation.

## 2022-09-08 ENCOUNTER — Ambulatory Visit: Payer: Medicaid Other | Admitting: *Deleted

## 2022-09-09 ENCOUNTER — Encounter: Payer: Medicaid Other | Admitting: *Deleted

## 2022-09-16 ENCOUNTER — Telehealth: Payer: Self-pay | Admitting: *Deleted

## 2022-09-16 ENCOUNTER — Encounter: Payer: Self-pay | Admitting: *Deleted

## 2022-09-16 DIAGNOSIS — J849 Interstitial pulmonary disease, unspecified: Secondary | ICD-10-CM

## 2022-09-16 NOTE — Telephone Encounter (Signed)
Pt's mom called this morning and requested for him to come this afternoon.  Pt did not show for appt, called to check on him, he had just woken up thinking appt was tomorrow.  He did reschedule class for afternoon starting on Monday.  If he does not attend Monday, we will not be able to assess his goals.

## 2022-09-20 ENCOUNTER — Ambulatory Visit: Payer: Medicaid Other

## 2022-09-22 ENCOUNTER — Encounter: Payer: Self-pay | Admitting: *Deleted

## 2022-09-22 ENCOUNTER — Ambulatory Visit: Payer: Medicaid Other

## 2022-09-22 DIAGNOSIS — J849 Interstitial pulmonary disease, unspecified: Secondary | ICD-10-CM

## 2022-09-22 NOTE — Progress Notes (Signed)
Pulmonary Individual Treatment Plan  Patient Details  Name: Sean [name] MRN: [id number] Date of Birth: 12/25/1975 Referring Provider:   Flowsheet Row Pulmonary Rehab from 06/02/2022 in [facility] Cardiac and Pulmonary Rehab  Referring Provider Josph Macho MD       Initial Encounter Date:  Flowsheet Row Pulmonary Rehab from 06/02/2022 in [facility] Cardiac and Pulmonary Rehab  Date 06/02/22       Visit Diagnosis: ILD (interstitial lung disease) (HCC)  Patient's Home Medications on Admission:  Current Outpatient Medications:    acetaminophen (TYLENOL) 500 MG tablet, Take by mouth., Disp: , Rfl:    albuterol (VENTOLIN HFA) 108 (90 Base) MCG/ACT inhaler, Inhale 2 puffs into the lungs every 6 (six) hours as needed for wheezing or shortness of breath., Disp: 8 g, Rfl: 2   blood glucose meter kit and supplies KIT, Dispense based on patient and insurance preference. Use up to four times daily as directed., Disp: 1 each, Rfl: 0   blood glucose meter kit and supplies, Dispense based on patient and insurance preference. Use up to four times daily as directed. (FOR ICD-10 E10.9, E11.9)., Disp: 1 each, Rfl: 0   Blood Glucose Monitoring Suppl (CONTOUR NEXT ONE) KIT, 4 (four) times daily., Disp: , Rfl:    BREZTRI AEROSPHERE 160-9-4.8 MCG/ACT AERO, Inhale 2 puffs into the lungs 2 (two) times daily., Disp: 10.7 g, Rfl: 0   chlorhexidine (PERIDEX) 0.12 % solution, 15 mLs 2 (two) times daily. (Patient not taking: Reported on 05/25/2022), Disp: , Rfl:    Cholecalciferol (VITAMIN D-1000 MAX ST) 25 MCG (1000 UT) tablet, Take by mouth., Disp: , Rfl:    clobetasol ointment (TEMOVATE) 0.05 %, Apply topically., Disp: , Rfl:    furosemide (LASIX) 20 MG tablet, Take 1 tablet (20 mg total) by mouth daily., Disp: 30 tablet, Rfl: 0   hydroxychloroquine (PLAQUENIL) 200 MG tablet, Place 1 tablet (200 mg total) into feeding tube 2 (two) times daily. (Patient not taking: Reported on 05/25/2022), Disp: , Rfl:    insulin aspart  (NOVOLOG) 100 UNIT/ML FlexPen, Inject 1-9 Units into the skin every 6 (six) hours. CBG 70 - 120: 0 units CBG 121 - 150: 1 unit CBG 151 - 200: 2 units CBG 201 - 250: 3 units CBG 251 - 300: 5 units CBG 301 - 350: 7 units CBG 351 - 400: 9 units CBG > 400: call MD and obtain STAT lab verification, Disp: 15 mL, Rfl: 11   Insulin Pen Needle (PEN NEEDLES 3/16") 31G X 5 MM MISC, 1 pen. by Does not apply route every 6 (six) hours., Disp: 200 each, Rfl: 1   lidocaine (XYLOCAINE) 2 % solution, Use as directed 15 mLs in the mouth or throat every 6 (six) hours as needed (mouth/throat pain). (Patient not taking: Reported on 05/25/2022), Disp: 100 mL, Rfl: 0   meloxicam (MOBIC) 15 MG tablet, Take by mouth., Disp: , Rfl:    Multiple Vitamins-Minerals (SUPER THERA VITE M) TABS, Take 1 tablet by mouth daily., Disp: , Rfl:    NONFORMULARY OR COMPOUNDED ITEM, LIDOCAINE/MAALOX XS 1:1 (Patient not taking: Reported on 05/25/2022), Disp: , Rfl:    pantoprazole (PROTONIX) 40 MG tablet, Take 1 tablet (40 mg total) by mouth daily. (Patient not taking: Reported on 05/25/2022), Disp: 30 tablet, Rfl: 0   tacrolimus (PROTOPIC) 0.1 % ointment, Apply topically 2 (two) times daily., Disp: , Rfl:    thiamine (VITAMIN B1) 100 MG tablet, Take by mouth., Disp: , Rfl:    triamcinolone cream (KENALOG)  0.1 %, Apply topically 2 (two) times daily., Disp: , Rfl:    Vitamin D 12.5 MCG/0.25ML LIQD, Give 1 mL by tube daily. (Patient not taking: Reported on 05/25/2022), Disp: 36 mL, Rfl: 1  Past Medical History: Past Medical History:  Diagnosis Date   H/O immunosuppressive therapy    Hyperpigmentation of skin    Hyperpigmentation rash/vesicles of the palm and face   Neutropenia (HCC)    RA (rheumatoid arthritis) (HCC)     Tobacco Use: Social History   Tobacco Use  Smoking Status Former   Types: Cigarettes  Smokeless Tobacco Never    Labs: Review Flowsheet  More data may exist      Latest Ref Rng & Units 07/26/2021 09/10/2021 01/12/2022  06/03/2022 08/12/2022  Labs for ITP Cardiac and Pulmonary Rehab  Cholestrol 0 - 200 mg/dL - - 161  096  045   LDL (calc) 0 - 99 mg/dL - - 91  64  78   HDL-C >40 mg/dL - - 38  35  37   Trlycerides <150 mg/dL 409  - 811  85  914   Hemoglobin A1c 4.8 - 5.6 % - 11.9  6.4  5.5  5.4      Pulmonary Assessment Scores:  Pulmonary Assessment Scores     Row Name 06/02/22 1137 06/08/22 0938       ADL UCSD   ADL Phase Exit Entry    SOB Score total -- 75    Rest -- 0    Walk -- 2    Stairs -- 5    Bath -- 3    Dress -- 3    Shop -- 4      CAT Score   CAT Score -- 16      mMRC Score   mMRC Score 4 --             UCSD: Self-administered rating of dyspnea associated with activities of daily living (ADLs) 6-point scale (0 = "not at all" to 5 = "maximal or unable to do because of breathlessness")  Scoring Scores range from 0 to 120.  Minimally important difference is 5 units  CAT: CAT can identify the health impairment of COPD patients and is better correlated with disease progression.  CAT has a scoring range of zero to 40. The CAT score is classified into four groups of low (less than 10), medium (10 - 20), high (21-30) and very high (31-40) based on the impact level of disease on health status. A CAT score over 10 suggests significant symptoms.  A worsening CAT score could be explained by an exacerbation, poor medication adherence, poor inhaler technique, or progression of COPD or comorbid conditions.  CAT MCID is 2 points  mMRC: mMRC (Modified Medical Research Council) Dyspnea Scale is used to assess the degree of baseline functional disability in patients of respiratory disease due to dyspnea. No minimal important difference is established. A decrease in score of 1 point or greater is considered a positive change.   Pulmonary Function Assessment:   Exercise Target Goals: Exercise Program Goal: Individual exercise prescription set using results from initial 6 min walk test and  THRR while considering  patient's activity barriers and safety.   Exercise Prescription Goal: Initial exercise prescription builds to 30-45 minutes a day of aerobic activity, 2-3 days per week.  Home exercise guidelines will be given to patient during program as part of exercise prescription that the participant will acknowledge.  Education: Aerobic Exercise: - Group verbal  and visual presentation on the components of exercise prescription. Introduces F.I.T.T principle from ACSM for exercise prescriptions.  Reviews F.I.T.T. principles of aerobic exercise including progression. Written material given at graduation.   Education: Resistance Exercise: - Group verbal and visual presentation on the components of exercise prescription. Introduces F.I.T.T principle from ACSM for exercise prescriptions  Reviews F.I.T.T. principles of resistance exercise including progression. Written material given at graduation.    Education: Exercise & Equipment Safety: - Individual verbal instruction and demonstration of equipment use and safety with use of the equipment. Flowsheet Row Pulmonary Rehab from 09/02/2022 in [facility] Cardiac and Pulmonary Rehab  Date 06/02/22  Educator [facility]  Instruction Review Code 1- Verbalizes Understanding       Education: Exercise Physiology & General Exercise Guidelines: - Group verbal and written instruction with models to review the exercise physiology of the cardiovascular system and associated critical values. Provides general exercise guidelines with specific guidelines to those with heart or lung disease.  Flowsheet Row Pulmonary Rehab from 09/02/2022 in [facility] Cardiac and Pulmonary Rehab  Date 07/01/22  Educator [facility]  Instruction Review Code 1- Verbalizes Understanding       Education: Flexibility, Balance, Mind/Body Relaxation: - Group verbal and visual presentation with interactive activity on the components of exercise prescription. Introduces F.I.T.T principle from ACSM  for exercise prescriptions. Reviews F.I.T.T. principles of flexibility and balance exercise training including progression. Also discusses the mind body connection.  Reviews various relaxation techniques to help reduce and manage stress (i.e. Deep breathing, progressive muscle relaxation, and visualization). Balance handout provided to take home. Written material given at graduation.   Activity Barriers & Risk Stratification:  Activity Barriers & Cardiac Risk Stratification - 06/02/22 1127       Activity Barriers & Cardiac Risk Stratification   Activity Barriers Shortness of Breath;Deconditioning;Muscular Weakness             6 Minute Walk:  6 Minute Walk     Row Name 06/02/22 1125         6 Minute Walk   Phase Initial     Distance 1261 feet     Walk Time 6 minutes     # of Rest Breaks 0     MPH 2.39     METS 5.42     RPE 13     Perceived Dyspnea  3     VO2 Peak 18.99     Symptoms Yes (comment)     Comments SOB     Resting HR 102 bpm     Resting BP 104/58     Resting Oxygen Saturation  87 %     Exercise Oxygen Saturation  during 6 min walk 79 %     Max Ex. HR 125 bpm     Max Ex. BP 136/74     2 Minute Post BP 122/66       Interval HR   1 Minute HR 113     2 Minute HR 117     3 Minute HR 122     4 Minute HR 125     5 Minute HR 125     6 Minute HR 125     2 Minute Post HR 107     Interval Heart Rate? Yes       Interval Oxygen   Interval Oxygen? Yes     Baseline Oxygen Saturation % 93 %     1 Minute Oxygen Saturation % 87 %     1 Minute  Liters of Oxygen 0 L  Room Air     2 Minute Oxygen Saturation % 86 %     2 Minute Liters of Oxygen 0 L     3 Minute Oxygen Saturation % 81 %     3 Minute Liters of Oxygen 0 L     4 Minute Oxygen Saturation % 80 %     4 Minute Liters of Oxygen 0 L     5 Minute Oxygen Saturation % 80 %     5 Minute Liters of Oxygen 0 L     6 Minute Oxygen Saturation % 81 %  79% at 5:30     6 Minute Liters of Oxygen 0 L     2 Minute Post  Oxygen Saturation % 90 %     2 Minute Post Liters of Oxygen 0 L             Oxygen Initial Assessment:  Oxygen Initial Assessment - 06/02/22 1136       Home Oxygen   Home Oxygen Device E-Tanks;Home Concentrator    Sleep Oxygen Prescription None   2L when saturations get low at night as well   Home Exercise Oxygen Prescription None   encouraged to use   Home Resting Oxygen Prescription None    Compliance with Home Oxygen Use Yes      Initial 6 min Walk   Oxygen Used None      Program Oxygen Prescription   Program Oxygen Prescription Continuous;E-Tanks    Liters per minute 2    Comments use 1-2L for exercise to avoid desaturations with exercise      Intervention   Short Term Goals To learn and exhibit compliance with exercise, home and travel O2 prescription;To learn and understand importance of monitoring SPO2 with pulse oximeter and demonstrate accurate use of the pulse oximeter.;To learn and understand importance of maintaining oxygen saturations>88%;To learn and demonstrate proper pursed lip breathing techniques or other breathing techniques. ;To learn and demonstrate proper use of respiratory medications    Long  Term Goals Exhibits compliance with exercise, home  and travel O2 prescription;Verbalizes importance of monitoring SPO2 with pulse oximeter and return demonstration;Maintenance of O2 saturations>88%;Exhibits proper breathing techniques, such as pursed lip breathing or other method taught during program session;Compliance with respiratory medication;Demonstrates proper use of MDI's             Oxygen Re-Evaluation:  Oxygen Re-Evaluation     Row Name 06/03/22 1123 06/17/22 0959 08/12/22 1540         Program Oxygen Prescription   Program Oxygen Prescription -- Continuous;E-Tanks Continuous;E-Tanks     Liters per minute -- 2 --     Comments -- use 1-2L for exercise to avoid desaturations with exercise use 1-2L for exercise to avoid desaturations with exercise        Home Oxygen   Home Oxygen Device -- E-Tanks;Home Concentrator E-Tanks;Home Concentrator     Sleep Oxygen Prescription -- None None     Home Exercise Oxygen Prescription -- None None     Home Resting Oxygen Prescription -- None None     Compliance with Home Oxygen Use -- Yes Yes       Goals/Expected Outcomes   Short Term Goals -- To learn and exhibit compliance with exercise, home and travel O2 prescription;To learn and understand importance of monitoring SPO2 with pulse oximeter and demonstrate accurate use of the pulse oximeter.;To learn and understand importance of maintaining oxygen saturations>88%;To learn and demonstrate  proper pursed lip breathing techniques or other breathing techniques. ;To learn and demonstrate proper use of respiratory medications To learn and exhibit compliance with exercise, home and travel O2 prescription;To learn and understand importance of monitoring SPO2 with pulse oximeter and demonstrate accurate use of the pulse oximeter.;To learn and understand importance of maintaining oxygen saturations>88%;To learn and demonstrate proper pursed lip breathing techniques or other breathing techniques. ;To learn and demonstrate proper use of respiratory medications     Long  Term Goals -- Exhibits compliance with exercise, home  and travel O2 prescription;Verbalizes importance of monitoring SPO2 with pulse oximeter and return demonstration;Maintenance of O2 saturations>88%;Exhibits proper breathing techniques, such as pursed lip breathing or other method taught during program session;Compliance with respiratory medication;Demonstrates proper use of MDI's Exhibits compliance with exercise, home  and travel O2 prescription;Verbalizes importance of monitoring SPO2 with pulse oximeter and return demonstration;Maintenance of O2 saturations>88%;Exhibits proper breathing techniques, such as pursed lip breathing or other method taught during program session;Compliance with respiratory  medication;Demonstrates proper use of MDI's     Comments Reviewed PLB technique with pt.  Talked about how it works and it's importance in maintaining their exercise saturations. We reviewed PLB techique, and Alessander states that he has continued to practice PLB especially during exercise. He also states that he has been monitoring his oxygen saturations and reports that they have been dropping to 89-90% during exercise, but recovers well. Jung is doing well in rehab other than consistent attendance.  His breathing is starting to get better and he is not using his oxygen as much at home.  Mostly only needs it when he is walking.  He has been able to lift weights and do stuff around house without as long as he takes rest breaks.  His saturations are staying in low 90s at home.  He was encouraged to conintue to use PLB to help with breathing and to use his oxygen for cardio.     Goals/Expected Outcomes Short: Become more profiecient at using PLB. Long: Become independent at using PLB. Short: Become more profiecient at using PLB. Long: Become independent at using PLB. Short: Use oxygen for cardio Long: conitnue to use PLB              Oxygen Discharge (Final Oxygen Re-Evaluation):  Oxygen Re-Evaluation - 08/12/22 1540       Program Oxygen Prescription   Program Oxygen Prescription Continuous;E-Tanks    Comments use 1-2L for exercise to avoid desaturations with exercise      Home Oxygen   Home Oxygen Device E-Tanks;Home Concentrator    Sleep Oxygen Prescription None    Home Exercise Oxygen Prescription None    Home Resting Oxygen Prescription None    Compliance with Home Oxygen Use Yes      Goals/Expected Outcomes   Short Term Goals To learn and exhibit compliance with exercise, home and travel O2 prescription;To learn and understand importance of monitoring SPO2 with pulse oximeter and demonstrate accurate use of the pulse oximeter.;To learn and understand importance of maintaining oxygen  saturations>88%;To learn and demonstrate proper pursed lip breathing techniques or other breathing techniques. ;To learn and demonstrate proper use of respiratory medications    Long  Term Goals Exhibits compliance with exercise, home  and travel O2 prescription;Verbalizes importance of monitoring SPO2 with pulse oximeter and return demonstration;Maintenance of O2 saturations>88%;Exhibits proper breathing techniques, such as pursed lip breathing or other method taught during program session;Compliance with respiratory medication;Demonstrates proper use of MDI's    Comments  Christropher is doing well in rehab other than consistent attendance.  His breathing is starting to get better and he is not using his oxygen as much at home.  Mostly only needs it when he is walking.  He has been able to lift weights and do stuff around house without as long as he takes rest breaks.  His saturations are staying in low 90s at home.  He was encouraged to conintue to use PLB to help with breathing and to use his oxygen for cardio.    Goals/Expected Outcomes Short: Use oxygen for cardio Long: conitnue to use PLB             Initial Exercise Prescription:  Initial Exercise Prescription - 06/02/22 1100       Date of Initial Exercise RX and Referring Provider   Date 06/02/22    Referring Provider Josph Macho MD      Oxygen   Oxygen Continuous    Liters 1-2    Maintain Oxygen Saturation 88% or higher      Treadmill   MPH 2.3    Grade 0.5    Minutes 15    METs 2.92      REL-XR   Level 2    Speed 50    Minutes 15    METs 2.5      T5 Nustep   Level 2    SPM 80    Minutes 15    METs 2.5      Track   Laps 34    Minutes 15    METs 2.85      Prescription Details   Frequency (times per week) 2    Duration Progress to 30 minutes of continuous aerobic without signs/symptoms of physical distress      Intensity   THRR 40-80% of Max Heartrate 132-161    Ratings of Perceived Exertion 11-13     Perceived Dyspnea 0-4      Progression   Progression Continue to progress workloads to maintain intensity without signs/symptoms of physical distress.      Resistance Training   Training Prescription Yes    Weight 5 lb    Reps 10-15             Perform Capillary Blood Glucose checks as needed.  Exercise Prescription Changes:   Exercise Prescription Changes     Row Name 06/02/22 1100 06/07/22 1000 06/21/22 1300 07/01/22 1000 07/05/22 1100     Response to Exercise   Blood Pressure (Admit) 104/58 118/62 124/80 -- 126/72   Blood Pressure (Exercise) 136/74 128/68 134/80 -- 128/68   Blood Pressure (Exit) 108/60 106/62 108/70 -- 110/66   Heart Rate (Admit) 102 bpm 98 bpm 97 bpm -- 98 bpm   Heart Rate (Exercise) 125 bpm 111 bpm 131 bpm -- 119 bpm   Heart Rate (Exit) 99 bpm 104 bpm 106 bpm -- 104 bpm   Oxygen Saturation (Admit) 93 % 94 % 92 % -- 95 %   Oxygen Saturation (Exercise) 79 % 89 % 89 % -- 90 %   Oxygen Saturation (Exit) 95 % 94 % 94 % -- 96 %   Rating of Perceived Exertion (Exercise) 13 13 14  -- 13   Perceived Dyspnea (Exercise) 3 1 2  -- 1   Symptoms SOB SOB SOB -- SOB   Comments walk test results 1st full day of exercise -- -- --   Duration -- Progress to 30 minutes of  aerobic without signs/symptoms of physical distress  Continue with 30 min of aerobic exercise without signs/symptoms of physical distress. -- Continue with 30 min of aerobic exercise without signs/symptoms of physical distress.   Intensity -- THRR unchanged THRR unchanged -- THRR unchanged     Progression   Progression -- Continue to progress workloads to maintain intensity without signs/symptoms of physical distress. Continue to progress workloads to maintain intensity without signs/symptoms of physical distress. -- Continue to progress workloads to maintain intensity without signs/symptoms of physical distress.   Average METs -- 2.85 2.51 -- 3.06     Resistance Training   Training Prescription -- Yes  Yes -- Yes   Weight -- 5 lb  bands 5 lb -- 5 lb   Reps -- 10-15 10-15 -- 10-15     Interval Training   Interval Training -- No No -- No     Oxygen   Oxygen -- Continuous Continuous -- Continuous   Liters -- 1-2 1-2 -- 2     Treadmill   MPH -- -- 2.3 -- 2.3   Grade -- -- 0.5 -- 0.5   Minutes -- -- 15 -- 15   METs -- -- 2.92 -- 2.92     Recumbant Elliptical   Level -- -- -- -- 3   Minutes -- -- -- -- 15   METs -- -- -- -- 2.4     REL-XR   Level -- 3 2 -- 2   Minutes -- 15 15 -- 15   METs -- 4 3.7 -- 3     T5 Nustep   Level -- -- 3 -- --   Minutes -- -- 15 -- --   METs -- -- 1.9 -- --     Track   Laps -- 13 30 -- --   Minutes -- 15 15 -- --   METs -- 1.71 2.63 -- --     Home Exercise Plan   Plans to continue exercise at -- -- -- Home (comment)  walking, weights, staff videos Home (comment)  walking, weights, staff videos   Frequency -- -- -- Add 3 additional days to program exercise sessions. Add 3 additional days to program exercise sessions.   Initial Home Exercises Provided -- -- -- 07/01/22 07/01/22     Oxygen   Maintain Oxygen Saturation -- 88% or higher 88% or higher -- 88% or higher    Row Name 07/19/22 1400 08/16/22 0900 08/30/22 1000 09/13/22 1400       Response to Exercise   Blood Pressure (Admit) 132/70 112/68 118/64 118/60    Blood Pressure (Exercise) 130/68 -- -- --    Blood Pressure (Exit) 118/70 126/70 104/60 122/60    Heart Rate (Admit) 104 bpm 95 bpm 81 bpm 95 bpm    Heart Rate (Exercise) 113 bpm 149 bpm 122 bpm 109 bpm    Heart Rate (Exit) 113 bpm 99 bpm 100 bpm 101 bpm    Oxygen Saturation (Admit) 90 % 95 % 96 % 93 %    Oxygen Saturation (Exercise) 88 % 88 % 88 % 90 %    Oxygen Saturation (Exit) 96 % 95 % 96 % 96 %    Rating of Perceived Exertion (Exercise) 11 11 13 11     Perceived Dyspnea (Exercise) -- -- 2 --    Symptoms SOB SOB SOB SOB    Duration Continue with 30 min of aerobic exercise without signs/symptoms of physical distress.  Continue with 30 min of aerobic exercise without signs/symptoms of physical distress. Continue with  30 min of aerobic exercise without signs/symptoms of physical distress. Continue with 30 min of aerobic exercise without signs/symptoms of physical distress.    Intensity THRR unchanged THRR unchanged THRR unchanged THRR unchanged      Progression   Progression Continue to progress workloads to maintain intensity without signs/symptoms of physical distress. Continue to progress workloads to maintain intensity without signs/symptoms of physical distress. Continue to progress workloads to maintain intensity without signs/symptoms of physical distress. Continue to progress workloads to maintain intensity without signs/symptoms of physical distress.    Average METs 3.61 3.56 4.03 2.97      Resistance Training   Training Prescription Yes Yes Yes Yes    Weight 5 lb 5 lb 7 lb 7 lb    Reps 10-15 10-15 10-15 10-15      Interval Training   Interval Training No No No No      Oxygen   Oxygen Continuous Continuous Continuous Continuous    Liters 2 2 2 2       Treadmill   MPH 2.1 2 3  2.5    Grade 0 0.5 0.5 1.5    Minutes 15 15 15 15     METs 2.61 2.67 3.5 3.43      Recumbant Elliptical   Level -- -- 3 3    Minutes -- -- 15 15    METs -- -- 3.9 2.5      REL-XR   Level 5 5 7  --    Minutes 15 15 15  --    METs 4.6 4.6 5.1 --      Home Exercise Plan   Plans to continue exercise at Home (comment)  walking, weights, staff videos Home (comment)  walking, weights, staff videos Home (comment)  walking, weights, staff videos Home (comment)  walking, weights, staff videos    Frequency Add 3 additional days to program exercise sessions. Add 3 additional days to program exercise sessions. Add 3 additional days to program exercise sessions. Add 3 additional days to program exercise sessions.    Initial Home Exercises Provided 07/01/22 07/01/22 07/01/22 07/01/22      Oxygen   Maintain Oxygen Saturation 88% or  higher 88% or higher 88% or higher 88% or higher             Exercise Comments:   Exercise Comments     Row Name 06/03/22 1122           Exercise Comments First full day of exercise!  Patient was oriented to gym and equipment including functions, settings, policies, and procedures.  Patient's individual exercise prescription and treatment plan were reviewed.  All starting workloads were established based on the results of the 6 minute walk test done at initial orientation visit.  The plan for exercise progression was also introduced and progression will be customized based on patient's performance and goals.                Exercise Goals and Review:   Exercise Goals     Row Name 06/02/22 1134             Exercise Goals   Increase Physical Activity Yes       Intervention Provide advice, education, support and counseling about physical activity/exercise needs.;Develop an individualized exercise prescription for aerobic and resistive training based on initial evaluation findings, risk stratification, comorbidities and participant's personal goals.       Expected Outcomes Short Term: Attend rehab on a regular basis to increase amount of physical activity.;Long  Term: Exercising regularly at least 3-5 days a week.;Long Term: Add in home exercise to make exercise part of routine and to increase amount of physical activity.       Increase Strength and Stamina Yes       Intervention Provide advice, education, support and counseling about physical activity/exercise needs.;Develop an individualized exercise prescription for aerobic and resistive training based on initial evaluation findings, risk stratification, comorbidities and participant's personal goals.       Expected Outcomes Short Term: Increase workloads from initial exercise prescription for resistance, speed, and METs.;Short Term: Perform resistance training exercises routinely during rehab and add in resistance training at  home;Long Term: Improve cardiorespiratory fitness, muscular endurance and strength as measured by increased METs and functional capacity ( )       Able to understand and use rate of perceived exertion (RPE) scale Yes       Intervention Provide education and explanation on how to use RPE scale       Expected Outcomes Short Term: Able to use RPE daily in rehab to express subjective intensity level;Long Term:  Able to use RPE to guide intensity level when exercising independently       Able to understand and use Dyspnea scale Yes       Intervention Provide education and explanation on how to use Dyspnea scale       Expected Outcomes Short Term: Able to use Dyspnea scale daily in rehab to express subjective sense of shortness of breath during exertion;Long Term: Able to use Dyspnea scale to guide intensity level when exercising independently       Knowledge and understanding of Target Heart Rate Range (THRR) Yes       Intervention Provide education and explanation of THRR including how the numbers were predicted and where they are located for reference       Expected Outcomes Short Term: Able to state/look up THRR;Short Term: Able to use daily as guideline for intensity in rehab;Long Term: Able to use THRR to govern intensity when exercising independently       Able to check pulse independently Yes       Intervention Provide education and demonstration on how to check pulse in carotid and radial arteries.;Review the importance of being able to check your own pulse for safety during independent exercise       Expected Outcomes Short Term: Able to explain why pulse checking is important during independent exercise;Long Term: Able to check pulse independently and accurately       Understanding of Exercise Prescription Yes       Intervention Provide education, explanation, and written materials on patient's individual exercise prescription       Expected Outcomes Short Term: Able to explain program  exercise prescription;Long Term: Able to explain home exercise prescription to exercise independently                Exercise Goals Re-Evaluation :  Exercise Goals Re-Evaluation     Row Name 06/03/22 1122 06/07/22 1040 06/17/22 0947 06/21/22 1342 06/29/22 1023     Exercise Goal Re-Evaluation   Exercise Goals Review Able to understand and use rate of perceived exertion (RPE) scale;Able to understand and use Dyspnea scale;Knowledge and understanding of Target Heart Rate Range (THRR);Understanding of Exercise Prescription Understanding of Exercise Prescription;Increase Physical Activity;Increase Strength and Stamina Understanding of Exercise Prescription;Increase Physical Activity;Increase Strength and Stamina Understanding of Exercise Prescription;Increase Physical Activity;Increase Strength and Stamina Increase Physical Activity   Comments Reviewed RPE scale, THR  and program prescription with pt today.  Pt voiced understanding and was given a copy of goals to take home. Ashaz did well his first day of rehab. He was able to exercise on level 3 on the XR and was able to walk the track around 15 laps. He uses his oxygen on as needed basis, and used 2L when walking. We will monitor to ensure his oxygen saturations stay above 88% at all times. We will continue to monitor as he progresses in the program. Sallie is doing well in rehab. He feels that his breathing and stamina have improved since he began the program. He has began to improve his workloads and laps on the track. He has been going to the the [facility] for resistance training with handweights. We talked about possibly starting to walk the track some at the [facility] as well. We will continue to monitor his progress in the program. Kadian is doing well in the program. He has done well with the XR at level 2 and improved to level 3 on the T5 nustep. He also walked up to 30 laps on the track and has continued to walk at a speed of 2.3 mph with a 0.5%  incline on the treadmill. He has still been rating his shortness of breath as a 2 on the dyspnea scale during exercise. We will continue to monitor his progress in the program. Kaydrian was encouraged to start walking down to rehab with his oxygen pushing wheelchair.   Expected Outcomes Short: Use RPE daily to regulate intensity. Long: Follow program prescription in THR. ShorT: Continue to follow initial exercise prescription Long: Build up overall strength and stamina Short: Begin walking at the [facility] along with resistance training. Long: Continue to improve strength and stamina Short: Progressively increase treadmill workload. Long: Continue to improve strength and stamina. Short: Start to walk to rehab Long: Continue to improve stamina    Row Name 07/01/22 1012 07/05/22 1105 07/19/22 1432 08/02/22 1254 08/12/22 1034     Exercise Goal Re-Evaluation   Exercise Goals Review Increase Physical Activity;Increase Strength and Stamina;Able to understand and use rate of perceived exertion (RPE) scale;Able to understand and use Dyspnea scale;Knowledge and understanding of Target Heart Rate Range (THRR);Able to check pulse independently;Understanding of Exercise Prescription Increase Physical Activity;Increase Strength and Stamina;Understanding of Exercise Prescription Increase Physical Activity;Increase Strength and Stamina;Understanding of Exercise Prescription Increase Physical Activity;Increase Strength and Stamina;Understanding of Exercise Prescription Increase Physical Activity;Increase Strength and Stamina;Understanding of Exercise Prescription   Comments Reviewed home exercise with pt today.  Pt plans to walk and use weights with his weight machine at home for exercise.  We also talked about using staff videos as an option as well.  Reviewed THR, pulse, RPE, sign and symptoms, pulse oximetry and when to call 911 or MD.  Also discussed weather considerations and indoor options.  Pt voiced understanding.  Ryoma is doing well in rehab. He has stayed consistent at 2.3/1% workload on the treadmill and would benefit from increasing that. He tried out the REL for the first time and was able to work out at level 3. His oxygen saturations are staying above 88% and RPEs are staying appropriate. Will continue to monitor. Delano has only attended rehab once since the last review. During his one session he walked the treadmill at 2.1 mph with no incline. He also improved from level 2 to level 5 on the XR. He has continued to use 5 lb hand weights for resistance training as well. We  will continue to monitor his progress in the program. Kohyn has not attended rehab since 3/26, he had a procedure and has several appts leading up to that. Once patient is cleared to come back, we will encourage to maintain consistent attendance. Shneur is doing well in rehab.  He has only had a handful of session since last review due to not feeling well and other appointments.  We talked about making sure he works to get into rehab to see a better improvement.  We talked about importance of getting cardio in to train oxygen mobilization.  He is doing weights at home to build muscle.  We also talked about the importance of getting to rehab as he builds towards transplant.   Expected Outcomes Short: Start to add in more walking at home Long: Continue to exercise independently Short: Increase speed on treadmill Long: Continue to increase overall MET level and stamina Short: Return to regular attendance in the program. Long: Continue to improve strength and stamina. Short: Return to rehab when cleared Long: Graduate from [employer] Short: Regular attend at rehab Long: Add in more cardio at home.    Row Name 08/16/22 0935 08/30/22 1046 09/13/22 1411         Exercise Goal Re-Evaluation   Exercise Goals Review Increase Physical Activity;Increase Strength and Stamina;Understanding of Exercise Prescription Increase Physical  Activity;Increase Strength and Stamina;Understanding of Exercise Prescription Increase Physical Activity;Increase Strength and Stamina;Understanding of Exercise Prescription     Comments Derrol returned for two sessions after not attending rehab since 07/06/2022. During his two sessions he was able to work on the treadmill at a speed of 2 mph and 0.5% incline. He also worked at level 5 on the XR and continued to use 5 lb hand weights for resistance training. We will continue to monitor his progress in the program. Latif continues to do well in rehab. He increased his treadmill to a 3.0 speed and also increased to level 7 on the XR, working over 5 METS! He is now using 7 lb handweights as well. We will continue to monitor. Braxon has only attended rehab once since the last review. He was able to increase his incline on the treadmill to 1.5% while maintaining his speed at 2.5 mph. He also continued to work at level 3 on the recumbent elliptical. He consistently used 7 lb hand weights for resistance training as well. We will continue to monitor his progress in the program.     Expected Outcomes Short: Attend rehab regularly. Long: Continue to improve strength and stamina. Short: Continue to slowly increase incline on treadmill Long: Continue to increase overall MET level and stamina Short: Return to regular, consistent attendance in the program. Long: Continue to increase strength and stamina.              Discharge Exercise Prescription (Final Exercise Prescription Changes):  Exercise Prescription Changes - 09/13/22 1400       Response to Exercise   Blood Pressure (Admit) 118/60    Blood Pressure (Exit) 122/60    Heart Rate (Admit) 95 bpm    Heart Rate (Exercise) 109 bpm    Heart Rate (Exit) 101 bpm    Oxygen Saturation (Admit) 93 %    Oxygen Saturation (Exercise) 90 %    Oxygen Saturation (Exit) 96 %    Rating of Perceived Exertion (Exercise) 11    Symptoms SOB    Duration Continue  with 30 min of aerobic exercise without signs/symptoms of physical distress.  Intensity THRR unchanged      Progression   Progression Continue to progress workloads to maintain intensity without signs/symptoms of physical distress.    Average METs 2.97      Resistance Training   Training Prescription Yes    Weight 7 lb    Reps 10-15      Interval Training   Interval Training No      Oxygen   Oxygen Continuous    Liters 2      Treadmill   MPH 2.5    Grade 1.5    Minutes 15    METs 3.43      Recumbant Elliptical   Level 3    Minutes 15    METs 2.5      Home Exercise Plan   Plans to continue exercise at Home (comment)   walking, weights, staff videos   Frequency Add 3 additional days to program exercise sessions.    Initial Home Exercises Provided 07/01/22      Oxygen   Maintain Oxygen Saturation 88% or higher             Nutrition:  Target Goals: Understanding of nutrition guidelines, daily intake of sodium 1500mg , cholesterol 200mg , calories 30% from fat and 7% or less from saturated fats, daily to have 5 or more servings of fruits and vegetables.  Education: All About Nutrition: -Group instruction provided by verbal, written material, interactive activities, discussions, models, and posters to present general guidelines for heart healthy nutrition including fat, fiber, MyPlate, the role of sodium in heart healthy nutrition, utilization of the nutrition label, and utilization of this knowledge for meal planning. Follow up email sent as well. Written material given at graduation.   Biometrics:  Pre Biometrics - 06/02/22 1134       Pre Biometrics   Height 6' 5.5" (1.969 m)    Weight 153 lb 8 oz (69.6 kg)    Waist Circumference 28 inches    Hip Circumference 32 inches    Waist to Hip Ratio 0.88 %    BMI (Calculated) 17.96    Single Leg Stand 30 seconds              Nutrition Therapy Plan and Nutrition Goals:  Nutrition Therapy & Goals - 06/28/22  1333       Nutrition Therapy   Diet Heart healthy, low Na, high kal/high protein    Protein (specify units) 125g    Fiber 35 grams    Whole Grain Foods 3 servings    Saturated Fats 16 max. grams    Fruits and Vegetables 8 servings/day    Sodium 2 grams      Personal Nutrition Goals   Nutrition Goal ST: build meals with heart healthy fats, fiber, and protein. Cut down on salt used after cooking. Review paperwork and Increase involvement in meal planning/prepping.  LT: follow MyPlate guidelines, gain weight and muscle mass, limit Na <2g/day    Comments 47 y.o. M admitted to pulmonary rehab for ILD. 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 Reviewed relevant medications: humira,furosemide (pt reports no longer prescribed) , GI Cocktail (RULOX,LIDOCAINE), hydroxychloroquine, mycophenolate, pantoprazole, prednisone, breztri, vit D2 . B: grilled cheese sandwich or oatmeal L: big meal- chicken sub or philly cheese steak sub S:granola bar before gym and protein shake after gym D: pizza or meat or chicken with vegetables. Narayana reports not cooking or shopping and that usually  his girlfriend or mom will cook for him. His mom reports that they bake or grill and use olive oil, she uses mrs dash, but does not add salt while cooking. Taedyn reports using salt afterwards, but he is not sure how much he adds. He reports going out to eat 3-4x week. Drinks: sweet tea or fruit punch or sometimes soda. He reports that he has never made changes to his diet before. He reports that his weight has been stable - he lost weight (~60lbs over the course of 4-5 months) when he first started getting sick - he feels the medication dulled his appetite and he reports his appetetite has improved. Sufyan reports using serious mass protein, but onlya half a scoop - this provides 630 calories  and 25g of protein; discussed how this also contains many vitamins and minerals and cautioned against exceeding recommended doses. Encouraged to make sure any supplements are third party tested. Discussed general healthy eating and pulmonary MNT. Discussed high calorie/high protein diet and encouraged eating foods higher in fat to increase calories without increased volume - discussed how most of these fats should ideally come from healthy/nutritionally dense sources; peanut butter, liquid plant oils, avocado, and whole fat greek yogurt. Discussed that he could have another scoop of protein powder (2 scoops total/day) for another snack or sip on it in between meals. Encouraged him to become more involved in the cooking and meal planning/shopping process.      Intervention Plan   Intervention Prescribe, educate and counsel regarding individualized specific dietary modifications aiming towards targeted core components such as weight, hypertension, lipid management, diabetes, heart failure and other comorbidities.    Expected Outcomes Short Term Goal: Understand basic principles of dietary content, such as calories, fat, sodium, cholesterol and nutrients.;Short Term Goal: A plan has been developed with personal nutrition goals set during dietitian appointment.;Long Term Goal: Adherence to prescribed nutrition plan.             Nutrition Assessments:  MEDIFICTS Score Key: ?70 Need to make dietary changes  40-70 Heart Healthy Diet ? 40 Therapeutic Level Cholesterol Diet  Flowsheet Row Pulmonary Rehab from 06/08/2022 in [facility]  Picture Your Plate Total Score on Admission 52      Picture Your Plate Scores: <16 Unhealthy dietary pattern with much room for improvement. 41-50 Dietary pattern unlikely to meet recommendations for good health and room for improvement. 51-60 More healthful dietary pattern, with some room for improvement.  >60 Healthy dietary pattern, although  there may be some specific behaviors that could be improved.   Nutrition Goals Re-Evaluation:  Nutrition Goals Re-Evaluation     Row Name 06/17/22 1003 08/12/22 1047           Goals   Nutrition Goal -- ST: build meals with heart healthy fats, fiber, and protein. Cut down on salt used after cooking. Review paperwork and Increase involvement in meal planning/prepping.  LT: follow MyPlate guidelines, gain weight and muscle mass, limit Na <2g/day      Comment Trad is looking forward to meeting with the RD and has a meeting scheduled for next week. He is looking forward to talking to the RD about healthy eating patterns to help him gain weight. Riquelme is working on his diet.  He has been able to increase some of his protein intake to build muscle.  He is not adding as much salt as he did before.  He continues to work on meal planning.  Expected Outcome Short: Meet with RD. Long: Implement dietary patterns discussed with RD. Short: Continue to increase protein Long: Conitnue to improve diet               Nutrition Goals Discharge (Final Nutrition Goals Re-Evaluation):  Nutrition Goals Re-Evaluation - 08/12/22 1047       Goals   Nutrition Goal ST: build meals with heart healthy fats, fiber, and protein. Cut down on salt used after cooking. Review paperwork and Increase involvement in meal planning/prepping.  LT: follow MyPlate guidelines, gain weight and muscle mass, limit Na <2g/day    Comment Jamarr is working on his diet.  He has been able to increase some of his protein intake to build muscle.  He is not adding as much salt as he did before.  He continues to work on meal planning.    Expected Outcome Short: Continue to increase protein Long: Conitnue to improve diet             Psychosocial: Target Goals: Acknowledge presence or absence of significant depression and/or stress, maximize coping skills, provide positive support system. Participant is able to verbalize types  and ability to use techniques and skills needed for reducing stress and depression.   Education: Stress, Anxiety, and Depression - Group verbal and visual presentation to define topics covered.  Reviews how body is impacted by stress, anxiety, and depression.  Also discusses healthy ways to reduce stress and to treat/manage anxiety and depression.  Written material given at graduation. Flowsheet Row Pulmonary Rehab from 09/02/2022 in [facility]  Date 09/02/22  Educator [facility]  Instruction Review Code 1- [employer] Understanding       Education: Sleep Hygiene -Provides group verbal and written instruction about how sleep can affect your health.  Define sleep hygiene, discuss sleep cycles and impact of sleep habits. Review good sleep hygiene tips.    Initial Review & Psychosocial Screening:  Initial Psych Review & Screening - 05/25/22 1308       Initial Review   Current issues with Current Stress Concerns    Source of Stress Concerns Unable to perform yard/household activities;Unable to participate in former interests or hobbies      Family Dynamics   Good Support System? Yes   mom     Barriers   Psychosocial barriers to participate in program There are no identifiable barriers or psychosocial needs.;The patient should benefit from training in stress management and relaxation.      Screening Interventions   Interventions Encouraged to exercise;Provide feedback about the scores to participant;To provide support and resources with identified psychosocial needs    Expected Outcomes Long Term Goal: Stressors or current issues are controlled or eliminated.;Short Term goal: Utilizing psychosocial counselor, staff and physician to assist with identification of specific Stressors or current issues interfering with healing process. Setting desired goal for each stressor or current issue identified.;Short Term goal: Identification and review with participant of any Quality of  Life or Depression concerns found by scoring the questionnaire.;Long Term goal: The participant improves quality of Life and PHQ9 Scores as seen by post scores and/or verbalization of changes             Quality of Life Scores:  Scores of 19 and below usually indicate a poorer quality of life in these areas.  A difference of  2-3 points is a clinically meaningful difference.  A difference of 2-3 points in the total score of the Quality of Life Index has been  associated with significant improvement in overall quality of life, self-image, physical symptoms, and general health in studies assessing change in quality of life.  PHQ-9: Review Flowsheet       06/29/2022 06/02/2022 10/22/2021  Depression screen PHQ 2/9  Decreased Interest 1 2 0  Down, Depressed, Hopeless 0 1 0  PHQ - 2 Score 1 3 0  Altered sleeping 1 1 -  Tired, decreased energy 1 2 -  Change in appetite 0 1 -  Feeling bad or failure about yourself  0 1 -  Trouble concentrating 0 0 -  Moving slowly or fidgety/restless 0 1 -  Suicidal thoughts 0 0 -  PHQ-9 Score 3 9 -  Difficult doing work/chores Somewhat difficult Very difficult -   Interpretation of Total Score  Total Score Depression Severity:  1-4 = Minimal depression, 5-9 = Mild depression, 10-14 = Moderate depression, 15-19 = Moderately severe depression, 20-27 = Severe depression   Psychosocial Evaluation and Intervention:  Psychosocial Evaluation - 05/25/22 1318       Psychosocial Evaluation & Interventions   Interventions Encouraged to exercise with the program and follow exercise prescription    Comments Mr. Abbs is coming to pulmonary rehab with worsening ILD. He reports that since he started getting sick a few years ago, he hasn't really felt like doing much. He finds himself lying around more and getting more winded when he does get up to do things. He used to work in a [employer] and enjoy exercising. He wants to get back some of the strength he has lost.  He knows it will be a journey getting back to a more active lifestyle and is wary of his breathing's reaction, but he wants to put in the work. He enjoys playing video games for fun and has a good support system.    Expected Outcomes Short: attend pulmonary rehab for education and exercise. Long: develop and maintain positive self care habits.    Continue Psychosocial Services  Follow up required by staff             Psychosocial Re-Evaluation:  Psychosocial Re-Evaluation     Row Name 06/17/22 1610 06/29/22 1010 08/12/22 1042         Psychosocial Re-Evaluation   Current issues with Current Sleep Concerns;Current Psychotropic Meds Current Psychotropic Meds Current Psychotropic Meds     Comments Royse denies any current stressors at this time. He reports that his sleep has improved since starting some of his medications, however he does want to become too dependent on the medication to help him sleep. He states that exercise has been a good avenue of stress relief for him. He also reports having a good support system made up by his parents, kids, and girlfriend. Junah PHQ score decreased from a 9 to a 3! Continues ti stay compliant with medications. Exercise is helping him take his mind off of things. Will continue to monitor. Ariya is doing well mentally.  His biggest stressor is his health.  He is driving some and was encouraged to drive himself to rehab.  He is sleeping well.  He is still getting SOB whihch limits what he can do.     Expected Outcomes Short: Continue to exercise for mental boost. Long: Maintain positive outlook. Short: Continue exercise for mental health Long: Continue to maintain positive attitude Short; Continue to exercise more to help with breathing Long: Conitnue to stay positiv e     Interventions Encouraged to attend Pulmonary Rehabilitation for the exercise  Encouraged to attend Pulmonary Rehabilitation for the exercise Encouraged to attend Pulmonary  Rehabilitation for the exercise     Continue Psychosocial Services  Follow up required by staff Follow up required by staff Follow up required by staff              Psychosocial Discharge (Final Psychosocial Re-Evaluation):  Psychosocial Re-Evaluation - 08/12/22 1042       Psychosocial Re-Evaluation   Current issues with Current Psychotropic Meds    Comments Ra is doing well mentally.  His biggest stressor is his health.  He is driving some and was encouraged to drive himself to rehab.  He is sleeping well.  He is still getting SOB whihch limits what he can do.    Expected Outcomes Short; Continue to exercise more to help with breathing Long: Conitnue to stay positiv e    Interventions Encouraged to attend Pulmonary Rehabilitation for the exercise    Continue Psychosocial Services  Follow up required by staff             Education: Education Goals: Education classes will be provided on a weekly basis, covering required topics. Participant will state understanding/return demonstration of topics presented.  Learning Barriers/Preferences:   General Pulmonary Education Topics:  Infection Prevention: - Provides verbal and written material to individual with discussion of infection control including proper hand washing and proper equipment cleaning during exercise session. Flowsheet Row Pulmonary Rehab from 09/02/2022 in [facility] Cardiac and Pulmonary Rehab  Date 06/02/22  Educator [facility]  Instruction Review Code 1- Verbalizes Understanding       Falls Prevention: - Provides verbal and written material to individual with discussion of falls prevention and safety. Flowsheet Row Pulmonary Rehab from 09/02/2022 in [facility] Cardiac and Pulmonary Rehab  Date 06/02/22  Educator [facility]  Instruction Review Code 1- Verbalizes Understanding       Chronic Lung Disease Review: - Group verbal instruction with posters, models, PowerPoint presentations and videos,  to review new updates, new  respiratory medications, new advancements in procedures and treatments. Providing information on websites and "800" numbers for continued self-education. Includes information about supplement oxygen, available portable oxygen systems, continuous and intermittent flow rates, oxygen safety, concentrators, and Medicare reimbursement for oxygen. Explanation of Pulmonary Drugs, including class, frequency, complications, importance of spacers, rinsing mouth after steroid MDI's, and proper cleaning methods for nebulizers. Review of basic lung anatomy and physiology related to function, structure, and complications of lung disease. Review of risk factors. Discussion about methods for diagnosing sleep apnea and types of masks and machines for OSA. Includes a review of the use of types of environmental controls: home humidity, furnaces, filters, dust mite/pet prevention, HEPA vacuums. Discussion about weather changes, air quality and the benefits of nasal washing. Instruction on Warning signs, infection symptoms, calling MD promptly, preventive modes, and value of vaccinations. Review of effective airway clearance, coughing and/or vibration techniques. Emphasizing that all should Create an Action Plan. Written material given at graduation. Flowsheet Row Pulmonary Rehab from 09/02/2022 in [facility] Cardiac and Pulmonary Rehab  Date 06/17/22  Educator [facility]  Instruction Review Code 1- Verbalizes Understanding       AED/CPR: - Group verbal and written instruction with the use of models to demonstrate the basic use of the AED with the basic ABC's of resuscitation.    Anatomy and Cardiac Procedures: - Group verbal and visual presentation and models provide information about basic cardiac anatomy and function. Reviews the testing methods done to diagnose heart disease and the outcomes  of the test results. Describes the treatment choices: Medical Management, Angioplasty, or Coronary Bypass Surgery for treating various heart  conditions including Myocardial Infarction, Angina, Valve Disease, and Cardiac Arrhythmias.  Written material given at graduation.   Medication Safety: - Group verbal and visual instruction to review commonly prescribed medications for heart and lung disease. Reviews the medication, class of the drug, and side effects. Includes the steps to properly store meds and maintain the prescription regimen.  Written material given at graduation.   Other: -Provides group and verbal instruction on various topics (see comments)   Knowledge Questionnaire Score:  Knowledge Questionnaire Score - 06/08/22 0939       Knowledge Questionnaire Score   Pre Score 13/16              Core Components/Risk Factors/Patient Goals at Admission:  Personal Goals and Risk Factors at Admission - 06/02/22 1135       Core Components/Risk Factors/Patient Goals on Admission    Weight Management Yes;Weight Gain    Intervention Weight Management: Develop a combined nutrition and exercise program designed to reach desired caloric intake, while maintaining appropriate intake of nutrient and fiber, sodium and fats, and appropriate energy expenditure required for the weight goal.;Weight Management: Provide education and appropriate resources to help participant work on and attain dietary goals.    Admit Weight 153 lb 8 oz (69.6 kg)    Goal Weight: Short Term 160 lb (72.6 kg)    Goal Weight: Long Term 165 lb (74.8 kg)    Expected Outcomes Long Term: Adherence to nutrition and physical activity/exercise program aimed toward attainment of established weight goal;Short Term: Continue to assess and modify interventions until short term weight is achieved;Weight Gain: Understanding of general recommendations for a high calorie, high protein meal plan that promotes weight gain by distributing calorie intake throughout the day with the consumption for 4-5 meals, snacks, and/or supplements;Weight Maintenance: Understanding of the  daily nutrition guidelines, which includes 25-35% calories from fat, 7% or less cal from saturated fats, less than 200mg  cholesterol, less than 1.5gm of sodium, & 5 or more servings of fruits and vegetables daily    Improve shortness of breath with ADL's Yes    Intervention Provide education, individualized exercise plan and daily activity instruction to help decrease symptoms of SOB with activities of daily living.    Expected Outcomes Short Term: Improve cardiorespiratory fitness to achieve a reduction of symptoms when performing ADLs;Long Term: Be able to perform more ADLs without symptoms or delay the onset of symptoms    Increase knowledge of respiratory medications and ability to use respiratory devices properly  Yes    Intervention Provide education and demonstration as needed of appropriate use of medications, inhalers, and oxygen therapy.    Expected Outcomes Short Term: Achieves understanding of medications use. Understands that oxygen is a medication prescribed by physician. Demonstrates appropriate use of inhaler and oxygen therapy.;Long Term: Maintain appropriate use of medications, inhalers, and oxygen therapy.    Diabetes Yes    Intervention Provide education about signs/symptoms and action to take for hypo/hyperglycemia.;Provide education about proper nutrition, including hydration, and aerobic/resistive exercise prescription along with prescribed medications to achieve blood glucose in normal ranges: Fasting glucose 65-99 mg/dL    Expected Outcomes Short Term: Participant verbalizes understanding of the signs/symptoms and immediate care of hyper/hypoglycemia, proper foot care and importance of medication, aerobic/resistive exercise and nutrition plan for blood glucose control.;Long Term: Attainment of HbA1C < 7%.    Hypertension Yes  Intervention Provide education on lifestyle modifcations including regular physical activity/exercise, weight management, moderate sodium restriction and  increased consumption of fresh fruit, vegetables, and low fat dairy, alcohol moderation, and smoking cessation.;Monitor prescription use compliance.    Expected Outcomes Short Term: Continued assessment and intervention until BP is < 140/71mm HG in hypertensive participants. < 130/56mm HG in hypertensive participants with diabetes, heart failure or chronic kidney disease.;Long Term: Maintenance of blood pressure at goal levels.             Education:Diabetes - Individual verbal and written instruction to review signs/symptoms of diabetes, desired ranges of glucose level fasting, after meals and with exercise. Acknowledge that pre and post exercise glucose checks will be done for 3 sessions at entry of program. Flowsheet Row Pulmonary Rehab from 09/02/2022 in [facility] Cardiac and Pulmonary Rehab  Date 05/25/22  Educator [facility]  Instruction Review Code 1- Verbalizes Understanding       Know Your Numbers and Heart Failure: - Group verbal and visual instruction to discuss disease risk factors for cardiac and pulmonary disease and treatment options.  Reviews associated critical values for Overweight/Obesity, Hypertension, Cholesterol, and Diabetes.  Discusses basics of heart failure: signs/symptoms and treatments.  Introduces Heart Failure Zone chart for action plan for heart failure.  Written material given at graduation. Flowsheet Row Pulmonary Rehab from 09/02/2022 in [facility] Cardiac and Pulmonary Rehab  Date 06/10/22  Educator [facility]  Instruction Review Code 1- Verbalizes Understanding       Core Components/Risk Factors/Patient Goals Review:   Goals and Risk Factor Review     Row Name 06/17/22 [phone]             Core Components/Risk Factors/Patient Goals Review   Personal Goals Review Weight Management/Obesity;Diabetes;Hypertension       Review Pollux states that he wants to gain some weight and has begun to eat more to meet his weight goal. He would like to gain about 20 lbs with a weight goal  of 180 lb. He has also been checking his blood sugars and reports that they are within normal ranges. He does not own a BP cuff so he has not been checking his blood pressures at home. His BP readings have been within normal ranges while at rehab.       Expected Outcomes Short: Continue to monitor blood sugars. Long: Continue to work towards weight goal.                Core Components/Risk Factors/Patient Goals at Discharge (Final Review):   Goals and Risk Factor Review - 06/17/22 0956       Core Components/Risk Factors/Patient Goals Review   Personal Goals Review Weight Management/Obesity;Diabetes;Hypertension    Review Abdulsalam states that he wants to gain some weight and has begun to eat more to meet his weight goal. He would like to gain about 20 lbs with a weight goal of 180 lb. He has also been checking his blood sugars and reports that they are within normal ranges. He does not own a BP cuff so he has not been checking his blood pressures at home. His BP readings have been within normal ranges while at rehab.    Expected Outcomes Short: Continue to monitor blood sugars. Long: Continue to work towards weight goal.             ITP Comments:  ITP Comments     Row Name 05/25/22 1323 06/02/22 1119 06/03/22 1122 06/28/22 1333 06/30/22 0815   ITP Comments Initial phone  call completed. Diagnosis can be found in [facility] 2/5. EP Orientation scheduled for Wednesday, 2/21 at 2:30. Completed and gym orientation. Initial ITP created and sent for review to Dr. Jinny Sanders, Medical Director.  He will return his paperwork on his first day. First full day of exercise!  Patient was oriented to gym and equipment including functions, settings, policies, and procedures.  Patient's individual exercise prescription and treatment plan were reviewed.  All starting workloads were established based on the results of the 6 minute walk test done at initial orientation visit.  The plan for exercise  progression was also introduced and progression will be customized based on patient's performance and goals. Completed Initial RD Consultation 30 Day review completed. Medical Director ITP review done, changes made as directed, and signed approval by Medical Director.    Row Name 07/27/22 [phone] 07/28/22 0841 08/25/22 0823 09/16/22 1412 09/22/22 0830   ITP Comments Patient's mother called to let us know that Reinaldo is having a procedure done tomorrow and has prep today.  He will also be out again on Thursday.  He has not attended since 07/06/22 for various reasons.  We have not been able to assess for goals this round. 30 day review completed. ITP sent to Dr. Jinny Sanders, Medical Director of  Pulmonary Rehab. Continue with ITP unless changes are made by physician. 30 Day review completed. Medical Director ITP review done, changes made as directed, and signed approval by Medical Director. Pt's mom called this morning and requested for him to come this afternoon.  Pt did not show for appt, called to check on him, he had just woken up thinking appt was tomorrow.  He did reschedule class for afternoon starting on Monday.  If he does not attend Monday, we will not be able to assess his goals.  We also discussed needing to improve his attendance in order to retain his spot in rehab. 30 Day review completed. Medical Director ITP review done, changes made as directed, and signed approval by Medical Director.            Comments:

## 2022-09-23 ENCOUNTER — Ambulatory Visit: Payer: Medicaid Other

## 2022-09-27 ENCOUNTER — Ambulatory Visit: Payer: Medicaid Other

## 2022-09-29 ENCOUNTER — Encounter: Payer: Self-pay | Admitting: *Deleted

## 2022-09-29 ENCOUNTER — Ambulatory Visit: Payer: Medicaid Other

## 2022-09-29 NOTE — Progress Notes (Unsigned)
Sean [name] was admitted on 09/29/2022. Will need clearance to return to rehab.

## 2022-09-30 ENCOUNTER — Ambulatory Visit: Payer: Medicaid Other | Admitting: *Deleted

## 2022-10-04 ENCOUNTER — Ambulatory Visit: Payer: Medicaid Other

## 2022-10-06 ENCOUNTER — Ambulatory Visit: Payer: Medicaid Other

## 2022-10-07 ENCOUNTER — Encounter: Payer: Self-pay | Admitting: *Deleted

## 2022-10-07 ENCOUNTER — Ambulatory Visit: Payer: Medicaid Other | Admitting: *Deleted

## 2022-10-07 ENCOUNTER — Telehealth: Payer: Self-pay | Admitting: *Deleted

## 2022-10-07 NOTE — Telephone Encounter (Signed)
Called and spoke with pt. He was discharge from hospital 6/21 for pleural effusion and tachycardia. Per discharge note pt clear to restart pulmonary rehab. Pt states he is feeling better and will plan to attend his next scheduled session in rehab Monday 7/1.

## 2022-10-11 ENCOUNTER — Ambulatory Visit: Payer: Medicaid Other

## 2022-10-13 ENCOUNTER — Telehealth: Payer: Self-pay | Admitting: *Deleted

## 2022-10-13 ENCOUNTER — Encounter: Payer: Self-pay | Admitting: *Deleted

## 2022-10-13 ENCOUNTER — Ambulatory Visit: Payer: Medicaid Other

## 2022-10-13 NOTE — Telephone Encounter (Signed)
Pt no show for pulmonary rehab 6/27, 7/1 and 7/3. Letter mailed to pt. Discharge date set for 10/29/22. Pt made aware.

## 2022-10-18 ENCOUNTER — Ambulatory Visit: Payer: Medicaid Other

## 2022-10-19 ENCOUNTER — Encounter: Payer: Self-pay | Admitting: *Deleted

## 2022-10-19 DIAGNOSIS — J849 Interstitial pulmonary disease, unspecified: Secondary | ICD-10-CM

## 2022-10-19 NOTE — Progress Notes (Signed)
Pulmonary Individual Treatment Plan  Patient Details  Name: Sean [name] MRN: [id number] Date of Birth: May 21, 1975 Referring Provider:   Flowsheet Row Pulmonary Rehab from 06/02/2022 in [facility] Cardiac and Pulmonary Rehab  Referring Provider Josph Macho MD       Initial Encounter Date:  Flowsheet Row Pulmonary Rehab from 06/02/2022 in [facility] Cardiac and Pulmonary Rehab  Date 06/02/22       Visit Diagnosis: ILD (interstitial lung disease) (HCC)  Patient's Home Medications on Admission:  Current Outpatient Medications:    acetaminophen (TYLENOL) 500 MG tablet, Take by mouth., Disp: , Rfl:    albuterol (VENTOLIN HFA) 108 (90 Base) MCG/ACT inhaler, Inhale 2 puffs into the lungs every 6 (six) hours as needed for wheezing or shortness of breath., Disp: 8 g, Rfl: 2   blood glucose meter kit and supplies KIT, Dispense based on patient and insurance preference. Use up to four times daily as directed., Disp: 1 each, Rfl: 0   blood glucose meter kit and supplies, Dispense based on patient and insurance preference. Use up to four times daily as directed. (FOR ICD-10 E10.9, E11.9)., Disp: 1 each, Rfl: 0   Blood Glucose Monitoring Suppl (CONTOUR NEXT ONE) KIT, 4 (four) times daily., Disp: , Rfl:    BREZTRI AEROSPHERE 160-9-4.8 MCG/ACT AERO, Inhale 2 puffs into the lungs 2 (two) times daily., Disp: 10.7 g, Rfl: 0   chlorhexidine (PERIDEX) 0.12 % solution, 15 mLs 2 (two) times daily. (Patient not taking: Reported on 05/25/2022), Disp: , Rfl:    Cholecalciferol (VITAMIN D-1000 MAX ST) 25 MCG (1000 UT) tablet, Take by mouth., Disp: , Rfl:    clobetasol ointment (TEMOVATE) 0.05 %, Apply topically., Disp: , Rfl:    furosemide (LASIX) 20 MG tablet, Take 1 tablet (20 mg total) by mouth daily., Disp: 30 tablet, Rfl: 0   hydroxychloroquine (PLAQUENIL) 200 MG tablet, Place 1 tablet (200 mg total) into feeding tube 2 (two) times daily. (Patient not taking: Reported on 05/25/2022), Disp: , Rfl:    insulin aspart  (NOVOLOG) 100 UNIT/ML FlexPen, Inject 1-9 Units into the skin every 6 (six) hours. CBG 70 - 120: 0 units CBG 121 - 150: 1 unit CBG 151 - 200: 2 units CBG 201 - 250: 3 units CBG 251 - 300: 5 units CBG 301 - 350: 7 units CBG 351 - 400: 9 units CBG > 400: call MD and obtain STAT lab verification, Disp: 15 mL, Rfl: 11   Insulin Pen Needle (PEN NEEDLES 3/16") 31G X 5 MM MISC, 1 pen. by Does not apply route every 6 (six) hours., Disp: 200 each, Rfl: 1   lidocaine (XYLOCAINE) 2 % solution, Use as directed 15 mLs in the mouth or throat every 6 (six) hours as needed (mouth/throat pain). (Patient not taking: Reported on 05/25/2022), Disp: 100 mL, Rfl: 0   meloxicam (MOBIC) 15 MG tablet, Take by mouth., Disp: , Rfl:    Multiple Vitamins-Minerals (SUPER THERA VITE M) TABS, Take 1 tablet by mouth daily., Disp: , Rfl:    NONFORMULARY OR COMPOUNDED ITEM, LIDOCAINE/MAALOX XS 1:1 (Patient not taking: Reported on 05/25/2022), Disp: , Rfl:    pantoprazole (PROTONIX) 40 MG tablet, Take 1 tablet (40 mg total) by mouth daily. (Patient not taking: Reported on 05/25/2022), Disp: 30 tablet, Rfl: 0   tacrolimus (PROTOPIC) 0.1 % ointment, Apply topically 2 (two) times daily., Disp: , Rfl:    thiamine (VITAMIN B1) 100 MG tablet, Take by mouth., Disp: , Rfl:    triamcinolone cream (KENALOG)  0.1 %, Apply topically 2 (two) times daily., Disp: , Rfl:    Vitamin D 12.5 MCG/0.25ML LIQD, Give 1 mL by tube daily. (Patient not taking: Reported on 05/25/2022), Disp: 36 mL, Rfl: 1  Past Medical History: Past Medical History:  Diagnosis Date   H/O immunosuppressive therapy    Hyperpigmentation of skin    Hyperpigmentation rash/vesicles of the palm and face   Neutropenia (HCC)    RA (rheumatoid arthritis) (HCC)     Tobacco Use: Social History   Tobacco Use  Smoking Status Former   Types: Cigarettes  Smokeless Tobacco Never    Labs: Review Flowsheet  More data may exist      Latest Ref Rng & Units 07/26/2021 09/10/2021 01/12/2022  06/03/2022 08/12/2022  Labs for ITP Cardiac and Pulmonary Rehab  Cholestrol 0 - 200 mg/dL - - 409  811  914   LDL (calc) 0 - 99 mg/dL - - 91  64  78   HDL-C >40 mg/dL - - 38  35  37   Trlycerides <150 mg/dL 782  - 956  85  213   Hemoglobin A1c 4.8 - 5.6 % - 11.9  6.4  5.5  5.4      Pulmonary Assessment Scores:  Pulmonary Assessment Scores     Row Name 06/02/22 1137 06/08/22 0938       ADL UCSD   ADL Phase Exit Entry    SOB Score total -- 75    Rest -- 0    Walk -- 2    Stairs -- 5    Bath -- 3    Dress -- 3    Shop -- 4      CAT Score   CAT Score -- 16      mMRC Score   mMRC Score 4 --             UCSD: Self-administered rating of dyspnea associated with activities of daily living (ADLs) 6-point scale (0 = "not at all" to 5 = "maximal or unable to do because of breathlessness")  Scoring Scores range from 0 to 120.  Minimally important difference is 5 units  CAT: CAT can identify the health impairment of COPD patients and is better correlated with disease progression.  CAT has a scoring range of zero to 40. The CAT score is classified into four groups of low (less than 10), medium (10 - 20), high (21-30) and very high (31-40) based on the impact level of disease on health status. A CAT score over 10 suggests significant symptoms.  A worsening CAT score could be explained by an exacerbation, poor medication adherence, poor inhaler technique, or progression of COPD or comorbid conditions.  CAT MCID is 2 points  mMRC: mMRC (Modified Medical Research Council) Dyspnea Scale is used to assess the degree of baseline functional disability in patients of respiratory disease due to dyspnea. No minimal important difference is established. A decrease in score of 1 point or greater is considered a positive change.   Pulmonary Function Assessment:   Exercise Target Goals: Exercise Program Goal: Individual exercise prescription set using results from initial 6 min walk test and  THRR while considering  patient's activity barriers and safety.   Exercise Prescription Goal: Initial exercise prescription builds to 30-45 minutes a day of aerobic activity, 2-3 days per week.  Home exercise guidelines will be given to patient during program as part of exercise prescription that the participant will acknowledge.  Education: Aerobic Exercise: - Group verbal  and visual presentation on the components of exercise prescription. Introduces F.I.T.T principle from ACSM for exercise prescriptions.  Reviews F.I.T.T. principles of aerobic exercise including progression. Written material given at graduation.   Education: Resistance Exercise: - Group verbal and visual presentation on the components of exercise prescription. Introduces F.I.T.T principle from ACSM for exercise prescriptions  Reviews F.I.T.T. principles of resistance exercise including progression. Written material given at graduation.    Education: Exercise & Equipment Safety: - Individual verbal instruction and demonstration of equipment use and safety with use of the equipment. Flowsheet Row Pulmonary Rehab from 09/02/2022 in [facility] Cardiac and Pulmonary Rehab  Date 06/02/22  Educator [facility]  Instruction Review Code 1- Verbalizes Understanding       Education: Exercise Physiology & General Exercise Guidelines: - Group verbal and written instruction with models to review the exercise physiology of the cardiovascular system and associated critical values. Provides general exercise guidelines with specific guidelines to those with heart or lung disease.  Flowsheet Row Pulmonary Rehab from 09/02/2022 in [facility] Cardiac and Pulmonary Rehab  Date 07/01/22  Educator [facility]  Instruction Review Code 1- Verbalizes Understanding       Education: Flexibility, Balance, Mind/Body Relaxation: - Group verbal and visual presentation with interactive activity on the components of exercise prescription. Introduces F.I.T.T principle from ACSM  for exercise prescriptions. Reviews F.I.T.T. principles of flexibility and balance exercise training including progression. Also discusses the mind body connection.  Reviews various relaxation techniques to help reduce and manage stress (i.e. Deep breathing, progressive muscle relaxation, and visualization). Balance handout provided to take home. Written material given at graduation.   Activity Barriers & Risk Stratification:  Activity Barriers & Cardiac Risk Stratification - 06/02/22 1127       Activity Barriers & Cardiac Risk Stratification   Activity Barriers Shortness of Breath;Deconditioning;Muscular Weakness             6 Minute Walk:  6 Minute Walk     Row Name 06/02/22 1125         6 Minute Walk   Phase Initial     Distance 1261 feet     Walk Time 6 minutes     # of Rest Breaks 0     MPH 2.39     METS 5.42     RPE 13     Perceived Dyspnea  3     VO2 Peak 18.99     Symptoms Yes (comment)     Comments SOB     Resting HR 102 bpm     Resting BP 104/58     Resting Oxygen Saturation  87 %     Exercise Oxygen Saturation  during 6 min walk 79 %     Max Ex. HR 125 bpm     Max Ex. BP 136/74     2 Minute Post BP 122/66       Interval HR   1 Minute HR 113     2 Minute HR 117     3 Minute HR 122     4 Minute HR 125     5 Minute HR 125     6 Minute HR 125     2 Minute Post HR 107     Interval Heart Rate? Yes       Interval Oxygen   Interval Oxygen? Yes     Baseline Oxygen Saturation % 93 %     1 Minute Oxygen Saturation % 87 %     1 Minute  Liters of Oxygen 0 L  Room Air     2 Minute Oxygen Saturation % 86 %     2 Minute Liters of Oxygen 0 L     3 Minute Oxygen Saturation % 81 %     3 Minute Liters of Oxygen 0 L     4 Minute Oxygen Saturation % 80 %     4 Minute Liters of Oxygen 0 L     5 Minute Oxygen Saturation % 80 %     5 Minute Liters of Oxygen 0 L     6 Minute Oxygen Saturation % 81 %  79% at 5:30     6 Minute Liters of Oxygen 0 L     2 Minute Post  Oxygen Saturation % 90 %     2 Minute Post Liters of Oxygen 0 L             Oxygen Initial Assessment:  Oxygen Initial Assessment - 06/02/22 1136       Home Oxygen   Home Oxygen Device E-Tanks;Home Concentrator    Sleep Oxygen Prescription None   2L when saturations get low at night as well   Home Exercise Oxygen Prescription None   encouraged to use   Home Resting Oxygen Prescription None    Compliance with Home Oxygen Use Yes      Initial 6 min Walk   Oxygen Used None      Program Oxygen Prescription   Program Oxygen Prescription Continuous;E-Tanks    Liters per minute 2    Comments use 1-2L for exercise to avoid desaturations with exercise      Intervention   Short Term Goals To learn and exhibit compliance with exercise, home and travel O2 prescription;To learn and understand importance of monitoring SPO2 with pulse oximeter and demonstrate accurate use of the pulse oximeter.;To learn and understand importance of maintaining oxygen saturations>88%;To learn and demonstrate proper pursed lip breathing techniques or other breathing techniques. ;To learn and demonstrate proper use of respiratory medications    Long  Term Goals Exhibits compliance with exercise, home  and travel O2 prescription;Verbalizes importance of monitoring SPO2 with pulse oximeter and return demonstration;Maintenance of O2 saturations>88%;Exhibits proper breathing techniques, such as pursed lip breathing or other method taught during program session;Compliance with respiratory medication;Demonstrates proper use of MDI's             Oxygen Re-Evaluation:  Oxygen Re-Evaluation     Row Name 06/03/22 1123 06/17/22 0959 08/12/22 1540         Program Oxygen Prescription   Program Oxygen Prescription -- Continuous;E-Tanks Continuous;E-Tanks     Liters per minute -- 2 --     Comments -- use 1-2L for exercise to avoid desaturations with exercise use 1-2L for exercise to avoid desaturations with exercise        Home Oxygen   Home Oxygen Device -- E-Tanks;Home Concentrator E-Tanks;Home Concentrator     Sleep Oxygen Prescription -- None None     Home Exercise Oxygen Prescription -- None None     Home Resting Oxygen Prescription -- None None     Compliance with Home Oxygen Use -- Yes Yes       Goals/Expected Outcomes   Short Term Goals -- To learn and exhibit compliance with exercise, home and travel O2 prescription;To learn and understand importance of monitoring SPO2 with pulse oximeter and demonstrate accurate use of the pulse oximeter.;To learn and understand importance of maintaining oxygen saturations>88%;To learn and demonstrate  proper pursed lip breathing techniques or other breathing techniques. ;To learn and demonstrate proper use of respiratory medications To learn and exhibit compliance with exercise, home and travel O2 prescription;To learn and understand importance of monitoring SPO2 with pulse oximeter and demonstrate accurate use of the pulse oximeter.;To learn and understand importance of maintaining oxygen saturations>88%;To learn and demonstrate proper pursed lip breathing techniques or other breathing techniques. ;To learn and demonstrate proper use of respiratory medications     Long  Term Goals -- Exhibits compliance with exercise, home  and travel O2 prescription;Verbalizes importance of monitoring SPO2 with pulse oximeter and return demonstration;Maintenance of O2 saturations>88%;Exhibits proper breathing techniques, such as pursed lip breathing or other method taught during program session;Compliance with respiratory medication;Demonstrates proper use of MDI's Exhibits compliance with exercise, home  and travel O2 prescription;Verbalizes importance of monitoring SPO2 with pulse oximeter and return demonstration;Maintenance of O2 saturations>88%;Exhibits proper breathing techniques, such as pursed lip breathing or other method taught during program session;Compliance with respiratory  medication;Demonstrates proper use of MDI's     Comments Reviewed PLB technique with pt.  Talked about how it works and it's importance in maintaining their exercise saturations. We reviewed PLB techique, and Sean [name] states that he has continued to practice PLB especially during exercise. He also states that he has been monitoring his oxygen saturations and reports that they have been dropping to 89-90% during exercise, but recovers well. Sean [name] is doing well in rehab other than consistent attendance.  His breathing is starting to get better and he is not using his oxygen as much at home.  Mostly only needs it when he is walking.  He has been able to lift weights and do stuff around house without as long as he takes rest breaks.  His saturations are staying in low 90s at home.  He was encouraged to conintue to use PLB to help with breathing and to use his oxygen for cardio.     Goals/Expected Outcomes Short: Become more profiecient at using PLB. Long: Become independent at using PLB. Short: Become more profiecient at using PLB. Long: Become independent at using PLB. Short: Use oxygen for cardio Long: conitnue to use PLB              Oxygen Discharge (Final Oxygen Re-Evaluation):  Oxygen Re-Evaluation - 08/12/22 1540       Program Oxygen Prescription   Program Oxygen Prescription Continuous;E-Tanks    Comments use 1-2L for exercise to avoid desaturations with exercise      Home Oxygen   Home Oxygen Device E-Tanks;Home Concentrator    Sleep Oxygen Prescription None    Home Exercise Oxygen Prescription None    Home Resting Oxygen Prescription None    Compliance with Home Oxygen Use Yes      Goals/Expected Outcomes   Short Term Goals To learn and exhibit compliance with exercise, home and travel O2 prescription;To learn and understand importance of monitoring SPO2 with pulse oximeter and demonstrate accurate use of the pulse oximeter.;To learn and understand importance of maintaining oxygen  saturations>88%;To learn and demonstrate proper pursed lip breathing techniques or other breathing techniques. ;To learn and demonstrate proper use of respiratory medications    Long  Term Goals Exhibits compliance with exercise, home  and travel O2 prescription;Verbalizes importance of monitoring SPO2 with pulse oximeter and return demonstration;Maintenance of O2 saturations>88%;Exhibits proper breathing techniques, such as pursed lip breathing or other method taught during program session;Compliance with respiratory medication;Demonstrates proper use of MDI's    Comments  Sean [name] is doing well in rehab other than consistent attendance.  His breathing is starting to get better and he is not using his oxygen as much at home.  Mostly only needs it when he is walking.  He has been able to lift weights and do stuff around house without as long as he takes rest breaks.  His saturations are staying in low 90s at home.  He was encouraged to conintue to use PLB to help with breathing and to use his oxygen for cardio.    Goals/Expected Outcomes Short: Use oxygen for cardio Long: conitnue to use PLB             Initial Exercise Prescription:  Initial Exercise Prescription - 06/02/22 1100       Date of Initial Exercise RX and Referring Provider   Date 06/02/22    Referring Provider Josph Macho MD      Oxygen   Oxygen Continuous    Liters 1-2    Maintain Oxygen Saturation 88% or higher      Treadmill   MPH 2.3    Grade 0.5    Minutes 15    METs 2.92      REL-XR   Level 2    Speed 50    Minutes 15    METs 2.5      T5 Nustep   Level 2    SPM 80    Minutes 15    METs 2.5      Track   Laps 34    Minutes 15    METs 2.85      Prescription Details   Frequency (times per week) 2    Duration Progress to 30 minutes of continuous aerobic without signs/symptoms of physical distress      Intensity   THRR 40-80% of Max Heartrate 132-161    Ratings of Perceived Exertion 11-13     Perceived Dyspnea 0-4      Progression   Progression Continue to progress workloads to maintain intensity without signs/symptoms of physical distress.      Resistance Training   Training Prescription Yes    Weight 5 lb    Reps 10-15             Perform Capillary Blood Glucose checks as needed.  Exercise Prescription Changes:   Exercise Prescription Changes     Row Name 06/02/22 1100 06/07/22 1000 06/21/22 1300 07/01/22 1000 07/05/22 1100     Response to Exercise   Blood Pressure (Admit) 104/58 118/62 124/80 -- 126/72   Blood Pressure (Exercise) 136/74 128/68 134/80 -- 128/68   Blood Pressure (Exit) 108/60 106/62 108/70 -- 110/66   Heart Rate (Admit) 102 bpm 98 bpm 97 bpm -- 98 bpm   Heart Rate (Exercise) 125 bpm 111 bpm 131 bpm -- 119 bpm   Heart Rate (Exit) 99 bpm 104 bpm 106 bpm -- 104 bpm   Oxygen Saturation (Admit) 93 % 94 % 92 % -- 95 %   Oxygen Saturation (Exercise) 79 % 89 % 89 % -- 90 %   Oxygen Saturation (Exit) 95 % 94 % 94 % -- 96 %   Rating of Perceived Exertion (Exercise) 13 13 14  -- 13   Perceived Dyspnea (Exercise) 3 1 2  -- 1   Symptoms SOB SOB SOB -- SOB   Comments walk test results 1st full day of exercise -- -- --   Duration -- Progress to 30 minutes of  aerobic without signs/symptoms of physical distress  Continue with 30 min of aerobic exercise without signs/symptoms of physical distress. -- Continue with 30 min of aerobic exercise without signs/symptoms of physical distress.   Intensity -- THRR unchanged THRR unchanged -- THRR unchanged     Progression   Progression -- Continue to progress workloads to maintain intensity without signs/symptoms of physical distress. Continue to progress workloads to maintain intensity without signs/symptoms of physical distress. -- Continue to progress workloads to maintain intensity without signs/symptoms of physical distress.   Average METs -- 2.85 2.51 -- 3.06     Resistance Training   Training Prescription -- Yes  Yes -- Yes   Weight -- 5 lb  bands 5 lb -- 5 lb   Reps -- 10-15 10-15 -- 10-15     Interval Training   Interval Training -- No No -- No     Oxygen   Oxygen -- Continuous Continuous -- Continuous   Liters -- 1-2 1-2 -- 2     Treadmill   MPH -- -- 2.3 -- 2.3   Grade -- -- 0.5 -- 0.5   Minutes -- -- 15 -- 15   METs -- -- 2.92 -- 2.92     Recumbant Elliptical   Level -- -- -- -- 3   Minutes -- -- -- -- 15   METs -- -- -- -- 2.4     REL-XR   Level -- 3 2 -- 2   Minutes -- 15 15 -- 15   METs -- 4 3.7 -- 3     T5 Nustep   Level -- -- 3 -- --   Minutes -- -- 15 -- --   METs -- -- 1.9 -- --     Track   Laps -- 13 30 -- --   Minutes -- 15 15 -- --   METs -- 1.71 2.63 -- --     Home Exercise Plan   Plans to continue exercise at -- -- -- Home (comment)  walking, weights, staff videos Home (comment)  walking, weights, staff videos   Frequency -- -- -- Add 3 additional days to program exercise sessions. Add 3 additional days to program exercise sessions.   Initial Home Exercises Provided -- -- -- 07/01/22 07/01/22     Oxygen   Maintain Oxygen Saturation -- 88% or higher 88% or higher -- 88% or higher    Row Name 07/19/22 1400 08/16/22 0900 08/30/22 1000 09/13/22 1400       Response to Exercise   Blood Pressure (Admit) 132/70 112/68 118/64 118/60    Blood Pressure (Exercise) 130/68 -- -- --    Blood Pressure (Exit) 118/70 126/70 104/60 122/60    Heart Rate (Admit) 104 bpm 95 bpm 81 bpm 95 bpm    Heart Rate (Exercise) 113 bpm 149 bpm 122 bpm 109 bpm    Heart Rate (Exit) 113 bpm 99 bpm 100 bpm 101 bpm    Oxygen Saturation (Admit) 90 % 95 % 96 % 93 %    Oxygen Saturation (Exercise) 88 % 88 % 88 % 90 %    Oxygen Saturation (Exit) 96 % 95 % 96 % 96 %    Rating of Perceived Exertion (Exercise) 11 11 13 11     Perceived Dyspnea (Exercise) -- -- 2 --    Symptoms SOB SOB SOB SOB    Duration Continue with 30 min of aerobic exercise without signs/symptoms of physical distress.  Continue with 30 min of aerobic exercise without signs/symptoms of physical distress. Continue with  30 min of aerobic exercise without signs/symptoms of physical distress. Continue with 30 min of aerobic exercise without signs/symptoms of physical distress.    Intensity THRR unchanged THRR unchanged THRR unchanged THRR unchanged      Progression   Progression Continue to progress workloads to maintain intensity without signs/symptoms of physical distress. Continue to progress workloads to maintain intensity without signs/symptoms of physical distress. Continue to progress workloads to maintain intensity without signs/symptoms of physical distress. Continue to progress workloads to maintain intensity without signs/symptoms of physical distress.    Average METs 3.61 3.56 4.03 2.97      Resistance Training   Training Prescription Yes Yes Yes Yes    Weight 5 lb 5 lb 7 lb 7 lb    Reps 10-15 10-15 10-15 10-15      Interval Training   Interval Training No No No No      Oxygen   Oxygen Continuous Continuous Continuous Continuous    Liters 2 2 2 2       Treadmill   MPH 2.1 2 3  2.5    Grade 0 0.5 0.5 1.5    Minutes 15 15 15 15     METs 2.61 2.67 3.5 3.43      Recumbant Elliptical   Level -- -- 3 3    Minutes -- -- 15 15    METs -- -- 3.9 2.5      REL-XR   Level 5 5 7  --    Minutes 15 15 15  --    METs 4.6 4.6 5.1 --      Home Exercise Plan   Plans to continue exercise at Home (comment)  walking, weights, staff videos Home (comment)  walking, weights, staff videos Home (comment)  walking, weights, staff videos Home (comment)  walking, weights, staff videos    Frequency Add 3 additional days to program exercise sessions. Add 3 additional days to program exercise sessions. Add 3 additional days to program exercise sessions. Add 3 additional days to program exercise sessions.    Initial Home Exercises Provided 07/01/22 07/01/22 07/01/22 07/01/22      Oxygen   Maintain Oxygen Saturation 88% or  higher 88% or higher 88% or higher 88% or higher             Exercise Comments:   Exercise Comments     Row Name 06/03/22 1122           Exercise Comments First full day of exercise!  Patient was oriented to gym and equipment including functions, settings, policies, and procedures.  Patient's individual exercise prescription and treatment plan were reviewed.  All starting workloads were established based on the results of the 6 minute walk test done at initial orientation visit.  The plan for exercise progression was also introduced and progression will be customized based on patient's performance and goals.                Exercise Goals and Review:   Exercise Goals     Row Name 06/02/22 1134             Exercise Goals   Increase Physical Activity Yes       Intervention Provide advice, education, support and counseling about physical activity/exercise needs.;Develop an individualized exercise prescription for aerobic and resistive training based on initial evaluation findings, risk stratification, comorbidities and participant's personal goals.       Expected Outcomes Short Term: Attend rehab on a regular basis to increase amount of physical activity.;Long  Term: Exercising regularly at least 3-5 days a week.;Long Term: Add in home exercise to make exercise part of routine and to increase amount of physical activity.       Increase Strength and Stamina Yes       Intervention Provide advice, education, support and counseling about physical activity/exercise needs.;Develop an individualized exercise prescription for aerobic and resistive training based on initial evaluation findings, risk stratification, comorbidities and participant's personal goals.       Expected Outcomes Short Term: Increase workloads from initial exercise prescription for resistance, speed, and METs.;Short Term: Perform resistance training exercises routinely during rehab and add in resistance training at  home;Long Term: Improve cardiorespiratory fitness, muscular endurance and strength as measured by increased METs and functional capacity ( )       Able to understand and use rate of perceived exertion (RPE) scale Yes       Intervention Provide education and explanation on how to use RPE scale       Expected Outcomes Short Term: Able to use RPE daily in rehab to express subjective intensity level;Long Term:  Able to use RPE to guide intensity level when exercising independently       Able to understand and use Dyspnea scale Yes       Intervention Provide education and explanation on how to use Dyspnea scale       Expected Outcomes Short Term: Able to use Dyspnea scale daily in rehab to express subjective sense of shortness of breath during exertion;Long Term: Able to use Dyspnea scale to guide intensity level when exercising independently       Knowledge and understanding of Target Heart Rate Range (THRR) Yes       Intervention Provide education and explanation of THRR including how the numbers were predicted and where they are located for reference       Expected Outcomes Short Term: Able to state/look up THRR;Short Term: Able to use daily as guideline for intensity in rehab;Long Term: Able to use THRR to govern intensity when exercising independently       Able to check pulse independently Yes       Intervention Provide education and demonstration on how to check pulse in carotid and radial arteries.;Review the importance of being able to check your own pulse for safety during independent exercise       Expected Outcomes Short Term: Able to explain why pulse checking is important during independent exercise;Long Term: Able to check pulse independently and accurately       Understanding of Exercise Prescription Yes       Intervention Provide education, explanation, and written materials on patient's individual exercise prescription       Expected Outcomes Short Term: Able to explain program  exercise prescription;Long Term: Able to explain home exercise prescription to exercise independently                Exercise Goals Re-Evaluation :  Exercise Goals Re-Evaluation     Row Name 06/03/22 1122 06/07/22 1040 06/17/22 0947 06/21/22 1342 06/29/22 1023     Exercise Goal Re-Evaluation   Exercise Goals Review Able to understand and use rate of perceived exertion (RPE) scale;Able to understand and use Dyspnea scale;Knowledge and understanding of Target Heart Rate Range (THRR);Understanding of Exercise Prescription Understanding of Exercise Prescription;Increase Physical Activity;Increase Strength and Stamina Understanding of Exercise Prescription;Increase Physical Activity;Increase Strength and Stamina Understanding of Exercise Prescription;Increase Physical Activity;Increase Strength and Stamina Increase Physical Activity   Comments Reviewed RPE scale, THR  and program prescription with pt today.  Pt voiced understanding and was given a copy of goals to take home. Ferlando did well his first day of rehab. He was able to exercise on level 3 on the XR and was able to walk the track around 15 laps. He uses his oxygen on as needed basis, and used 2L when walking. We will monitor to ensure his oxygen saturations stay above 88% at all times. We will continue to monitor as he progresses in the program. Sean [name] is doing well in rehab. He feels that his breathing and stamina have improved since he began the program. He has began to improve his workloads and laps on the track. He has been going to the the [facility] for resistance training with handweights. We talked about possibly starting to walk the track some at the [facility] as well. We will continue to monitor his progress in the program. Sean [name] is doing well in the program. He has done well with the XR at level 2 and improved to level 3 on the T5 nustep. He also walked up to 30 laps on the track and has continued to walk at a speed of 2.3 mph with a 0.5%  incline on the treadmill. He has still been rating his shortness of breath as a 2 on the dyspnea scale during exercise. We will continue to monitor his progress in the program. Sean [name] was encouraged to start walking down to rehab with his oxygen pushing wheelchair.   Expected Outcomes Short: Use RPE daily to regulate intensity. Long: Follow program prescription in THR. ShorT: Continue to follow initial exercise prescription Long: Build up overall strength and stamina Short: Begin walking at the [facility] along with resistance training. Long: Continue to improve strength and stamina Short: Progressively increase treadmill workload. Long: Continue to improve strength and stamina. Short: Start to walk to rehab Long: Continue to improve stamina    Row Name 07/01/22 1012 07/05/22 1105 07/19/22 1432 08/02/22 1254 08/12/22 1034     Exercise Goal Re-Evaluation   Exercise Goals Review Increase Physical Activity;Increase Strength and Stamina;Able to understand and use rate of perceived exertion (RPE) scale;Able to understand and use Dyspnea scale;Knowledge and understanding of Target Heart Rate Range (THRR);Able to check pulse independently;Understanding of Exercise Prescription Increase Physical Activity;Increase Strength and Stamina;Understanding of Exercise Prescription Increase Physical Activity;Increase Strength and Stamina;Understanding of Exercise Prescription Increase Physical Activity;Increase Strength and Stamina;Understanding of Exercise Prescription Increase Physical Activity;Increase Strength and Stamina;Understanding of Exercise Prescription   Comments Reviewed home exercise with pt today.  Pt plans to walk and use weights with his weight machine at home for exercise.  We also talked about using staff videos as an option as well.  Reviewed THR, pulse, RPE, sign and symptoms, pulse oximetry and when to call 911 or MD.  Also discussed weather considerations and indoor options.  Pt voiced understanding.  Sean [name] is doing well in rehab. He has stayed consistent at 2.3/1% workload on the treadmill and would benefit from increasing that. He tried out the REL for the first time and was able to work out at level 3. His oxygen saturations are staying above 88% and RPEs are staying appropriate. Will continue to monitor. Sean [name] has only attended rehab once since the last review. During his one session he walked the treadmill at 2.1 mph with no incline. He also improved from level 2 to level 5 on the XR. He has continued to use 5 lb hand weights for resistance training as well. We  will continue to monitor his progress in the program. Sean [name] has not attended rehab since 3/26, he had a procedure and has several appts leading up to that. Once patient is cleared to come back, we will encourage to maintain consistent attendance. Sean [name] is doing well in rehab.  He has only had a handful of session since last review due to not feeling well and other appointments.  We talked about making sure he works to get into rehab to see a better improvement.  We talked about importance of getting cardio in to train oxygen mobilization.  He is doing weights at home to build muscle.  We also talked about the importance of getting to rehab as he builds towards transplant.   Expected Outcomes Short: Start to add in more walking at home Long: Continue to exercise independently Short: Increase speed on treadmill Long: Continue to increase overall MET level and stamina Short: Return to regular attendance in the program. Long: Continue to improve strength and stamina. Short: Return to rehab when cleared Long: Graduate from [employer] Short: Regular attend at rehab Long: Add in more cardio at home.    Row Name 08/16/22 0935 08/30/22 1046 09/13/22 1411 09/29/22 1422 10/11/22 0847     Exercise Goal Re-Evaluation   Exercise Goals Review Increase Physical Activity;Increase Strength and Stamina;Understanding of Exercise Prescription Increase  Physical Activity;Increase Strength and Stamina;Understanding of Exercise Prescription Increase Physical Activity;Increase Strength and Stamina;Understanding of Exercise Prescription Increase Physical Activity;Increase Strength and Stamina;Understanding of Exercise Prescription --   Comments Sean [name] returned for two sessions after not attending rehab since 07/06/2022. During his two sessions he was able to work on the treadmill at a speed of 2 mph and 0.5% incline. He also worked at level 5 on the XR and continued to use 5 lb hand weights for resistance training. We will continue to monitor his progress in the program. Sean [name] continues to do well in rehab. He increased his treadmill to a 3.0 speed and also increased to level 7 on the XR, working over 5 METS! He is now using 7 lb handweights as well. We will continue to monitor. Sean [name] has only attended rehab once since the last review. He was able to increase his incline on the treadmill to 1.5% while maintaining his speed at 2.5 mph. He also continued to work at level 3 on the recumbent elliptical. He consistently used 7 lb hand weights for resistance training as well. We will continue to monitor his progress in the program. Sean [name] has not attended rehab since the last review. We will reach out to patient to see when he plans to return to rehab. We will continue to monitor his progress when he returns to rehab. Sean [name] has not attended rehab since 09/02/2022. We called to check in on patient who reported he would return to rehab on 07/01. We will continue to monitor his progress when he returns to rehab.   Expected Outcomes Short: Attend rehab regularly. Long: Continue to improve strength and stamina. Short: Continue to slowly increase incline on treadmill Long: Continue to increase overall MET level and stamina Short: Return to regular, consistent attendance in the program. Long: Continue to increase strength and stamina. Short: Return to regular,  consistent attendance in the program. Long: Continue to increase strength and stamina. Short: Return to regular, consistent attendance in the program. Long: Continue to increase strength and stamina.            Discharge Exercise Prescription (Final Exercise Prescription Changes):  Exercise  Prescription Changes - 09/13/22 1400       Response to Exercise   Blood Pressure (Admit) 118/60    Blood Pressure (Exit) 122/60    Heart Rate (Admit) 95 bpm    Heart Rate (Exercise) 109 bpm    Heart Rate (Exit) 101 bpm    Oxygen Saturation (Admit) 93 %    Oxygen Saturation (Exercise) 90 %    Oxygen Saturation (Exit) 96 %    Rating of Perceived Exertion (Exercise) 11    Symptoms SOB    Duration Continue with 30 min of aerobic exercise without signs/symptoms of physical distress.    Intensity THRR unchanged      Progression   Progression Continue to progress workloads to maintain intensity without signs/symptoms of physical distress.    Average METs 2.97      Resistance Training   Training Prescription Yes    Weight 7 lb    Reps 10-15      Interval Training   Interval Training No      Oxygen   Oxygen Continuous    Liters 2      Treadmill   MPH 2.5    Grade 1.5    Minutes 15    METs 3.43      Recumbant Elliptical   Level 3    Minutes 15    METs 2.5      Home Exercise Plan   Plans to continue exercise at Home (comment)   walking, weights, staff videos   Frequency Add 3 additional days to program exercise sessions.    Initial Home Exercises Provided 07/01/22      Oxygen   Maintain Oxygen Saturation 88% or higher             Nutrition:  Target Goals: Understanding of nutrition guidelines, daily intake of sodium 1500mg , cholesterol 200mg , calories 30% from fat and 7% or less from saturated fats, daily to have 5 or more servings of fruits and vegetables.  Education: All About Nutrition: -Group instruction provided by verbal, written material, interactive activities,  discussions, models, and posters to present general guidelines for heart healthy nutrition including fat, fiber, MyPlate, the role of sodium in heart healthy nutrition, utilization of the nutrition label, and utilization of this knowledge for meal planning. Follow up email sent as well. Written material given at graduation.   Biometrics:  Pre Biometrics - 06/02/22 1134       Pre Biometrics   Height 6' 5.5" (1.969 m)    Weight 153 lb 8 oz (69.6 kg)    Waist Circumference 28 inches    Hip Circumference 32 inches    Waist to Hip Ratio 0.88 %    BMI (Calculated) 17.96    Single Leg Stand 30 seconds              Nutrition Therapy Plan and Nutrition Goals:  Nutrition Therapy & Goals - 06/28/22 1333       Nutrition Therapy   Diet Heart healthy, low Na, high kal/high protein    Protein (specify units) 125g    Fiber 35 grams    Whole Grain Foods 3 servings    Saturated Fats 16 max. grams    Fruits and Vegetables 8 servings/day    Sodium 2 grams      Personal Nutrition Goals   Nutrition Goal ST: build meals with heart healthy fats, fiber, and protein. Cut down on salt used after cooking. Review paperwork and Increase involvement in meal planning/prepping.  LT:  follow MyPlate guidelines, gain weight and muscle mass, limit Na <2g/day    Comments 47 y.o. M admitted to pulmonary rehab for ILD. 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 Reviewed relevant medications: humira,furosemide (pt reports no longer prescribed) , GI Cocktail (RULOX,LIDOCAINE), hydroxychloroquine, mycophenolate, pantoprazole, prednisone, breztri, vit D2 . B: grilled cheese sandwich or oatmeal L: big meal- chicken sub or philly cheese steak sub S:granola bar before gym and protein shake after gym D: pizza or meat or chicken with vegetables. Sean [name] reports not cooking or shopping and  that usually his girlfriend or mom will cook for him. His mom reports that they bake or grill and use olive oil, she uses mrs dash, but does not add salt while cooking. Sean [name] reports using salt afterwards, but he is not sure how much he adds. He reports going out to eat 3-4x week. Drinks: sweet tea or fruit punch or sometimes soda. He reports that he has never made changes to his diet before. He reports that his weight has been stable - he lost weight (~60lbs over the course of 4-5 months) when he first started getting sick - he feels the medication dulled his appetite and he reports his appetetite has improved. Jabree reports using serious mass protein, but onlya half a scoop - this provides 630 calories and 25g of protein; discussed how this also contains many vitamins and minerals and cautioned against exceeding recommended doses. Encouraged to make sure any supplements are third party tested. Discussed general healthy eating and pulmonary MNT. Discussed high calorie/high protein diet and encouraged eating foods higher in fat to increase calories without increased volume - discussed how most of these fats should ideally come from healthy/nutritionally dense sources; peanut butter, liquid plant oils, avocado, and whole fat greek yogurt. Discussed that he could have another scoop of protein powder (2 scoops total/day) for another snack or sip on it in between meals. Encouraged him to become more involved in the cooking and meal planning/shopping process.      Intervention Plan   Intervention Prescribe, educate and counsel regarding individualized specific dietary modifications aiming towards targeted core components such as weight, hypertension, lipid management, diabetes, heart failure and other comorbidities.    Expected Outcomes Short Term Goal: Understand basic principles of dietary content, such as calories, fat, sodium, cholesterol and nutrients.;Short Term Goal: A plan has been developed with  personal nutrition goals set during dietitian appointment.;Long Term Goal: Adherence to prescribed nutrition plan.             Nutrition Assessments:  MEDIFICTS Score Key: ?70 Need to make dietary changes  40-70 Heart Healthy Diet ? 40 Therapeutic Level Cholesterol Diet  Flowsheet Row Pulmonary Rehab from 06/08/2022 in [facility]  Picture Your Plate Total Score on Admission 52      Picture Your Plate Scores: <29 Unhealthy dietary pattern with much room for improvement. 41-50 Dietary pattern unlikely to meet recommendations for good health and room for improvement. 51-60 More healthful dietary pattern, with some room for improvement.  >60 Healthy dietary pattern, although there may be some specific behaviors that could be improved.   Nutrition Goals Re-Evaluation:  Nutrition Goals Re-Evaluation     Row Name 06/17/22 1003 08/12/22 1047           Goals   Nutrition Goal -- ST: build meals with heart healthy fats, fiber, and protein. Cut down on salt used  after cooking. Review paperwork and Increase involvement in meal planning/prepping.  LT: follow MyPlate guidelines, gain weight and muscle mass, limit Na <2g/day      Comment Sean [name] is looking forward to meeting with the RD and has a meeting scheduled for next week. He is looking forward to talking to the RD about healthy eating patterns to help him gain weight. Sean [name] is working on his diet.  He has been able to increase some of his protein intake to build muscle.  He is not adding as much salt as he did before.  He continues to work on meal planning.      Expected Outcome Short: Meet with RD. Long: Implement dietary patterns discussed with RD. Short: Continue to increase protein Long: Conitnue to improve diet               Nutrition Goals Discharge (Final Nutrition Goals Re-Evaluation):  Nutrition Goals Re-Evaluation - 08/12/22 1047       Goals   Nutrition Goal ST: build meals with heart  healthy fats, fiber, and protein. Cut down on salt used after cooking. Review paperwork and Increase involvement in meal planning/prepping.  LT: follow MyPlate guidelines, gain weight and muscle mass, limit Na <2g/day    Comment Sean [name] is working on his diet.  He has been able to increase some of his protein intake to build muscle.  He is not adding as much salt as he did before.  He continues to work on meal planning.    Expected Outcome Short: Continue to increase protein Long: Conitnue to improve diet             Psychosocial: Target Goals: Acknowledge presence or absence of significant depression and/or stress, maximize coping skills, provide positive support system. Participant is able to verbalize types and ability to use techniques and skills needed for reducing stress and depression.   Education: Stress, Anxiety, and Depression - Group verbal and visual presentation to define topics covered.  Reviews how body is impacted by stress, anxiety, and depression.  Also discusses healthy ways to reduce stress and to treat/manage anxiety and depression.  Written material given at graduation. Flowsheet Row Pulmonary Rehab from 09/02/2022 in [facility]  Date 09/02/22  Educator [facility]  Instruction Review Code 1- [employer] Understanding       Education: Sleep Hygiene -Provides group verbal and written instruction about how sleep can affect your health.  Define sleep hygiene, discuss sleep cycles and impact of sleep habits. Review good sleep hygiene tips.    Initial Review & Psychosocial Screening:  Initial Psych Review & Screening - 05/25/22 1308       Initial Review   Current issues with Current Stress Concerns    Source of Stress Concerns Unable to perform yard/household activities;Unable to participate in former interests or hobbies      Family Dynamics   Good Support System? Yes   mom     Barriers   Psychosocial barriers to participate in program There are no  identifiable barriers or psychosocial needs.;The patient should benefit from training in stress management and relaxation.      Screening Interventions   Interventions Encouraged to exercise;Provide feedback about the scores to participant;To provide support and resources with identified psychosocial needs    Expected Outcomes Long Term Goal: Stressors or current issues are controlled or eliminated.;Short Term goal: Utilizing psychosocial counselor, staff and physician to assist with identification of specific Stressors or current issues interfering with healing process. Setting desired  goal for each stressor or current issue identified.;Short Term goal: Identification and review with participant of any Quality of Life or Depression concerns found by scoring the questionnaire.;Long Term goal: The participant improves quality of Life and PHQ9 Scores as seen by post scores and/or verbalization of changes             Quality of Life Scores:  Scores of 19 and below usually indicate a poorer quality of life in these areas.  A difference of  2-3 points is a clinically meaningful difference.  A difference of 2-3 points in the total score of the Quality of Life Index has been associated with significant improvement in overall quality of life, self-image, physical symptoms, and general health in studies assessing change in quality of life.  PHQ-9: Review Flowsheet       06/29/2022 06/02/2022 10/22/2021  Depression screen PHQ 2/9  Decreased Interest 1 2 0  Down, Depressed, Hopeless 0 1 0  PHQ - 2 Score 1 3 0  Altered sleeping 1 1 -  Tired, decreased energy 1 2 -  Change in appetite 0 1 -  Feeling bad or failure about yourself  0 1 -  Trouble concentrating 0 0 -  Moving slowly or fidgety/restless 0 1 -  Suicidal thoughts 0 0 -  PHQ-9 Score 3 9 -  Difficult doing work/chores Somewhat difficult Very difficult -   Interpretation of Total Score  Total Score Depression Severity:  1-4 = Minimal  depression, 5-9 = Mild depression, 10-14 = Moderate depression, 15-19 = Moderately severe depression, 20-27 = Severe depression   Psychosocial Evaluation and Intervention:  Psychosocial Evaluation - 05/25/22 1318       Psychosocial Evaluation & Interventions   Interventions Encouraged to exercise with the program and follow exercise prescription    Comments Sean [name] is coming to pulmonary rehab with worsening ILD. He reports that since he started getting sick a few years ago, he hasn't really felt like doing much. He finds himself lying around more and getting more winded when he does get up to do things. He used to work in a [employer] and enjoy exercising. He wants to get back some of the strength he has lost. He knows it will be a journey getting back to a more active lifestyle and is wary of his breathing's reaction, but he wants to put in the work. He enjoys playing video games for fun and has a good support system.    Expected Outcomes Short: attend pulmonary rehab for education and exercise. Long: develop and maintain positive self care habits.    Continue Psychosocial Services  Follow up required by staff             Psychosocial Re-Evaluation:  Psychosocial Re-Evaluation     Row Name 06/17/22 2130 06/29/22 1010 08/12/22 1042         Psychosocial Re-Evaluation   Current issues with Current Sleep Concerns;Current Psychotropic Meds Current Psychotropic Meds Current Psychotropic Meds     Comments Sean [name] denies any current stressors at this time. He reports that his sleep has improved since starting some of his medications, however he does want to become too dependent on the medication to help him sleep. He states that exercise has been a good avenue of stress relief for him. He also reports having a good support system made up by his parents, kids, and girlfriend. Sean [name] PHQ score decreased from a 9 to a 3! Continues ti stay compliant with medications. Exercise is helping  him  take his mind off of things. Will continue to monitor. Sean [name] is doing well mentally.  His biggest stressor is his health.  He is driving some and was encouraged to drive himself to rehab.  He is sleeping well.  He is still getting SOB whihch limits what he can do.     Expected Outcomes Short: Continue to exercise for mental boost. Long: Maintain positive outlook. Short: Continue exercise for mental health Long: Continue to maintain positive attitude Short; Continue to exercise more to help with breathing Long: Conitnue to stay positiv e     Interventions Encouraged to attend Pulmonary Rehabilitation for the exercise Encouraged to attend Pulmonary Rehabilitation for the exercise Encouraged to attend Pulmonary Rehabilitation for the exercise     Continue Psychosocial Services  Follow up required by staff Follow up required by staff Follow up required by staff              Psychosocial Discharge (Final Psychosocial Re-Evaluation):  Psychosocial Re-Evaluation - 08/12/22 1042       Psychosocial Re-Evaluation   Current issues with Current Psychotropic Meds    Comments Sean [name] is doing well mentally.  His biggest stressor is his health.  He is driving some and was encouraged to drive himself to rehab.  He is sleeping well.  He is still getting SOB whihch limits what he can do.    Expected Outcomes Short; Continue to exercise more to help with breathing Long: Conitnue to stay positiv e    Interventions Encouraged to attend Pulmonary Rehabilitation for the exercise    Continue Psychosocial Services  Follow up required by staff             Education: Education Goals: Education classes will be provided on a weekly basis, covering required topics. Participant will state understanding/return demonstration of topics presented.  Learning Barriers/Preferences:   General Pulmonary Education Topics:  Infection Prevention: - Provides verbal and written material to individual with discussion of  infection control including proper hand washing and proper equipment cleaning during exercise session. Flowsheet Row Pulmonary Rehab from 09/02/2022 in [facility] Cardiac and Pulmonary Rehab  Date 06/02/22  Educator [facility]  Instruction Review Code 1- Verbalizes Understanding       Falls Prevention: - Provides verbal and written material to individual with discussion of falls prevention and safety. Flowsheet Row Pulmonary Rehab from 09/02/2022 in [facility] Cardiac and Pulmonary Rehab  Date 06/02/22  Educator [facility]  Instruction Review Code 1- Verbalizes Understanding       Chronic Lung Disease Review: - Group verbal instruction with posters, models, PowerPoint presentations and videos,  to review new updates, new respiratory medications, new advancements in procedures and treatments. Providing information on websites and "800" numbers for continued self-education. Includes information about supplement oxygen, available portable oxygen systems, continuous and intermittent flow rates, oxygen safety, concentrators, and Medicare reimbursement for oxygen. Explanation of Pulmonary Drugs, including class, frequency, complications, importance of spacers, rinsing mouth after steroid MDI's, and proper cleaning methods for nebulizers. Review of basic lung anatomy and physiology related to function, structure, and complications of lung disease. Review of risk factors. Discussion about methods for diagnosing sleep apnea and types of masks and machines for OSA. Includes a review of the use of types of environmental controls: home humidity, furnaces, filters, dust mite/pet prevention, HEPA vacuums. Discussion about weather changes, air quality and the benefits of nasal washing. Instruction on Warning signs, infection symptoms, calling MD promptly, preventive modes, and value of vaccinations. Review of effective airway  clearance, coughing and/or vibration techniques. Emphasizing that all should Create an Action Plan. Written material  given at graduation. Flowsheet Row Pulmonary Rehab from 09/02/2022 in [facility] Cardiac and Pulmonary Rehab  Date 06/17/22  Educator [facility]  Instruction Review Code 1- Verbalizes Understanding       AED/CPR: - Group verbal and written instruction with the use of models to demonstrate the basic use of the AED with the basic ABC's of resuscitation.    Anatomy and Cardiac Procedures: - Group verbal and visual presentation and models provide information about basic cardiac anatomy and function. Reviews the testing methods done to diagnose heart disease and the outcomes of the test results. Describes the treatment choices: Medical Management, Angioplasty, or Coronary Bypass Surgery for treating various heart conditions including Myocardial Infarction, Angina, Valve Disease, and Cardiac Arrhythmias.  Written material given at graduation.   Medication Safety: - Group verbal and visual instruction to review commonly prescribed medications for heart and lung disease. Reviews the medication, class of the drug, and side effects. Includes the steps to properly store meds and maintain the prescription regimen.  Written material given at graduation.   Other: -Provides group and verbal instruction on various topics (see comments)   Knowledge Questionnaire Score:  Knowledge Questionnaire Score - 06/08/22 0939       Knowledge Questionnaire Score   Pre Score 13/16              Core Components/Risk Factors/Patient Goals at Admission:  Personal Goals and Risk Factors at Admission - 06/02/22 1135       Core Components/Risk Factors/Patient Goals on Admission    Weight Management Yes;Weight Gain    Intervention Weight Management: Develop a combined nutrition and exercise program designed to reach desired caloric intake, while maintaining appropriate intake of nutrient and fiber, sodium and fats, and appropriate energy expenditure required for the weight goal.;Weight Management: Provide education and  appropriate resources to help participant work on and attain dietary goals.    Admit Weight 153 lb 8 oz (69.6 kg)    Goal Weight: Short Term 160 lb (72.6 kg)    Goal Weight: Long Term 165 lb (74.8 kg)    Expected Outcomes Long Term: Adherence to nutrition and physical activity/exercise program aimed toward attainment of established weight goal;Short Term: Continue to assess and modify interventions until short term weight is achieved;Weight Gain: Understanding of general recommendations for a high calorie, high protein meal plan that promotes weight gain by distributing calorie intake throughout the day with the consumption for 4-5 meals, snacks, and/or supplements;Weight Maintenance: Understanding of the daily nutrition guidelines, which includes 25-35% calories from fat, 7% or less cal from saturated fats, less than 200mg  cholesterol, less than 1.5gm of sodium, & 5 or more servings of fruits and vegetables daily    Improve shortness of breath with ADL's Yes    Intervention Provide education, individualized exercise plan and daily activity instruction to help decrease symptoms of SOB with activities of daily living.    Expected Outcomes Short Term: Improve cardiorespiratory fitness to achieve a reduction of symptoms when performing ADLs;Long Term: Be able to perform more ADLs without symptoms or delay the onset of symptoms    Increase knowledge of respiratory medications and ability to use respiratory devices properly  Yes    Intervention Provide education and demonstration as needed of appropriate use of medications, inhalers, and oxygen therapy.    Expected Outcomes Short Term: Achieves understanding of medications use. Understands that oxygen is a medication prescribed by  physician. Demonstrates appropriate use of inhaler and oxygen therapy.;Long Term: Maintain appropriate use of medications, inhalers, and oxygen therapy.    Diabetes Yes    Intervention Provide education about signs/symptoms and  action to take for hypo/hyperglycemia.;Provide education about proper nutrition, including hydration, and aerobic/resistive exercise prescription along with prescribed medications to achieve blood glucose in normal ranges: Fasting glucose 65-99 mg/dL    Expected Outcomes Short Term: Participant verbalizes understanding of the signs/symptoms and immediate care of hyper/hypoglycemia, proper foot care and importance of medication, aerobic/resistive exercise and nutrition plan for blood glucose control.;Long Term: Attainment of HbA1C < 7%.    Hypertension Yes    Intervention Provide education on lifestyle modifcations including regular physical activity/exercise, weight management, moderate sodium restriction and increased consumption of fresh fruit, vegetables, and low fat dairy, alcohol moderation, and smoking cessation.;Monitor prescription use compliance.    Expected Outcomes Short Term: Continued assessment and intervention until BP is < 140/36mm HG in hypertensive participants. < 130/78mm HG in hypertensive participants with diabetes, heart failure or chronic kidney disease.;Long Term: Maintenance of blood pressure at goal levels.             Education:Diabetes - Individual verbal and written instruction to review signs/symptoms of diabetes, desired ranges of glucose level fasting, after meals and with exercise. Acknowledge that pre and post exercise glucose checks will be done for 3 sessions at entry of program. Flowsheet Row Pulmonary Rehab from 09/02/2022 in [facility] Cardiac and Pulmonary Rehab  Date 05/25/22  Educator [facility]  Instruction Review Code 1- Verbalizes Understanding       Know Your Numbers and Heart Failure: - Group verbal and visual instruction to discuss disease risk factors for cardiac and pulmonary disease and treatment options.  Reviews associated critical values for Overweight/Obesity, Hypertension, Cholesterol, and Diabetes.  Discusses basics of heart failure: signs/symptoms and  treatments.  Introduces Heart Failure Zone chart for action plan for heart failure.  Written material given at graduation. Flowsheet Row Pulmonary Rehab from 09/02/2022 in [facility] Cardiac and Pulmonary Rehab  Date 06/10/22  Educator [facility]  Instruction Review Code 1- Verbalizes Understanding       Core Components/Risk Factors/Patient Goals Review:   Goals and Risk Factor Review     Row Name 06/17/22 [phone]             Core Components/Risk Factors/Patient Goals Review   Personal Goals Review Weight Management/Obesity;Diabetes;Hypertension       Review Sean [name] states that he wants to gain some weight and has begun to eat more to meet his weight goal. He would like to gain about 20 lbs with a weight goal of 180 lb. He has also been checking his blood sugars and reports that they are within normal ranges. He does not own a BP cuff so he has not been checking his blood pressures at home. His BP readings have been within normal ranges while at rehab.       Expected Outcomes Short: Continue to monitor blood sugars. Long: Continue to work towards weight goal.                Core Components/Risk Factors/Patient Goals at Discharge (Final Review):   Goals and Risk Factor Review - 06/17/22 0956       Core Components/Risk Factors/Patient Goals Review   Personal Goals Review Weight Management/Obesity;Diabetes;Hypertension    Review Darron states that he wants to gain some weight and has begun to eat more to meet his weight goal. He would like to gain about  20 lbs with a weight goal of 180 lb. He has also been checking his blood sugars and reports that they are within normal ranges. He does not own a BP cuff so he has not been checking his blood pressures at home. His BP readings have been within normal ranges while at rehab.    Expected Outcomes Short: Continue to monitor blood sugars. Long: Continue to work towards weight goal.             ITP Comments:  ITP Comments     Row Name 05/25/22  1323 06/02/22 1119 06/03/22 1122 06/28/22 1333 06/30/22 0815   ITP Comments Initial phone call completed. Diagnosis can be found in [facility] 2/5. EP Orientation scheduled for Wednesday, 2/21 at 2:30. Completed and gym orientation. Initial ITP created and sent for review to Dr. Jinny Sanders, Medical Director.  He will return his paperwork on his first day. First full day of exercise!  Patient was oriented to gym and equipment including functions, settings, policies, and procedures.  Patient's individual exercise prescription and treatment plan were reviewed.  All starting workloads were established based on the results of the 6 minute walk test done at initial orientation visit.  The plan for exercise progression was also introduced and progression will be customized based on patient's performance and goals. Completed Initial RD Consultation 30 Day review completed. Medical Director ITP review done, changes made as directed, and signed approval by Medical Director.    Row Name 07/27/22 [phone] 07/28/22 0841 08/25/22 0823 09/16/22 1412 09/22/22 0830   ITP Comments Patient's mother called to let us know that Kemper is having a procedure done tomorrow and has prep today.  He will also be out again on Thursday.  He has not attended since 07/06/22 for various reasons.  We have not been able to assess for goals this round. 30 day review completed. ITP sent to Dr. Jinny Sanders, Medical Director of  Pulmonary Rehab. Continue with ITP unless changes are made by physician. 30 Day review completed. Medical Director ITP review done, changes made as directed, and signed approval by Medical Director. Pt's mom called this morning and requested for him to come this afternoon.  Pt did not show for appt, called to check on him, he had just woken up thinking appt was tomorrow.  He did reschedule class for afternoon starting on Monday.  If he does not attend Monday, we will not be able to assess his goals.  We also discussed  needing to improve his attendance in order to retain his spot in rehab. 30 Day review completed. Medical Director ITP review done, changes made as directed, and signed approval by Medical Director.    Row Name 10/07/22 1516 10/13/22 1603 10/19/22 1408       ITP Comments Called and spoke with pt. He was discharge from hospital 6/21 for pleural effusion and tachycardia. Per discharge note pt clear to restart pulmonary rehab. Pt states he is feeling better and will plan to attend his next scheduled session in rehab Monday 7/1. Pt no show for pulmonary rehab 6/27, 7/1 and 7/3. Letter mailed to pt. Discharge date set for 10/29/22. Pt made aware. 30 Day review completed. Medical Director ITP review done, changes made as directed, and signed approval by Medical Director.   out since5/23              Comments:

## 2022-10-20 ENCOUNTER — Ambulatory Visit: Payer: Medicaid Other

## 2022-10-20 ENCOUNTER — Encounter: Payer: Self-pay | Admitting: *Deleted

## 2022-10-20 ENCOUNTER — Telehealth: Payer: Self-pay | Admitting: *Deleted

## 2022-10-20 DIAGNOSIS — J849 Interstitial pulmonary disease, unspecified: Secondary | ICD-10-CM

## 2022-10-20 NOTE — Progress Notes (Signed)
Discharge Summary: Sean [name] (DOB: 1975/10/12)  Pt was unable to complete pulmonary rehab at this time. Pt completed 17 out of 36 sessions. Pt discharged at this time.    6 Minute Walk     Row Name 06/02/22 1125         6 Minute Walk   Phase Initial     Distance 1261 feet     Walk Time 6 minutes     # of Rest Breaks 0     MPH 2.39     METS 5.42     RPE 13     Perceived Dyspnea  3     VO2 Peak 18.99     Symptoms Yes (comment)     Comments SOB     Resting HR 102 bpm     Resting BP 104/58     Resting Oxygen Saturation  87 %     Exercise Oxygen Saturation  during 6 min walk 79 %     Max Ex. HR 125 bpm     Max Ex. BP 136/74     2 Minute Post BP 122/66       Interval HR   1 Minute HR 113     2 Minute HR 117     3 Minute HR 122     4 Minute HR 125     5 Minute HR 125     6 Minute HR 125     2 Minute Post HR 107     Interval Heart Rate? Yes       Interval Oxygen   Interval Oxygen? Yes     Baseline Oxygen Saturation % 93 %     1 Minute Oxygen Saturation % 87 %     1 Minute Liters of Oxygen 0 L  Room Air     2 Minute Oxygen Saturation % 86 %     2 Minute Liters of Oxygen 0 L     3 Minute Oxygen Saturation % 81 %     3 Minute Liters of Oxygen 0 L     4 Minute Oxygen Saturation % 80 %     4 Minute Liters of Oxygen 0 L     5 Minute Oxygen Saturation % 80 %     5 Minute Liters of Oxygen 0 L     6 Minute Oxygen Saturation % 81 %  79% at 5:30     6 Minute Liters of Oxygen 0 L     2 Minute Post Oxygen Saturation % 90 %     2 Minute Post Liters of Oxygen 0 L

## 2022-10-20 NOTE — Progress Notes (Signed)
Pulmonary Individual Treatment Plan  Patient Details  Name: Sean [name] MRN: [id number] Date of Birth: Jul 20, 1975 Referring Provider:   Flowsheet Row Pulmonary Rehab from 06/02/2022 in [facility] Cardiac and Pulmonary Rehab  Referring Provider Josph Macho MD       Initial Encounter Date:  Flowsheet Row Pulmonary Rehab from 06/02/2022 in [facility] Cardiac and Pulmonary Rehab  Date 06/02/22       Visit Diagnosis: ILD (interstitial lung disease) (HCC)  Patient's Home Medications on Admission:  Current Outpatient Medications:    acetaminophen (TYLENOL) 500 MG tablet, Take by mouth., Disp: , Rfl:    albuterol (VENTOLIN HFA) 108 (90 Base) MCG/ACT inhaler, Inhale 2 puffs into the lungs every 6 (six) hours as needed for wheezing or shortness of breath., Disp: 8 g, Rfl: 2   blood glucose meter kit and supplies KIT, Dispense based on patient and insurance preference. Use up to four times daily as directed., Disp: 1 each, Rfl: 0   blood glucose meter kit and supplies, Dispense based on patient and insurance preference. Use up to four times daily as directed. (FOR ICD-10 E10.9, E11.9)., Disp: 1 each, Rfl: 0   Blood Glucose Monitoring Suppl (CONTOUR NEXT ONE) KIT, 4 (four) times daily., Disp: , Rfl:    BREZTRI AEROSPHERE 160-9-4.8 MCG/ACT AERO, Inhale 2 puffs into the lungs 2 (two) times daily., Disp: 10.7 g, Rfl: 0   chlorhexidine (PERIDEX) 0.12 % solution, 15 mLs 2 (two) times daily. (Patient not taking: Reported on 05/25/2022), Disp: , Rfl:    Cholecalciferol (VITAMIN D-1000 MAX ST) 25 MCG (1000 UT) tablet, Take by mouth., Disp: , Rfl:    clobetasol ointment (TEMOVATE) 0.05 %, Apply topically., Disp: , Rfl:    furosemide (LASIX) 20 MG tablet, Take 1 tablet (20 mg total) by mouth daily., Disp: 30 tablet, Rfl: 0   hydroxychloroquine (PLAQUENIL) 200 MG tablet, Place 1 tablet (200 mg total) into feeding tube 2 (two) times daily. (Patient not taking: Reported on 05/25/2022), Disp: , Rfl:    insulin aspart  (NOVOLOG) 100 UNIT/ML FlexPen, Inject 1-9 Units into the skin every 6 (six) hours. CBG 70 - 120: 0 units CBG 121 - 150: 1 unit CBG 151 - 200: 2 units CBG 201 - 250: 3 units CBG 251 - 300: 5 units CBG 301 - 350: 7 units CBG 351 - 400: 9 units CBG > 400: call MD and obtain STAT lab verification, Disp: 15 mL, Rfl: 11   Insulin Pen Needle (PEN NEEDLES 3/16") 31G X 5 MM MISC, 1 pen. by Does not apply route every 6 (six) hours., Disp: 200 each, Rfl: 1   lidocaine (XYLOCAINE) 2 % solution, Use as directed 15 mLs in the mouth or throat every 6 (six) hours as needed (mouth/throat pain). (Patient not taking: Reported on 05/25/2022), Disp: 100 mL, Rfl: 0   meloxicam (MOBIC) 15 MG tablet, Take by mouth., Disp: , Rfl:    Multiple Vitamins-Minerals (SUPER THERA VITE M) TABS, Take 1 tablet by mouth daily., Disp: , Rfl:    NONFORMULARY OR COMPOUNDED ITEM, LIDOCAINE/MAALOX XS 1:1 (Patient not taking: Reported on 05/25/2022), Disp: , Rfl:    pantoprazole (PROTONIX) 40 MG tablet, Take 1 tablet (40 mg total) by mouth daily. (Patient not taking: Reported on 05/25/2022), Disp: 30 tablet, Rfl: 0   tacrolimus (PROTOPIC) 0.1 % ointment, Apply topically 2 (two) times daily., Disp: , Rfl:    thiamine (VITAMIN B1) 100 MG tablet, Take by mouth., Disp: , Rfl:    triamcinolone cream (KENALOG)  0.1 %, Apply topically 2 (two) times daily., Disp: , Rfl:    Vitamin D 12.5 MCG/0.25ML LIQD, Give 1 mL by tube daily. (Patient not taking: Reported on 05/25/2022), Disp: 36 mL, Rfl: 1  Past Medical History: Past Medical History:  Diagnosis Date   H/O immunosuppressive therapy    Hyperpigmentation of skin    Hyperpigmentation rash/vesicles of the palm and face   Neutropenia (HCC)    RA (rheumatoid arthritis) (HCC)     Tobacco Use: Social History   Tobacco Use  Smoking Status Former   Types: Cigarettes  Smokeless Tobacco Never    Labs: Review Flowsheet  More data may exist      Latest Ref Rng & Units 07/26/2021 09/10/2021 01/12/2022  06/03/2022 08/12/2022  Labs for ITP Cardiac and Pulmonary Rehab  Cholestrol 0 - 200 mg/dL - - 147  829  562   LDL (calc) 0 - 99 mg/dL - - 91  64  78   HDL-C >40 mg/dL - - 38  35  37   Trlycerides <150 mg/dL 130  - 865  85  784   Hemoglobin A1c 4.8 - 5.6 % - 11.9  6.4  5.5  5.4      Pulmonary Assessment Scores:  Pulmonary Assessment Scores     Row Name 06/02/22 1137 06/08/22 0938       ADL UCSD   ADL Phase Exit Entry    SOB Score total -- 75    Rest -- 0    Walk -- 2    Stairs -- 5    Bath -- 3    Dress -- 3    Shop -- 4      CAT Score   CAT Score -- 16      mMRC Score   mMRC Score 4 --             UCSD: Self-administered rating of dyspnea associated with activities of daily living (ADLs) 6-point scale (0 = "not at all" to 5 = "maximal or unable to do because of breathlessness")  Scoring Scores range from 0 to 120.  Minimally important difference is 5 units  CAT: CAT can identify the health impairment of COPD patients and is better correlated with disease progression.  CAT has a scoring range of zero to 40. The CAT score is classified into four groups of low (less than 10), medium (10 - 20), high (21-30) and very high (31-40) based on the impact level of disease on health status. A CAT score over 10 suggests significant symptoms.  A worsening CAT score could be explained by an exacerbation, poor medication adherence, poor inhaler technique, or progression of COPD or comorbid conditions.  CAT MCID is 2 points  mMRC: mMRC (Modified Medical Research Council) Dyspnea Scale is used to assess the degree of baseline functional disability in patients of respiratory disease due to dyspnea. No minimal important difference is established. A decrease in score of 1 point or greater is considered a positive change.   Pulmonary Function Assessment:   Exercise Target Goals: Exercise Program Goal: Individual exercise prescription set using results from initial 6 min walk test and  THRR while considering  patient's activity barriers and safety.   Exercise Prescription Goal: Initial exercise prescription builds to 30-45 minutes a day of aerobic activity, 2-3 days per week.  Home exercise guidelines will be given to patient during program as part of exercise prescription that the participant will acknowledge.  Education: Aerobic Exercise: - Group verbal  and visual presentation on the components of exercise prescription. Introduces F.I.T.T principle from ACSM for exercise prescriptions.  Reviews F.I.T.T. principles of aerobic exercise including progression. Written material given at graduation.   Education: Resistance Exercise: - Group verbal and visual presentation on the components of exercise prescription. Introduces F.I.T.T principle from ACSM for exercise prescriptions  Reviews F.I.T.T. principles of resistance exercise including progression. Written material given at graduation.    Education: Exercise & Equipment Safety: - Individual verbal instruction and demonstration of equipment use and safety with use of the equipment. Flowsheet Row Pulmonary Rehab from 09/02/2022 in [facility] Cardiac and Pulmonary Rehab  Date 06/02/22  Educator [facility]  Instruction Review Code 1- Verbalizes Understanding       Education: Exercise Physiology & General Exercise Guidelines: - Group verbal and written instruction with models to review the exercise physiology of the cardiovascular system and associated critical values. Provides general exercise guidelines with specific guidelines to those with heart or lung disease.  Flowsheet Row Pulmonary Rehab from 09/02/2022 in [facility] Cardiac and Pulmonary Rehab  Date 07/01/22  Educator [facility]  Instruction Review Code 1- Verbalizes Understanding       Education: Flexibility, Balance, Mind/Body Relaxation: - Group verbal and visual presentation with interactive activity on the components of exercise prescription. Introduces F.I.T.T principle from ACSM  for exercise prescriptions. Reviews F.I.T.T. principles of flexibility and balance exercise training including progression. Also discusses the mind body connection.  Reviews various relaxation techniques to help reduce and manage stress (i.e. Deep breathing, progressive muscle relaxation, and visualization). Balance handout provided to take home. Written material given at graduation.   Activity Barriers & Risk Stratification:  Activity Barriers & Cardiac Risk Stratification - 06/02/22 1127       Activity Barriers & Cardiac Risk Stratification   Activity Barriers Shortness of Breath;Deconditioning;Muscular Weakness             6 Minute Walk:  6 Minute Walk     Row Name 06/02/22 1125         6 Minute Walk   Phase Initial     Distance 1261 feet     Walk Time 6 minutes     # of Rest Breaks 0     MPH 2.39     METS 5.42     RPE 13     Perceived Dyspnea  3     VO2 Peak 18.99     Symptoms Yes (comment)     Comments SOB     Resting HR 102 bpm     Resting BP 104/58     Resting Oxygen Saturation  87 %     Exercise Oxygen Saturation  during 6 min walk 79 %     Max Ex. HR 125 bpm     Max Ex. BP 136/74     2 Minute Post BP 122/66       Interval HR   1 Minute HR 113     2 Minute HR 117     3 Minute HR 122     4 Minute HR 125     5 Minute HR 125     6 Minute HR 125     2 Minute Post HR 107     Interval Heart Rate? Yes       Interval Oxygen   Interval Oxygen? Yes     Baseline Oxygen Saturation % 93 %     1 Minute Oxygen Saturation % 87 %     1 Minute  Liters of Oxygen 0 L  Room Air     2 Minute Oxygen Saturation % 86 %     2 Minute Liters of Oxygen 0 L     3 Minute Oxygen Saturation % 81 %     3 Minute Liters of Oxygen 0 L     4 Minute Oxygen Saturation % 80 %     4 Minute Liters of Oxygen 0 L     5 Minute Oxygen Saturation % 80 %     5 Minute Liters of Oxygen 0 L     6 Minute Oxygen Saturation % 81 %  79% at 5:30     6 Minute Liters of Oxygen 0 L     2 Minute Post  Oxygen Saturation % 90 %     2 Minute Post Liters of Oxygen 0 L             Oxygen Initial Assessment:  Oxygen Initial Assessment - 06/02/22 1136       Home Oxygen   Home Oxygen Device E-Tanks;Home Concentrator    Sleep Oxygen Prescription None   2L when saturations get low at night as well   Home Exercise Oxygen Prescription None   encouraged to use   Home Resting Oxygen Prescription None    Compliance with Home Oxygen Use Yes      Initial 6 min Walk   Oxygen Used None      Program Oxygen Prescription   Program Oxygen Prescription Continuous;E-Tanks    Liters per minute 2    Comments use 1-2L for exercise to avoid desaturations with exercise      Intervention   Short Term Goals To learn and exhibit compliance with exercise, home and travel O2 prescription;To learn and understand importance of monitoring SPO2 with pulse oximeter and demonstrate accurate use of the pulse oximeter.;To learn and understand importance of maintaining oxygen saturations>88%;To learn and demonstrate proper pursed lip breathing techniques or other breathing techniques. ;To learn and demonstrate proper use of respiratory medications    Long  Term Goals Exhibits compliance with exercise, home  and travel O2 prescription;Verbalizes importance of monitoring SPO2 with pulse oximeter and return demonstration;Maintenance of O2 saturations>88%;Exhibits proper breathing techniques, such as pursed lip breathing or other method taught during program session;Compliance with respiratory medication;Demonstrates proper use of MDI's             Oxygen Re-Evaluation:  Oxygen Re-Evaluation     Row Name 06/03/22 1123 06/17/22 0959 08/12/22 1540         Program Oxygen Prescription   Program Oxygen Prescription -- Continuous;E-Tanks Continuous;E-Tanks     Liters per minute -- 2 --     Comments -- use 1-2L for exercise to avoid desaturations with exercise use 1-2L for exercise to avoid desaturations with exercise        Home Oxygen   Home Oxygen Device -- E-Tanks;Home Concentrator E-Tanks;Home Concentrator     Sleep Oxygen Prescription -- None None     Home Exercise Oxygen Prescription -- None None     Home Resting Oxygen Prescription -- None None     Compliance with Home Oxygen Use -- Yes Yes       Goals/Expected Outcomes   Short Term Goals -- To learn and exhibit compliance with exercise, home and travel O2 prescription;To learn and understand importance of monitoring SPO2 with pulse oximeter and demonstrate accurate use of the pulse oximeter.;To learn and understand importance of maintaining oxygen saturations>88%;To learn and demonstrate  proper pursed lip breathing techniques or other breathing techniques. ;To learn and demonstrate proper use of respiratory medications To learn and exhibit compliance with exercise, home and travel O2 prescription;To learn and understand importance of monitoring SPO2 with pulse oximeter and demonstrate accurate use of the pulse oximeter.;To learn and understand importance of maintaining oxygen saturations>88%;To learn and demonstrate proper pursed lip breathing techniques or other breathing techniques. ;To learn and demonstrate proper use of respiratory medications     Long  Term Goals -- Exhibits compliance with exercise, home  and travel O2 prescription;Verbalizes importance of monitoring SPO2 with pulse oximeter and return demonstration;Maintenance of O2 saturations>88%;Exhibits proper breathing techniques, such as pursed lip breathing or other method taught during program session;Compliance with respiratory medication;Demonstrates proper use of MDI's Exhibits compliance with exercise, home  and travel O2 prescription;Verbalizes importance of monitoring SPO2 with pulse oximeter and return demonstration;Maintenance of O2 saturations>88%;Exhibits proper breathing techniques, such as pursed lip breathing or other method taught during program session;Compliance with respiratory  medication;Demonstrates proper use of MDI's     Comments Reviewed PLB technique with pt.  Talked about how it works and it's importance in maintaining their exercise saturations. We reviewed PLB techique, and Sean [name] states that he has continued to practice PLB especially during exercise. He also states that he has been monitoring his oxygen saturations and reports that they have been dropping to 89-90% during exercise, but recovers well. Sean [name] is doing well in rehab other than consistent attendance.  His breathing is starting to get better and he is not using his oxygen as much at home.  Mostly only needs it when he is walking.  He has been able to lift weights and do stuff around house without as long as he takes rest breaks.  His saturations are staying in low 90s at home.  He was encouraged to conintue to use PLB to help with breathing and to use his oxygen for cardio.     Goals/Expected Outcomes Short: Become more profiecient at using PLB. Long: Become independent at using PLB. Short: Become more profiecient at using PLB. Long: Become independent at using PLB. Short: Use oxygen for cardio Long: conitnue to use PLB              Oxygen Discharge (Final Oxygen Re-Evaluation):  Oxygen Re-Evaluation - 08/12/22 1540       Program Oxygen Prescription   Program Oxygen Prescription Continuous;E-Tanks    Comments use 1-2L for exercise to avoid desaturations with exercise      Home Oxygen   Home Oxygen Device E-Tanks;Home Concentrator    Sleep Oxygen Prescription None    Home Exercise Oxygen Prescription None    Home Resting Oxygen Prescription None    Compliance with Home Oxygen Use Yes      Goals/Expected Outcomes   Short Term Goals To learn and exhibit compliance with exercise, home and travel O2 prescription;To learn and understand importance of monitoring SPO2 with pulse oximeter and demonstrate accurate use of the pulse oximeter.;To learn and understand importance of maintaining oxygen  saturations>88%;To learn and demonstrate proper pursed lip breathing techniques or other breathing techniques. ;To learn and demonstrate proper use of respiratory medications    Long  Term Goals Exhibits compliance with exercise, home  and travel O2 prescription;Verbalizes importance of monitoring SPO2 with pulse oximeter and return demonstration;Maintenance of O2 saturations>88%;Exhibits proper breathing techniques, such as pursed lip breathing or other method taught during program session;Compliance with respiratory medication;Demonstrates proper use of MDI's    Comments  Rowdy is doing well in rehab other than consistent attendance.  His breathing is starting to get better and he is not using his oxygen as much at home.  Mostly only needs it when he is walking.  He has been able to lift weights and do stuff around house without as long as he takes rest breaks.  His saturations are staying in low 90s at home.  He was encouraged to conintue to use PLB to help with breathing and to use his oxygen for cardio.    Goals/Expected Outcomes Short: Use oxygen for cardio Long: conitnue to use PLB             Initial Exercise Prescription:  Initial Exercise Prescription - 06/02/22 1100       Date of Initial Exercise RX and Referring Provider   Date 06/02/22    Referring Provider Josph Macho MD      Oxygen   Oxygen Continuous    Liters 1-2    Maintain Oxygen Saturation 88% or higher      Treadmill   MPH 2.3    Grade 0.5    Minutes 15    METs 2.92      REL-XR   Level 2    Speed 50    Minutes 15    METs 2.5      T5 Nustep   Level 2    SPM 80    Minutes 15    METs 2.5      Track   Laps 34    Minutes 15    METs 2.85      Prescription Details   Frequency (times per week) 2    Duration Progress to 30 minutes of continuous aerobic without signs/symptoms of physical distress      Intensity   THRR 40-80% of Max Heartrate 132-161    Ratings of Perceived Exertion 11-13     Perceived Dyspnea 0-4      Progression   Progression Continue to progress workloads to maintain intensity without signs/symptoms of physical distress.      Resistance Training   Training Prescription Yes    Weight 5 lb    Reps 10-15             Perform Capillary Blood Glucose checks as needed.  Exercise Prescription Changes:   Exercise Prescription Changes     Row Name 06/02/22 1100 06/07/22 1000 06/21/22 1300 07/01/22 1000 07/05/22 1100     Response to Exercise   Blood Pressure (Admit) 104/58 118/62 124/80 -- 126/72   Blood Pressure (Exercise) 136/74 128/68 134/80 -- 128/68   Blood Pressure (Exit) 108/60 106/62 108/70 -- 110/66   Heart Rate (Admit) 102 bpm 98 bpm 97 bpm -- 98 bpm   Heart Rate (Exercise) 125 bpm 111 bpm 131 bpm -- 119 bpm   Heart Rate (Exit) 99 bpm 104 bpm 106 bpm -- 104 bpm   Oxygen Saturation (Admit) 93 % 94 % 92 % -- 95 %   Oxygen Saturation (Exercise) 79 % 89 % 89 % -- 90 %   Oxygen Saturation (Exit) 95 % 94 % 94 % -- 96 %   Rating of Perceived Exertion (Exercise) 13 13 14  -- 13   Perceived Dyspnea (Exercise) 3 1 2  -- 1   Symptoms SOB SOB SOB -- SOB   Comments walk test results 1st full day of exercise -- -- --   Duration -- Progress to 30 minutes of  aerobic without signs/symptoms of physical distress  Continue with 30 min of aerobic exercise without signs/symptoms of physical distress. -- Continue with 30 min of aerobic exercise without signs/symptoms of physical distress.   Intensity -- THRR unchanged THRR unchanged -- THRR unchanged     Progression   Progression -- Continue to progress workloads to maintain intensity without signs/symptoms of physical distress. Continue to progress workloads to maintain intensity without signs/symptoms of physical distress. -- Continue to progress workloads to maintain intensity without signs/symptoms of physical distress.   Average METs -- 2.85 2.51 -- 3.06     Resistance Training   Training Prescription -- Yes  Yes -- Yes   Weight -- 5 lb  bands 5 lb -- 5 lb   Reps -- 10-15 10-15 -- 10-15     Interval Training   Interval Training -- No No -- No     Oxygen   Oxygen -- Continuous Continuous -- Continuous   Liters -- 1-2 1-2 -- 2     Treadmill   MPH -- -- 2.3 -- 2.3   Grade -- -- 0.5 -- 0.5   Minutes -- -- 15 -- 15   METs -- -- 2.92 -- 2.92     Recumbant Elliptical   Level -- -- -- -- 3   Minutes -- -- -- -- 15   METs -- -- -- -- 2.4     REL-XR   Level -- 3 2 -- 2   Minutes -- 15 15 -- 15   METs -- 4 3.7 -- 3     T5 Nustep   Level -- -- 3 -- --   Minutes -- -- 15 -- --   METs -- -- 1.9 -- --     Track   Laps -- 13 30 -- --   Minutes -- 15 15 -- --   METs -- 1.71 2.63 -- --     Home Exercise Plan   Plans to continue exercise at -- -- -- Home (comment)  walking, weights, staff videos Home (comment)  walking, weights, staff videos   Frequency -- -- -- Add 3 additional days to program exercise sessions. Add 3 additional days to program exercise sessions.   Initial Home Exercises Provided -- -- -- 07/01/22 07/01/22     Oxygen   Maintain Oxygen Saturation -- 88% or higher 88% or higher -- 88% or higher    Row Name 07/19/22 1400 08/16/22 0900 08/30/22 1000 09/13/22 1400       Response to Exercise   Blood Pressure (Admit) 132/70 112/68 118/64 118/60    Blood Pressure (Exercise) 130/68 -- -- --    Blood Pressure (Exit) 118/70 126/70 104/60 122/60    Heart Rate (Admit) 104 bpm 95 bpm 81 bpm 95 bpm    Heart Rate (Exercise) 113 bpm 149 bpm 122 bpm 109 bpm    Heart Rate (Exit) 113 bpm 99 bpm 100 bpm 101 bpm    Oxygen Saturation (Admit) 90 % 95 % 96 % 93 %    Oxygen Saturation (Exercise) 88 % 88 % 88 % 90 %    Oxygen Saturation (Exit) 96 % 95 % 96 % 96 %    Rating of Perceived Exertion (Exercise) 11 11 13 11     Perceived Dyspnea (Exercise) -- -- 2 --    Symptoms SOB SOB SOB SOB    Duration Continue with 30 min of aerobic exercise without signs/symptoms of physical distress.  Continue with 30 min of aerobic exercise without signs/symptoms of physical distress. Continue with  30 min of aerobic exercise without signs/symptoms of physical distress. Continue with 30 min of aerobic exercise without signs/symptoms of physical distress.    Intensity THRR unchanged THRR unchanged THRR unchanged THRR unchanged      Progression   Progression Continue to progress workloads to maintain intensity without signs/symptoms of physical distress. Continue to progress workloads to maintain intensity without signs/symptoms of physical distress. Continue to progress workloads to maintain intensity without signs/symptoms of physical distress. Continue to progress workloads to maintain intensity without signs/symptoms of physical distress.    Average METs 3.61 3.56 4.03 2.97      Resistance Training   Training Prescription Yes Yes Yes Yes    Weight 5 lb 5 lb 7 lb 7 lb    Reps 10-15 10-15 10-15 10-15      Interval Training   Interval Training No No No No      Oxygen   Oxygen Continuous Continuous Continuous Continuous    Liters 2 2 2 2       Treadmill   MPH 2.1 2 3  2.5    Grade 0 0.5 0.5 1.5    Minutes 15 15 15 15     METs 2.61 2.67 3.5 3.43      Recumbant Elliptical   Level -- -- 3 3    Minutes -- -- 15 15    METs -- -- 3.9 2.5      REL-XR   Level 5 5 7  --    Minutes 15 15 15  --    METs 4.6 4.6 5.1 --      Home Exercise Plan   Plans to continue exercise at Home (comment)  walking, weights, staff videos Home (comment)  walking, weights, staff videos Home (comment)  walking, weights, staff videos Home (comment)  walking, weights, staff videos    Frequency Add 3 additional days to program exercise sessions. Add 3 additional days to program exercise sessions. Add 3 additional days to program exercise sessions. Add 3 additional days to program exercise sessions.    Initial Home Exercises Provided 07/01/22 07/01/22 07/01/22 07/01/22      Oxygen   Maintain Oxygen Saturation 88% or  higher 88% or higher 88% or higher 88% or higher             Exercise Comments:   Exercise Comments     Row Name 06/03/22 1122           Exercise Comments First full day of exercise!  Patient was oriented to gym and equipment including functions, settings, policies, and procedures.  Patient's individual exercise prescription and treatment plan were reviewed.  All starting workloads were established based on the results of the 6 minute walk test done at initial orientation visit.  The plan for exercise progression was also introduced and progression will be customized based on patient's performance and goals.                Exercise Goals and Review:   Exercise Goals     Row Name 06/02/22 1134             Exercise Goals   Increase Physical Activity Yes       Intervention Provide advice, education, support and counseling about physical activity/exercise needs.;Develop an individualized exercise prescription for aerobic and resistive training based on initial evaluation findings, risk stratification, comorbidities and participant's personal goals.       Expected Outcomes Short Term: Attend rehab on a regular basis to increase amount of physical activity.;Long  Term: Exercising regularly at least 3-5 days a week.;Long Term: Add in home exercise to make exercise part of routine and to increase amount of physical activity.       Increase Strength and Stamina Yes       Intervention Provide advice, education, support and counseling about physical activity/exercise needs.;Develop an individualized exercise prescription for aerobic and resistive training based on initial evaluation findings, risk stratification, comorbidities and participant's personal goals.       Expected Outcomes Short Term: Increase workloads from initial exercise prescription for resistance, speed, and METs.;Short Term: Perform resistance training exercises routinely during rehab and add in resistance training at  home;Long Term: Improve cardiorespiratory fitness, muscular endurance and strength as measured by increased METs and functional capacity ( )       Able to understand and use rate of perceived exertion (RPE) scale Yes       Intervention Provide education and explanation on how to use RPE scale       Expected Outcomes Short Term: Able to use RPE daily in rehab to express subjective intensity level;Long Term:  Able to use RPE to guide intensity level when exercising independently       Able to understand and use Dyspnea scale Yes       Intervention Provide education and explanation on how to use Dyspnea scale       Expected Outcomes Short Term: Able to use Dyspnea scale daily in rehab to express subjective sense of shortness of breath during exertion;Long Term: Able to use Dyspnea scale to guide intensity level when exercising independently       Knowledge and understanding of Target Heart Rate Range (THRR) Yes       Intervention Provide education and explanation of THRR including how the numbers were predicted and where they are located for reference       Expected Outcomes Short Term: Able to state/look up THRR;Short Term: Able to use daily as guideline for intensity in rehab;Long Term: Able to use THRR to govern intensity when exercising independently       Able to check pulse independently Yes       Intervention Provide education and demonstration on how to check pulse in carotid and radial arteries.;Review the importance of being able to check your own pulse for safety during independent exercise       Expected Outcomes Short Term: Able to explain why pulse checking is important during independent exercise;Long Term: Able to check pulse independently and accurately       Understanding of Exercise Prescription Yes       Intervention Provide education, explanation, and written materials on patient's individual exercise prescription       Expected Outcomes Short Term: Able to explain program  exercise prescription;Long Term: Able to explain home exercise prescription to exercise independently                Exercise Goals Re-Evaluation :  Exercise Goals Re-Evaluation     Row Name 06/03/22 1122 06/07/22 1040 06/17/22 0947 06/21/22 1342 06/29/22 1023     Exercise Goal Re-Evaluation   Exercise Goals Review Able to understand and use rate of perceived exertion (RPE) scale;Able to understand and use Dyspnea scale;Knowledge and understanding of Target Heart Rate Range (THRR);Understanding of Exercise Prescription Understanding of Exercise Prescription;Increase Physical Activity;Increase Strength and Stamina Understanding of Exercise Prescription;Increase Physical Activity;Increase Strength and Stamina Understanding of Exercise Prescription;Increase Physical Activity;Increase Strength and Stamina Increase Physical Activity   Comments Reviewed RPE scale, THR  and program prescription with pt today.  Pt voiced understanding and was given a copy of goals to take home. Sean [name] did well his first day of rehab. He was able to exercise on level 3 on the XR and was able to walk the track around 15 laps. He uses his oxygen on as needed basis, and used 2L when walking. We will monitor to ensure his oxygen saturations stay above 88% at all times. We will continue to monitor as he progresses in the program. Sean [name] is doing well in rehab. He feels that his breathing and stamina have improved since he began the program. He has began to improve his workloads and laps on the track. He has been going to the the [facility] for resistance training with handweights. We talked about possibly starting to walk the track some at the [facility] as well. We will continue to monitor his progress in the program. Sean [name] is doing well in the program. He has done well with the XR at level 2 and improved to level 3 on the T5 nustep. He also walked up to 30 laps on the track and has continued to walk at a speed of 2.3 mph with a 0.5%  incline on the treadmill. He has still been rating his shortness of breath as a 2 on the dyspnea scale during exercise. We will continue to monitor his progress in the program. Sean [name] was encouraged to start walking down to rehab with his oxygen pushing wheelchair.   Expected Outcomes Short: Use RPE daily to regulate intensity. Long: Follow program prescription in THR. ShorT: Continue to follow initial exercise prescription Long: Build up overall strength and stamina Short: Begin walking at the [facility] along with resistance training. Long: Continue to improve strength and stamina Short: Progressively increase treadmill workload. Long: Continue to improve strength and stamina. Short: Start to walk to rehab Long: Continue to improve stamina    Row Name 07/01/22 1012 07/05/22 1105 07/19/22 1432 08/02/22 1254 08/12/22 1034     Exercise Goal Re-Evaluation   Exercise Goals Review Increase Physical Activity;Increase Strength and Stamina;Able to understand and use rate of perceived exertion (RPE) scale;Able to understand and use Dyspnea scale;Knowledge and understanding of Target Heart Rate Range (THRR);Able to check pulse independently;Understanding of Exercise Prescription Increase Physical Activity;Increase Strength and Stamina;Understanding of Exercise Prescription Increase Physical Activity;Increase Strength and Stamina;Understanding of Exercise Prescription Increase Physical Activity;Increase Strength and Stamina;Understanding of Exercise Prescription Increase Physical Activity;Increase Strength and Stamina;Understanding of Exercise Prescription   Comments Reviewed home exercise with pt today.  Pt plans to walk and use weights with his weight machine at home for exercise.  We also talked about using staff videos as an option as well.  Reviewed THR, pulse, RPE, sign and symptoms, pulse oximetry and when to call 911 or MD.  Also discussed weather considerations and indoor options.  Pt voiced understanding.  Sean [name] is doing well in rehab. He has stayed consistent at 2.3/1% workload on the treadmill and would benefit from increasing that. He tried out the REL for the first time and was able to work out at level 3. His oxygen saturations are staying above 88% and RPEs are staying appropriate. Will continue to monitor. Sean [name] has only attended rehab once since the last review. During his one session he walked the treadmill at 2.1 mph with no incline. He also improved from level 2 to level 5 on the XR. He has continued to use 5 lb hand weights for resistance training as well. We  will continue to monitor his progress in the program. Sean [name] has not attended rehab since 3/26, he had a procedure and has several appts leading up to that. Once patient is cleared to come back, we will encourage to maintain consistent attendance. Sean [name] is doing well in rehab.  He has only had a handful of session since last review due to not feeling well and other appointments.  We talked about making sure he works to get into rehab to see a better improvement.  We talked about importance of getting cardio in to train oxygen mobilization.  He is doing weights at home to build muscle.  We also talked about the importance of getting to rehab as he builds towards transplant.   Expected Outcomes Short: Start to add in more walking at home Long: Continue to exercise independently Short: Increase speed on treadmill Long: Continue to increase overall MET level and stamina Short: Return to regular attendance in the program. Long: Continue to improve strength and stamina. Short: Return to rehab when cleared Long: Graduate from [employer] Short: Regular attend at rehab Long: Add in more cardio at home.    Row Name 08/16/22 0935 08/30/22 1046 09/13/22 1411 09/29/22 1422 10/11/22 0847     Exercise Goal Re-Evaluation   Exercise Goals Review Increase Physical Activity;Increase Strength and Stamina;Understanding of Exercise Prescription Increase  Physical Activity;Increase Strength and Stamina;Understanding of Exercise Prescription Increase Physical Activity;Increase Strength and Stamina;Understanding of Exercise Prescription Increase Physical Activity;Increase Strength and Stamina;Understanding of Exercise Prescription --   Comments Sean [name] returned for two sessions after not attending rehab since 07/06/2022. During his two sessions he was able to work on the treadmill at a speed of 2 mph and 0.5% incline. He also worked at level 5 on the XR and continued to use 5 lb hand weights for resistance training. We will continue to monitor his progress in the program. Sean [name] continues to do well in rehab. He increased his treadmill to a 3.0 speed and also increased to level 7 on the XR, working over 5 METS! He is now using 7 lb handweights as well. We will continue to monitor. Sean [name] has only attended rehab once since the last review. He was able to increase his incline on the treadmill to 1.5% while maintaining his speed at 2.5 mph. He also continued to work at level 3 on the recumbent elliptical. He consistently used 7 lb hand weights for resistance training as well. We will continue to monitor his progress in the program. Sean [name] has not attended rehab since the last review. We will reach out to patient to see when he plans to return to rehab. We will continue to monitor his progress when he returns to rehab. Sean [name] has not attended rehab since 09/02/2022. We called to check in on patient who reported he would return to rehab on 07/01. We will continue to monitor his progress when he returns to rehab.   Expected Outcomes Short: Attend rehab regularly. Long: Continue to improve strength and stamina. Short: Continue to slowly increase incline on treadmill Long: Continue to increase overall MET level and stamina Short: Return to regular, consistent attendance in the program. Long: Continue to increase strength and stamina. Short: Return to regular,  consistent attendance in the program. Long: Continue to increase strength and stamina. Short: Return to regular, consistent attendance in the program. Long: Continue to increase strength and stamina.            Discharge Exercise Prescription (Final Exercise Prescription Changes):  Exercise  Prescription Changes - 09/13/22 1400       Response to Exercise   Blood Pressure (Admit) 118/60    Blood Pressure (Exit) 122/60    Heart Rate (Admit) 95 bpm    Heart Rate (Exercise) 109 bpm    Heart Rate (Exit) 101 bpm    Oxygen Saturation (Admit) 93 %    Oxygen Saturation (Exercise) 90 %    Oxygen Saturation (Exit) 96 %    Rating of Perceived Exertion (Exercise) 11    Symptoms SOB    Duration Continue with 30 min of aerobic exercise without signs/symptoms of physical distress.    Intensity THRR unchanged      Progression   Progression Continue to progress workloads to maintain intensity without signs/symptoms of physical distress.    Average METs 2.97      Resistance Training   Training Prescription Yes    Weight 7 lb    Reps 10-15      Interval Training   Interval Training No      Oxygen   Oxygen Continuous    Liters 2      Treadmill   MPH 2.5    Grade 1.5    Minutes 15    METs 3.43      Recumbant Elliptical   Level 3    Minutes 15    METs 2.5      Home Exercise Plan   Plans to continue exercise at Home (comment)   walking, weights, staff videos   Frequency Add 3 additional days to program exercise sessions.    Initial Home Exercises Provided 07/01/22      Oxygen   Maintain Oxygen Saturation 88% or higher             Nutrition:  Target Goals: Understanding of nutrition guidelines, daily intake of sodium 1500mg , cholesterol 200mg , calories 30% from fat and 7% or less from saturated fats, daily to have 5 or more servings of fruits and vegetables.  Education: All About Nutrition: -Group instruction provided by verbal, written material, interactive activities,  discussions, models, and posters to present general guidelines for heart healthy nutrition including fat, fiber, MyPlate, the role of sodium in heart healthy nutrition, utilization of the nutrition label, and utilization of this knowledge for meal planning. Follow up email sent as well. Written material given at graduation.   Biometrics:  Pre Biometrics - 06/02/22 1134       Pre Biometrics   Height 6' 5.5" (1.969 m)    Weight 153 lb 8 oz (69.6 kg)    Waist Circumference 28 inches    Hip Circumference 32 inches    Waist to Hip Ratio 0.88 %    BMI (Calculated) 17.96    Single Leg Stand 30 seconds              Nutrition Therapy Plan and Nutrition Goals:  Nutrition Therapy & Goals - 06/28/22 1333       Nutrition Therapy   Diet Heart healthy, low Na, high kal/high protein    Protein (specify units) 125g    Fiber 35 grams    Whole Grain Foods 3 servings    Saturated Fats 16 max. grams    Fruits and Vegetables 8 servings/day    Sodium 2 grams      Personal Nutrition Goals   Nutrition Goal ST: build meals with heart healthy fats, fiber, and protein. Cut down on salt used after cooking. Review paperwork and Increase involvement in meal planning/prepping.  LT:  follow MyPlate guidelines, gain weight and muscle mass, limit Na <2g/day    Comments 47 y.o. M admitted to pulmonary rehab for ILD. 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 Reviewed relevant medications: humira,furosemide (pt reports no longer prescribed) , GI Cocktail (RULOX,LIDOCAINE), hydroxychloroquine, mycophenolate, pantoprazole, prednisone, breztri, vit D2 . B: grilled cheese sandwich or oatmeal L: big meal- chicken sub or philly cheese steak sub S:granola bar before gym and protein shake after gym D: pizza or meat or chicken with vegetables. Sean [name] reports not cooking or shopping and  that usually his girlfriend or mom will cook for him. His mom reports that they bake or grill and use olive oil, she uses mrs dash, but does not add salt while cooking. Neftaly reports using salt afterwards, but he is not sure how much he adds. He reports going out to eat 3-4x week. Drinks: sweet tea or fruit punch or sometimes soda. He reports that he has never made changes to his diet before. He reports that his weight has been stable - he lost weight (~60lbs over the course of 4-5 months) when he first started getting sick - he feels the medication dulled his appetite and he reports his appetetite has improved. Landy reports using serious mass protein, but onlya half a scoop - this provides 630 calories and 25g of protein; discussed how this also contains many vitamins and minerals and cautioned against exceeding recommended doses. Encouraged to make sure any supplements are third party tested. Discussed general healthy eating and pulmonary MNT. Discussed high calorie/high protein diet and encouraged eating foods higher in fat to increase calories without increased volume - discussed how most of these fats should ideally come from healthy/nutritionally dense sources; peanut butter, liquid plant oils, avocado, and whole fat greek yogurt. Discussed that he could have another scoop of protein powder (2 scoops total/day) for another snack or sip on it in between meals. Encouraged him to become more involved in the cooking and meal planning/shopping process.      Intervention Plan   Intervention Prescribe, educate and counsel regarding individualized specific dietary modifications aiming towards targeted core components such as weight, hypertension, lipid management, diabetes, heart failure and other comorbidities.    Expected Outcomes Short Term Goal: Understand basic principles of dietary content, such as calories, fat, sodium, cholesterol and nutrients.;Short Term Goal: A plan has been developed with  personal nutrition goals set during dietitian appointment.;Long Term Goal: Adherence to prescribed nutrition plan.             Nutrition Assessments:  MEDIFICTS Score Key: ?70 Need to make dietary changes  40-70 Heart Healthy Diet ? 40 Therapeutic Level Cholesterol Diet  Flowsheet Row Pulmonary Rehab from 06/08/2022 in [facility] Cardiac [facility] Pulmonary Rehab  Picture Your Plate Total Score on Admission 52      Picture Your Plate Scores: <09 Unhealthy dietary pattern with much room for improvement. 41-50 Dietary pattern unlikely to meet recommendations for good health and room for improvement. 51-60 More healthful dietary pattern, with some room for improvement.  >60 Healthy dietary pattern, although there may be some specific behaviors that could be improved.   Nutrition Goals Re-Evaluation:  Nutrition Goals Re-Evaluation     Row Name 06/17/22 1003 08/12/22 1047           Goals   Nutrition Goal -- ST: build meals with heart healthy fats, fiber, and protein. Cut down on salt used  after cooking. Review paperwork and Increase involvement in meal planning/prepping.  LT: follow MyPlate guidelines, gain weight and muscle mass, limit Na <2g/day      Comment Cashel is looking forward to meeting with the RD and has a meeting scheduled for next week. He is looking forward to talking to the RD about healthy eating patterns to help him gain weight. Lyncoln is working on his diet.  He has been able to increase some of his protein intake to build muscle.  He is not adding as much salt as he did before.  He continues to work on meal planning.      Expected Outcome Short: Meet with RD. Long: Implement dietary patterns discussed with RD. Short: Continue to increase protein Long: Conitnue to improve diet               Nutrition Goals Discharge (Final Nutrition Goals Re-Evaluation):  Nutrition Goals Re-Evaluation - 08/12/22 1047       Goals   Nutrition Goal ST: build meals with heart  healthy fats, fiber, and protein. Cut down on salt used after cooking. Review paperwork and Increase involvement in meal planning/prepping.  LT: follow MyPlate guidelines, gain weight and muscle mass, limit Na <2g/day    Comment Zahki is working on his diet.  He has been able to increase some of his protein intake to build muscle.  He is not adding as much salt as he did before.  He continues to work on meal planning.    Expected Outcome Short: Continue to increase protein Long: Conitnue to improve diet             Psychosocial: Target Goals: Acknowledge presence or absence of significant depression and/or stress, maximize coping skills, provide positive support system. Participant is able to verbalize types and ability to use techniques and skills needed for reducing stress and depression.   Education: Stress, Anxiety, and Depression - Group verbal and visual presentation to define topics covered.  Reviews how body is impacted by stress, anxiety, and depression.  Also discusses healthy ways to reduce stress and to treat/manage anxiety and depression.  Written material given at graduation. Flowsheet Row Pulmonary Rehab from 09/02/2022 in [facility] Cardiac and Pulmonary Rehab  Date 09/02/22  Educator [facility]  Instruction Review Code 1- [employer] Understanding       Education: Sleep Hygiene -Provides group verbal and written instruction about how sleep can affect your health.  Define sleep hygiene, discuss sleep cycles and impact of sleep habits. Review good sleep hygiene tips.    Initial Review & Psychosocial Screening:  Initial Psych Review & Screening - 05/25/22 1308       Initial Review   Current issues with Current Stress Concerns    Source of Stress Concerns Unable to perform yard/household activities;Unable to participate in former interests or hobbies      Family Dynamics   Good Support System? Yes   mom     Barriers   Psychosocial barriers to participate in program There are no  identifiable barriers or psychosocial needs.;The patient should benefit from training in stress management and relaxation.      Screening Interventions   Interventions Encouraged to exercise;Provide feedback about the scores to participant;To provide support and resources with identified psychosocial needs    Expected Outcomes Long Term Goal: Stressors or current issues are controlled or eliminated.;Short Term goal: Utilizing psychosocial counselor, staff and physician to assist with identification of specific Stressors or current issues interfering with healing process. Setting desired  goal for each stressor or current issue identified.;Short Term goal: Identification and review with participant of any Quality of Life or Depression concerns found by scoring the questionnaire.;Long Term goal: The participant improves quality of Life and PHQ9 Scores as seen by post scores and/or verbalization of changes             Quality of Life Scores:  Scores of 19 and below usually indicate a poorer quality of life in these areas.  A difference of  2-3 points is a clinically meaningful difference.  A difference of 2-3 points in the total score of the Quality of Life Index has been associated with significant improvement in overall quality of life, self-image, physical symptoms, and general health in studies assessing change in quality of life.  PHQ-9: Review Flowsheet       06/29/2022 06/02/2022 10/22/2021  Depression screen PHQ 2/9  Decreased Interest 1 2 0  Down, Depressed, Hopeless 0 1 0  PHQ - 2 Score 1 3 0  Altered sleeping 1 1 -  Tired, decreased energy 1 2 -  Change in appetite 0 1 -  Feeling bad or failure about yourself  0 1 -  Trouble concentrating 0 0 -  Moving slowly or fidgety/restless 0 1 -  Suicidal thoughts 0 0 -  PHQ-9 Score 3 9 -  Difficult doing work/chores Somewhat difficult Very difficult -   Interpretation of Total Score  Total Score Depression Severity:  1-4 = Minimal  depression, 5-9 = Mild depression, 10-14 = Moderate depression, 15-19 = Moderately severe depression, 20-27 = Severe depression   Psychosocial Evaluation and Intervention:  Psychosocial Evaluation - 05/25/22 1318       Psychosocial Evaluation & Interventions   Interventions Encouraged to exercise with the program and follow exercise prescription    Comments Mr. Leavy is coming to pulmonary rehab with worsening ILD. He reports that since he started getting sick a few years ago, he hasn't really felt like doing much. He finds himself lying around more and getting more winded when he does get up to do things. He used to work in a [employer] and enjoy exercising. He wants to get back some of the strength he has lost. He knows it will be a journey getting back to a more active lifestyle and is wary of his breathing's reaction, but he wants to put in the work. He enjoys playing video games for fun and has a good support system.    Expected Outcomes Short: attend pulmonary rehab for education and exercise. Long: develop and maintain positive self care habits.    Continue Psychosocial Services  Follow up required by staff             Psychosocial Re-Evaluation:  Psychosocial Re-Evaluation     Row Name 06/17/22 1610 06/29/22 1010 08/12/22 1042         Psychosocial Re-Evaluation   Current issues with Current Sleep Concerns;Current Psychotropic Meds Current Psychotropic Meds Current Psychotropic Meds     Comments Sean [name] denies any current stressors at this time. He reports that his sleep has improved since starting some of his medications, however he does want to become too dependent on the medication to help him sleep. He states that exercise has been a good avenue of stress relief for him. He also reports having a good support system made up by his parents, kids, and girlfriend. Sean [name] PHQ score decreased from a 9 to a 3! Continues ti stay compliant with medications. Exercise is helping  him  take his mind off of things. Will continue to monitor. Sean [name] is doing well mentally.  His biggest stressor is his health.  He is driving some and was encouraged to drive himself to rehab.  He is sleeping well.  He is still getting SOB whihch limits what he can do.     Expected Outcomes Short: Continue to exercise for mental boost. Long: Maintain positive outlook. Short: Continue exercise for mental health Long: Continue to maintain positive attitude Short; Continue to exercise more to help with breathing Long: Conitnue to stay positiv e     Interventions Encouraged to attend Pulmonary Rehabilitation for the exercise Encouraged to attend Pulmonary Rehabilitation for the exercise Encouraged to attend Pulmonary Rehabilitation for the exercise     Continue Psychosocial Services  Follow up required by staff Follow up required by staff Follow up required by staff              Psychosocial Discharge (Final Psychosocial Re-Evaluation):  Psychosocial Re-Evaluation - 08/12/22 1042       Psychosocial Re-Evaluation   Current issues with Current Psychotropic Meds    Comments Sean [name] is doing well mentally.  His biggest stressor is his health.  He is driving some and was encouraged to drive himself to rehab.  He is sleeping well.  He is still getting SOB whihch limits what he can do.    Expected Outcomes Short; Continue to exercise more to help with breathing Long: Conitnue to stay positiv e    Interventions Encouraged to attend Pulmonary Rehabilitation for the exercise    Continue Psychosocial Services  Follow up required by staff             Education: Education Goals: Education classes will be provided on a weekly basis, covering required topics. Participant will state understanding/return demonstration of topics presented.  Learning Barriers/Preferences:   General Pulmonary Education Topics:  Infection Prevention: - Provides verbal and written material to individual with discussion of  infection control including proper hand washing and proper equipment cleaning during exercise session. Flowsheet Row Pulmonary Rehab from 09/02/2022 in [facility] Cardiac and Pulmonary Rehab  Date 06/02/22  Educator [facility]  Instruction Review Code 1- Verbalizes Understanding       Falls Prevention: - Provides verbal and written material to individual with discussion of falls prevention and safety. Flowsheet Row Pulmonary Rehab from 09/02/2022 in [facility] Cardiac and Pulmonary Rehab  Date 06/02/22  Educator [facility]  Instruction Review Code 1- Verbalizes Understanding       Chronic Lung Disease Review: - Group verbal instruction with posters, models, PowerPoint presentations and videos,  to review new updates, new respiratory medications, new advancements in procedures and treatments. Providing information on websites and "800" numbers for continued self-education. Includes information about supplement oxygen, available portable oxygen systems, continuous and intermittent flow rates, oxygen safety, concentrators, and Medicare reimbursement for oxygen. Explanation of Pulmonary Drugs, including class, frequency, complications, importance of spacers, rinsing mouth after steroid MDI's, and proper cleaning methods for nebulizers. Review of basic lung anatomy and physiology related to function, structure, and complications of lung disease. Review of risk factors. Discussion about methods for diagnosing sleep apnea and types of masks and machines for OSA. Includes a review of the use of types of environmental controls: home humidity, furnaces, filters, dust mite/pet prevention, HEPA vacuums. Discussion about weather changes, air quality and the benefits of nasal washing. Instruction on Warning signs, infection symptoms, calling MD promptly, preventive modes, and value of vaccinations. Review of effective airway  clearance, coughing and/or vibration techniques. Emphasizing that all should Create an Action Plan. Written material  given at graduation. Flowsheet Row Pulmonary Rehab from 09/02/2022 in [facility] Cardiac and Pulmonary Rehab  Date 06/17/22  Educator [facility]  Instruction Review Code 1- Verbalizes Understanding       AED/CPR: - Group verbal and written instruction with the use of models to demonstrate the basic use of the AED with the basic ABC's of resuscitation.    Anatomy and Cardiac Procedures: - Group verbal and visual presentation and models provide information about basic cardiac anatomy and function. Reviews the testing methods done to diagnose heart disease and the outcomes of the test results. Describes the treatment choices: Medical Management, Angioplasty, or Coronary Bypass Surgery for treating various heart conditions including Myocardial Infarction, Angina, Valve Disease, and Cardiac Arrhythmias.  Written material given at graduation.   Medication Safety: - Group verbal and visual instruction to review commonly prescribed medications for heart and lung disease. Reviews the medication, class of the drug, and side effects. Includes the steps to properly store meds and maintain the prescription regimen.  Written material given at graduation.   Other: -Provides group and verbal instruction on various topics (see comments)   Knowledge Questionnaire Score:  Knowledge Questionnaire Score - 06/08/22 0939       Knowledge Questionnaire Score   Pre Score 13/16              Core Components/Risk Factors/Patient Goals at Admission:  Personal Goals and Risk Factors at Admission - 06/02/22 1135       Core Components/Risk Factors/Patient Goals on Admission    Weight Management Yes;Weight Gain    Intervention Weight Management: Develop a combined nutrition and exercise program designed to reach desired caloric intake, while maintaining appropriate intake of nutrient and fiber, sodium and fats, and appropriate energy expenditure required for the weight goal.;Weight Management: Provide education and  appropriate resources to help participant work on and attain dietary goals.    Admit Weight 153 lb 8 oz (69.6 kg)    Goal Weight: Short Term 160 lb (72.6 kg)    Goal Weight: Long Term 165 lb (74.8 kg)    Expected Outcomes Long Term: Adherence to nutrition and physical activity/exercise program aimed toward attainment of established weight goal;Short Term: Continue to assess and modify interventions until short term weight is achieved;Weight Gain: Understanding of general recommendations for a high calorie, high protein meal plan that promotes weight gain by distributing calorie intake throughout the day with the consumption for 4-5 meals, snacks, and/or supplements;Weight Maintenance: Understanding of the daily nutrition guidelines, which includes 25-35% calories from fat, 7% or less cal from saturated fats, less than 200mg  cholesterol, less than 1.5gm of sodium, & 5 or more servings of fruits and vegetables daily    Improve shortness of breath with ADL's Yes    Intervention Provide education, individualized exercise plan and daily activity instruction to help decrease symptoms of SOB with activities of daily living.    Expected Outcomes Short Term: Improve cardiorespiratory fitness to achieve a reduction of symptoms when performing ADLs;Long Term: Be able to perform more ADLs without symptoms or delay the onset of symptoms    Increase knowledge of respiratory medications and ability to use respiratory devices properly  Yes    Intervention Provide education and demonstration as needed of appropriate use of medications, inhalers, and oxygen therapy.    Expected Outcomes Short Term: Achieves understanding of medications use. Understands that oxygen is a medication prescribed by  physician. Demonstrates appropriate use of inhaler and oxygen therapy.;Long Term: Maintain appropriate use of medications, inhalers, and oxygen therapy.    Diabetes Yes    Intervention Provide education about signs/symptoms and  action to take for hypo/hyperglycemia.;Provide education about proper nutrition, including hydration, and aerobic/resistive exercise prescription along with prescribed medications to achieve blood glucose in normal ranges: Fasting glucose 65-99 mg/dL    Expected Outcomes Short Term: Participant verbalizes understanding of the signs/symptoms and immediate care of hyper/hypoglycemia, proper foot care and importance of medication, aerobic/resistive exercise and nutrition plan for blood glucose control.;Long Term: Attainment of HbA1C < 7%.    Hypertension Yes    Intervention Provide education on lifestyle modifcations including regular physical activity/exercise, weight management, moderate sodium restriction and increased consumption of fresh fruit, vegetables, and low fat dairy, alcohol moderation, and smoking cessation.;Monitor prescription use compliance.    Expected Outcomes Short Term: Continued assessment and intervention until BP is < 140/80mm HG in hypertensive participants. < 130/54mm HG in hypertensive participants with diabetes, heart failure or chronic kidney disease.;Long Term: Maintenance of blood pressure at goal levels.             Education:Diabetes - Individual verbal and written instruction to review signs/symptoms of diabetes, desired ranges of glucose level fasting, after meals and with exercise. Acknowledge that pre and post exercise glucose checks will be done for 3 sessions at entry of program. Flowsheet Row Pulmonary Rehab from 09/02/2022 in [facility] Cardiac and Pulmonary Rehab  Date 05/25/22  Educator [facility]  Instruction Review Code 1- Verbalizes Understanding       Know Your Numbers and Heart Failure: - Group verbal and visual instruction to discuss disease risk factors for cardiac and pulmonary disease and treatment options.  Reviews associated critical values for Overweight/Obesity, Hypertension, Cholesterol, and Diabetes.  Discusses basics of heart failure: signs/symptoms and  treatments.  Introduces Heart Failure Zone chart for action plan for heart failure.  Written material given at graduation. Flowsheet Row Pulmonary Rehab from 09/02/2022 in [facility] Cardiac and Pulmonary Rehab  Date 06/10/22  Educator [facility]  Instruction Review Code 1- Verbalizes Understanding       Core Components/Risk Factors/Patient Goals Review:   Goals and Risk Factor Review     Row Name 06/17/22 [phone]             Core Components/Risk Factors/Patient Goals Review   Personal Goals Review Weight Management/Obesity;Diabetes;Hypertension       Review Sean [name] states that he wants to gain some weight and has begun to eat more to meet his weight goal. He would like to gain about 20 lbs with a weight goal of 180 lb. He has also been checking his blood sugars and reports that they are within normal ranges. He does not own a BP cuff so he has not been checking his blood pressures at home. His BP readings have been within normal ranges while at rehab.       Expected Outcomes Short: Continue to monitor blood sugars. Long: Continue to work towards weight goal.                Core Components/Risk Factors/Patient Goals at Discharge (Final Review):   Goals and Risk Factor Review - 06/17/22 0956       Core Components/Risk Factors/Patient Goals Review   Personal Goals Review Weight Management/Obesity;Diabetes;Hypertension    Review Sean [name] states that he wants to gain some weight and has begun to eat more to meet his weight goal. He would like to gain about  20 lbs with a weight goal of 180 lb. He has also been checking his blood sugars and reports that they are within normal ranges. He does not own a BP cuff so he has not been checking his blood pressures at home. His BP readings have been within normal ranges while at rehab.    Expected Outcomes Short: Continue to monitor blood sugars. Long: Continue to work towards weight goal.             ITP Comments:  ITP Comments     Row Name 05/25/22  1323 06/02/22 1119 06/03/22 1122 06/28/22 1333 06/30/22 0815   ITP Comments Initial phone call completed. Diagnosis can be found in [facility] 2/5. EP Orientation scheduled for Wednesday, 2/21 at 2:30. Completed and gym orientation. Initial ITP created and sent for review to Dr. Jinny Sanders, Medical Director.  He will return his paperwork on his first day. First full day of exercise!  Patient was oriented to gym and equipment including functions, settings, policies, and procedures.  Patient's individual exercise prescription and treatment plan were reviewed.  All starting workloads were established based on the results of the 6 minute walk test done at initial orientation visit.  The plan for exercise progression was also introduced and progression will be customized based on patient's performance and goals. Completed Initial RD Consultation 30 Day review completed. Medical Director ITP review done, changes made as directed, and signed approval by Medical Director.    Row Name 07/27/22 [phone] 07/28/22 0841 08/25/22 0823 09/16/22 1412 09/22/22 0830   ITP Comments Patient's mother called to let us know that Khi is having a procedure done tomorrow and has prep today.  He will also be out again on Thursday.  He has not attended since 07/06/22 for various reasons.  We have not been able to assess for goals this round. 30 day review completed. ITP sent to Dr. Jinny Sanders, Medical Director of  Pulmonary Rehab. Continue with ITP unless changes are made by physician. 30 Day review completed. Medical Director ITP review done, changes made as directed, and signed approval by Medical Director. Pt's mom called this morning and requested for him to come this afternoon.  Pt did not show for appt, called to check on him, he had just woken up thinking appt was tomorrow.  He did reschedule class for afternoon starting on Monday.  If he does not attend Monday, we will not be able to assess his goals.  We also discussed  needing to improve his attendance in order to retain his spot in rehab. 30 Day review completed. Medical Director ITP review done, changes made as directed, and signed approval by Medical Director.    Row Name 10/07/22 1516 10/13/22 1603 10/19/22 1408       ITP Comments Called and spoke with pt. He was discharge from hospital 6/21 for pleural effusion and tachycardia. Per discharge note pt clear to restart pulmonary rehab. Pt states he is feeling better and will plan to attend his next scheduled session in rehab Monday 7/1. Pt no show for pulmonary rehab 6/27, 7/1 and 7/3. Letter mailed to pt. Discharge date set for 10/29/22. Pt made aware. 30 Day review completed. Medical Director ITP review done, changes made as directed, and signed approval by Medical Director.   out since5/23              Comments: Discharge ITP

## 2022-10-20 NOTE — Telephone Encounter (Signed)
Pt's mother called today requesting that we remove pt's appointments for pulmonary rehab as he is not able to attend at this time. Pt's mother will let us know if he will be able to attend at a later time. Pt discharged today 7/10.

## 2022-10-21 ENCOUNTER — Ambulatory Visit: Payer: Medicaid Other

## 2022-10-25 ENCOUNTER — Ambulatory Visit: Payer: Medicaid Other

## 2022-10-27 ENCOUNTER — Ambulatory Visit: Payer: Medicaid Other

## 2022-10-28 ENCOUNTER — Ambulatory Visit: Payer: Medicaid Other

## 2022-11-01 ENCOUNTER — Ambulatory Visit: Payer: Medicaid Other

## 2022-11-03 ENCOUNTER — Ambulatory Visit: Payer: Medicaid Other

## 2022-11-04 ENCOUNTER — Ambulatory Visit: Payer: Medicaid Other

## 2022-11-08 ENCOUNTER — Ambulatory Visit: Payer: Medicaid Other

## 2022-11-10 ENCOUNTER — Ambulatory Visit: Payer: Medicaid Other

## 2022-11-11 ENCOUNTER — Ambulatory Visit: Payer: Medicaid Other

## 2022-11-15 ENCOUNTER — Ambulatory Visit: Payer: Medicaid Other

## 2023-04-04 NOTE — Progress Notes (Signed)
Not an appt

## 2023-10-30 IMAGING — CT NM PET TUM IMG INITIAL (PI) SKULL BASE T - THIGH
6 series · 25 of 25 positions shown · non-contrast
Comparison: Chest CT 07/25/2021

CLINICAL DATA: Initial treatment strategy for failure to thrive.
Bilateral pulmonary infiltrates. Unexplained weight loss. History of
immunosuppressive therapy.

EXAM:
NUCLEAR MEDICINE PET SKULL BASE TO THIGH
TECHNIQUE: 9.05 mCi F-18 FDG was injected intravenously. Full-ring PET imaging
was performed from the skull base to thigh after the radiotracer. CT
data was obtained and used for attenuation correction and anatomic
localization.
Fasting blood glucose: 210 mg/dl

[Series 2: ct slices · axial · 3.8mm · 1.37mm/px · z∈[-989,-14]mm · 6 of 292 slices shown]
[im 1/292]
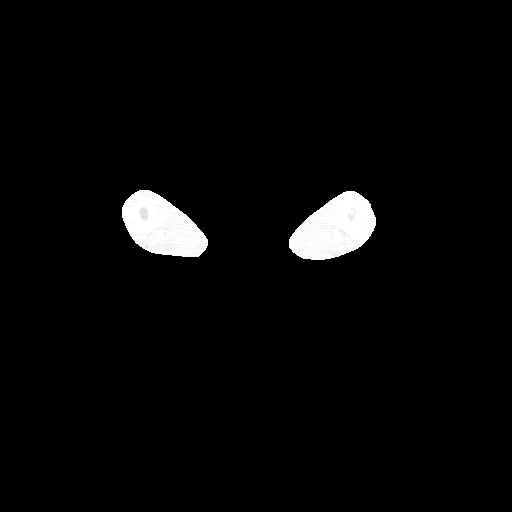
[im 59/292  brain]
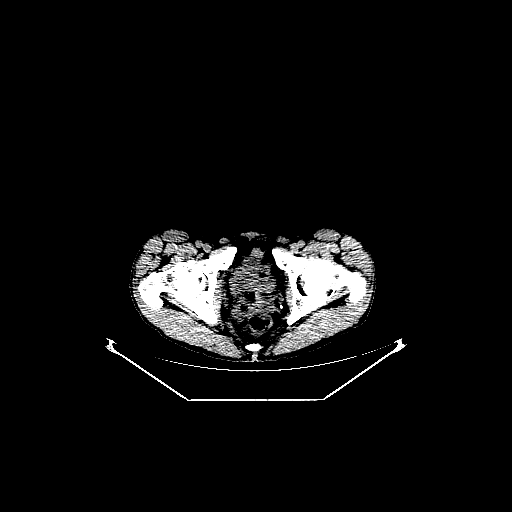
[im 117/292  brain]
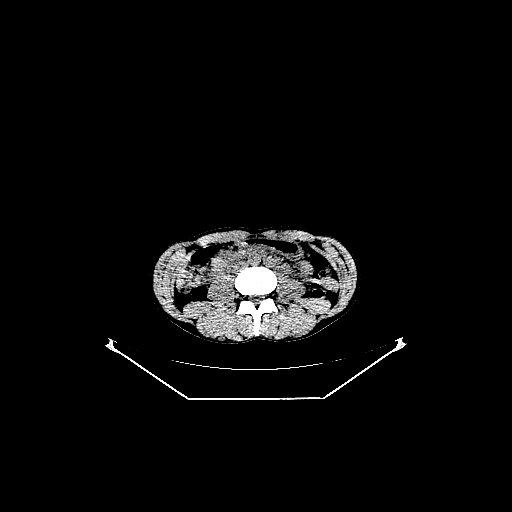
[im 175/292]
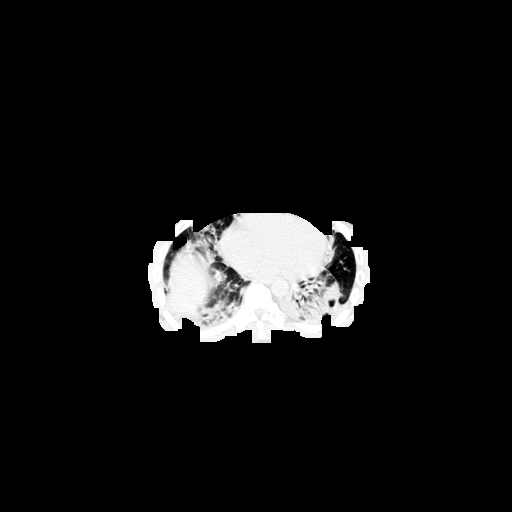
[im 233/292  brain]
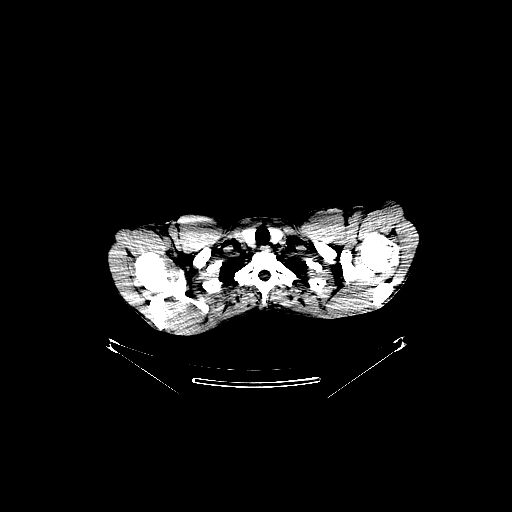
[im 292/292]
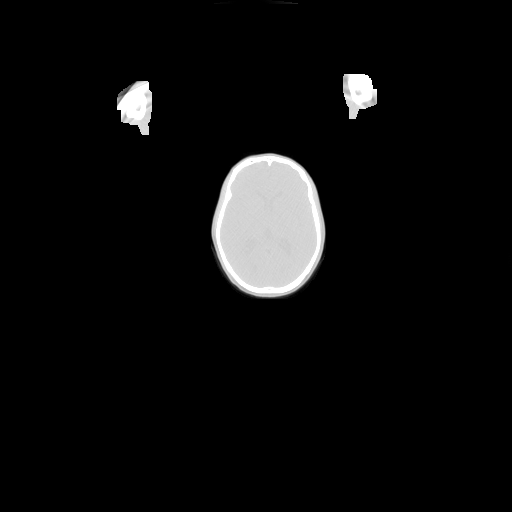

[Series 3: pet ac 3d body · axial · 3.3mm · 5.47mm/px · z∈[-989,-15]mm · 5 of 299 slices shown]
[im 1/299]
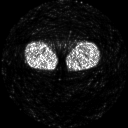
[im 75/299]
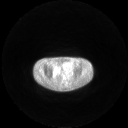
[im 150/299]
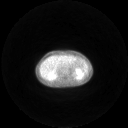
[im 224/299]
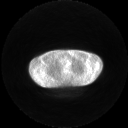
[im 299/299]
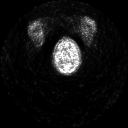

[Series 4: pet nac 3d body · axial · 3.3mm · 5.47mm/px · z∈[-989,-15]mm · 5 of 299 slices shown]
[im 1/299]
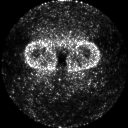
[im 75/299]
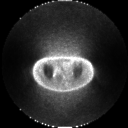
[im 150/299]
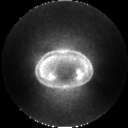
[im 224/299]
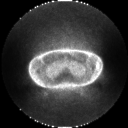
[im 299/299]
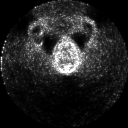

[Series 303: pet axial · axial · 3.3mm · 5.47mm/px · z∈[-989,-15]mm · 5 of 299 slices shown]
[im 1/299]
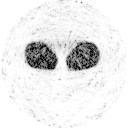
[im 75/299]
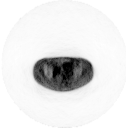
[im 150/299]
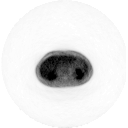
[im 224/299]
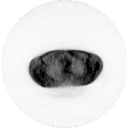
[im 299/299]
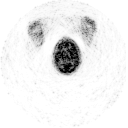

[Series 304: pet sagittal · sagittal · 5.5mm · 7.82mm/px · 2 of 92 slices shown]
[im 1/92]
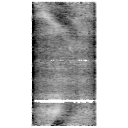
[im 92/92]
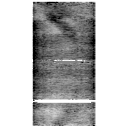

[Series 305: pet coronal · coronal · 5.5mm · 7.82mm/px · 2 of 90 slices shown]
[im 1/90]
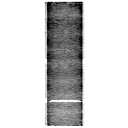
[im 90/90]
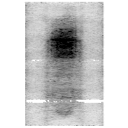

[25 of 25 positions shown; findings below may reference images not displayed]

FINDINGS: Mediastinal blood pool activity: SUV max

Liver activity: SUV max NA

NECK: No hypermetabolic lymph nodes in the neck.

Incidental CT findings: none

CHEST: No hypermetabolic mediastinal or hilar nodes. No suspicious
pulmonary nodules on the CT scan.

Incidental CT findings: Persistent left pleural effusion and
bilateral pulmonary infiltrates. Stable prominent mediastinal and
hilar lymph nodes likely reactive/hyperplastic.

ABDOMEN/PELVIS: No abnormal hypermetabolic activity within the
liver, pancreas, adrenal glands, or spleen. No hypermetabolic lymph
nodes in the abdomen or pelvis.

Incidental CT findings: none

SKELETON: No focal hypermetabolic activity to suggest skeletal
metastasis.

Incidental CT findings: none
IMPRESSION: Negative PET-CT for hypermetabolic neoplastic process.

Persistent left pleural effusion, bilateral filtrates and reactive
mediastinal and hilar adenopathy.

## 2023-11-21 LAB — PROTEIN / CREATININE RATIO, URINE
Albumin, U: 17
Creatinine, Urine: 223

## 2023-11-21 LAB — MICROALBUMIN / CREATININE URINE RATIO: Microalb Creat Ratio: 74

## 2024-01-14 LAB — COMPREHENSIVE METABOLIC PANEL WITH GFR
Albumin: 3.1 — AB (ref 3.5–5.0)
Calcium: 8.9 (ref 8.7–10.7)
eGFR: 69

## 2024-01-14 LAB — BASIC METABOLIC PANEL WITH GFR
BUN: 21 (ref 4–21)
CO2: 25 — AB (ref 13–22)
Chloride: 104 (ref 99–108)
Creatinine: 1.3 (ref 0.6–1.3)
Glucose: 103
Potassium: 4.1 meq/L (ref 3.5–5.1)
Sodium: 138 (ref 137–147)

## 2024-01-14 LAB — HEPATIC FUNCTION PANEL
ALT: 35 U/L (ref 10–40)
AST: 47 — AB (ref 14–40)
Alkaline Phosphatase: 43 (ref 25–125)
Bilirubin, Total: 0.2

## 2024-01-14 LAB — CBC AND DIFFERENTIAL
HCT: 44 (ref 41–53)
Hemoglobin: 14.1 (ref 13.5–17.5)
Platelets: 298 K/uL (ref 150–400)
WBC: 3.5

## 2024-02-20 ENCOUNTER — Encounter: Payer: Self-pay | Admitting: Internal Medicine

## 2024-02-20 ENCOUNTER — Ambulatory Visit (INDEPENDENT_AMBULATORY_CARE_PROVIDER_SITE_OTHER): Payer: Self-pay | Admitting: Internal Medicine

## 2024-02-20 VITALS — BP 126/86 | HR 90 | Ht 77.0 in | Wt 207.4 lb

## 2024-02-20 DIAGNOSIS — M359 Systemic involvement of connective tissue, unspecified: Secondary | ICD-10-CM

## 2024-02-20 DIAGNOSIS — E1159 Type 2 diabetes mellitus with other circulatory complications: Secondary | ICD-10-CM

## 2024-02-20 DIAGNOSIS — R0602 Shortness of breath: Secondary | ICD-10-CM

## 2024-02-20 DIAGNOSIS — E119 Type 2 diabetes mellitus without complications: Secondary | ICD-10-CM | POA: Insufficient documentation

## 2024-02-20 DIAGNOSIS — R070 Pain in throat: Secondary | ICD-10-CM | POA: Diagnosis not present

## 2024-02-20 DIAGNOSIS — I2721 Secondary pulmonary arterial hypertension: Secondary | ICD-10-CM

## 2024-02-20 DIAGNOSIS — Z7960 Long term (current) use of unspecified immunomodulators and immunosuppressants: Secondary | ICD-10-CM

## 2024-02-20 DIAGNOSIS — R059 Cough, unspecified: Secondary | ICD-10-CM

## 2024-02-20 DIAGNOSIS — J069 Acute upper respiratory infection, unspecified: Secondary | ICD-10-CM | POA: Insufficient documentation

## 2024-02-20 DIAGNOSIS — M0510 Rheumatoid lung disease with rheumatoid arthritis of unspecified site: Secondary | ICD-10-CM

## 2024-02-20 DIAGNOSIS — B009 Herpesviral infection, unspecified: Secondary | ICD-10-CM | POA: Insufficient documentation

## 2024-02-20 DIAGNOSIS — I152 Hypertension secondary to endocrine disorders: Secondary | ICD-10-CM | POA: Insufficient documentation

## 2024-02-20 DIAGNOSIS — I519 Heart disease, unspecified: Secondary | ICD-10-CM

## 2024-02-20 DIAGNOSIS — M3313 Other dermatomyositis without myopathy: Secondary | ICD-10-CM

## 2024-02-20 DIAGNOSIS — K223 Perforation of esophagus: Secondary | ICD-10-CM

## 2024-02-20 DIAGNOSIS — I7300 Raynaud's syndrome without gangrene: Secondary | ICD-10-CM | POA: Insufficient documentation

## 2024-02-20 DIAGNOSIS — J849 Interstitial pulmonary disease, unspecified: Secondary | ICD-10-CM

## 2024-02-20 NOTE — Progress Notes (Unsigned)
 New Patient Office Visit  Subjective   Patient ID: Sean [name], male    DOB: October 09, 1975  Age: 48 y.o. MRN: [id number]  CC:  Chief Complaint  Patient presents with   Establish Care    NPE    HPI LANKFORD GUTZMER presents to establish care Previous Primary Care provider/office: Dr. Weyman  he does have additional concerns to discuss today.   Patient here to establish care with our office as PCP. Overall he is doing well. Has PMH that includes: DM now controlled with diet used to be on insulin , HTN, HLD, interstitial lung disease, Raynaud's, eczema, dermatomycosis, hx of pleural effusions, RA, hx esophageal perforation. Patient is followed closely by Pulmonology, Rheumatology, Dermatology, Cardiology.  He reports not checking his blood sugars as he lost his machine. He reports he has been off insulin  for over a year since he is not on as many steroids as he previously was for his interstitial lung disease. Patient reports he is due for his diabetic eye exam but is already scheduled on 06/2024 at [facility].  He is not currently on a statin therapy but is considered high risk due to diabetes. Discussed starting statin therapy but patient does not want to do that at this time.  He reports colon cancer screening has been completed in the last year at [facility] and it was normal. He is due for labs but Patient reports [name] manages all his labs and is scheduled for those next week. Will extract UAC data from [name]. Patient prefers to only get blood work done at [facility].   Patient would like flu shot today but due to feeling sick will defer until symptoms resolve.  He reports head cold today of nasal congestion and cough that started 4-5 days ago. Also endorses scratchy throat. Clear drainage. He has been taking Dayquil and Nyquil for his symptoms and reports it was helping but he stopped taking that yesterday. He reports his shortness of breath has been worse the last few days but he has  been using his inhaler as prescribed. Denies fever and chills. Denies seasonal allergies. Will test for covid, flu, strep. FU if it does not improve or his symptoms worsen.    Outpatient Encounter Medications as of 02/20/2024  Medication Sig   albuterol  (VENTOLIN  HFA) 108 (90 Base) MCG/ACT inhaler Inhale 2 puffs into the lungs every 6 (six) hours as needed for wheezing or shortness of breath.   Cholecalciferol (VITAMIN D -1000 MAX ST) 25 MCG (1000 UT) tablet Take by mouth.   clobetasol ointment (TEMOVATE) 0.05 % Apply topically.   cyanocobalamin (VITAMIN B12) 1000 MCG tablet Take 1,000 mcg by mouth daily.   hydroxychloroquine  (PLAQUENIL ) 200 MG tablet Place 1 tablet (200 mg total) into feeding tube 2 (two) times daily.   Multiple Vitamins-Minerals (SUPER THERA VITE M) TABS Take 1 tablet by mouth daily.   OPSUMIT 10 MG tablet Take 10 mg by mouth daily.   predniSONE  (DELTASONE ) 5 MG tablet Take 5 mg by mouth daily.   sildenafil (REVATIO) 20 MG tablet Take 20 mg by mouth 3 (three) times daily.   traMADol  (ULTRAM ) 50 MG tablet Take 50 mg by mouth daily as needed.   Treprostinil (TYVASO) 0.6 MG/ML SOLN Inhale 18 mcg into the lungs 4 (four) times daily.   triamcinolone cream (KENALOG) 0.1 % Apply topically 2 (two) times daily.   BREZTRI  AEROSPHERE 160-9-4.8 MCG/ACT AERO Inhale 2 puffs into the lungs 2 (two) times daily. (Patient not taking: Reported  on 02/20/2024)   [DISCONTINUED] acetaminophen  (TYLENOL ) 500 MG tablet Take by mouth. (Patient not taking: Reported on 02/20/2024)   [DISCONTINUED] blood glucose meter kit and supplies KIT Dispense based on patient and insurance preference. Use up to four times daily as directed. (Patient not taking: Reported on 02/20/2024)   [DISCONTINUED] blood glucose meter kit and supplies Dispense based on patient and insurance preference. Use up to four times daily as directed. (FOR ICD-10 E10.9, E11.9). (Patient not taking: Reported on 02/20/2024)   [DISCONTINUED]  Blood Glucose Monitoring Suppl (CONTOUR NEXT ONE) KIT 4 (four) times daily. (Patient not taking: Reported on 02/20/2024)   [DISCONTINUED] chlorhexidine  (PERIDEX ) 0.12 % solution 15 mLs 2 (two) times daily. (Patient not taking: Reported on 02/20/2024)   [DISCONTINUED] furosemide  (LASIX ) 20 MG tablet Take 1 tablet (20 mg total) by mouth daily. (Patient not taking: Reported on 02/20/2024)   [DISCONTINUED] insulin  aspart (NOVOLOG ) 100 UNIT/ML FlexPen Inject 1-9 Units into the skin every 6 (six) hours. CBG 70 - 120: 0 units CBG 121 - 150: 1 unit CBG 151 - 200: 2 units CBG 201 - 250: 3 units CBG 251 - 300: 5 units CBG 301 - 350: 7 units CBG 351 - 400: 9 units CBG > 400: call MD and obtain STAT lab verification (Patient not taking: Reported on 02/20/2024)   [DISCONTINUED] Insulin  Pen Needle (PEN NEEDLES 3/16) 31G X 5 MM MISC 1 pen. by Does not apply route every 6 (six) hours. (Patient not taking: Reported on 02/20/2024)   [DISCONTINUED] lidocaine  (XYLOCAINE ) 2 % solution Use as directed 15 mLs in the mouth or throat every 6 (six) hours as needed (mouth/throat pain). (Patient not taking: Reported on 02/20/2024)   [DISCONTINUED] NONFORMULARY OR COMPOUNDED ITEM LIDOCAINE /MAALOX XS 1:1 (Patient not taking: Reported on 02/20/2024)   [DISCONTINUED] pantoprazole  (PROTONIX ) 40 MG tablet Take 1 tablet (40 mg total) by mouth daily. (Patient not taking: Reported on 02/20/2024)   [DISCONTINUED] tacrolimus  (PROTOPIC ) 0.1 % ointment Apply topically 2 (two) times daily. (Patient not taking: Reported on 02/20/2024)   [DISCONTINUED] Vitamin D  12.5 MCG/0.25ML LIQD Give 1 mL by tube daily. (Patient not taking: Reported on 02/20/2024)   No facility-administered encounter medications on file as of 02/20/2024.    Past Medical History:  Diagnosis Date   H/O immunosuppressive therapy    Hyperpigmentation of skin    Hyperpigmentation rash/vesicles of the palm and face   Neutropenia    RA (rheumatoid arthritis) (HCC)      Past Surgical History:  Procedure Laterality Date   ESOPHAGOGASTRODUODENOSCOPY N/A 09/03/2021   Procedure: ESOPHAGOGASTRODUODENOSCOPY (EGD);  Surgeon: Maryruth Ole DASEN, MD;  Location: [facility] ENDOSCOPY;  Service: Endoscopy;  Laterality: N/A;   IR GASTROSTOMY TUBE MOD SED  09/11/2021    Family History  Problem Relation Age of Onset   Hyperlipidemia Mother    Hypertension Father     Social History   Socioeconomic History   Marital status: Single    Spouse name: Not on file   Number of children: Not on file   Years of education: Not on file   Highest education level: Not on file  Occupational History   Not on file  Tobacco Use   Smoking status: Former    Types: Cigarettes   Smokeless tobacco: Never  Vaping Use   Vaping status: Never Used  Substance and Sexual Activity   Alcohol use: Not Currently   Drug use: Not Currently    Types: Marijuana   Sexual activity: Not on file  Other Topics  Concern   Not on file  Social History Narrative   Not on file   Social Drivers of Health   Financial Resource Strain: Low Risk  (09/28/2022)   Received from [facility]   Overall Financial Resource Strain (CARDIA)    Difficulty of Paying Living Expenses: Not hard at all  Food Insecurity: No Food Insecurity (09/28/2022)   Received from [facility]   Hunger Vital Sign    Within the past 12 months, you worried that your food would run out before you got the money to buy more.: Never true    Within the past 12 months, the food you bought just didn't last and you didn't have money to get more.: Never true  Transportation Needs: No Transportation Needs (09/28/2022)   Received from [facility] - Transportation    In the past 12 months, has lack of transportation kept you from medical appointments or from getting medications?: No    Lack of Transportation (Non-Medical): No  Physical Activity: Not on file  Stress: Not on file   Social Connections: Unknown (10/14/2021)   Received from [facility]   Social Network    Social Network: Not on file  Intimate Partner Violence: Unknown (10/14/2021)   Received from [facility]   HITS    Physically Hurt: Not on file    Insult or Talk Down To: Not on file    Threaten Physical Harm: Not on file    Scream or Curse: Not on file    Review of Systems  Constitutional: Negative.  Negative for chills, fever and malaise/fatigue.  HENT:  Positive for congestion. Negative for sore throat.   Eyes: Negative.  Negative for blurred vision and pain.  Respiratory:  Positive for cough and shortness of breath.   Cardiovascular: Negative.  Negative for chest pain, palpitations and leg swelling.  Gastrointestinal: Negative.  Negative for abdominal pain, blood in stool, constipation, diarrhea, heartburn, melena, nausea and vomiting.  Genitourinary: Negative.  Negative for dysuria, flank pain, frequency and urgency.  Musculoskeletal: Negative.  Negative for joint pain and myalgias.  Skin: Negative.   Neurological: Negative.  Negative for dizziness, tingling, sensory change, weakness and headaches.  Endo/Heme/Allergies: Negative.   Psychiatric/Behavioral: Negative.  Negative for depression and suicidal ideas. The patient is not nervous/anxious.         Objective   BP 126/86   Pulse 90   Ht 6' 5 (1.956 m)   Wt 207 lb 6.4 oz (94.1 kg)   SpO2 95%   BMI 24.59 kg/m   Physical Exam Vitals and nursing note reviewed.  Constitutional:      General: He is not in acute distress.    Appearance: Normal appearance. He is not ill-appearing.  HENT:     Head: Normocephalic and atraumatic.     Nose: Nose normal.     Mouth/Throat:     Mouth: Mucous membranes are moist.     Pharynx: Oropharynx is clear.  Eyes:     Conjunctiva/sclera: Conjunctivae normal.     Pupils: Pupils are equal, round, and reactive to light.  Cardiovascular:     Rate and Rhythm: Normal rate and regular rhythm.      Pulses: Normal pulses.     Heart sounds: Normal heart sounds.  Pulmonary:     Effort: Pulmonary effort is normal.     Breath sounds: Normal breath sounds. No wheezing or rhonchi.  Abdominal:     General: Bowel  sounds are normal. There is no distension.     Palpations: Abdomen is soft.     Tenderness: There is no abdominal tenderness.  Musculoskeletal:        General: Normal range of motion.     Cervical back: Normal range of motion and neck supple.     Right lower leg: No edema.     Left lower leg: No edema.  Skin:    General: Skin is warm and dry.     Capillary Refill: Capillary refill takes less than 2 seconds.  Neurological:     General: No focal deficit present.     Mental Status: He is alert and oriented to person, place, and time.     Sensory: No sensory deficit.     Motor: No weakness.  Psychiatric:        Mood and Affect: Mood normal.        Behavior: Behavior normal.        Judgment: Judgment normal.        Assessment & Plan:  FU with specialists as recommended. Patient due for labs at [facility] provider. Check for covid, flu, strep.  Flu and strep were negative. Covid pending. Continue medications as prescribed. 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)   Relevant Medications   OPSUMIT 10 MG tablet   sildenafil (REVATIO) 20 MG tablet   Treprostinil (TYVASO) 0.6 MG/ML SOLN   Right ventricular dysfunction   Relevant Medications   OPSUMIT 10 MG tablet   sildenafil (REVATIO) 20 MG tablet   Treprostinil  (TYVASO) 0.6 MG/ML SOLN   Acute upper respiratory infection   Relevant Orders   Novel Coronavirus, NAA ([facility])   POC Influenza A&B ([facility] test)   DM II (diabetes mellitus, type II), controlled (HCC)   Hypertension associated with diabetes (HCC) - Primary   Relevant Medications   OPSUMIT 10 MG tablet   sildenafil (REVATIO) 20 MG tablet   Treprostinil (TYVASO) 0.6 MG/ML SOLN   Raynaud's disease without gangrene   Relevant Medications   OPSUMIT 10 MG tablet   sildenafil (REVATIO) 20 MG tablet   Treprostinil (TYVASO) 0.6 MG/ML SOLN    Return in about 4 months (around 06/19/2024).   Total time spent: 45 minutes. This time includes review of previous notes and results and patient face to face interaction during today's visit.    FERNAND FREDY RAMAN, MD  02/20/2024   This document may have been prepared by [facility] [employer] Voice Recognition software and as such may include unintentional dictation errors.

## 2024-02-21 ENCOUNTER — Ambulatory Visit: Payer: Self-pay | Admitting: Internal Medicine

## 2024-02-21 LAB — POC INFLUENZA A&B (BINAX/QUICKVUE)
Influenza A, POC: NEGATIVE
Influenza B, POC: NEGATIVE

## 2024-02-21 LAB — NOVEL CORONAVIRUS, NAA: SARS-CoV-2, NAA: NOT DETECTED

## 2024-02-24 NOTE — Progress Notes (Signed)
 Pt informed, feeling better has appt with [facility] on Monday

## 2024-06-19 ENCOUNTER — Ambulatory Visit: Admitting: Internal Medicine
